# Patient Record
Sex: Male | Born: 1960 | Race: Black or African American | Hispanic: No | Marital: Married | Smoking: Former smoker
Health system: Southern US, Community
[De-identification: ages and names within clinical notes are randomized; demographics above are authoritative.]

## PROBLEM LIST (undated history)

## (undated) DIAGNOSIS — I4719 Other supraventricular tachycardia: Secondary | ICD-10-CM

## (undated) DIAGNOSIS — I471 Supraventricular tachycardia: Secondary | ICD-10-CM

## (undated) DIAGNOSIS — F1410 Cocaine abuse, uncomplicated: Secondary | ICD-10-CM

## (undated) DIAGNOSIS — M1990 Unspecified osteoarthritis, unspecified site: Secondary | ICD-10-CM

## (undated) DIAGNOSIS — I509 Heart failure, unspecified: Secondary | ICD-10-CM

## (undated) DIAGNOSIS — D649 Anemia, unspecified: Secondary | ICD-10-CM

## (undated) DIAGNOSIS — R519 Headache, unspecified: Secondary | ICD-10-CM

## (undated) DIAGNOSIS — F329 Major depressive disorder, single episode, unspecified: Secondary | ICD-10-CM

## (undated) DIAGNOSIS — R1031 Right lower quadrant pain: Secondary | ICD-10-CM

## (undated) DIAGNOSIS — K219 Gastro-esophageal reflux disease without esophagitis: Secondary | ICD-10-CM

## (undated) DIAGNOSIS — Z915 Personal history of self-harm: Secondary | ICD-10-CM

## (undated) DIAGNOSIS — R0602 Shortness of breath: Secondary | ICD-10-CM

## (undated) DIAGNOSIS — F209 Schizophrenia, unspecified: Secondary | ICD-10-CM

## (undated) DIAGNOSIS — F419 Anxiety disorder, unspecified: Secondary | ICD-10-CM

## (undated) DIAGNOSIS — R1032 Left lower quadrant pain: Secondary | ICD-10-CM

## (undated) DIAGNOSIS — F319 Bipolar disorder, unspecified: Secondary | ICD-10-CM

## (undated) DIAGNOSIS — I10 Essential (primary) hypertension: Secondary | ICD-10-CM

## (undated) DIAGNOSIS — I38 Endocarditis, valve unspecified: Secondary | ICD-10-CM

## (undated) DIAGNOSIS — N189 Chronic kidney disease, unspecified: Secondary | ICD-10-CM

## (undated) DIAGNOSIS — R51 Headache: Secondary | ICD-10-CM

## (undated) DIAGNOSIS — K3184 Gastroparesis: Secondary | ICD-10-CM

## (undated) DIAGNOSIS — L299 Pruritus, unspecified: Secondary | ICD-10-CM

## (undated) DIAGNOSIS — R42 Dizziness and giddiness: Secondary | ICD-10-CM

## (undated) DIAGNOSIS — G629 Polyneuropathy, unspecified: Secondary | ICD-10-CM

## (undated) DIAGNOSIS — F32A Depression, unspecified: Secondary | ICD-10-CM

## (undated) DIAGNOSIS — E785 Hyperlipidemia, unspecified: Secondary | ICD-10-CM

## (undated) HISTORY — DX: Headache, unspecified: R51.9

## (undated) HISTORY — DX: Schizophrenia, unspecified: F20.9

## (undated) HISTORY — DX: Bipolar disorder, unspecified: F31.9

## (undated) HISTORY — DX: Headache: R51

## (undated) HISTORY — DX: Cocaine abuse, uncomplicated: F14.10

## (undated) HISTORY — DX: Hyperlipidemia, unspecified: E78.5

## (undated) HISTORY — PX: CARDIAC VALVE REPLACEMENT: SHX585

## (undated) HISTORY — PX: LACERATION REPAIR: SHX5168

## (undated) HISTORY — DX: Essential (primary) hypertension: I10

---

## 1996-11-19 DIAGNOSIS — Z9151 Personal history of suicidal behavior: Secondary | ICD-10-CM

## 1996-11-19 HISTORY — DX: Personal history of suicidal behavior: Z91.51

## 1998-03-31 ENCOUNTER — Emergency Department (HOSPITAL_COMMUNITY): Admission: EM | Admit: 1998-03-31 | Discharge: 1998-03-31 | Payer: Self-pay | Admitting: Emergency Medicine

## 1999-01-24 ENCOUNTER — Emergency Department (HOSPITAL_COMMUNITY): Admission: EM | Admit: 1999-01-24 | Discharge: 1999-01-24 | Payer: Self-pay | Admitting: Emergency Medicine

## 1999-01-24 ENCOUNTER — Encounter: Payer: Self-pay | Admitting: Emergency Medicine

## 1999-02-16 ENCOUNTER — Emergency Department (HOSPITAL_COMMUNITY): Admission: EM | Admit: 1999-02-16 | Discharge: 1999-02-16 | Payer: Self-pay | Admitting: Internal Medicine

## 1999-04-03 ENCOUNTER — Emergency Department (HOSPITAL_COMMUNITY): Admission: EM | Admit: 1999-04-03 | Discharge: 1999-04-04 | Payer: Self-pay | Admitting: Emergency Medicine

## 2002-04-02 ENCOUNTER — Emergency Department (HOSPITAL_COMMUNITY): Admission: EM | Admit: 2002-04-02 | Discharge: 2002-04-02 | Payer: Self-pay | Admitting: Emergency Medicine

## 2002-04-02 ENCOUNTER — Encounter: Payer: Self-pay | Admitting: Emergency Medicine

## 2002-04-08 ENCOUNTER — Encounter: Payer: Self-pay | Admitting: Emergency Medicine

## 2002-04-08 ENCOUNTER — Inpatient Hospital Stay (HOSPITAL_COMMUNITY): Admission: EM | Admit: 2002-04-08 | Discharge: 2002-04-15 | Payer: Self-pay | Admitting: Psychiatry

## 2002-04-28 ENCOUNTER — Inpatient Hospital Stay (HOSPITAL_COMMUNITY): Admission: EM | Admit: 2002-04-28 | Discharge: 2002-04-29 | Payer: Self-pay | Admitting: Emergency Medicine

## 2002-08-15 ENCOUNTER — Emergency Department (HOSPITAL_COMMUNITY): Admission: EM | Admit: 2002-08-15 | Discharge: 2002-08-15 | Payer: Self-pay | Admitting: Emergency Medicine

## 2002-08-20 ENCOUNTER — Encounter: Admission: RE | Admit: 2002-08-20 | Discharge: 2002-08-20 | Payer: Self-pay | Admitting: Internal Medicine

## 2002-08-21 ENCOUNTER — Encounter: Admission: RE | Admit: 2002-08-21 | Discharge: 2002-08-21 | Payer: Self-pay | Admitting: Internal Medicine

## 2002-08-21 ENCOUNTER — Ambulatory Visit (HOSPITAL_COMMUNITY): Admission: RE | Admit: 2002-08-21 | Discharge: 2002-08-21 | Payer: Self-pay | Admitting: Internal Medicine

## 2002-08-24 ENCOUNTER — Encounter: Admission: RE | Admit: 2002-08-24 | Discharge: 2002-08-24 | Payer: Self-pay | Admitting: Internal Medicine

## 2004-01-10 ENCOUNTER — Emergency Department (HOSPITAL_COMMUNITY): Admission: EM | Admit: 2004-01-10 | Discharge: 2004-01-11 | Payer: Self-pay | Admitting: Emergency Medicine

## 2004-04-20 ENCOUNTER — Encounter: Admission: RE | Admit: 2004-04-20 | Discharge: 2004-04-20 | Payer: Self-pay | Admitting: Internal Medicine

## 2005-10-26 ENCOUNTER — Emergency Department (HOSPITAL_COMMUNITY): Admission: EM | Admit: 2005-10-26 | Discharge: 2005-10-26 | Payer: Self-pay | Admitting: Emergency Medicine

## 2005-11-05 ENCOUNTER — Emergency Department (HOSPITAL_COMMUNITY): Admission: EM | Admit: 2005-11-05 | Discharge: 2005-11-05 | Payer: Self-pay | Admitting: Emergency Medicine

## 2007-10-22 ENCOUNTER — Emergency Department (HOSPITAL_COMMUNITY): Admission: EM | Admit: 2007-10-22 | Discharge: 2007-10-22 | Payer: Self-pay | Admitting: Emergency Medicine

## 2007-12-01 ENCOUNTER — Emergency Department (HOSPITAL_COMMUNITY): Admission: EM | Admit: 2007-12-01 | Discharge: 2007-12-01 | Payer: Self-pay | Admitting: Emergency Medicine

## 2009-09-30 ENCOUNTER — Emergency Department (HOSPITAL_COMMUNITY): Admission: EM | Admit: 2009-09-30 | Discharge: 2009-09-30 | Payer: Self-pay | Admitting: Emergency Medicine

## 2010-01-30 ENCOUNTER — Emergency Department (HOSPITAL_COMMUNITY): Admission: EM | Admit: 2010-01-30 | Discharge: 2010-01-30 | Payer: Self-pay | Admitting: Emergency Medicine

## 2010-05-19 HISTORY — PX: MITRAL VALVE REPAIR: SHX2039

## 2010-05-24 ENCOUNTER — Encounter (INDEPENDENT_AMBULATORY_CARE_PROVIDER_SITE_OTHER): Payer: Self-pay | Admitting: Internal Medicine

## 2010-05-24 ENCOUNTER — Ambulatory Visit: Payer: Self-pay | Admitting: Cardiology

## 2010-05-24 ENCOUNTER — Inpatient Hospital Stay (HOSPITAL_COMMUNITY): Admission: EM | Admit: 2010-05-24 | Discharge: 2010-06-03 | Payer: Self-pay | Admitting: Emergency Medicine

## 2010-05-26 ENCOUNTER — Ambulatory Visit: Payer: Self-pay | Admitting: Cardiothoracic Surgery

## 2010-05-26 ENCOUNTER — Encounter: Payer: Self-pay | Admitting: Cardiovascular Disease

## 2010-05-26 ENCOUNTER — Ambulatory Visit: Payer: Self-pay | Admitting: Cardiovascular Disease

## 2010-05-26 HISTORY — PX: CARDIAC CATHETERIZATION: SHX172

## 2010-05-27 ENCOUNTER — Encounter: Payer: Self-pay | Admitting: Cardiothoracic Surgery

## 2010-05-30 ENCOUNTER — Ambulatory Visit: Payer: Self-pay | Admitting: Vascular Surgery

## 2010-05-30 ENCOUNTER — Encounter: Payer: Self-pay | Admitting: Cardiothoracic Surgery

## 2010-05-30 ENCOUNTER — Encounter: Payer: Self-pay | Admitting: Cardiovascular Disease

## 2010-06-03 ENCOUNTER — Encounter: Payer: Self-pay | Admitting: Cardiology

## 2010-06-06 ENCOUNTER — Emergency Department (HOSPITAL_COMMUNITY): Admission: EM | Admit: 2010-06-06 | Discharge: 2010-06-06 | Payer: Self-pay | Admitting: Emergency Medicine

## 2010-06-12 ENCOUNTER — Telehealth: Payer: Self-pay | Admitting: Cardiovascular Disease

## 2010-06-14 DIAGNOSIS — I10 Essential (primary) hypertension: Secondary | ICD-10-CM

## 2010-06-14 DIAGNOSIS — F209 Schizophrenia, unspecified: Secondary | ICD-10-CM | POA: Insufficient documentation

## 2010-06-14 DIAGNOSIS — R011 Cardiac murmur, unspecified: Secondary | ICD-10-CM | POA: Insufficient documentation

## 2010-06-14 DIAGNOSIS — I059 Rheumatic mitral valve disease, unspecified: Secondary | ICD-10-CM | POA: Insufficient documentation

## 2010-06-14 DIAGNOSIS — F319 Bipolar disorder, unspecified: Secondary | ICD-10-CM | POA: Insufficient documentation

## 2010-06-15 ENCOUNTER — Encounter: Payer: Self-pay | Admitting: Cardiovascular Disease

## 2010-06-21 ENCOUNTER — Ambulatory Visit: Payer: Self-pay | Admitting: Cardiothoracic Surgery

## 2010-06-21 ENCOUNTER — Encounter: Admission: RE | Admit: 2010-06-21 | Discharge: 2010-06-21 | Payer: Self-pay | Admitting: Cardiothoracic Surgery

## 2010-06-21 ENCOUNTER — Encounter: Payer: Self-pay | Admitting: Cardiovascular Disease

## 2010-06-22 ENCOUNTER — Encounter: Payer: Self-pay | Admitting: Cardiovascular Disease

## 2010-06-28 ENCOUNTER — Ambulatory Visit: Payer: Self-pay | Admitting: Cardiology

## 2010-06-28 ENCOUNTER — Encounter: Payer: Self-pay | Admitting: Physician Assistant

## 2010-06-28 LAB — CONVERTED CEMR LAB
BUN: 31 mg/dL — ABNORMAL HIGH (ref 6–23)
CO2: 26 meq/L (ref 19–32)
Calcium: 9.1 mg/dL (ref 8.4–10.5)
Chloride: 106 meq/L (ref 96–112)
Creatinine, Ser: 2.5 mg/dL — ABNORMAL HIGH (ref 0.4–1.5)
GFR calc non Af Amer: 35.31 mL/min (ref >60)
Glucose, Bld: 99 mg/dL (ref 70–99)
Potassium: 5.5 meq/L — ABNORMAL HIGH (ref 3.5–5.1)
Sodium: 139 meq/L (ref 135–145)

## 2010-06-29 ENCOUNTER — Ambulatory Visit: Payer: Self-pay | Admitting: Cardiovascular Disease

## 2010-07-07 LAB — CONVERTED CEMR LAB
BUN: 30 mg/dL — ABNORMAL HIGH (ref 6–23)
CO2: 25 meq/L (ref 19–32)
Calcium: 8.6 mg/dL (ref 8.4–10.5)
Chloride: 107 meq/L (ref 96–112)
Creatinine, Ser: 2.2 mg/dL — ABNORMAL HIGH (ref 0.4–1.5)
GFR calc non Af Amer: 41.77 mL/min (ref >60)
Glucose, Bld: 99 mg/dL (ref 70–99)
Potassium: 4.1 meq/L (ref 3.5–5.1)
Sodium: 141 meq/L (ref 135–145)

## 2010-07-19 ENCOUNTER — Ambulatory Visit: Payer: Self-pay | Admitting: Cardiothoracic Surgery

## 2010-07-27 ENCOUNTER — Ambulatory Visit (HOSPITAL_COMMUNITY): Admission: RE | Admit: 2010-07-27 | Discharge: 2010-07-27 | Payer: Self-pay | Admitting: Cardiothoracic Surgery

## 2010-07-27 ENCOUNTER — Ambulatory Visit: Payer: Self-pay | Admitting: Cardiology

## 2010-07-27 ENCOUNTER — Encounter: Payer: Self-pay | Admitting: Cardiothoracic Surgery

## 2010-08-09 ENCOUNTER — Encounter: Payer: Self-pay | Admitting: Cardiovascular Disease

## 2010-08-09 ENCOUNTER — Telehealth (INDEPENDENT_AMBULATORY_CARE_PROVIDER_SITE_OTHER): Payer: Self-pay | Admitting: *Deleted

## 2010-08-09 ENCOUNTER — Ambulatory Visit: Payer: Self-pay | Admitting: Cardiothoracic Surgery

## 2010-08-24 ENCOUNTER — Ambulatory Visit: Payer: Self-pay | Admitting: Cardiovascular Disease

## 2010-09-04 ENCOUNTER — Encounter: Payer: Self-pay | Admitting: Cardiovascular Disease

## 2010-09-11 ENCOUNTER — Encounter: Payer: Self-pay | Admitting: Cardiovascular Disease

## 2010-09-21 ENCOUNTER — Ambulatory Visit: Payer: Self-pay | Admitting: Cardiology

## 2010-09-21 ENCOUNTER — Encounter: Payer: Self-pay | Admitting: Cardiology

## 2010-09-21 ENCOUNTER — Inpatient Hospital Stay (HOSPITAL_COMMUNITY): Admission: EM | Admit: 2010-09-21 | Discharge: 2010-09-23 | Payer: Self-pay | Admitting: Emergency Medicine

## 2010-09-22 ENCOUNTER — Encounter: Payer: Self-pay | Admitting: Cardiovascular Disease

## 2010-09-22 ENCOUNTER — Encounter: Payer: Self-pay | Admitting: Cardiology

## 2010-09-27 ENCOUNTER — Encounter: Payer: Self-pay | Admitting: Cardiovascular Disease

## 2010-10-15 ENCOUNTER — Emergency Department (HOSPITAL_COMMUNITY)
Admission: EM | Admit: 2010-10-15 | Discharge: 2010-10-15 | Payer: Self-pay | Source: Home / Self Care | Admitting: Emergency Medicine

## 2010-10-16 ENCOUNTER — Encounter: Payer: Self-pay | Admitting: Physician Assistant

## 2010-10-16 ENCOUNTER — Ambulatory Visit: Payer: Self-pay | Admitting: Cardiology

## 2010-10-16 DIAGNOSIS — Z9189 Other specified personal risk factors, not elsewhere classified: Secondary | ICD-10-CM | POA: Insufficient documentation

## 2010-10-16 DIAGNOSIS — N039 Chronic nephritic syndrome with unspecified morphologic changes: Secondary | ICD-10-CM

## 2010-10-16 DIAGNOSIS — D631 Anemia in chronic kidney disease: Secondary | ICD-10-CM | POA: Insufficient documentation

## 2010-10-16 DIAGNOSIS — I5033 Acute on chronic diastolic (congestive) heart failure: Secondary | ICD-10-CM

## 2010-10-16 DIAGNOSIS — R319 Hematuria, unspecified: Secondary | ICD-10-CM

## 2010-10-25 ENCOUNTER — Ambulatory Visit: Payer: Self-pay | Admitting: Cardiothoracic Surgery

## 2010-10-26 ENCOUNTER — Emergency Department (HOSPITAL_COMMUNITY): Admission: EM | Admit: 2010-10-26 | Discharge: 2010-03-06 | Payer: Self-pay | Admitting: Emergency Medicine

## 2010-11-08 ENCOUNTER — Ambulatory Visit: Payer: Self-pay | Admitting: Cardiovascular Disease

## 2010-11-15 ENCOUNTER — Encounter: Payer: Self-pay | Admitting: Cardiovascular Disease

## 2010-11-15 ENCOUNTER — Ambulatory Visit: Payer: Self-pay | Admitting: Cardiothoracic Surgery

## 2010-11-16 ENCOUNTER — Ambulatory Visit: Payer: Self-pay | Admitting: Cardiovascular Disease

## 2010-11-22 ENCOUNTER — Encounter (HOSPITAL_COMMUNITY)
Admission: RE | Admit: 2010-11-22 | Discharge: 2010-12-19 | Payer: Self-pay | Source: Home / Self Care | Attending: Nephrology | Admitting: Nephrology

## 2010-11-22 LAB — POCT HEMOGLOBIN-HEMACUE: Hemoglobin: 8.2 g/dL — ABNORMAL LOW (ref 13.0–17.0)

## 2010-12-05 ENCOUNTER — Encounter: Payer: Self-pay | Admitting: Cardiovascular Disease

## 2010-12-06 LAB — POCT HEMOGLOBIN-HEMACUE: Hemoglobin: 8.7 g/dL — ABNORMAL LOW (ref 13.0–17.0)

## 2010-12-18 ENCOUNTER — Encounter: Payer: Self-pay | Admitting: Cardiology

## 2010-12-21 NOTE — Progress Notes (Signed)
Summary: Questions about pain medications  Phone Note Call from Patient Call back at [phone]   Caller: Spouse/Karen Summary of Call: Pt calling to speak with someone about pain medication Initial call taken by: Judie Grieve,  June 12, 2010 8:53 AM  Follow-up for Phone Call        PT'S WIFE INSTRUCTED TO CALL SURGEON'S OFF FOR PAIN MED REFILL. Follow-up by: Scherrie Bateman, LPN,  June 12, 2010 9:22 AM

## 2010-12-21 NOTE — Letter (Signed)
Summary: [facility] Visit   [facility] Visit   Imported By: Roderic Ovens 09/05/2010 11:24:29  _____________________________________________________________________  External Attachment:    Type:   Image     Comment:   External Document

## 2010-12-21 NOTE — Assessment & Plan Note (Signed)
Summary: eph per night message/dx:chf/mvr/lg   Visit Type:  Follow-up Primary Rhiley Tarver:  None  CC:  shortness of breath.  History of Present Illness: This is a 50 year old African American male patient who has history of chest pain in the setting of cocaine abuse. He also had an admission November 3 through the fifth 2001 with acute on chronic diastolic heart failure and acute on chronic renal failure for which renal saw him. He was transfused one unit of blood for chronic anemia and was sent home for outpatient renal followup. He has not been seen yet by them.  The patient went to [facility] emergency room yesterday with cough, congestion and blood in his urine. They wanted to admit him for transfusion and renal evaluation but he signed out AMA.    The patient is quite weak he gets short of breath if he goes up a flight of stairs and has to stop before he continues on. He denies orthopnea paroxysmal nocturnal dyspnea, dizziness or presyncope. His hemoglobin in the emergency room was 6.8 hematocrit 19, BUN 45, creatinine 4.01 which was improved from 4.9. His BNP was elevated at 1250. They started him on tetracycline. He says his cough and hematuria has improved.    Current Medications (verified): 1)  Aspirin 81 Mg Tbec (Aspirin) .... Take One Tablet By Mouth Daily 2)  Metoprolol Tartrate 50 Mg Tabs (Metoprolol Tartrate) .... Take One Tablet By Mouth Twice A Day 3)  Multivitamins  Tabs (Multiple Vitamin) .... Take 1 Tablet By Mouth Once A Day 4)  Amlodipine Besylate 5 Mg Tabs (Amlodipine Besylate) .... Take One Tablet By Mouth Daily 5)  Hydralazine Hcl 10 Mg Tabs (Hydralazine Hcl) .... Take One Tablet By Mouth Three Times A Day 6)  Furosemide 40 Mg Tabs (Furosemide) .... Take One Tablet By Mouth Daily. 7)  Abilify 5 Mg Tabs (Aripiprazole) .... Take 1 Tablet By Mouth Once A Day 8)  Fish Oil   Oil (Fish Oil) .... Two Times A Day 9)  Vitamin C 500 Mg  Tabs (Ascorbic Acid) .... Take 1 Tablet  By Mouth Once A Day 10)  Doxycycline Hyclate 100 Mg Caps (Doxycycline Hyclate) .... Take 1 Tablet By Mouth Two Times A Day  Allergies (verified): 1)  ! * Strawberries  Past History:  Past Medical History: Last updated: 08/24/2010 1. Hypertension.   2. Bipolar.   3. Schizophrenia, with history of hospitalized inpatient therapy at       the [facility].   4. History of mitral valve prolapse with severe MR    Family History: Last updated: 06/14/2010 Is negative for valvular heart disease or coronary   artery disease.   Review of Systems       see history of present illness.denies any recurrent cocaine or drug abuse  Vital Signs:  Patient profile:   50 year old male Height:      66 inches Weight:      147 pounds BMI:     23.81 Pulse rate:   71 / minute BP sitting:   130 / 86  (left arm) Cuff size:   regular  Vitals Entered By: Hardin Negus, RMA (October 16, 2010 10:22 AM)  Physical Exam  General:  Thin, in no acute distress. Neck: Increased JVD, no HJR, Bruit, or thyroid enlargement Lungs: Decreased breaths sounds throughout,No tachypnea, clear without wheezing, rales, or rhonchi Cardiovascular: RRR, PMI not displaced, 2-3/6 systolic murmur at the left sternal border and apex, no bruit, thrill, or heave.  Abdomen: BS normal. Soft without organomegaly, masses, lesions or tenderness. Extremities: left groin without hematoma hemorrhage, without cyanosis, clubbing or edema. Good distal pulses bilateral SKin: Warm, no lesions or rashes  Musculoskeletal: No deformities Neuro: no focal signs    Impression & Recommendations:  Problem # 1:  ACUTE ON CHRONIC DIASTOLIC HEART FAILURE (ICD-428.33) Pt does have some fluid build up today his BNP was 1250 yesterday at [facility]. His lungs are clear but his neck veins are elevated. I asked him to take an extra Lasix today. The following medications were removed from the medication list:    Lisinopril 20 Mg Tabs  (Lisinopril) .Marland Kitchen... Take one tablet by mouth daily    Hydrochlorothiazide 25 Mg Tabs (Hydrochlorothiazide) .Marland Kitchen... Take one tablet by mouth daily. His updated medication list for this problem includes:    Aspirin 81 Mg Tbec (Aspirin) .Marland Kitchen... Take one tablet by mouth daily    Metoprolol Tartrate 50 Mg Tabs (Metoprolol tartrate) .Marland Kitchen... Take one tablet by mouth twice a day    Amlodipine Besylate 5 Mg Tabs (Amlodipine besylate) .Marland Kitchen... Take one tablet by mouth daily    Furosemide 40 Mg Tabs (Furosemide) .Marland Kitchen... Take one tablet by mouth daily.  Problem # 2:  ANEMIA IN CHRONIC KIDNEY DISEASE (ICD-285.21) Patient's hemoglobin was 6.8 yesterday. It was 7.0 when he left the hospital after one unit of blood transfusion. He is quite weak but denies any dizziness or orthostatic symptoms. I offered him hospitalization for transfusion and treatment of his heart failure and kidney disease. He would like to see Dr. Arrie Aran first.I spoke with his office and he was beat with him this afternoon to give him an appointment.  Problem # 3:  HEMATURIA UNSPECIFIED (ICD-599.70) Patient had hematuria on urinalysis yesterday as well as mucus in his urine. He was started on tetracycline  Problem # 4:  COCAINE ABUSE, HX OF (ICD-V15.9)  Patient had chest pain in setting of recent cocaine abuse September 29, 2010. He denies any further cocaine use.  Problem # 5:  MITRAL VALVE DISORDERS (ICD-424.0) Patient had valvular heart disease status post mitral valve ring in July 2001 with persistent moderate mitral regurgitation by TEE September 23, 2010 The following medications were removed from the medication list:    Lisinopril 20 Mg Tabs (Lisinopril) .Marland Kitchen... Take one tablet by mouth daily    Hydrochlorothiazide 25 Mg Tabs (Hydrochlorothiazide) .Marland Kitchen... Take one tablet by mouth daily. His updated medication list for this problem includes:    Metoprolol Tartrate 50 Mg Tabs (Metoprolol tartrate) .Marland Kitchen... Take one tablet by mouth twice a day     Furosemide 40 Mg Tabs (Furosemide) .Marland Kitchen... Take one tablet by mouth daily.  Other Orders: EKG w/ Interpretation (93000)  Patient Instructions: 1)  Please take an extra Lasix 40mg  today 2)  Your physician recommends that you schedule a follow-up appointment in: 1 month with Dr.Cooper 3)  Dr. Hadley Pen office will call you today with an appointment  4)  [phone]

## 2010-12-21 NOTE — Miscellaneous (Signed)
Summary: [facility]   [facility]   Imported By: Roderic Ovens 11/07/2010 12:00:38  _____________________________________________________________________  External Attachment:    Type:   Image     Comment:   External Document

## 2010-12-21 NOTE — Letter (Signed)
Summary: [facility] Visit   [facility] Visit   Imported By: Roderic Ovens 08/04/2010 16:13:53  _____________________________________________________________________  External Attachment:    Type:   Image     Comment:   External Document

## 2010-12-21 NOTE — Miscellaneous (Signed)
Summary: [facility] Order[facility] Plan   [facility] Order[facility] Plan   Imported By: Roderic Ovens 06/16/2010 11:11:14  _____________________________________________________________________  External Attachment:    Type:   Image     Comment:   External Document

## 2010-12-21 NOTE — Assessment & Plan Note (Signed)
Summary: 2 month rov   Visit Type:  2 months follow up Primary Provider:  None  CC:  Some Sob.  History of Present Illness: This is a 50 year old African American male patient who underwent mitral valve repair with a 26 mm Edwards annuloplasty ring and release of posterior leaflet secondary to cords and closure of the cleft in the posterior leaflet May 30, 2010. He presented with congestive heart failure. Since surgery, his symptoms are much improved. He complains of exertional dyspnea with climbing stairs, but no symptoms with walking on level ground. He denies orthopnea or PND. No edema or other complaints at present.  He has been noted to have a residual MR murmur following surgery and post-op echo done September 8th showed at least moderate MR with an LVEF of 55%.  Current Medications (verified): 1)  Aspirin 81 Mg Tbec (Aspirin) .... Take One Tablet By Mouth Daily 2)  Guaifenesin 100 Mg/25ml Syrp (Guaifenesin) .... As Needed 3)  Metoprolol Tartrate 50 Mg Tabs (Metoprolol Tartrate) .... Take One Tablet By Mouth Twice A Day 4)  Multivitamins  Tabs (Multiple Vitamin) .... Take 1 Tablet By Mouth Once A Day 5)  Oxycodone Hcl 5 Mg Tabs (Oxycodone Hcl) .... As Needed 6)  Risperdal 3 Mg Tabs (Risperidone) .... Take 1 Tab By Mouth At Bedtime 7)  Trazamine 50 Mg Misc (Trazodone & Diet Manage Prod) .... As Needed 8)  Lisinopril 20 Mg Tabs (Lisinopril) .... Take One Tablet By Mouth Daily  Allergies: 1)  ! * Strawberries  Past History:  Past medical history reviewed for relevance to current acute and chronic problems.  Past Medical History: 1. Hypertension.   2. Bipolar.   3. Schizophrenia, with history of hospitalized inpatient therapy at       the [facility].   4. History of mitral valve prolapse with severe MR    Past Surgical History: Mitral Valve repair 2011     Review of Systems       Negative except as per HPI   Vital Signs:  Patient profile:   50 year old  male Height:      66 inches Weight:      146.75 pounds BMI:     23.77 Pulse rate:   76 / minute Pulse rhythm:   regular Resp:     18 per minute BP sitting:   150 / 100  (left arm) Cuff size:   large  Vitals Entered By: Vikki Ports (August 24, 2010 10:37 AM)  Physical Exam  General:  Pt is alert and oriented, in no acute distress. HEENT: normal Neck: normal carotid upstrokes without bruits, JVP normal Lungs: CTA CV: RRR with 2/6 holosystolic murmur at the LV apex Abd: soft, NT, positive BS, no bruit, no organomegaly Ext: no clubbing, cyanosis, or edema. peripheral pulses 2+ and equal Skin: warm and dry without rash    EKG  Procedure date:  07/27/2010  Findings:      Study Conclusions            - Left ventricle: The cavity size was normal. There was mild       concentric hypertrophy. Systolic function was normal. The       estimated ejection fraction was in the range of 50% to 55%. Wall       motion was normal; there were no regional wall motion       abnormalities. Doppler parameters are consistent with high       ventricular filling  pressure.     - Mitral valve: Prior procedures included surgical repair. Moderate       to severe regurgitation.     - Left atrium: The atrium was moderately dilated.     - Pericardium, extracardiac: A trivial pericardial effusion was       identified.     Impressions:            - At least moderate MR (difficult to quantitate); no significant MS       by pressure half time but elevated mean gradient of 13 mmHg);       suggest TEE to better assess.  Impression & Recommendations:  Problem # 1:  MITRAL VALVE DISORDERS (ICD-424.0) The pt is s/p MV repair with apparent significant residual MR (at least moderate). His physical exam is supportive of this as well. Will add HCTZ to improve BP control. He is not taking metoprolol as directed and he was instructed to take this twice daily instead of once daily. Hopefully we will achieve  better BP control with these changes. He now has NYHA Class 2 symptoms and clearly is better than he was before surgery. Will consider a TEE if his symptoms progress or if his physical exam changes.   The following medications were removed from the medication list:    Furosemide 40 Mg Tabs (Furosemide) .Marland Kitchen... Take one tablet by mouth daily. His updated medication list for this problem includes:    Metoprolol Tartrate 50 Mg Tabs (Metoprolol tartrate) .Marland Kitchen... Take one tablet by mouth twice a day    Lisinopril 20 Mg Tabs (Lisinopril) .Marland Kitchen... Take one tablet by mouth daily    Hydrochlorothiazide 25 Mg Tabs (Hydrochlorothiazide) .Marland Kitchen... Take one tablet by mouth daily.  Problem # 2:  HYPERTENSION (ICD-401.9) Poor control. Changes as above. Will f/u with a metabolic panel when he follows up in one month.  The following medications were removed from the medication list:    Furosemide 40 Mg Tabs (Furosemide) .Marland Kitchen... Take one tablet by mouth daily. His updated medication list for this problem includes:    Aspirin 81 Mg Tbec (Aspirin) .Marland Kitchen... Take one tablet by mouth daily    Metoprolol Tartrate 50 Mg Tabs (Metoprolol tartrate) .Marland Kitchen... Take one tablet by mouth twice a day    Lisinopril 20 Mg Tabs (Lisinopril) .Marland Kitchen... Take one tablet by mouth daily    Hydrochlorothiazide 25 Mg Tabs (Hydrochlorothiazide) .Marland Kitchen... Take one tablet by mouth daily.  Patient Instructions: 1)  Your physician recommends that you schedule a follow-up appointment in: 1 MONTH 2)  Your physician recommends that you return for lab work in: 1 MONTH (BMP 401.9, 424.0) 3)  Your physician has recommended you make the following change in your medication: START HCTZ 25mg  one tablet daily Prescriptions: HYDROCHLOROTHIAZIDE 25 MG TABS (HYDROCHLOROTHIAZIDE) Take one tablet by mouth daily.  #30 x 6   Entered by:   Julieta Gutting, RN, BSN   Authorized by:   Norva Karvonen, MD   Signed by:   Julieta Gutting, RN, BSN on 08/24/2010   Method used:    Electronically to        [id number]* (retail)       [address] [place], [place]  [address]       Ph: [phone]       Fax: [phone]   RxID:   [id number]

## 2010-12-21 NOTE — Letter (Signed)
[facility]   Imported By: Marylou Mccoy 10/10/2010 14:50:51  _____________________________________________________________________  External Attachment:    Type:   Image     Comment:   External Document

## 2010-12-21 NOTE — Progress Notes (Signed)
Summary: refill request  Phone Note Refill Request Message from:  Patient on August 09, 2010 11:44 AM  Refills Requested: Medication #1:  FOLIC ACID 1 MG TABS Take 1 tablet by mouth once a day [facility] [address]   Method Requested: Telephone to Pharmacy Initial call taken by: Glynda Jaeger,  August 09, 2010 11:45 AM  Follow-up for Phone Call        Rx faxed to pharmacy.Vikki Ports  August 10, 2010 2:46 PM      Prescriptions: FOLIC ACID 1 MG TABS (FOLIC ACID) Take 1 tablet by mouth once a day  #30 x 1   Entered by:   Vikki Ports   Authorized by:   Norva Karvonen, MD   Signed by:   Vikki Ports on 08/10/2010   Method used:   Faxed to ...       [facility] [address] #[id number]* (retail)       [address] [place], [place]  [address]       Ph: [phone]       Fax: [phone]   RxID:   [id number]

## 2010-12-21 NOTE — Assessment & Plan Note (Signed)
Summary: eph/ gd   Visit Type:  Post-hospital  CC:  no complains.  History of Present Illness: This is a 50 year old African American male patient who underwent mitral valve repair with a 26 mm Edwards annuloplasty ring and release of posterior leaflet secondary to cords and closure of the cleft in the posterior leaflet May 30, 2010. The patient had been having symptoms of congestive heart failure prior to this. Intraoperative transesophageal echo revealed slightly depressed function of the septum which improved after discontinuation of bypass. No segmental defects were noted. There was a trace to 1+ regurgitation flow and a central pattern.  The patient is doing quite well post surgery. He says his breathing is back to normal. He denies dyspnea dyspnea on exertion orthopnea paroxysmal nocturnal dyspnea. He is having mild discomfort in his chest from the surgery. He saw Dr. Morton Peters last week and was started on lisinopril for elevated blood pressure.  Current Medications (verified): 1)  Aspirin 81 Mg Tbec (Aspirin) .... Take One Tablet By Mouth Daily 2)  Folic Acid 1 Mg Tabs (Folic Acid) .... Take 1 Tablet By Mouth Once A Day 3)  Furosemide 40 Mg Tabs (Furosemide) .... Take One Tablet By Mouth Daily. 4)  Guaifenesin 100 Mg/36ml Syrp (Guaifenesin) .... As Needed 5)  Nu-Iron 150 Mg Caps (Polysaccharide Iron Complex) .... Take 1 Capsule By Mouth Once A Day 6)  Metoprolol Tartrate 50 Mg Tabs (Metoprolol Tartrate) .... Take One Tablet By Mouth Twice A Day 7)  Multivitamins  Tabs (Multiple Vitamin) .... Take 1 Tablet By Mouth Once A Day 8)  Oxycodone Hcl 5 Mg Tabs (Oxycodone Hcl) .... As Needed 9)  Potassium Chloride Crys Cr 20 Meq Cr-Tabs (Potassium Chloride Crys Cr) .... Take One Tablet By Mouth Daily 10)  Risperdal 3 Mg Tabs (Risperidone) .... Take 1 Tab By Mouth At Bedtime 11)  Trazamine 50 Mg Misc (Trazodone & Diet Manage Prod) .... As Needed 12)  Lisinopril 10 Mg Tabs (Lisinopril) .... Take  One Tablet By Mouth Daily  Allergies (verified): 1)  ! * Strawberries  Past History:  Past Medical History: Last updated: 06/14/2010 1. Hypertension.   2. Bipolar.   3. Schizophrenia, with history of hospitalized inpatient therapy at       the [facility].   4. History of mitral valve prolapse.   5. He had a cardiac murmur.   Review of Systems       see history of present illness. Patient is walking with a cane secondary to his chest pain  Vital Signs:  Patient profile:   50 year old male Height:      66 inches Weight:      145.75 pounds BMI:     23.61 Pulse rate:   77 / minute Pulse rhythm:   regular Resp:     18 per minute BP sitting:   160 / 90  (left arm) Cuff size:   large  Vitals Entered By: Vikki Ports (June 28, 2010 9:24 AM)  Physical Exam  General:   Well-nournished, in no acute distress. Neck: No JVD, HJR, Bruit, or thyroid enlargement Lungs: No tachypnea, clear without wheezing, rales, or rhonchi Cardiovascular:Incisions healing well RRR, PMI not displaced, 2/6 systolic murmur at the left sternal border and apex, no gallops, bruit, thrill, or heave. Abdomen: BS normal. Soft without organomegaly, masses, lesions or tenderness. Extremities: without cyanosis, clubbing or edema. Good distal pulses bilateral SKin: Warm, no lesions or rashes  Musculoskeletal: No deformities Neuro: no focal  signs    EKG  Procedure date:  06/28/2010  Findings:      normal sinus rhythm with LVH  Impression & Recommendations:  Problem # 1:  MITRAL VALVE PROLAPSE (ICD-424.0) Patient underwent mitral valve repair May 30, 2010 for severe mitral regurgitation class IV congestive heart failure. Patient is feeling 100% better. He has no symptoms of heart failure. He does have a systolic murmur. We may want to do a repeat echo when he sees Dr. Excell Seltzer back. I will hold off for now because he is asymptomatic.Patient would like to be considered for cardiac  rehabilitation but he has no insurance. I have made the referral. His updated medication list for this problem includes:    Furosemide 40 Mg Tabs (Furosemide) .Marland Kitchen... Take one tablet by mouth daily.    Metoprolol Tartrate 50 Mg Tabs (Metoprolol tartrate) .Marland Kitchen... Take one tablet by mouth twice a day    Lisinopril 20 Mg Tabs (Lisinopril) .Marland Kitchen... Take one tablet by mouth daily  Orders: EKG w/ Interpretation (93000) TLB-BMP (Basic Metabolic Panel-BMET) (80048-METABOL) Cardiac Rehabilitation (Cardiac Rehab)  Problem # 2:  HYPERTENSION (ICD-401.9) Blood pressure is still elevated today. I will increase his lisinopril to 20 mg daily. I considered stopping his Lasix, but will continue this for better blood pressure control. We will check a BMET today. His updated medication list for this problem includes:    Aspirin 81 Mg Tbec (Aspirin) .Marland Kitchen... Take one tablet by mouth daily    Furosemide 40 Mg Tabs (Furosemide) .Marland Kitchen... Take one tablet by mouth daily.    Metoprolol Tartrate 50 Mg Tabs (Metoprolol tartrate) .Marland Kitchen... Take one tablet by mouth twice a day    Lisinopril 20 Mg Tabs (Lisinopril) .Marland Kitchen... Take one tablet by mouth daily  Patient Instructions: 1)  Your physician recommends that you schedule a follow-up appointment in: 2 months with Dr. Excell Seltzer 2)  Your physician recommends referral and attendance at a Cardiac Rehab Program. Mitral valve repair. 3)  Your physician has recommended you make the following change in your medication: Lisinopril 20 mg take one tablet daily. 4)  Your physician recommends that you return for lab work [id number] today. Prescriptions: LISINOPRIL 20 MG TABS (LISINOPRIL) take one tablet by mouth daily  #30 x 6   Entered by:   Ollen Gross, RN, BSN   Authorized by:   Marletta Lor, PA-C   Signed by:   Ollen Gross, RN, BSN on 06/28/2010   Method used:   Electronically to        [id number]* (retail)       [address] [place], [place]  [address]        Ph: [phone]       Fax: [phone]   RxID:   [id number]

## 2010-12-21 NOTE — Letter (Signed)
Summary: [facility]   [facility]   Imported By: Roderic Ovens 08/04/2010 16:12:26  _____________________________________________________________________  External Attachment:    Type:   Image     Comment:   External Document

## 2010-12-21 NOTE — Letter (Signed)
Summary: [facility]  [facility]   Imported By: Marylou Mccoy 11/09/2010 11:53:07  _____________________________________________________________________  External Attachment:    Type:   Image     Comment:   External Document

## 2010-12-21 NOTE — Consult Note (Signed)
Summary: [facility] Referral Form   [facility] Referral Form   Imported By: Roderic Ovens 10/09/2010 15:05:26  _____________________________________________________________________  External Attachment:    Type:   Image     Comment:   External Document

## 2010-12-26 ENCOUNTER — Emergency Department (HOSPITAL_COMMUNITY)
Admission: EM | Admit: 2010-12-26 | Discharge: 2010-12-26 | Disposition: A | Discharge status: Home / Self Care | Payer: No Typology Code available for payment source | Attending: Emergency Medicine | Admitting: Emergency Medicine

## 2010-12-26 ENCOUNTER — Emergency Department (HOSPITAL_COMMUNITY): Payer: Self-pay

## 2010-12-26 DIAGNOSIS — M545 Low back pain, unspecified: Secondary | ICD-10-CM | POA: Insufficient documentation

## 2010-12-26 DIAGNOSIS — I10 Essential (primary) hypertension: Secondary | ICD-10-CM | POA: Insufficient documentation

## 2010-12-26 DIAGNOSIS — F319 Bipolar disorder, unspecified: Secondary | ICD-10-CM | POA: Insufficient documentation

## 2010-12-26 DIAGNOSIS — F17200 Nicotine dependence, unspecified, uncomplicated: Secondary | ICD-10-CM | POA: Insufficient documentation

## 2010-12-26 DIAGNOSIS — M542 Cervicalgia: Secondary | ICD-10-CM | POA: Insufficient documentation

## 2010-12-26 DIAGNOSIS — Y92410 Unspecified street and highway as the place of occurrence of the external cause: Secondary | ICD-10-CM | POA: Insufficient documentation

## 2011-01-04 ENCOUNTER — Emergency Department (HOSPITAL_COMMUNITY): Payer: Self-pay

## 2011-01-04 ENCOUNTER — Inpatient Hospital Stay (HOSPITAL_COMMUNITY)
Admission: EM | Admit: 2011-01-04 | Discharge: 2011-01-06 | DRG: 189 | Disposition: A | Discharge status: Home / Self Care | Payer: Self-pay | Attending: Internal Medicine | Admitting: Internal Medicine

## 2011-01-04 DIAGNOSIS — F17200 Nicotine dependence, unspecified, uncomplicated: Secondary | ICD-10-CM | POA: Diagnosis present

## 2011-01-04 DIAGNOSIS — J9819 Other pulmonary collapse: Secondary | ICD-10-CM | POA: Diagnosis present

## 2011-01-04 DIAGNOSIS — Z7982 Long term (current) use of aspirin: Secondary | ICD-10-CM

## 2011-01-04 DIAGNOSIS — F209 Schizophrenia, unspecified: Secondary | ICD-10-CM | POA: Diagnosis present

## 2011-01-04 DIAGNOSIS — Z9119 Patient's noncompliance with other medical treatment and regimen: Secondary | ICD-10-CM

## 2011-01-04 DIAGNOSIS — Z91199 Patient's noncompliance with other medical treatment and regimen due to unspecified reason: Secondary | ICD-10-CM

## 2011-01-04 DIAGNOSIS — F1410 Cocaine abuse, uncomplicated: Secondary | ICD-10-CM | POA: Diagnosis present

## 2011-01-04 DIAGNOSIS — J810 Acute pulmonary edema: Principal | ICD-10-CM | POA: Diagnosis present

## 2011-01-04 DIAGNOSIS — F1910 Other psychoactive substance abuse, uncomplicated: Secondary | ICD-10-CM | POA: Diagnosis present

## 2011-01-04 DIAGNOSIS — I517 Cardiomegaly: Secondary | ICD-10-CM | POA: Diagnosis present

## 2011-01-04 DIAGNOSIS — I129 Hypertensive chronic kidney disease with stage 1 through stage 4 chronic kidney disease, or unspecified chronic kidney disease: Secondary | ICD-10-CM | POA: Diagnosis present

## 2011-01-04 DIAGNOSIS — F319 Bipolar disorder, unspecified: Secondary | ICD-10-CM | POA: Diagnosis present

## 2011-01-04 DIAGNOSIS — N184 Chronic kidney disease, stage 4 (severe): Secondary | ICD-10-CM | POA: Diagnosis present

## 2011-01-04 LAB — POCT CARDIAC MARKERS
CKMB, poc: <1 ng/mL — ABNORMAL LOW (ref 1.0–8.0)
Myoglobin, poc: 174 ng/mL (ref 12–200)

## 2011-01-04 LAB — CBC
MCH: 29.5 pg (ref 26.0–34.0)
MCV: 90.5 fL (ref 78.0–100.0)
Platelets: 180 10*3/uL (ref 150–400)
RDW: 20.2 % — ABNORMAL HIGH (ref 11.5–15.5)

## 2011-01-04 LAB — CK TOTAL AND CKMB (NOT AT ARMC)
CK, MB: 2.5 ng/mL (ref 0.3–4.0)
Total CK: 339 U/L — ABNORMAL HIGH (ref 7–232)

## 2011-01-04 LAB — BRAIN NATRIURETIC PEPTIDE: Pro B Natriuretic peptide (BNP): 1840 pg/mL — ABNORMAL HIGH (ref 0.0–100.0)

## 2011-01-04 LAB — TROPONIN I: Troponin I: 0.04 ng/mL (ref 0.00–0.06)

## 2011-01-04 LAB — RAPID URINE DRUG SCREEN, HOSP PERFORMED: Benzodiazepines: NOT DETECTED

## 2011-01-04 LAB — MRSA PCR SCREENING: MRSA by PCR: NEGATIVE

## 2011-01-04 LAB — BASIC METABOLIC PANEL
BUN: 44 mg/dL — ABNORMAL HIGH (ref 6–23)
Creatinine, Ser: 3.9 mg/dL — ABNORMAL HIGH (ref 0.4–1.5)
GFR calc non Af Amer: 17 mL/min — ABNORMAL LOW (ref >60)

## 2011-01-04 LAB — CARDIAC PANEL(CRET KIN+CKTOT+MB+TROPI): Relative Index: 0.9 (ref 0.0–2.5)

## 2011-01-04 LAB — DIFFERENTIAL
Basophils Absolute: 0.1 10*3/uL (ref 0.0–0.1)
Basophils Relative: 1 % (ref 0–1)
Lymphocytes Relative: 9 % — ABNORMAL LOW (ref 12–46)
Monocytes Absolute: 0.8 10*3/uL (ref 0.1–1.0)
Neutro Abs: 6.4 10*3/uL (ref 1.7–7.7)
Neutrophils Relative %: 74 % (ref 43–77)

## 2011-01-04 NOTE — H&P (Signed)
Ryan Winters, Ryan Winters             ACCOUNT NO.:  [id number]  MEDICAL RECORD NO.:  [id number]           [place] TYPE:  E  LOCATION:  [facility]                         FACILITY:  [facility]  PHYSICIAN:  Charlestine Massed, MDDATE OF BIRTH:  11-03-1961  DATE OF ADMISSION:  01/04/2011 DATE OF DISCHARGE:                             HISTORY & PHYSICAL   PRIMARY CARE PHYSICIAN:  Mina Marble, MD  CARDIOLOGIST:  Noralyn Pick. Eden Emms, MD, [facility].  CARDIOTHORACIC SURGERY:  [name], MD  CHIEF COMPLAINT:  Shortness of breath worsening over Ryan past few days.  HISTORY OF PRESENT ILLNESS:  Ryan Winters is a 50 year old gentleman who had mitral valve annuloplasty on May 31, 2010 - mitral valve repair by Dr. Maudie Flakes of [facility] for significant mitral regurgitation. Ryan Winters is well known to have history of cocaine abuse and in Ryan last 2 admissions, his urine test has proved positive to be cocaine, but Ryan Winters falsifies his statements about his cocaine use and he says he has not used it in a long time.  He has known history of schizophrenia and bipolar disorder.  Ryan Winters states over Ryan last 5 days ago, he started having some congestion, cough with yellow sputum at which time he stopped taking his hypertension drugs and then he has been getting worsening shortness of breath.  When I asked him about, did he use cocaine, he did not answer anything.  It was very suspicious that Ryan Winters may be using cocaine which possibly could have triggered Ryan hypertensive crisis which he came in.  When he came, Ryan blood pressure was pretty high.  When he came, his blood pressure was 180/130 in Ryan emergency room with a heart rate of 102 also.  His heart rate was also slightly high.  Following that he was started on medications.  He was given labetalol push after Ryan blood pressure came down.  I requested Ryan ED physician to start nitroglycerin drip, but before that Ryan blood pressure came  down considerably.  Currently his blood pressure is running in 141/99 with a heart rate of 78.  He is currently very stable. His shortness of breath has improved as per him.  Even though Ryan Winters initially refused to stay, he later on considered and said that he will stay in Ryan hospital.  [facility] consulted to admit for hypertensive emergency with shortness of breath possibly due to pulmonary edema.  PAST HISTORY: 1. Mitral valve annuloplasty/repair in July 2011 for severe MR -- no     need for anticoagulation. 2. History of schizophrenia/bipolar disorder. 3. Polysubstance abuse and recurrent repeated cocaine abuser. 4. Hypertension. 5. Chronic kidney disease, stage IV.  ADMISSIONS:  Cardiac cath in July 2011 showing clear cors with no significant disease anywhere.  CURRENT MEDICATIONS:  Ryan Winters says he does not remember any of his medications but as per Ryan discharge summary in December 2011 by Dr. Maudie Flakes, he was on metoprolol, Risperdal, trazodone, folic acid, aspirin, Niferex, guaifenesin.  SOCIAL HISTORY:  Lives with his wife.  He uses cocaine repeatedly, smokes cigarettes continuously even now with 1-2 a day as per him  now. He lives with his wife.  He falsifies statements about his cocaine use, but has been tested positive for cocaine in Ryan last 2 visits.  FAMILY HISTORY:  No significant history of coronary artery disease, valvular heart disease, or diabetes.  ALLERGIES:  STRAWBERRIES, CASHEWS.  No drug allergies.  REVIEW OF SYSTEMS:  A 12 point system review unremarkable.  Positive pertinent features in history of present illness, negative otherwise.  PHYSICAL EXAMINATION:  VITAL SIGNS: Currently 141/99, heart rate 78, respirations range from 20-22, blood pressure is 141/98, temperature is 97.3, O2 sats are 99% on 2 liters oxygen. GENERAL:  Ryan Winters is alert, awake, answered questions well, not in any obvious distress.  Not significantly  tachypneic. HEAD AND NECK:  There is no JVD seen.  No bruits heard in Ryan neck.  No nodes.  But there is increased swelling in Ryan neck veins with hepatic pressure.  No bleeding in oral or nasal mucosa. CHEST:  Bilateral air entry is good.  Harsh breath sounds posteriorly heard in both sides.  Minimal crackles heard posteriorly on both sides, more on Ryan right. CARDIAC:  S1, S2 heard.  There is a holosystolic murmur in Ryan mitral area radiating to Ryan axilla, grade 3/6, no thrills felt. ABDOMEN:  Soft, nontender.  No organomegaly. EXTREMITIES:  No pedal edema. CNS:  No significant motor or sensory deficits.  Speech and cognition are intact. PSYCH:  No acute hallucinations or delusions, not agitated.  RADIOLOGIC DATA:  Chest x-ray shows no acute cardiopulmonary disease. Some chronic bronchitis issues are noted.  No significant fluid in Ryan chest seen.  EKG when compared to Ryan EKG done in November, no new changes seen, normal axis, borderline voltage criteria for left ventricular hypertrophy with some borderline LV strain, pattern is also seen.  No ST-segment or T-wave changes of acute ischemia seen.  LABORATORY DATA:  CBC:  WBC 8.3, hemoglobin 8.7, hematocrit 26.3, and platelets are 180,000, neutrophils 74%.  BMP:  Sodium 140, potassium 4.0, chloride 111, bicarb 22, glucose 92, BUN 44, creatinine 3.9, calcium 8.6, BNP 1840.  Urine drug screen positive for cocaine and opiates.  To note that Ryan urine drug screen on November 27 was positive for cocaine and Ryan urine drug screen on November 3 was also positive for cocaine.  ASSESSMENT AND PLAN: 1. Hypertensive emergency. 2. Acute pulmonary edema secondary to hypertensive emergency. 3. Cocaine abuse possibly leading to Ryan hypertensive emergency plus     noncompliance with medications. 4. Chronic anemia possibly secondary to renal disease. 5. Chronic kidney disease, stage IV. 6. Bipolar disorder/schizophrenia. 7. Ongoing tobacco and  cocaine abuse. 8. Prior mitral valve repair with annuloplasty -- currently     significant murmur for mitral regurgitation -- with TE on September 22, 2010 showing 2 perivalvular leaks present and significant     central jet of mitral regurgitation present on TE -- reported as     moderate mitral regurgitation.  PLAN: 1. Cardiac.  He had a cardiac cath in September which shows negative     for any coronary artery disease, so I do not think there is any     issue related to acute coronary syndrome at this time.  There is no     need to check for that also, however, in Ryan ED they ordered for     cardiac troponin's which will be done. 2. For Ryan hypertensive emergency, I would start him on nitroglycerin     drip to  keep Ryan preload down which will also control his blood     pressure.  He will restart his medications.  I would not start him     back on metoprolol, but instead I will put him on Coreg which would     be better in Ryan case because it also has alpha blocking effect in     case if he uses cocaine.  We will restart him on enalapril instead     of lisinopril, which is a better antihypertensive medication.  Both     Coreg and lisinopril are in Ryan 4 dollar list at [place] and can     be easily purchased.  We will give Lasix 40 mg IV daily at this     time but attempt for Ryan nitroglycerin drip slowly off if his blood     pressure is stable.  For that currently, he will be in Ryan step-     down unit. 3. Renal.  We will start him on Aranesp 50 mcg subcutaneously 2     weekly, first dose now.  We will continue his iron tablets as     before but I suppose there is really an anemia of renal disease at     this time.  He has secondary hyperparathyroidism which was tested     in Ryan past and we will keep a close eye on his phosphor levels     also.  He can follow up with Ryan nephrologist outpatient. 4. Diet.  We will stick to 2 g sodium heart healthy diet at this time. 5. We will  restart his home medications at this time.  He does not     remember his medications but as per Ryan previous admission list, we     are sure that he is on trazodone and Risperdal which will be     continued at this time. 6. DVT prophylaxis with heparin.  DISPOSITION:  Admit to step-down for hypertensive emergency once Ryan blood pressure is more stable over next 12-hour period, we can shift him to Ryan tele floor.  A total of 60 minutes spent on Ryan admission.     Charlestine Massed, MD     UT/MEDQ  D:  01/04/2011  T:  01/04/2011  Job:  [id number]  cc:   Mina Marble, M.D. Noralyn Pick. Eden Emms, MD, [facility] [name], M.D.  Electronically Signed by Charlestine Massed MD on 01/04/2011 07:41:26 PM

## 2011-01-05 LAB — RENAL FUNCTION PANEL
Albumin: 2.4 g/dL — ABNORMAL LOW (ref 3.5–5.2)
GFR calc Af Amer: 21 mL/min — ABNORMAL LOW (ref >60)
GFR calc non Af Amer: 17 mL/min — ABNORMAL LOW (ref >60)
Glucose, Bld: 121 mg/dL — ABNORMAL HIGH (ref 70–99)
Phosphorus: 3.9 mg/dL (ref 2.3–4.6)
Potassium: 3.2 mEq/L — ABNORMAL LOW (ref 3.5–5.1)
Sodium: 137 mEq/L (ref 135–145)

## 2011-01-05 LAB — CARDIAC PANEL(CRET KIN+CKTOT+MB+TROPI)
Total CK: 185 U/L (ref 7–232)
Troponin I: 0.04 ng/mL (ref 0.00–0.06)
Troponin I: 0.02 ng/mL (ref 0.00–0.06)

## 2011-01-05 LAB — MAGNESIUM: Magnesium: 1.8 mg/dL (ref 1.5–2.5)

## 2011-01-06 ENCOUNTER — Inpatient Hospital Stay (HOSPITAL_COMMUNITY): Payer: Self-pay

## 2011-01-06 LAB — BASIC METABOLIC PANEL
Calcium: 8 mg/dL — ABNORMAL LOW (ref 8.4–10.5)
GFR calc Af Amer: 24 mL/min — ABNORMAL LOW (ref >60)
GFR calc non Af Amer: 20 mL/min — ABNORMAL LOW (ref >60)
Potassium: 3.9 mEq/L (ref 3.5–5.1)
Sodium: 136 mEq/L (ref 135–145)

## 2011-01-06 LAB — EXPECTORATED SPUTUM ASSESSMENT W GRAM STAIN, RFLX TO RESP C

## 2011-01-06 LAB — MAGNESIUM: Magnesium: 2.2 mg/dL (ref 1.5–2.5)

## 2011-01-08 LAB — CULTURE, RESPIRATORY W GRAM STAIN

## 2011-01-08 NOTE — Discharge Summary (Signed)
NAMESASUKE, YAFFE             ACCOUNT NO.:  [id number]  MEDICAL RECORD NO.:  [id number]           PATIENT TYPE:  I  LOCATION:  3707                         FACILITY:  [facility]  PHYSICIAN:  Peggye Pitt, M.D. DATE OF BIRTH:  04/07/1961  DATE OF ADMISSION:  01/04/2011 DATE OF DISCHARGE:  01/06/2011                              DISCHARGE SUMMARY   PRIMARY CARE PHYSICIAN:  Mina Marble, MD  CARDIOTHORACIC SURGEON:  [name], MD  THE PATIENT'S KIDNEY DOCTOR:  Terrial Rhodes, MD  DISCHARGE DIAGNOSES: 1. Flash pulmonary edema and hypertensive emergency secondary to     cocaine abuse. 2. Cocaine abuse. 3. Next stage IV chronic kidney disease. 4. History of severe mitral regurgitation status post mitral valve     annuloplasty/repair in July 2011. 5. History of schizophrenia. 6. Bipolar disorder. 7. Hypertension.  DISCHARGE MEDICATIONS: 1. Coreg 6.25 mg twice daily. 2. Enalapril 10 mg twice daily. 3. Mucinex 600 mg twice daily as needed for congestion. 4. Abilify 5 mg daily. 5. Aspirin 81 mg daily. 6. Fish oil 1200 mg twice daily. 7. Hydralazine 25 mg half a tablet three times a day. 8. Lasix 40 mg daily. 9. Multivitamin 1 tablet daily. 10.Norvasc 5 mg daily. 11.Vitamin C 1000 mg daily. He has been instructed to discontinue use of his metoprolol.  DISPOSITION AND FOLLOWUP:  Mr. Giraldo will be discharged home today in stable and improved condition.  He is to follow up with his PCP, Dr. Petra Kuba, in 2-3 weeks.  CONSULTATION THIS HOSPITALIZATION:  None.  IMAGES AND PROCEDURES:  Chest x-ray on January 04, 2011, that showed cardiomegaly and bibasilar atelectasis with mild bronchitic changes.  HISTORY AND PHYSICAL:  For details, see dictation by Dr. Berkley Harvey on January 04, 2011, but in brief, Mr. Melander is a 50 year old African American gentleman with a long history of cocaine abuse who presents to the hospital with shortness of breath worsening  over the past few days. He also has history of mitral valve annuloplasty in July 2011.  He was found to have very elevated blood pressure to 180/130 in the emergency department.  Because of this, we were asked to admit him for further evaluation.  HOSPITAL COURSE BY PROBLEM: 1. Hypertensive emergency with flash pulmonary edema.  This is     believed to be secondary to his cocaine abuse.  For the past year,     on all of his admissions, his tox screens have come back positive     for cocaine.  He has been adequately diuresed, is no longer     requiring oxygen, is no longer short of breath.  We think he is     stable for discharge home today. 2. Cocaine abuse.  He is interested at this time in cessation     counseling.  I have contacted our social worker who will provide     him with [facility]. 3. Hypertension with hypertensive emergency this admission.  He was     initially placed on a labetalol drip.  This has now been titrated     off for the past 48 hours.  He has  been on p.o. medications with     systolic blood pressures in the 130s to 140s.  We have made some     changes to his blood pressure regimen, including discontinuing his     metoprolol and exchanging it for carvedilol.  I do not believe that     a pure beta-blocker is safe in Mr. Axelson given his recurrent     history of cocaine abuse.  I believe that a beta-blocker that has     some alpha blockade properties as well is probably more indicated     at this time. 4. Stage IV chronic kidney disease.  His creatinine has been stable to     improved, it is 3.30 at the time of discharge.  VITAL SIGNS ON DAY OF DISCHARGE:  Blood pressure 145/91, heart rate 94, respirations 20, sats of 100% on room air, and temperature of 98.4.     Peggye Pitt, M.D.     EH/MEDQ  D:  01/06/2011  T:  01/07/2011  Job:  [id number]  cc:   Mina Marble, M.D. [name], M.D. Terrial Rhodes,  M.D.  Electronically Signed by Peggye Pitt M.D. on 01/08/2011 30:16:01 AM

## 2011-01-10 NOTE — Progress Notes (Signed)
Summary: [facility]: Office Visit  [facility]: Office Visit   Imported By: Earl Many 01/01/2011 09:28:23  _____________________________________________________________________  External Attachment:    Type:   Image     Comment:   External Document

## 2011-01-27 ENCOUNTER — Encounter: Payer: Self-pay | Admitting: Cardiovascular Disease

## 2011-01-30 LAB — URINE MICROSCOPIC-ADD ON

## 2011-01-30 LAB — BASIC METABOLIC PANEL
BUN: 45 mg/dL — ABNORMAL HIGH (ref 6–23)
BUN: 51 mg/dL — ABNORMAL HIGH (ref 6–23)
BUN: 55 mg/dL — ABNORMAL HIGH (ref 6–23)
CO2: 19 mEq/L (ref 19–32)
CO2: 22 mEq/L (ref 19–32)
Calcium: 8.8 mg/dL (ref 8.4–10.5)
Calcium: 8.8 mg/dL (ref 8.4–10.5)
Chloride: 103 mEq/L (ref 96–112)
Chloride: 109 mEq/L (ref 96–112)
Creatinine, Ser: 4.01 mg/dL — ABNORMAL HIGH (ref 0.4–1.5)
Creatinine, Ser: 4.9 mg/dL — ABNORMAL HIGH (ref 0.4–1.5)
GFR calc Af Amer: 19 mL/min — ABNORMAL LOW (ref >60)
GFR calc non Af Amer: 16 mL/min — ABNORMAL LOW (ref >60)
GFR calc non Af Amer: 16 mL/min — ABNORMAL LOW (ref >60)
Glucose, Bld: 95 mg/dL (ref 70–99)
Glucose, Bld: 97 mg/dL (ref 70–99)
Potassium: 3.8 mEq/L (ref 3.5–5.1)
Potassium: 4.3 mEq/L (ref 3.5–5.1)
Sodium: 136 mEq/L (ref 135–145)
Sodium: 139 mEq/L (ref 135–145)

## 2011-01-30 LAB — DIFFERENTIAL
Basophils Absolute: 0.1 10*3/uL (ref 0.0–0.1)
Basophils Relative: 0 % (ref 0–1)
Eosinophils Absolute: 0.1 10*3/uL (ref 0.0–0.7)
Eosinophils Relative: 0 % (ref 0–5)
Lymphocytes Relative: 15 % (ref 12–46)
Lymphs Abs: 1.1 10*3/uL (ref 0.7–4.0)
Monocytes Absolute: 0.6 10*3/uL (ref 0.1–1.0)
Monocytes Relative: 6 % (ref 3–12)
Neutro Abs: 5.2 10*3/uL (ref 1.7–7.7)
Neutro Abs: 8.5 10*3/uL — ABNORMAL HIGH (ref 1.7–7.7)

## 2011-01-30 LAB — CBC
HCT: 19.6 % — ABNORMAL LOW (ref 39.0–52.0)
HCT: 27.4 % — ABNORMAL LOW (ref 39.0–52.0)
Hemoglobin: 7 g/dL — ABNORMAL LOW (ref 13.0–17.0)
Hemoglobin: 9.3 g/dL — ABNORMAL LOW (ref 13.0–17.0)
MCH: 30.7 pg (ref 26.0–34.0)
MCH: 31 pg (ref 26.0–34.0)
MCHC: 34 g/dL (ref 30.0–36.0)
MCHC: 34.1 g/dL (ref 30.0–36.0)
MCV: 89.8 fL (ref 78.0–100.0)
MCV: 91 fL (ref 78.0–100.0)
MCV: 95.7 fL (ref 78.0–100.0)
Platelets: 235 10*3/uL (ref 150–400)
Platelets: 237 10*3/uL (ref 150–400)
Platelets: 263 10*3/uL (ref 150–400)
Platelets: 304 10*3/uL (ref 150–400)
RBC: 2.05 MIL/uL — ABNORMAL LOW (ref 4.22–5.81)
RDW: 16.2 % — ABNORMAL HIGH (ref 11.5–15.5)
RDW: 16.4 % — ABNORMAL HIGH (ref 11.5–15.5)
RDW: 16.9 % — ABNORMAL HIGH (ref 11.5–15.5)
RDW: 19.2 % — ABNORMAL HIGH (ref 11.5–15.5)
WBC: 10.8 10*3/uL — ABNORMAL HIGH (ref 4.0–10.5)
WBC: 7.1 10*3/uL (ref 4.0–10.5)
WBC: 7.9 10*3/uL (ref 4.0–10.5)

## 2011-01-30 LAB — RAPID URINE DRUG SCREEN, HOSP PERFORMED
Amphetamines: NOT DETECTED
Amphetamines: NOT DETECTED
Barbiturates: NOT DETECTED
Benzodiazepines: NOT DETECTED
Cocaine: POSITIVE — AB
Opiates: NOT DETECTED
Opiates: NOT DETECTED
Tetrahydrocannabinol: NOT DETECTED
Tetrahydrocannabinol: NOT DETECTED

## 2011-01-30 LAB — COMPREHENSIVE METABOLIC PANEL
ALT: 39 U/L (ref 0–53)
AST: 62 U/L — ABNORMAL HIGH (ref 0–37)
AST: 80 U/L — ABNORMAL HIGH (ref 0–37)
Albumin: 2.9 g/dL — ABNORMAL LOW (ref 3.5–5.2)
Albumin: 3.1 g/dL — ABNORMAL LOW (ref 3.5–5.2)
Alkaline Phosphatase: 84 U/L (ref 39–117)
BUN: 57 mg/dL — ABNORMAL HIGH (ref 6–23)
CO2: 22 mEq/L (ref 19–32)
Calcium: 8.5 mg/dL (ref 8.4–10.5)
Creatinine, Ser: 4.66 mg/dL — ABNORMAL HIGH (ref 0.4–1.5)
GFR calc Af Amer: 15 mL/min — ABNORMAL LOW (ref >60)
GFR calc Af Amer: 16 mL/min — ABNORMAL LOW (ref >60)
GFR calc non Af Amer: 13 mL/min — ABNORMAL LOW (ref >60)
Glucose, Bld: 85 mg/dL (ref 70–99)
Potassium: 3.9 mEq/L (ref 3.5–5.1)
Sodium: 133 mEq/L — ABNORMAL LOW (ref 135–145)
Total Bilirubin: 0.8 mg/dL (ref 0.3–1.2)
Total Protein: 6.1 g/dL (ref 6.0–8.3)

## 2011-01-30 LAB — HAPTOGLOBIN: Haptoglobin: <6 mg/dL — ABNORMAL LOW (ref 16–200)

## 2011-01-30 LAB — HIV ANTIBODY (ROUTINE TESTING W REFLEX): HIV: NONREACTIVE

## 2011-01-30 LAB — URINALYSIS, ROUTINE W REFLEX MICROSCOPIC
Bilirubin Urine: NEGATIVE
Bilirubin Urine: NEGATIVE
Bilirubin Urine: NEGATIVE
Glucose, UA: NEGATIVE mg/dL
Glucose, UA: NEGATIVE mg/dL
Ketones, ur: NEGATIVE mg/dL
Ketones, ur: NEGATIVE mg/dL
Ketones, ur: NEGATIVE mg/dL
Leukocytes, UA: NEGATIVE
Leukocytes, UA: NEGATIVE
Nitrite: NEGATIVE
Nitrite: NEGATIVE
Protein, ur: NEGATIVE mg/dL
Specific Gravity, Urine: 1.006 (ref 1.005–1.030)
Specific Gravity, Urine: 1.014 (ref 1.005–1.030)
Urobilinogen, UA: 0.2 mg/dL (ref 0.0–1.0)
Urobilinogen, UA: 0.2 mg/dL (ref 0.0–1.0)
pH: 5.5 (ref 5.0–8.0)
pH: 6 (ref 5.0–8.0)

## 2011-01-30 LAB — CULTURE, BLOOD (ROUTINE X 2)
Culture  Setup Time: 201111040149
Culture: NO GROWTH

## 2011-01-30 LAB — PTH, INTACT AND CALCIUM
Calcium, Total (PTH): 8.7 mg/dL (ref 8.4–10.5)
PTH: 149 pg/mL — ABNORMAL HIGH (ref 14.0–72.0)

## 2011-01-30 LAB — CARDIAC PANEL(CRET KIN+CKTOT+MB+TROPI)
CK, MB: 2.1 ng/mL (ref 0.3–4.0)
Total CK: 396 U/L — ABNORMAL HIGH (ref 7–232)

## 2011-01-30 LAB — CREATININE, URINE, RANDOM: Creatinine, Urine: 36.4 mg/dL

## 2011-01-30 LAB — CK TOTAL AND CKMB (NOT AT ARMC)
CK, MB: 2.7 ng/mL (ref 0.3–4.0)
CK, MB: 2.8 ng/mL (ref 0.3–4.0)
Relative Index: 0.6 (ref 0.0–2.5)
Total CK: 457 U/L — ABNORMAL HIGH (ref 7–232)
Total CK: 488 U/L — ABNORMAL HIGH (ref 7–232)

## 2011-01-30 LAB — CROSSMATCH
ABO/RH(D): A POS
Antibody Screen: NEGATIVE
Unit division: 0

## 2011-01-30 LAB — BRAIN NATRIURETIC PEPTIDE
Pro B Natriuretic peptide (BNP): 1250 pg/mL — ABNORMAL HIGH (ref 0.0–100.0)
Pro B Natriuretic peptide (BNP): 628 pg/mL — ABNORMAL HIGH (ref 0.0–100.0)

## 2011-01-30 LAB — ETHANOL: Alcohol, Ethyl (B): <5 mg/dL (ref 0–10)

## 2011-01-30 LAB — TROPONIN I: Troponin I: 0.06 ng/mL (ref 0.00–0.06)

## 2011-01-30 LAB — PHOSPHORUS: Phosphorus: 4.3 mg/dL (ref 2.3–4.6)

## 2011-01-30 LAB — LACTATE DEHYDROGENASE: LDH: 1581 U/L — ABNORMAL HIGH (ref 94–250)

## 2011-01-30 LAB — C4 COMPLEMENT: Complement C4, Body Fluid: 26 mg/dL (ref 16–47)

## 2011-01-30 LAB — HEPATITIS PANEL, ACUTE: Hep A IgM: NEGATIVE

## 2011-01-30 NOTE — Letter (Signed)
Summary: [facility]   [facility]   Imported By: Marylou Mccoy 01/22/2011 15:54:19  _____________________________________________________________________  External Attachment:    Type:   Image     Comment:   External Document

## 2011-02-03 LAB — DIFFERENTIAL
Eosinophils Absolute: 0.1 10*3/uL (ref 0.0–0.7)
Eosinophils Relative: 1 % (ref 0–5)
Lymphocytes Relative: 12 % (ref 12–46)
Lymphs Abs: 0.8 10*3/uL (ref 0.7–4.0)
Monocytes Absolute: 0.8 10*3/uL (ref 0.1–1.0)

## 2011-02-03 LAB — BASIC METABOLIC PANEL
BUN: 24 mg/dL — ABNORMAL HIGH (ref 6–23)
BUN: 27 mg/dL — ABNORMAL HIGH (ref 6–23)
CO2: 28 mEq/L (ref 19–32)
CO2: 29 mEq/L (ref 19–32)
Calcium: 8.1 mg/dL — ABNORMAL LOW (ref 8.4–10.5)
Calcium: 8.3 mg/dL — ABNORMAL LOW (ref 8.4–10.5)
Chloride: 94 mEq/L — ABNORMAL LOW (ref 96–112)
Chloride: 98 mEq/L (ref 96–112)
Creatinine, Ser: 1.37 mg/dL (ref 0.4–1.5)
Creatinine, Ser: 1.53 mg/dL — ABNORMAL HIGH (ref 0.4–1.5)
GFR calc Af Amer: 59 mL/min — ABNORMAL LOW (ref >60)
GFR calc Af Amer: >60 mL/min (ref >60)
GFR calc non Af Amer: 49 mL/min — ABNORMAL LOW (ref >60)
GFR calc non Af Amer: 55 mL/min — ABNORMAL LOW (ref >60)
Glucose, Bld: 100 mg/dL — ABNORMAL HIGH (ref 70–99)
Glucose, Bld: 88 mg/dL (ref 70–99)
Potassium: 3.3 mEq/L — ABNORMAL LOW (ref 3.5–5.1)
Potassium: 4 mEq/L (ref 3.5–5.1)
Sodium: 131 mEq/L — ABNORMAL LOW (ref 135–145)
Sodium: 132 mEq/L — ABNORMAL LOW (ref 135–145)

## 2011-02-03 LAB — CBC
HCT: 25.6 % — ABNORMAL LOW (ref 39.0–52.0)
Hemoglobin: 8.6 g/dL — ABNORMAL LOW (ref 13.0–17.0)
Hemoglobin: 8.6 g/dL — ABNORMAL LOW (ref 13.0–17.0)
MCH: 29.6 pg (ref 26.0–34.0)
MCHC: 33.6 g/dL (ref 30.0–36.0)
MCHC: 34.3 g/dL (ref 30.0–36.0)
MCV: 88.1 fL (ref 78.0–100.0)
Platelets: 107 10*3/uL — ABNORMAL LOW (ref 150–400)
Platelets: 269 10*3/uL (ref 150–400)
RBC: 2.9 MIL/uL — ABNORMAL LOW (ref 4.22–5.81)
RBC: 2.9 MIL/uL — ABNORMAL LOW (ref 4.22–5.81)
RDW: 13.9 % (ref 11.5–15.5)
WBC: 10.2 10*3/uL (ref 4.0–10.5)

## 2011-02-03 LAB — GLUCOSE, CAPILLARY
Glucose-Capillary: 60 mg/dL — ABNORMAL LOW (ref 70–99)
Glucose-Capillary: 98 mg/dL (ref 70–99)

## 2011-02-03 LAB — COMPREHENSIVE METABOLIC PANEL
ALT: 94 U/L — ABNORMAL HIGH (ref 0–53)
AST: 46 U/L — ABNORMAL HIGH (ref 0–37)
Albumin: 2.5 g/dL — ABNORMAL LOW (ref 3.5–5.2)
CO2: 26 mEq/L (ref 19–32)
Calcium: 8.2 mg/dL — ABNORMAL LOW (ref 8.4–10.5)
Chloride: 100 mEq/L (ref 96–112)
Creatinine, Ser: 1.65 mg/dL — ABNORMAL HIGH (ref 0.4–1.5)
GFR calc Af Amer: 54 mL/min — ABNORMAL LOW (ref >60)
GFR calc non Af Amer: 45 mL/min — ABNORMAL LOW (ref >60)
Sodium: 133 mEq/L — ABNORMAL LOW (ref 135–145)

## 2011-02-04 LAB — BASIC METABOLIC PANEL
BUN: 21 mg/dL (ref 6–23)
BUN: 22 mg/dL (ref 6–23)
BUN: 22 mg/dL (ref 6–23)
BUN: 31 mg/dL — ABNORMAL HIGH (ref 6–23)
CO2: 23 mEq/L (ref 19–32)
CO2: 24 mEq/L (ref 19–32)
CO2: 26 mEq/L (ref 19–32)
CO2: 26 mEq/L (ref 19–32)
CO2: 29 mEq/L (ref 19–32)
Calcium: 8.1 mg/dL — ABNORMAL LOW (ref 8.4–10.5)
Calcium: 8.2 mg/dL — ABNORMAL LOW (ref 8.4–10.5)
Calcium: 8.5 mg/dL (ref 8.4–10.5)
Calcium: 8.6 mg/dL (ref 8.4–10.5)
Chloride: 101 mEq/L (ref 96–112)
Chloride: 105 mEq/L (ref 96–112)
Chloride: 105 mEq/L (ref 96–112)
Creatinine, Ser: 1.32 mg/dL (ref 0.4–1.5)
Creatinine, Ser: 1.54 mg/dL — ABNORMAL HIGH (ref 0.4–1.5)
Creatinine, Ser: 1.73 mg/dL — ABNORMAL HIGH (ref 0.4–1.5)
Creatinine, Ser: 1.8 mg/dL — ABNORMAL HIGH (ref 0.4–1.5)
GFR calc Af Amer: 49 mL/min — ABNORMAL LOW (ref >60)
GFR calc Af Amer: 51 mL/min — ABNORMAL LOW (ref >60)
GFR calc Af Amer: 59 mL/min — ABNORMAL LOW (ref >60)
GFR calc Af Amer: >60 mL/min (ref >60)
GFR calc non Af Amer: 40 mL/min — ABNORMAL LOW (ref >60)
GFR calc non Af Amer: 41 mL/min — ABNORMAL LOW (ref >60)
GFR calc non Af Amer: 43 mL/min — ABNORMAL LOW (ref >60)
GFR calc non Af Amer: 48 mL/min — ABNORMAL LOW (ref >60)
GFR calc non Af Amer: 58 mL/min — ABNORMAL LOW (ref >60)
Glucose, Bld: 109 mg/dL — ABNORMAL HIGH (ref 70–99)
Glucose, Bld: 111 mg/dL — ABNORMAL HIGH (ref 70–99)
Glucose, Bld: 118 mg/dL — ABNORMAL HIGH (ref 70–99)
Glucose, Bld: 140 mg/dL — ABNORMAL HIGH (ref 70–99)
Glucose, Bld: 149 mg/dL — ABNORMAL HIGH (ref 70–99)
Potassium: 3.9 mEq/L (ref 3.5–5.1)
Potassium: 4 mEq/L (ref 3.5–5.1)
Potassium: 4.3 mEq/L (ref 3.5–5.1)
Potassium: 4.4 mEq/L (ref 3.5–5.1)
Potassium: 4.6 mEq/L (ref 3.5–5.1)
Sodium: 133 mEq/L — ABNORMAL LOW (ref 135–145)
Sodium: 133 mEq/L — ABNORMAL LOW (ref 135–145)
Sodium: 134 mEq/L — ABNORMAL LOW (ref 135–145)
Sodium: 139 mEq/L (ref 135–145)

## 2011-02-04 LAB — URINE CULTURE
Colony Count: NO GROWTH
Culture: NO GROWTH

## 2011-02-04 LAB — URINALYSIS, ROUTINE W REFLEX MICROSCOPIC
Bilirubin Urine: NEGATIVE
Glucose, UA: NEGATIVE mg/dL
Hgb urine dipstick: NEGATIVE
Ketones, ur: NEGATIVE mg/dL
Nitrite: NEGATIVE
Protein, ur: NEGATIVE mg/dL
Specific Gravity, Urine: 1.014 (ref 1.005–1.030)
Urobilinogen, UA: 0.2 mg/dL (ref 0.0–1.0)
pH: 5 (ref 5.0–8.0)

## 2011-02-04 LAB — DIFFERENTIAL
Basophils Absolute: 0 10*3/uL (ref 0.0–0.1)
Basophils Relative: 1 % (ref 0–1)
Eosinophils Absolute: 0.1 10*3/uL (ref 0.0–0.7)
Lymphocytes Relative: 25 % (ref 12–46)
Lymphs Abs: 1.6 10*3/uL (ref 0.7–4.0)
Neutro Abs: 4.2 10*3/uL (ref 1.7–7.7)
Neutrophils Relative %: 62 % (ref 43–77)
Neutrophils Relative %: 66 % (ref 43–77)

## 2011-02-04 LAB — COMPREHENSIVE METABOLIC PANEL
ALT: 30 U/L (ref 0–53)
ALT: 35 U/L (ref 0–53)
AST: 21 U/L (ref 0–37)
AST: 31 U/L (ref 0–37)
Albumin: 3 g/dL — ABNORMAL LOW (ref 3.5–5.2)
Alkaline Phosphatase: 66 U/L (ref 39–117)
Alkaline Phosphatase: 80 U/L (ref 39–117)
BUN: 26 mg/dL — ABNORMAL HIGH (ref 6–23)
BUN: 30 mg/dL — ABNORMAL HIGH (ref 6–23)
CO2: 24 mEq/L (ref 19–32)
CO2: 24 mEq/L (ref 19–32)
CO2: 26 mEq/L (ref 19–32)
Calcium: 8.3 mg/dL — ABNORMAL LOW (ref 8.4–10.5)
Calcium: 8.6 mg/dL (ref 8.4–10.5)
Chloride: 100 mEq/L (ref 96–112)
Chloride: 109 mEq/L (ref 96–112)
Creatinine, Ser: 1.76 mg/dL — ABNORMAL HIGH (ref 0.4–1.5)
Creatinine, Ser: 1.85 mg/dL — ABNORMAL HIGH (ref 0.4–1.5)
GFR calc Af Amer: 50 mL/min — ABNORMAL LOW (ref >60)
GFR calc non Af Amer: 39 mL/min — ABNORMAL LOW (ref >60)
GFR calc non Af Amer: 42 mL/min — ABNORMAL LOW (ref >60)
Glucose, Bld: 95 mg/dL (ref 70–99)
Glucose, Bld: 98 mg/dL (ref 70–99)
Potassium: 3.7 mEq/L (ref 3.5–5.1)
Potassium: 4.3 mEq/L (ref 3.5–5.1)
Sodium: 135 mEq/L (ref 135–145)
Sodium: 140 mEq/L (ref 135–145)
Total Bilirubin: 0.4 mg/dL (ref 0.3–1.2)
Total Bilirubin: 1.1 mg/dL (ref 0.3–1.2)
Total Protein: 5.1 g/dL — ABNORMAL LOW (ref 6.0–8.3)
Total Protein: 5.8 g/dL — ABNORMAL LOW (ref 6.0–8.3)

## 2011-02-04 LAB — POCT I-STAT 4, (NA,K, GLUC, HGB,HCT)
Glucose, Bld: 116 mg/dL — ABNORMAL HIGH (ref 70–99)
Glucose, Bld: 158 mg/dL — ABNORMAL HIGH (ref 70–99)
Glucose, Bld: 98 mg/dL (ref 70–99)
HCT: 21 % — ABNORMAL LOW (ref 39.0–52.0)
HCT: 24 % — ABNORMAL LOW (ref 39.0–52.0)
HCT: 24 % — ABNORMAL LOW (ref 39.0–52.0)
HCT: 29 % — ABNORMAL LOW (ref 39.0–52.0)
HCT: 33 % — ABNORMAL LOW (ref 39.0–52.0)
Hemoglobin: 11.2 g/dL — ABNORMAL LOW (ref 13.0–17.0)
Hemoglobin: 7.1 g/dL — ABNORMAL LOW (ref 13.0–17.0)
Hemoglobin: 9.9 g/dL — ABNORMAL LOW (ref 13.0–17.0)
Potassium: 3.8 mEq/L (ref 3.5–5.1)
Potassium: 3.9 mEq/L (ref 3.5–5.1)
Sodium: 133 mEq/L — ABNORMAL LOW (ref 135–145)
Sodium: 133 mEq/L — ABNORMAL LOW (ref 135–145)
Sodium: 136 mEq/L (ref 135–145)
Sodium: 136 mEq/L (ref 135–145)
Sodium: 137 mEq/L (ref 135–145)

## 2011-02-04 LAB — ANA: Anti Nuclear Antibody(ANA): NEGATIVE

## 2011-02-04 LAB — PROTIME-INR
INR: 1.5 — ABNORMAL HIGH (ref 0.00–1.49)
Prothrombin Time: 18 seconds — ABNORMAL HIGH (ref 11.6–15.2)

## 2011-02-04 LAB — POCT I-STAT, CHEM 8
BUN: 24 mg/dL — ABNORMAL HIGH (ref 6–23)
Calcium, Ion: 1.28 mmol/L (ref 1.12–1.32)
Chloride: 101 mEq/L (ref 96–112)
Creatinine, Ser: 1.5 mg/dL (ref 0.4–1.5)
Glucose, Bld: 133 mg/dL — ABNORMAL HIGH (ref 70–99)
HCT: 34 % — ABNORMAL LOW (ref 39.0–52.0)
Hemoglobin: 10.2 g/dL — ABNORMAL LOW (ref 13.0–17.0)
Potassium: 4.5 mEq/L (ref 3.5–5.1)
Sodium: 135 mEq/L (ref 135–145)
TCO2: 22 mmol/L (ref 0–100)
TCO2: 24 mmol/L (ref 0–100)

## 2011-02-04 LAB — CULTURE, BLOOD (ROUTINE X 2): Culture: NO GROWTH

## 2011-02-04 LAB — CBC
HCT: 28.8 % — ABNORMAL LOW (ref 39.0–52.0)
HCT: 30 % — ABNORMAL LOW (ref 39.0–52.0)
HCT: 31.9 % — ABNORMAL LOW (ref 39.0–52.0)
HCT: 32.4 % — ABNORMAL LOW (ref 39.0–52.0)
HCT: 32.5 % — ABNORMAL LOW (ref 39.0–52.0)
HCT: 33.8 % — ABNORMAL LOW (ref 39.0–52.0)
HCT: 35.3 % — ABNORMAL LOW (ref 39.0–52.0)
HCT: 36.1 % — ABNORMAL LOW (ref 39.0–52.0)
Hemoglobin: 10.1 g/dL — ABNORMAL LOW (ref 13.0–17.0)
Hemoglobin: 10.8 g/dL — ABNORMAL LOW (ref 13.0–17.0)
Hemoglobin: 10.8 g/dL — ABNORMAL LOW (ref 13.0–17.0)
Hemoglobin: 11 g/dL — ABNORMAL LOW (ref 13.0–17.0)
Hemoglobin: 11.4 g/dL — ABNORMAL LOW (ref 13.0–17.0)
Hemoglobin: 11.8 g/dL — ABNORMAL LOW (ref 13.0–17.0)
Hemoglobin: 12.1 g/dL — ABNORMAL LOW (ref 13.0–17.0)
Hemoglobin: 12.1 g/dL — ABNORMAL LOW (ref 13.0–17.0)
Hemoglobin: 9.8 g/dL — ABNORMAL LOW (ref 13.0–17.0)
MCH: 29.6 pg (ref 26.0–34.0)
MCH: 29.7 pg (ref 26.0–34.0)
MCH: 29.8 pg (ref 26.0–34.0)
MCH: 29.8 pg (ref 26.0–34.0)
MCH: 29.8 pg (ref 26.0–34.0)
MCH: 29.9 pg (ref 26.0–34.0)
MCH: 30 pg (ref 26.0–34.0)
MCH: 30.4 pg (ref 26.0–34.0)
MCHC: 33.3 g/dL (ref 30.0–36.0)
MCHC: 33.6 g/dL (ref 30.0–36.0)
MCHC: 33.6 g/dL (ref 30.0–36.0)
MCHC: 33.8 g/dL (ref 30.0–36.0)
MCHC: 33.8 g/dL (ref 30.0–36.0)
MCHC: 33.9 g/dL (ref 30.0–36.0)
MCHC: 34 g/dL (ref 30.0–36.0)
MCHC: 34.4 g/dL (ref 30.0–36.0)
MCV: 88.2 fL (ref 78.0–100.0)
MCV: 88.2 fL (ref 78.0–100.0)
MCV: 88.3 fL (ref 78.0–100.0)
MCV: 88.6 fL (ref 78.0–100.0)
MCV: 88.6 fL (ref 78.0–100.0)
MCV: 88.6 fL (ref 78.0–100.0)
MCV: 88.9 fL (ref 78.0–100.0)
Platelets: 105 10*3/uL — ABNORMAL LOW (ref 150–400)
Platelets: 118 10*3/uL — ABNORMAL LOW (ref 150–400)
Platelets: 126 10*3/uL — ABNORMAL LOW (ref 150–400)
Platelets: 128 10*3/uL — ABNORMAL LOW (ref 150–400)
Platelets: 129 10*3/uL — ABNORMAL LOW (ref 150–400)
Platelets: 197 10*3/uL (ref 150–400)
Platelets: 208 10*3/uL (ref 150–400)
RBC: 3.26 MIL/uL — ABNORMAL LOW (ref 4.22–5.81)
RBC: 3.38 MIL/uL — ABNORMAL LOW (ref 4.22–5.81)
RBC: 3.64 MIL/uL — ABNORMAL LOW (ref 4.22–5.81)
RBC: 3.67 MIL/uL — ABNORMAL LOW (ref 4.22–5.81)
RBC: 3.83 MIL/uL — ABNORMAL LOW (ref 4.22–5.81)
RBC: 3.87 MIL/uL — ABNORMAL LOW (ref 4.22–5.81)
RBC: 3.98 MIL/uL — ABNORMAL LOW (ref 4.22–5.81)
RBC: 4 MIL/uL — ABNORMAL LOW (ref 4.22–5.81)
RBC: 4.08 MIL/uL — ABNORMAL LOW (ref 4.22–5.81)
RDW: 14.1 % (ref 11.5–15.5)
RDW: 14.2 % (ref 11.5–15.5)
RDW: 14.2 % (ref 11.5–15.5)
RDW: 14.3 % (ref 11.5–15.5)
RDW: 14.4 % (ref 11.5–15.5)
RDW: 14.4 % (ref 11.5–15.5)
RDW: 14.8 % (ref 11.5–15.5)
WBC: 13.8 10*3/uL — ABNORMAL HIGH (ref 4.0–10.5)
WBC: 15.3 10*3/uL — ABNORMAL HIGH (ref 4.0–10.5)
WBC: 15.3 10*3/uL — ABNORMAL HIGH (ref 4.0–10.5)
WBC: 15.4 10*3/uL — ABNORMAL HIGH (ref 4.0–10.5)
WBC: 18.7 10*3/uL — ABNORMAL HIGH (ref 4.0–10.5)
WBC: 6.9 10*3/uL (ref 4.0–10.5)
WBC: 7 10*3/uL (ref 4.0–10.5)
WBC: 7.3 10*3/uL (ref 4.0–10.5)

## 2011-02-04 LAB — HEPATIC FUNCTION PANEL
Albumin: 2.7 g/dL — ABNORMAL LOW (ref 3.5–5.2)
Total Bilirubin: 0.5 mg/dL (ref 0.3–1.2)
Total Protein: 5.3 g/dL — ABNORMAL LOW (ref 6.0–8.3)

## 2011-02-04 LAB — EXPECTORATED SPUTUM ASSESSMENT W GRAM STAIN, RFLX TO RESP C

## 2011-02-04 LAB — LIPID PANEL
Total CHOL/HDL Ratio: 2.1 RATIO
VLDL: 8 mg/dL (ref 0–40)

## 2011-02-04 LAB — POCT I-STAT 3, VENOUS BLOOD GAS (G3P V)
Acid-base deficit: 1 mmol/L (ref 0.0–2.0)
Bicarbonate: 23.5 mEq/L (ref 20.0–24.0)
O2 Saturation: 72 %
TCO2: 25 mmol/L (ref 0–100)
pH, Ven: 7.396 — ABNORMAL HIGH (ref 7.250–7.300)

## 2011-02-04 LAB — CROSSMATCH
ABO/RH(D): A POS
ABO/RH(D): A POS
Antibody Screen: NEGATIVE

## 2011-02-04 LAB — GLUCOSE, CAPILLARY
Glucose-Capillary: 104 mg/dL — ABNORMAL HIGH (ref 70–99)
Glucose-Capillary: 116 mg/dL — ABNORMAL HIGH (ref 70–99)
Glucose-Capillary: 117 mg/dL — ABNORMAL HIGH (ref 70–99)
Glucose-Capillary: 121 mg/dL — ABNORMAL HIGH (ref 70–99)
Glucose-Capillary: 132 mg/dL — ABNORMAL HIGH (ref 70–99)
Glucose-Capillary: 136 mg/dL — ABNORMAL HIGH (ref 70–99)
Glucose-Capillary: 140 mg/dL — ABNORMAL HIGH (ref 70–99)
Glucose-Capillary: 141 mg/dL — ABNORMAL HIGH (ref 70–99)
Glucose-Capillary: 66 mg/dL — ABNORMAL LOW (ref 70–99)
Glucose-Capillary: 77 mg/dL (ref 70–99)

## 2011-02-04 LAB — BLOOD GAS, ARTERIAL
Acid-Base Excess: 1.3 mmol/L (ref 0.0–2.0)
Bicarbonate: 25.3 mEq/L — ABNORMAL HIGH (ref 20.0–24.0)
Drawn by: 23604
FIO2: 0.21 %
O2 Saturation: 98.4 %
Patient temperature: 98.6
TCO2: 26.5 mmol/L (ref 0–100)
pCO2 arterial: 39.7 mmHg (ref 35.0–45.0)
pH, Arterial: 7.421 (ref 7.350–7.450)
pO2, Arterial: 97.2 mmHg (ref 80.0–100.0)

## 2011-02-04 LAB — MRSA PCR SCREENING: MRSA by PCR: NEGATIVE

## 2011-02-04 LAB — CARDIAC PANEL(CRET KIN+CKTOT+MB+TROPI)
CK, MB: 4 ng/mL (ref 0.3–4.0)
Total CK: 168 U/L (ref 7–232)

## 2011-02-04 LAB — POCT I-STAT 3, ART BLOOD GAS (G3+)
Acid-base deficit: 2 mmol/L (ref 0.0–2.0)
Acid-base deficit: 3 mmol/L — ABNORMAL HIGH (ref 0.0–2.0)
Acid-base deficit: 4 mmol/L — ABNORMAL HIGH (ref 0.0–2.0)
Bicarbonate: 22.6 mEq/L (ref 20.0–24.0)
Bicarbonate: 23 mEq/L (ref 20.0–24.0)
O2 Saturation: 100 %
O2 Saturation: 100 %
O2 Saturation: 97 %
O2 Saturation: 98 %
Patient temperature: 99
TCO2: 24 mmol/L (ref 0–100)
TCO2: 24 mmol/L (ref 0–100)
TCO2: 26 mmol/L (ref 0–100)
pCO2 arterial: 39.7 mmHg (ref 35.0–45.0)
pCO2 arterial: 42 mmHg (ref 35.0–45.0)
pCO2 arterial: 49.5 mmHg — ABNORMAL HIGH (ref 35.0–45.0)
pH, Arterial: 7.354 (ref 7.350–7.450)
pO2, Arterial: 296 mmHg — ABNORMAL HIGH (ref 80.0–100.0)
pO2, Arterial: 80 mmHg (ref 80.0–100.0)
pO2, Arterial: 84 mmHg (ref 80.0–100.0)

## 2011-02-04 LAB — POCT CARDIAC MARKERS
CKMB, poc: 1.3 ng/mL (ref 1.0–8.0)
Myoglobin, poc: 104 ng/mL (ref 12–200)

## 2011-02-04 LAB — HEPATITIS PANEL, ACUTE
HCV Ab: NEGATIVE
Hepatitis B Surface Ag: NEGATIVE

## 2011-02-04 LAB — CREATININE, SERUM
Creatinine, Ser: 1.36 mg/dL (ref 0.4–1.5)
Creatinine, Ser: 1.65 mg/dL — ABNORMAL HIGH (ref 0.4–1.5)
GFR calc Af Amer: 54 mL/min — ABNORMAL LOW (ref >60)
GFR calc Af Amer: >60 mL/min (ref >60)
GFR calc non Af Amer: 45 mL/min — ABNORMAL LOW (ref >60)
GFR calc non Af Amer: 56 mL/min — ABNORMAL LOW (ref >60)

## 2011-02-04 LAB — RAPID URINE DRUG SCREEN, HOSP PERFORMED
Amphetamines: NOT DETECTED
Benzodiazepines: NOT DETECTED
Cocaine: POSITIVE — AB
Opiates: POSITIVE — AB
Tetrahydrocannabinol: POSITIVE — AB

## 2011-02-04 LAB — HIV ANTIBODY (ROUTINE TESTING W REFLEX): HIV: NONREACTIVE

## 2011-02-04 LAB — BRAIN NATRIURETIC PEPTIDE
Pro B Natriuretic peptide (BNP): 1030 pg/mL — ABNORMAL HIGH (ref 0.0–100.0)
Pro B Natriuretic peptide (BNP): 392 pg/mL — ABNORMAL HIGH (ref 0.0–100.0)

## 2011-02-04 LAB — MAGNESIUM
Magnesium: 1.9 mg/dL (ref 1.5–2.5)
Magnesium: 2.2 mg/dL (ref 1.5–2.5)
Magnesium: 2.4 mg/dL (ref 1.5–2.5)
Magnesium: 2.9 mg/dL — ABNORMAL HIGH (ref 1.5–2.5)

## 2011-02-04 LAB — SODIUM, URINE, RANDOM: Sodium, Ur: 110 mEq/L

## 2011-02-04 LAB — PROTEIN, URINE, RANDOM: Total Protein, Urine: 6 mg/dL

## 2011-02-04 LAB — HEMOGLOBIN AND HEMATOCRIT, BLOOD: Hemoglobin: 7.2 g/dL — ABNORMAL LOW (ref 13.0–17.0)

## 2011-02-04 LAB — PROCALCITONIN: Procalcitonin: <0.5 ng/mL

## 2011-02-04 LAB — HEMOGLOBIN A1C
Hgb A1c MFr Bld: 5.4 % (ref <5.7)
Mean Plasma Glucose: 108 mg/dL (ref <117)

## 2011-02-04 LAB — POCT I-STAT GLUCOSE: Glucose, Bld: 142 mg/dL — ABNORMAL HIGH (ref 70–99)

## 2011-02-04 LAB — APTT
aPTT: 28 seconds (ref 24–37)
aPTT: 33 seconds (ref 24–37)

## 2011-02-06 LAB — BASIC METABOLIC PANEL
CO2: 23 mEq/L (ref 19–32)
Chloride: 109 mEq/L (ref 96–112)
Creatinine, Ser: 1.7 mg/dL — ABNORMAL HIGH (ref 0.4–1.5)
GFR calc Af Amer: 52 mL/min — ABNORMAL LOW (ref >60)
Potassium: 3.9 mEq/L (ref 3.5–5.1)
Sodium: 138 mEq/L (ref 135–145)

## 2011-02-06 LAB — DIFFERENTIAL
Basophils Relative: 0 % (ref 0–1)
Eosinophils Absolute: 0.1 10*3/uL (ref 0.0–0.7)
Eosinophils Relative: 2 % (ref 0–5)
Lymphs Abs: 2 10*3/uL (ref 0.7–4.0)
Monocytes Absolute: 0.5 10*3/uL (ref 0.1–1.0)
Monocytes Relative: 7 % (ref 3–12)

## 2011-02-06 LAB — CBC
HCT: 35.9 % — ABNORMAL LOW (ref 39.0–52.0)
Hemoglobin: 11.8 g/dL — ABNORMAL LOW (ref 13.0–17.0)
MCHC: 32.8 g/dL (ref 30.0–36.0)
MCV: 89.1 fL (ref 78.0–100.0)
RBC: 4.03 MIL/uL — ABNORMAL LOW (ref 4.22–5.81)

## 2011-02-08 ENCOUNTER — Ambulatory Visit: Payer: Self-pay | Admitting: Cardiovascular Disease

## 2011-03-07 ENCOUNTER — Encounter: Payer: Self-pay | Admitting: Cardiovascular Disease

## 2011-03-07 ENCOUNTER — Ambulatory Visit (INDEPENDENT_AMBULATORY_CARE_PROVIDER_SITE_OTHER): Payer: Self-pay | Admitting: Cardiovascular Disease

## 2011-03-07 DIAGNOSIS — I340 Nonrheumatic mitral (valve) insufficiency: Secondary | ICD-10-CM

## 2011-03-07 DIAGNOSIS — I5033 Acute on chronic diastolic (congestive) heart failure: Secondary | ICD-10-CM

## 2011-03-07 DIAGNOSIS — I059 Rheumatic mitral valve disease, unspecified: Secondary | ICD-10-CM

## 2011-03-07 NOTE — Assessment & Plan Note (Signed)
The patient mitral regurgitation murmur is unchanged. We'll continue to treat with strict blood pressure control and diuresis.

## 2011-03-07 NOTE — Patient Instructions (Signed)
Your physician recommends that you have lab work today: BMP, BNP and CBC  Your physician recommends that you continue on your current medications as directed. Please refer to the Current Medication list given to you today.  Your physician wants you to follow-up in: 3 MONTHS.  You will receive a reminder letter in the mail two months in advance. If you don't receive a letter, please call our office to schedule the follow-up appointment.

## 2011-03-07 NOTE — Progress Notes (Signed)
HPI:  This is a 50 year old gentleman presenting for followup evaluation. The patient initially presented in 2011 with congestive heart failure. He was diagnosed with severe mitral regurgitation felt to be secondary to a combination of annular dilatation and posterior leaflet restriction.  His clinical course has been complicated by recurrent congestive heart failure, polysubstance abuse, and chronic kidney disease.  The patient was hospitalized in November 2011 with recurrent heart failure and he had a cardiac murmur consistent with recurrent mitral regurgitation. He underwent transesophageal echocardiogram demonstrating 2 anterior perivalvular leaks and a small posterior perivalvular leak there was also a central jet of mitral regurgitation visualized. The patient's LV systolic function has been normal postoperatively.  He complains of continued exertional dyspnea. He has orthopnea but only on an intermittent basis. He denies leg edema, chest pain, lightheadedness, palpitations, or syncope. He describes dyspnea with one flight of stairs or with walking up a hill. He has been unable to work because of exertional fatigue and dyspnea. The patient has previously worked as a [employer] administrator Prescriptions as of 03/07/2011  Medication Sig Dispense Refill  . amLODipine (NORVASC) 5 MG tablet Take 5 mg by mouth daily.        . ARIPiprazole (ABILIFY) 5 MG tablet Take 5 mg by mouth daily.        . Ascorbic Acid (VITAMIN C) 500 MG tablet Take 500 mg by mouth daily.        Marland Kitchen aspirin 81 MG EC tablet Take 81 mg by mouth daily.        . fish oil-omega-3 fatty acids 1000 MG capsule Take 2 g by mouth daily.        . furosemide (LASIX) 40 MG tablet Take 40 mg by mouth daily.        . hydrALAZINE (APRESOLINE) 10 MG tablet Take 10 mg by mouth 3 (three) times daily.        . metoprolol (LOPRESSOR) 50 MG tablet Take 50 mg by mouth 2 (two) times daily.        . Multiple Vitamin (MULTIVITAMIN) tablet Take 1  tablet by mouth daily.          No Known Allergies  Past Medical History  Diagnosis Date  . Hypertension   . Bipolar disorder   . Schizophrenia     h/o hospitalized inpt therapy @ the [facility]  . MVP (mitral valve prolapse)     with severe MR    ROS: Negative except as per HPI  BP 134/80  Pulse 89  Resp 18  Ht 5\' 5"  (1.651 m)  Wt 143 lb 6.4 oz (65.046 kg)  BMI 23.86 kg/m2  PHYSICAL EXAM: Pt is alert and oriented, NAD HEENT: normal Neck: JVP - elevated at 8-10 cm, carotids 2+= without bruits Lungs: CTA bilaterally CV: RRR with a grade 3/6 holosystolic murmur at the apex Abd: soft, NT, Positive BS, no hepatomegaly Ext: no C/C/E, distal pulses intact and equal Skin: warm/dry no rash  EKG: normal sinus rhythm 89 beats per minute, left ventricular hypertrophy, nonspecific T-wave abnormality.  ASSESSMENT AND PLAN:

## 2011-03-07 NOTE — Assessment & Plan Note (Signed)
The patient has clinical evidence of heart failure with elevated neck veins and continued exertional dyspnea. Treatment options are difficult as he is developed worsening renal function. I reviewed his lab work in the last assessment of his creatinine was one he was in the hospital in February. His creatinine at that time ranged from 3.5-4 mg per deciliter. Before increasing his furosemide, and going to check his renal function. If he remains in that range, I will likely increase his furosemide to 80 mg twice daily. Will otherwise continue afterload reduction with hydralazine and consider increasing the dose to 25 mg 3 times daily. He is not a candidate for an ACE inhibitor or ARB because of advanced chronic kidney disease.

## 2011-03-08 ENCOUNTER — Telehealth: Payer: Self-pay | Admitting: *Deleted

## 2011-03-08 DIAGNOSIS — I5033 Acute on chronic diastolic (congestive) heart failure: Secondary | ICD-10-CM

## 2011-03-08 LAB — CBC WITH DIFFERENTIAL/PLATELET
Basophils Absolute: 0 10*3/uL (ref 0.0–0.1)
Basophils Relative: 0.8 % (ref 0.0–3.0)
Eosinophils Absolute: 0.1 10*3/uL (ref 0.0–0.7)
HCT: 25.1 % — ABNORMAL LOW (ref 39.0–52.0)
Hemoglobin: 8.6 g/dL — ABNORMAL LOW (ref 13.0–17.0)
Lymphocytes Relative: 17.8 % (ref 12.0–46.0)
Lymphs Abs: 1 10*3/uL (ref 0.7–4.0)
MCHC: 34.1 g/dL (ref 30.0–36.0)
Monocytes Relative: 8.6 % (ref 3.0–12.0)
Neutro Abs: 4.1 10*3/uL (ref 1.4–7.7)
RBC: 2.71 Mil/uL — ABNORMAL LOW (ref 4.22–5.81)
RDW: 17.4 % — ABNORMAL HIGH (ref 11.5–14.6)

## 2011-03-08 LAB — BASIC METABOLIC PANEL
BUN: 57 mg/dL — ABNORMAL HIGH (ref 6–23)
Chloride: 108 mEq/L (ref 96–112)
Glucose, Bld: 69 mg/dL — ABNORMAL LOW (ref 70–99)
Potassium: 4.5 mEq/L (ref 3.5–5.1)
Sodium: 139 mEq/L (ref 135–145)

## 2011-03-08 MED ORDER — FUROSEMIDE 80 MG PO TABS: 80.0000 mg | ORAL_TABLET | Freq: Two times a day (BID) | ORAL | Status: DC

## 2011-03-08 NOTE — Telephone Encounter (Signed)
Received call from Tonya at [facility] lab reporting creatinine of 4.5. Lab results not in computer yet. Last documented creatinine 3.3

## 2011-03-08 NOTE — Telephone Encounter (Signed)
Reviewed with Dr. Excell Seltzer. Per Dr.Cooper increase lasix to 80 mg twice daily and have pt see Dr. Arrie Aran in about one week but no later than 2 weeks.  I called pt and gave him this information and told him I would call Dr. Hadley Pen office to arrange appt.  I called Dr. Hadley Pen office and left message on scheduler's voice mail with this information and left message to call us back.  Will send prescription to [facility] at [facility] and [address]  Per pt request.

## 2011-03-08 NOTE — Telephone Encounter (Signed)
Last documented creatinine 3.3 on 01/06/11.  Office note from visit with Dr. Excell Seltzer on 03/07/11 reviewed.  Will review with Dr. Johney Frame (office DOD).

## 2011-03-22 ENCOUNTER — Encounter (HOSPITAL_COMMUNITY): Payer: Self-pay | Attending: Nephrology

## 2011-03-22 ENCOUNTER — Other Ambulatory Visit: Payer: Self-pay | Admitting: Nephrology

## 2011-03-22 DIAGNOSIS — D638 Anemia in other chronic diseases classified elsewhere: Secondary | ICD-10-CM | POA: Insufficient documentation

## 2011-03-22 DIAGNOSIS — N183 Chronic kidney disease, stage 3 unspecified: Secondary | ICD-10-CM | POA: Insufficient documentation

## 2011-03-22 LAB — RENAL FUNCTION PANEL
Albumin: 3.4 g/dL — ABNORMAL LOW (ref 3.5–5.2)
BUN: 52 mg/dL — ABNORMAL HIGH (ref 6–23)
Chloride: 101 mEq/L (ref 96–112)
Creatinine, Ser: 4.65 mg/dL — ABNORMAL HIGH (ref 0.4–1.5)
Glucose, Bld: 87 mg/dL (ref 70–99)
Potassium: 4.2 mEq/L (ref 3.5–5.1)

## 2011-03-22 LAB — IRON AND TIBC
Iron: 48 ug/dL (ref 42–135)
TIBC: 290 ug/dL (ref 215–435)
UIBC: 242 ug/dL

## 2011-03-22 LAB — FERRITIN: Ferritin: 66 ng/mL (ref 22–322)

## 2011-03-23 LAB — PTH, INTACT AND CALCIUM: PTH: 117 pg/mL — ABNORMAL HIGH (ref 14.0–72.0)

## 2011-03-27 ENCOUNTER — Ambulatory Visit (INDEPENDENT_AMBULATORY_CARE_PROVIDER_SITE_OTHER): Payer: Self-pay | Admitting: Vascular Surgery

## 2011-03-27 ENCOUNTER — Encounter (INDEPENDENT_AMBULATORY_CARE_PROVIDER_SITE_OTHER): Payer: Self-pay

## 2011-03-27 DIAGNOSIS — N184 Chronic kidney disease, stage 4 (severe): Secondary | ICD-10-CM

## 2011-03-27 DIAGNOSIS — N186 End stage renal disease: Secondary | ICD-10-CM

## 2011-03-27 DIAGNOSIS — Z48812 Encounter for surgical aftercare following surgery on the circulatory system: Secondary | ICD-10-CM

## 2011-03-27 DIAGNOSIS — Z01810 Encounter for preprocedural cardiovascular examination: Secondary | ICD-10-CM

## 2011-03-28 NOTE — Consult Note (Signed)
NEW PATIENT CONSULTATION  Ryan Winters, Ryan Winters DOB:  07/14/1961                                       03/27/2011 ZOXWR#:[id number]  Patient presents today for discussion regarding AV access.  He is referred by Dr. Lottie Rater with progressive renal insufficiency. His creatinine is 4.5.  It is felt to be related to continued cocaine abuse.  He has had anemia and noncompliance regarding erythropoietin replacement.  His history is otherwise negative for mitral valve prolapse surgery for this.  A history of bipolar disorder, elevated cholesterol, and elevated blood pressure.  SOCIAL HISTORY:  He is married with 2 children.  He does smoke.  Does not drink alcohol.  REVIEW OF SYSTEMS:  No weight loss or gain.  He weighs 144 pounds.  He is 5 feet 6 inches tall.  He does have shortness of breath when lying flat, renal insufficiency, history of dizziness and blackouts and bipolar disorder.  Review of systems is otherwise negative.  PHYSICAL EXAMINATION:  A well-developed, thin black male in no acute distress.  Blood pressure is 126/77, pulse 89, respirations 20.  HEENT: Normal.  He has 2+ radial pulses bilaterally.  He does have very superficial surface veins in his arms bilaterally.  His cephalic vein is too small to visualize, and his right forearm does have a moderate-sized cephalic vein in the forearm on the left extending up into the cephalic with a diameter in the 2-3 mm range.  This does appear to be somewhat in spasm due to the thickening of the wall on imaging, this myself with [employer].  I had a long discussion with the patient and his wife present.  I explained the option of AV fistula creation versus AV graft placement. I certainly would not recommend graft placement at this time, although its size is marginal and is quite superficial and does appear to be the quality that will frequently dilate nicely with pressurization.  I have recommended exploration  of his cephalic vein on the left at the wrist and radiocephalic fistula if the vein is adequate.  If not, would recommend upper arm fistula creation in the same setting.  He does not wish to schedule this at this time and will notify our office when he wishes to proceed with outpatient procedure.    Larina Earthly, M.D. Electronically Signed  TFE/MEDQ  D:  03/27/2011  T:  03/28/2011  Job:  [id number]  cc:   Terrial Rhodes, M.D.

## 2011-03-29 NOTE — Procedures (Unsigned)
CEPHALIC VEIN MAPPING  INDICATION:  Preoperative vein mapping for arteriovenous fistula placement.  HISTORY: Chronic kidney disease stage 4.  EXAM: The right cephalic vein is compressible with diameter measurements ranging from 0.18 to 0.27 cm.  The right basilic vein is compressible with diameter measurements ranging from 0.29 to 0.30 cm.  The right basilic vein subdivides into multiple branches in the forearm.  The left cephalic vein is compressible with diameter measurements ranging from 0.19 to 0.33 cm.  The left basilic vein is compressible with diameter measurements ranging from 0.16 to 0.47 cm.  See attached worksheet for all measurements.  IMPRESSION:  Patent right and left cephalic and basilic veins with diameter measurements as described above.  ___________________________________________ Larina Earthly, M.D.  EM/MEDQ  D:  03/27/2011  T:  03/27/2011  Job:  [id number]

## 2011-04-03 NOTE — Assessment & Plan Note (Signed)
OFFICE VISIT   Ryan Winters, Ryan Winters  DOB:  08/21/1961                                        August 09, 2010  CHART #:  [id number]   CURRENT PROBLEMS:  1. Status post mitral valve repair for severe mitral regurgitation and      a reduced left ventricular function, May 30, 2010.  2. History of cocaine abuse, smoking, and hypertension.   PRESENT ILLNESS:  The patient returns for further followup after his  mitral valve repair earlier this summer, to discuss his activity level  and return to work status.  He has noticed that his blood pressure is  increasing especially after eating salty chips in the 160-170 range.  He  has had no symptoms of CHF, but he does have some dyspnea when he climbs  stairs.  Since his last visit, he underwent a transthoracic echo at the  hospital, which shows some recurrent mitral regurgitation in the 2+  range.  The etiology of his mitral regurgitation was unclear at the time  of his presentation, but was probably due to some restriction of the  posterior leaflet and this was treated with release of the secondary  chords to the leaflet and an annuloplasty ring.  The transesophageal  echo intraoperatively at the end of the repair was trivial to mild  residual MR.  It appears that his higher blood pressure may be resulting  in increased mitral regurgitation and he does have a 2/6 murmur  radiating some to the axilla.  He has maintained sinus rhythm and has no  edema.   CURRENT MEDICATIONS:  1. Tramadol.  2. Metoprolol 50 b.i.d.  3. Aspirin 81 mg.  4. Trazodone and Risperdal for his anxiety disorder and      bipolar/schizophrenia.  5. He is on lisinopril 20 mg daily.   PHYSICAL EXAMINATION:  Vital Signs:  Blood pressure 145/85, pulse 84 and  regular, respirations 18, and saturation 99%.  Lungs:  Breath sounds are  clear and equal.  The sternum is well healed.  Cardiac:  Rhythm is  regular.  He has a grade 2/6 holosystolic  murmur.  Extremities:  There  is no peripheral edema.   IMPRESSION AND PLAN:  The patient had severe mitral regurgitation, now  has probably moderate mitral regurgitation with recurrent rising blood  pressure.  I have told him to increase his lisinopril to 20 mg b.i.d.  and to avoid salty foods.  I told him he could return to work in his  [employer], starting August 19, 2010.  I will plan on seeing him back for  a followup in approximately 8 weeks and he will be seeing Dr. Excell Seltzer in  the short term as well.   Ryan Winters, M.D.  Electronically Signed   PV/MEDQ  D:  08/09/2010  T:  08/10/2010  Job:  [id number]   cc:   Veverly Fells. Excell Seltzer, MD

## 2011-04-03 NOTE — Assessment & Plan Note (Signed)
OFFICE VISIT   Ryan Winters, Ryan Winters  DOB:  1960/12/09                                        July 19, 2010  CHART #:  [id number]   CURRENT PROBLEMS:  1. Status post mitral valve repair for severe mitral regurgitation      with reduced left ventricular ejection fraction on May 30, 2010.  2. History of cocaine abuse, smoking, and hypertension.   PRESENT ILLNESS:  The patient returns for further evaluation after  undergoing mitral valve repair almost 2 months ago.  He is continuing to  do well without recurrent symptoms of CHF.  He is maintaining the sinus  rhythm.  He has smoked some tobacco but no other drugs.  He is walking  20 minutes a day with his wife and is following a heart-healthy diet.  He recently was evaluated at the [facility] Cardiology office and had the  lisinopril dose increased from 10-20 mg a day due to increasing systolic  blood pressure.  He is also stopped on his Lasix and potassium after a  BMP was checked, presumably for elevated BUN.  He denies dyspnea on  exertion or orthopnea.  He is still having some sternal incisional  discomfort and takes Vicodin occasionally.  We will try to convert this  to Ultram 50 mg twice a day as needed.  He is also taking his iron (Nu-  Iron) and this will be discontinued.  He requests a return to work form  which would be 3 months after surgery on August 30, 2010, which I have  provided.   CURRENT MEDICATIONS:  1. Lisinopril 20 mg a day.  2. Metoprolol tartrate 50 mg p.o. b.i.d.  3. Multivitamins.  4. Risperdal.  5. Trazodone.  6. Aspirin 81 mg.  7. Pain medication.   PHYSICAL EXAMINATION:  Blood pressure 150/100, pulse 100, respirations  16, saturation 99%.  He is alert, pleasant, and in no distress.  Breath  sounds are clear and equal.  The sternal incision is well healed.  Cardiac rhythm is regular without gallop.  There is a soft 1-2/6  systolic murmur at the left sternal border with some  radiation to the  left axilla.  There is no peripheral edema.  Neurologic exam is intact.   IMPRESSION AND PLAN:  The patient is now 2 months after surgery and  continues to progress.  Fortunately, he has not resumed taking drugs but  he is smoking minimally.  His blood pressure is starting to increase and  his antihypertensive medication has been adjusted accordingly.  He has a  soft murmur.  The postop TEE at the hospital showed residual trivial to  mild mitral regurgitation.  We will follow up with a 2-D echo at the  [facility] and I will see him back in 3 weeks to review  his clinical status.   [name], M.D.  Electronically Signed   PV/MEDQ  D:  07/19/2010  T:  07/20/2010  Job:  [id number]   cc:   Veverly Fells. Excell Seltzer, MD

## 2011-04-03 NOTE — Assessment & Plan Note (Signed)
OFFICE VISIT   Ryan Winters, Ryan Winters  DOB:  [birth year]                                        November 15, 2010  CHART #:  [id number]   CURRENT PROBLEMS:  1. Status post mitral valve repair for severe mitral regurgitation,      May 30, 2010.  2. Hypertension.  3. Bipolar disorder - schizophrenia.  4. History of substance abuse.   PRESENT ILLNESS:  The patient is a 50 year old male who returns for a  final surgical followup after undergoing mitral valve repair 5 months  previously.  At that time, he had severe central mitral regurgitation  repaired with an annuloplasty ring and release of retracted posterior  leaflet cords.  His EF was 50%.  Following surgery, he has done well.  He has no more symptoms of CHF.  The surgical incision is healing and he  is maintaining sinus rhythm.  He is not a Coumadin candidate and he is  taking 81 mg of aspirin a day.  His blood pressure is well controlled on  metoprolol 25 b.i.d. and lisinopril 20 mg daily.  He smokes cigarettes,  but no other drugs at this time.  He is taking Abilify 5 mg daily for  his psychiatric problems.   He has been followed in the office and a recurrent 2/6 murmur was noted.  A followup echo shows him to have moderate recurrent mitral  regurgitation approximately 2+/4.  He also states he is anemic and is in  the process of receiving IV iron injections.  He states his stool has  been checked for occult blood and has been negative.  He denies  orthopnea, PND, or edema.   PHYSICAL EXAMINATION:  Vital Signs:  His blood pressure is 150/90, pulse  88 and regular, respirations 16, and saturation 99%.  Lungs:  Breath  sounds are clear.  The sternum is stable and well healed.  Cardiac:  Rhythm is regular.  He has no gallop.  He has a 2/6 systolic murmur at  the left lower sternal border which did not radiate well to the left  axilla.  Extremities:  There is no peripheral edema.  Neurologic:   Nonfocal.   IMPRESSION AND PLAN:  The patient is doing well after mitral valve  repair for severe mitral regurgitation.  He has 2+ moderate mitral  regurgitation residual.  Hopefully with healthy habits and compliance  with medications, this will not progress to a significant problem.  He  will return to the care of Dr. Excell Seltzer and I would be happy to see him  back for any further surgical issues.   [name], M.D.  Electronically Signed   PV/MEDQ  D:  11/15/2010  T:  11/15/2010  Job:  [id number]   cc:   Veverly Fells. Excell Seltzer, MD

## 2011-04-03 NOTE — Assessment & Plan Note (Signed)
OFFICE VISIT   Ryan Winters, Ryan Winters  DOB:  [birth year]                                        June 22, 2010  CHART #:  [id number]   CURRENT PROBLEMS:  1. Status post mitral valve repair for severe mitral regurgitation      with reduced left ventricular function on May 30, 2010.  2. History of cocaine abuse and hypertension.   PRESENT ILLNESS:  The patient is a 50 year old African American male who  returns for his first postop office visit after undergoing mitral valve  repair.  He presented with sudden orthopnea and PND and presented to the  emergency department where his BNP was elevated as well as a positive  drug screen on his urine.  Chest x-ray showed CHF and he had frothy  sputum.  He has treated with diuresis and underwent cath, which  demonstrated normal coronaries with PA pressures were elevated and  reduced LVEF.  He had severe mitral regurgitation and was referred for  repair.  He has a history of schizophrenia and bipolar disorder.   He underwent repair of the regurgitation, which appeared to be due to  some restriction of the posterior leaflet, although there was no  thickening or evidence of inflammation with fairly delicate cords and a  pliable leaflet.  There was a cleft in the posterior leaflet, which was  closed as well as release of the posterior leaflet by removing some of  the secondary chordae tendineae.  There is also an annuloplasty ring  placed.  The operative result in the OR left successful with trace  residual MR.  He was discharged home in 5 days and maintained sinus  rhythm.  He is having some musculoskeletal incisional pain, but no  symptoms of CHF and the incision is healed well.   PHYSICAL EXAMINATION:  Vital Signs:  His blood pressure is 140/85, pulse  is 60, respirations 18, and saturation 99%.  General:  He is alert and  pleasant, and appears to be doing well.  Lungs:  Breath sounds are  clear.  The sternum is stable and  well healed.  Cardiac:  Rhythm is  regular.  He has a soft left lower sternal border murmur.  There is  minimal radiation to the left axilla.  Extremities:  He has no  peripheral edema.  Neurologic:  Intact.   DIAGNOSTIC TESTS:  His PA and lateral chest x-ray is clear and the  sternal wires are intact.   IMPRESSION:  The patient clinically is doing well, but he has a  recurrent soft murmur.  This could represent systolic anterior motion of  the anterior leaflet with outflow tract turbulence.  His blood pressure  is also elevated.  I did not think he could tolerate more beta-blockade  with a heart rate of 50-60, so I have started him on lisinopril.  He  will be checking with his cardiologist, Dr. Eden Emms, who will follow up  with a 2-D echo.   I told the patient he could resume driving and light activity at this  time, but not to lift more than 20 pounds and I will see him back in  later August before he plans on returning back to work at an [employer] business, which he is involved with.   [name], M.D.  Electronically Signed  PV/MEDQ  D:  06/22/2010  T:  06/22/2010  Job:  [id number]   cc:   Noralyn Pick. Eden Emms, MD, [facility]

## 2011-04-05 ENCOUNTER — Encounter (HOSPITAL_COMMUNITY): Payer: Self-pay
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
## 2011-04-17 ENCOUNTER — Encounter (HOSPITAL_COMMUNITY): Payer: Self-pay

## 2011-04-17 ENCOUNTER — Other Ambulatory Visit: Payer: Self-pay | Admitting: Nephrology

## 2011-05-01 ENCOUNTER — Encounter (HOSPITAL_COMMUNITY): Payer: Self-pay

## 2011-05-01 ENCOUNTER — Encounter: Payer: Self-pay | Admitting: Cardiovascular Disease

## 2011-05-08 ENCOUNTER — Encounter (HOSPITAL_COMMUNITY): Payer: Self-pay | Attending: Nephrology

## 2011-05-08 ENCOUNTER — Other Ambulatory Visit: Payer: Self-pay | Admitting: Nephrology

## 2011-05-08 DIAGNOSIS — D638 Anemia in other chronic diseases classified elsewhere: Secondary | ICD-10-CM | POA: Insufficient documentation

## 2011-05-08 DIAGNOSIS — N183 Chronic kidney disease, stage 3 unspecified: Secondary | ICD-10-CM | POA: Insufficient documentation

## 2011-05-08 LAB — RENAL FUNCTION PANEL
BUN: 53 mg/dL — ABNORMAL HIGH (ref 6–23)
CO2: 25 mEq/L (ref 19–32)
Calcium: 9.5 mg/dL (ref 8.4–10.5)
Creatinine, Ser: 3.55 mg/dL — ABNORMAL HIGH (ref 0.50–1.35)
GFR calc Af Amer: 22 mL/min — ABNORMAL LOW (ref >60)
Glucose, Bld: 97 mg/dL (ref 70–99)
Phosphorus: 3.8 mg/dL (ref 2.3–4.6)
Sodium: 137 mEq/L (ref 135–145)

## 2011-05-14 ENCOUNTER — Encounter (HOSPITAL_COMMUNITY): Payer: Self-pay

## 2011-05-17 ENCOUNTER — Encounter (HOSPITAL_COMMUNITY)
Admission: RE | Admit: 2011-05-17 | Discharge: 2011-05-17 | Disposition: A | Discharge status: Home / Self Care | Payer: Self-pay | Source: Ambulatory Visit | Attending: Vascular Surgery | Admitting: Vascular Surgery

## 2011-05-17 DIAGNOSIS — Z01818 Encounter for other preprocedural examination: Secondary | ICD-10-CM | POA: Insufficient documentation

## 2011-05-17 DIAGNOSIS — Z01812 Encounter for preprocedural laboratory examination: Secondary | ICD-10-CM | POA: Insufficient documentation

## 2011-05-17 LAB — CBC
HCT: 27.6 % — ABNORMAL LOW (ref 39.0–52.0)
Hemoglobin: 9.4 g/dL — ABNORMAL LOW (ref 13.0–17.0)
MCH: 30.3 pg (ref 26.0–34.0)
MCHC: 34.1 g/dL (ref 30.0–36.0)
MCV: 89 fL (ref 78.0–100.0)
RBC: 3.1 MIL/uL — ABNORMAL LOW (ref 4.22–5.81)

## 2011-05-17 LAB — COMPREHENSIVE METABOLIC PANEL
ALT: 27 U/L (ref 0–53)
BUN: 54 mg/dL — ABNORMAL HIGH (ref 6–23)
CO2: 23 mEq/L (ref 19–32)
Calcium: 8.2 mg/dL — ABNORMAL LOW (ref 8.4–10.5)
GFR calc Af Amer: 17 mL/min — ABNORMAL LOW (ref >60)
GFR calc non Af Amer: 14 mL/min — ABNORMAL LOW (ref >60)
Glucose, Bld: 93 mg/dL (ref 70–99)
Sodium: 138 mEq/L (ref 135–145)
Total Protein: 6 g/dL (ref 6.0–8.3)

## 2011-05-17 LAB — SURGICAL PCR SCREEN: MRSA, PCR: NEGATIVE

## 2011-05-24 ENCOUNTER — Ambulatory Visit (HOSPITAL_COMMUNITY): Payer: Self-pay

## 2011-05-24 ENCOUNTER — Other Ambulatory Visit: Payer: Self-pay | Admitting: Nephrology

## 2011-05-24 ENCOUNTER — Ambulatory Visit (HOSPITAL_COMMUNITY)
Admission: RE | Admit: 2011-05-24 | Discharge: 2011-05-24 | Disposition: A | Discharge status: Home / Self Care | Payer: Self-pay | Source: Ambulatory Visit | Attending: Vascular Surgery | Admitting: Vascular Surgery

## 2011-05-24 DIAGNOSIS — I129 Hypertensive chronic kidney disease with stage 1 through stage 4 chronic kidney disease, or unspecified chronic kidney disease: Secondary | ICD-10-CM | POA: Insufficient documentation

## 2011-05-24 DIAGNOSIS — N186 End stage renal disease: Secondary | ICD-10-CM

## 2011-05-24 DIAGNOSIS — I120 Hypertensive chronic kidney disease with stage 5 chronic kidney disease or end stage renal disease: Secondary | ICD-10-CM

## 2011-05-24 DIAGNOSIS — J4489 Other specified chronic obstructive pulmonary disease: Secondary | ICD-10-CM | POA: Insufficient documentation

## 2011-05-24 DIAGNOSIS — Z01812 Encounter for preprocedural laboratory examination: Secondary | ICD-10-CM | POA: Insufficient documentation

## 2011-05-24 DIAGNOSIS — N189 Chronic kidney disease, unspecified: Secondary | ICD-10-CM | POA: Insufficient documentation

## 2011-05-24 DIAGNOSIS — I2510 Atherosclerotic heart disease of native coronary artery without angina pectoris: Secondary | ICD-10-CM | POA: Insufficient documentation

## 2011-05-24 DIAGNOSIS — F319 Bipolar disorder, unspecified: Secondary | ICD-10-CM | POA: Insufficient documentation

## 2011-05-24 DIAGNOSIS — J449 Chronic obstructive pulmonary disease, unspecified: Secondary | ICD-10-CM | POA: Insufficient documentation

## 2011-05-24 DIAGNOSIS — F209 Schizophrenia, unspecified: Secondary | ICD-10-CM | POA: Insufficient documentation

## 2011-05-24 HISTORY — PX: AV FISTULA PLACEMENT, RADIOCEPHALIC: SHX1208

## 2011-05-24 LAB — POCT I-STAT 4, (NA,K, GLUC, HGB,HCT)
Hemoglobin: 9.5 g/dL — ABNORMAL LOW (ref 13.0–17.0)
Sodium: 136 mEq/L (ref 135–145)

## 2011-05-25 NOTE — Op Note (Signed)
  NAMEGRAYTON, Ryan Winters             ACCOUNT NO.:  [id number]  MEDICAL RECORD NO.:  [id number]  LOCATION:  SDSC                         FACILITY:  [facility]  PHYSICIAN:  Di Kindle. Edilia Bo, M.D.DATE OF BIRTH:  07-21-1961  DATE OF PROCEDURE:  05/24/2011 DATE OF DISCHARGE:  05/24/2011                              OPERATIVE REPORT   PREOPERATIVE DIAGNOSIS:  Chronic kidney disease.  POSTOPERATIVE DIAGNOSIS:  Chronic kidney disease.  PROCEDURE:  Placement of a left radiocephalic arteriovenous fistula.  SURGEON:  Di Kindle. Edilia Bo, MD  ASSISTANT:  Nurse.  ANESTHESIA:  Local with sedation.  TECHNIQUE:  The patient was taken to the operating room and sedated by Anesthesia.  The left upper extremity was prepped and draped in the usual sterile fashion.  After the skin was infiltrated with 1% lidocaine, a longitudinal incision was made over the cephalic vein.  The vein was fairly small, but I dissected it out, ligated distally, and then irrigated it up with heparinized saline.  It was about a 3.5-mm vein.  I elected to attempt a forearm AV fistula.  It was lateral, so I made a separate incision over the radial artery after the skin was anesthetized.  Here, the radial artery was dissected free.  Again, it too was somewhat small, but it was dissected free proximally and distally and the patient was then heparinized.  The vein was tunneled over for anastomosis to the radial artery.  After the heparin had circulated, the radial artery was clamped proximally and distally and a longitudinal arteriotomy was made.  The vein was then spatulated and sewn end to side to the artery using continuous 6-0 Prolene suture.  I passed a 2.5-mm dilator in both directions through the anastomosis. Anastomosis was completed and there was a palpable thrill in the fistula.  Heparin was partially reversed with protamine.  The wounds were then closed with deep layer of 3-0 Vicryl and the skin was  closed with 4-0 Vicryl.  Sterile dressing was applied.  The patient tolerated the procedure well and was transferred to the recovery room in stable condition.  All needle and sponge counts were correct.     Di Kindle. Edilia Bo, M.D.     CSD/MEDQ  D:  05/24/2011  T:  05/25/2011  Job:  [id number]  Electronically Signed by Waverly Ferrari M.D. on 05/25/2011 05:23:29 PM

## 2011-05-28 LAB — IRON AND TIBC: Saturation Ratios: 27 % (ref 20–55)

## 2011-06-07 ENCOUNTER — Encounter (HOSPITAL_COMMUNITY): Payer: Self-pay

## 2011-06-14 ENCOUNTER — Other Ambulatory Visit: Payer: Self-pay | Admitting: Nephrology

## 2011-06-14 ENCOUNTER — Encounter (HOSPITAL_COMMUNITY): Payer: Medicaid Other | Attending: Nephrology

## 2011-06-14 DIAGNOSIS — N183 Chronic kidney disease, stage 3 unspecified: Secondary | ICD-10-CM | POA: Insufficient documentation

## 2011-06-14 DIAGNOSIS — D638 Anemia in other chronic diseases classified elsewhere: Secondary | ICD-10-CM | POA: Insufficient documentation

## 2011-06-14 LAB — RENAL FUNCTION PANEL
CO2: 22 mEq/L (ref 19–32)
Calcium: 9.3 mg/dL (ref 8.4–10.5)
Chloride: 105 mEq/L (ref 96–112)
GFR calc Af Amer: 15 mL/min — ABNORMAL LOW (ref >60)
GFR calc non Af Amer: 12 mL/min — ABNORMAL LOW (ref >60)
Glucose, Bld: 100 mg/dL — ABNORMAL HIGH (ref 70–99)
Potassium: 4.2 mEq/L (ref 3.5–5.1)
Sodium: 139 mEq/L (ref 135–145)

## 2011-06-15 NOTE — Progress Notes (Signed)
Subjective:     Patient ID: Ryan Winters, male   DOB: 03/17/1961, 50 y.o.   MRN: [id number]  HPI  Patient had a radiocephalic fistula on 05/24/2011. He comes in for a routine followup visit. He has had some paresthesias along the thumb which are gradually improving. He's had no significant pain in his hand. He is not yet on dialysis.  Past Medical History  Diagnosis Date  . Hypertension   . Bipolar disorder   . Schizophrenia     h/o hospitalized inpt therapy @ the [facility]  . MVP (mitral valve prolapse)     with severe MR    No family history on file.  History  Substance Use Topics  . Smoking status: Current Everyday Smoker  . Smokeless tobacco: Not on file  . Alcohol Use: Yes    No Known Allergies  Current Outpatient Prescriptions  Medication Sig Dispense Refill  . amLODipine (NORVASC) 5 MG tablet Take 5 mg by mouth daily.        . ARIPiprazole (ABILIFY) 5 MG tablet Take 5 mg by mouth daily.        . Ascorbic Acid (VITAMIN C) 500 MG tablet Take 500 mg by mouth daily.        Marland Kitchen aspirin 81 MG EC tablet Take 81 mg by mouth daily.        . fish oil-omega-3 fatty acids 1000 MG capsule Take 2 g by mouth daily.        . furosemide (LASIX) 80 MG tablet Take 1 tablet (80 mg total) by mouth 2 (two) times daily.  60 tablet  6  . hydrALAZINE (APRESOLINE) 10 MG tablet Take 10 mg by mouth 3 (three) times daily.        . metoprolol (LOPRESSOR) 50 MG tablet Take 50 mg by mouth 2 (two) times daily.        . Multiple Vitamin (MULTIVITAMIN) tablet Take 1 tablet by mouth daily.          Review of Systems     Objective:   Physical Exam The patient appears their stated age.   There were no vitals filed for this visit.  There is no height or weight on file to calculate BMI.  His incisions in the left wrist had healed nicely. He has an excellent thrill in his left AV fistula. The vein is maturing nicely.      Assessment:     Overall I am pleased with his progress  and his fistula appears to be maturing adequately. I think this will provide adequate access if and when he needs dialysis.    Plan:     We will see him back prn.

## 2011-06-20 ENCOUNTER — Encounter: Payer: Self-pay | Admitting: Vascular Surgery

## 2011-06-20 ENCOUNTER — Ambulatory Visit (INDEPENDENT_AMBULATORY_CARE_PROVIDER_SITE_OTHER): Payer: Self-pay | Admitting: Vascular Surgery

## 2011-06-20 VITALS — BP 147/89 | HR 87 | Temp 98.3°F | Ht 66.0 in | Wt 147.0 lb

## 2011-06-20 DIAGNOSIS — N186 End stage renal disease: Secondary | ICD-10-CM

## 2011-06-28 ENCOUNTER — Other Ambulatory Visit: Payer: Self-pay | Admitting: Nephrology

## 2011-06-28 ENCOUNTER — Encounter (HOSPITAL_COMMUNITY): Payer: Medicaid Other | Attending: Nephrology

## 2011-06-28 DIAGNOSIS — D638 Anemia in other chronic diseases classified elsewhere: Secondary | ICD-10-CM | POA: Insufficient documentation

## 2011-06-28 DIAGNOSIS — N183 Chronic kidney disease, stage 3 unspecified: Secondary | ICD-10-CM | POA: Insufficient documentation

## 2011-06-28 LAB — POCT HEMOGLOBIN-HEMACUE: Hemoglobin: 8.6 g/dL — ABNORMAL LOW (ref 13.0–17.0)

## 2011-06-29 LAB — PTH, INTACT AND CALCIUM: Calcium, Total (PTH): 9.3 mg/dL (ref 8.4–10.5)

## 2011-07-12 ENCOUNTER — Encounter (HOSPITAL_COMMUNITY): Payer: Self-pay

## 2011-07-16 ENCOUNTER — Encounter (HOSPITAL_COMMUNITY): Payer: Self-pay

## 2011-07-24 ENCOUNTER — Other Ambulatory Visit: Payer: Self-pay | Admitting: Nephrology

## 2011-07-24 ENCOUNTER — Encounter (HOSPITAL_COMMUNITY): Payer: Medicaid Other | Attending: Nephrology

## 2011-07-24 DIAGNOSIS — N183 Chronic kidney disease, stage 3 unspecified: Secondary | ICD-10-CM | POA: Insufficient documentation

## 2011-07-24 DIAGNOSIS — D638 Anemia in other chronic diseases classified elsewhere: Secondary | ICD-10-CM | POA: Insufficient documentation

## 2011-07-24 LAB — RENAL FUNCTION PANEL
Albumin: 3.2 g/dL — ABNORMAL LOW (ref 3.5–5.2)
CO2: 22 mEq/L (ref 19–32)
Chloride: 108 mEq/L (ref 96–112)
Creatinine, Ser: 3.77 mg/dL — ABNORMAL HIGH (ref 0.50–1.35)
GFR calc Af Amer: 21 mL/min — ABNORMAL LOW (ref >60)
GFR calc non Af Amer: 17 mL/min — ABNORMAL LOW (ref >60)
Potassium: 4.2 mEq/L (ref 3.5–5.1)
Sodium: 139 mEq/L (ref 135–145)

## 2011-07-24 LAB — POCT HEMOGLOBIN-HEMACUE: Hemoglobin: 8.4 g/dL — ABNORMAL LOW (ref 13.0–17.0)

## 2011-07-24 LAB — IRON AND TIBC
Iron: 56 ug/dL (ref 42–135)
Saturation Ratios: 22 % (ref 20–55)
TIBC: 249 ug/dL (ref 215–435)

## 2011-08-07 ENCOUNTER — Encounter (HOSPITAL_COMMUNITY): Payer: Self-pay

## 2011-08-08 LAB — DIFFERENTIAL
Basophils Absolute: 0
Eosinophils Relative: 2
Lymphocytes Relative: 35
Lymphs Abs: 2
Neutro Abs: 3.2
Neutrophils Relative %: 56

## 2011-08-08 LAB — BASIC METABOLIC PANEL
BUN: 21
CO2: 28
Calcium: 9.2
Chloride: 105
Creatinine, Ser: 1.7 — ABNORMAL HIGH
Glucose, Bld: 102 — ABNORMAL HIGH

## 2011-08-08 LAB — CBC
MCHC: 34.7
MCV: 85.6
RBC: 4.84
RDW: 13.9

## 2011-08-12 ENCOUNTER — Emergency Department (HOSPITAL_COMMUNITY)
Admission: EM | Admit: 2011-08-12 | Discharge: 2011-08-12 | Disposition: A | Discharge status: Home / Self Care | Payer: Medicaid Other | Attending: Emergency Medicine | Admitting: Emergency Medicine

## 2011-08-12 ENCOUNTER — Emergency Department (HOSPITAL_COMMUNITY): Payer: Medicaid Other

## 2011-08-12 DIAGNOSIS — R000 Tachycardia, unspecified: Secondary | ICD-10-CM | POA: Insufficient documentation

## 2011-08-12 DIAGNOSIS — R42 Dizziness and giddiness: Secondary | ICD-10-CM | POA: Insufficient documentation

## 2011-08-12 DIAGNOSIS — F17200 Nicotine dependence, unspecified, uncomplicated: Secondary | ICD-10-CM | POA: Insufficient documentation

## 2011-08-12 DIAGNOSIS — J40 Bronchitis, not specified as acute or chronic: Secondary | ICD-10-CM | POA: Insufficient documentation

## 2011-08-12 DIAGNOSIS — I10 Essential (primary) hypertension: Secondary | ICD-10-CM | POA: Insufficient documentation

## 2011-08-12 DIAGNOSIS — R112 Nausea with vomiting, unspecified: Secondary | ICD-10-CM | POA: Insufficient documentation

## 2011-08-12 DIAGNOSIS — R0602 Shortness of breath: Secondary | ICD-10-CM | POA: Insufficient documentation

## 2011-08-12 DIAGNOSIS — Z954 Presence of other heart-valve replacement: Secondary | ICD-10-CM | POA: Insufficient documentation

## 2011-08-12 DIAGNOSIS — F319 Bipolar disorder, unspecified: Secondary | ICD-10-CM | POA: Insufficient documentation

## 2011-08-12 LAB — DIFFERENTIAL
Basophils Absolute: 0 10*3/uL (ref 0.0–0.1)
Basophils Relative: 0 % (ref 0–1)
Eosinophils Absolute: 0.8 10*3/uL — ABNORMAL HIGH (ref 0.0–0.7)
Lymphocytes Relative: 13 % (ref 12–46)
Monocytes Relative: 11 % (ref 3–12)
Neutrophils Relative %: 68 % (ref 43–77)

## 2011-08-12 LAB — CBC
Hemoglobin: 10.3 g/dL — ABNORMAL LOW (ref 13.0–17.0)
MCV: 90.8 fL (ref 78.0–100.0)
Platelets: 178 10*3/uL (ref 150–400)
RBC: 3.38 MIL/uL — ABNORMAL LOW (ref 4.22–5.81)
WBC: 9.5 10*3/uL (ref 4.0–10.5)

## 2011-08-12 LAB — BASIC METABOLIC PANEL
CO2: 23 mEq/L (ref 19–32)
Calcium: 8.8 mg/dL (ref 8.4–10.5)
Chloride: 106 mEq/L (ref 96–112)
Potassium: 4.3 mEq/L (ref 3.5–5.1)
Sodium: 138 mEq/L (ref 135–145)

## 2011-08-12 LAB — PRO B NATRIURETIC PEPTIDE: Pro B Natriuretic peptide (BNP): 5739 pg/mL — ABNORMAL HIGH (ref 0–125)

## 2011-08-14 DIAGNOSIS — Z0271 Encounter for disability determination: Secondary | ICD-10-CM

## 2011-08-15 ENCOUNTER — Other Ambulatory Visit: Payer: Self-pay | Admitting: Nephrology

## 2011-08-15 ENCOUNTER — Encounter (HOSPITAL_COMMUNITY): Payer: Medicaid Other

## 2011-08-27 LAB — CBC
HCT: 41.4
Hemoglobin: 14.2
MCHC: 34.3
MCV: 86.8
Platelets: 192
RBC: 4.78
RDW: 14.1
WBC: 5.5

## 2011-08-27 LAB — COMPREHENSIVE METABOLIC PANEL
ALT: 18
AST: 20
Alkaline Phosphatase: 75
CO2: 29
Chloride: 106
GFR calc Af Amer: >60
GFR calc non Af Amer: 55 — ABNORMAL LOW
Sodium: 139
Total Bilirubin: 0.6

## 2011-08-27 LAB — POCT CARDIAC MARKERS
CKMB, poc: <1 — ABNORMAL LOW
Myoglobin, poc: 81.2
Operator id: 4708
Troponin i, poc: <0.05

## 2011-08-27 LAB — DIFFERENTIAL
Basophils Absolute: 0
Basophils Relative: 1
Eosinophils Absolute: 0.2
Eosinophils Relative: 3
Lymphocytes Relative: 17
Lymphs Abs: 0.9
Monocytes Absolute: 0.7
Monocytes Relative: 13 — ABNORMAL HIGH
Neutro Abs: 3.7
Neutrophils Relative %: 66

## 2011-08-27 LAB — COMPREHENSIVE METABOLIC PANEL WITH GFR
Albumin: 3.2 — ABNORMAL LOW
BUN: 12
Calcium: 8.9
Creatinine, Ser: 1.4
Glucose, Bld: 102 — ABNORMAL HIGH
Potassium: 4.1
Total Protein: 6.4

## 2011-08-29 ENCOUNTER — Encounter (HOSPITAL_COMMUNITY): Payer: Self-pay

## 2011-09-05 ENCOUNTER — Other Ambulatory Visit: Payer: Self-pay | Admitting: Cardiovascular Disease

## 2011-09-05 ENCOUNTER — Encounter (HOSPITAL_COMMUNITY): Payer: Self-pay

## 2011-09-05 ENCOUNTER — Other Ambulatory Visit: Payer: Self-pay | Admitting: Physician Assistant

## 2011-09-06 NOTE — Telephone Encounter (Signed)
Med is not listed. Message left to patient to call me back. Vikki Ports

## 2011-09-13 ENCOUNTER — Encounter (HOSPITAL_COMMUNITY): Payer: Self-pay

## 2011-09-14 NOTE — Telephone Encounter (Signed)
Pt returning call to [facility]. Please call back.

## 2011-10-01 ENCOUNTER — Encounter (HOSPITAL_COMMUNITY): Payer: Self-pay | Admitting: Emergency Medicine

## 2011-10-01 ENCOUNTER — Other Ambulatory Visit: Payer: Self-pay

## 2011-10-01 ENCOUNTER — Inpatient Hospital Stay (HOSPITAL_COMMUNITY)
Admission: EM | Admit: 2011-10-01 | Discharge: 2011-10-03 | DRG: 392 | Disposition: A | Discharge status: Home / Self Care | Payer: Medicaid Other | Attending: Internal Medicine | Admitting: Internal Medicine

## 2011-10-01 DIAGNOSIS — D631 Anemia in chronic kidney disease: Secondary | ICD-10-CM | POA: Diagnosis present

## 2011-10-01 DIAGNOSIS — K922 Gastrointestinal hemorrhage, unspecified: Secondary | ICD-10-CM

## 2011-10-01 DIAGNOSIS — E785 Hyperlipidemia, unspecified: Secondary | ICD-10-CM | POA: Diagnosis present

## 2011-10-01 DIAGNOSIS — I059 Rheumatic mitral valve disease, unspecified: Secondary | ICD-10-CM

## 2011-10-01 DIAGNOSIS — I5033 Acute on chronic diastolic (congestive) heart failure: Secondary | ICD-10-CM

## 2011-10-01 DIAGNOSIS — K5900 Constipation, unspecified: Principal | ICD-10-CM | POA: Diagnosis present

## 2011-10-01 DIAGNOSIS — I340 Nonrheumatic mitral (valve) insufficiency: Secondary | ICD-10-CM | POA: Diagnosis present

## 2011-10-01 DIAGNOSIS — I129 Hypertensive chronic kidney disease with stage 1 through stage 4 chronic kidney disease, or unspecified chronic kidney disease: Secondary | ICD-10-CM | POA: Diagnosis present

## 2011-10-01 DIAGNOSIS — Z79899 Other long term (current) drug therapy: Secondary | ICD-10-CM

## 2011-10-01 DIAGNOSIS — F209 Schizophrenia, unspecified: Secondary | ICD-10-CM | POA: Diagnosis present

## 2011-10-01 DIAGNOSIS — Z7982 Long term (current) use of aspirin: Secondary | ICD-10-CM

## 2011-10-01 DIAGNOSIS — R109 Unspecified abdominal pain: Secondary | ICD-10-CM | POA: Diagnosis present

## 2011-10-01 DIAGNOSIS — I10 Essential (primary) hypertension: Secondary | ICD-10-CM | POA: Diagnosis present

## 2011-10-01 DIAGNOSIS — F17200 Nicotine dependence, unspecified, uncomplicated: Secondary | ICD-10-CM | POA: Diagnosis present

## 2011-10-01 DIAGNOSIS — F1410 Cocaine abuse, uncomplicated: Secondary | ICD-10-CM | POA: Diagnosis present

## 2011-10-01 DIAGNOSIS — R188 Other ascites: Secondary | ICD-10-CM | POA: Diagnosis present

## 2011-10-01 DIAGNOSIS — N039 Chronic nephritic syndrome with unspecified morphologic changes: Secondary | ICD-10-CM | POA: Diagnosis present

## 2011-10-01 DIAGNOSIS — F319 Bipolar disorder, unspecified: Secondary | ICD-10-CM | POA: Diagnosis present

## 2011-10-01 DIAGNOSIS — N184 Chronic kidney disease, stage 4 (severe): Secondary | ICD-10-CM | POA: Diagnosis present

## 2011-10-01 DIAGNOSIS — K625 Hemorrhage of anus and rectum: Secondary | ICD-10-CM

## 2011-10-01 HISTORY — DX: Chronic kidney disease, unspecified: N18.9

## 2011-10-01 LAB — COMPREHENSIVE METABOLIC PANEL
Albumin: 2.8 g/dL — ABNORMAL LOW (ref 3.5–5.2)
BUN: 57 mg/dL — ABNORMAL HIGH (ref 6–23)
Chloride: 106 mEq/L (ref 96–112)
Creatinine, Ser: 3.5 mg/dL — ABNORMAL HIGH (ref 0.50–1.35)
GFR calc non Af Amer: 19 mL/min — ABNORMAL LOW (ref >90)
Total Bilirubin: 1.4 mg/dL — ABNORMAL HIGH (ref 0.3–1.2)

## 2011-10-01 LAB — CBC
HCT: 27.7 % — ABNORMAL LOW (ref 39.0–52.0)
MCHC: 33.9 g/dL (ref 30.0–36.0)
MCV: 89.6 fL (ref 78.0–100.0)
RDW: 21.7 % — ABNORMAL HIGH (ref 11.5–15.5)
WBC: 10.9 10*3/uL — ABNORMAL HIGH (ref 4.0–10.5)

## 2011-10-01 LAB — TYPE AND SCREEN
ABO/RH(D): A POS
Antibody Screen: NEGATIVE

## 2011-10-01 MED ORDER — SODIUM CHLORIDE 0.9 % IV SOLN
INTRAVENOUS | Status: DC
  Administered 2011-10-01: 22:00:00 via INTRAVENOUS

## 2011-10-01 MED ORDER — HYDROMORPHONE HCL PF 1 MG/ML IJ SOLN
1.0000 mg | Freq: Once | INTRAMUSCULAR | Status: AC
  Administered 2011-10-01: 1 mg via INTRAVENOUS
  Filled 2011-10-01: qty 1

## 2011-10-01 NOTE — ED Notes (Signed)
Patient with rectal bleeding for last three days on and off, with abdominal pain that started today.  Patient had frank blood with last bowel movement.

## 2011-10-01 NOTE — ED Provider Notes (Signed)
History     CSN: [id number] Arrival date & time: 10/01/2011  9:21 PM   First MD Initiated Contact with Patient 10/01/11 2158      Chief Complaint  Patient presents with  . Rectal Bleeding  . Abdominal Pain    (Consider location/radiation/quality/duration/timing/severity/associated sxs/prior treatment) HPI Complains of epigastric pain moderate to severe gradual in onset yesterday today noted red blood when having a bowel movent. Denies nausea or vomiting or fever. No treatment prior to coming here nothing makes symptoms better or worse pain is constant however has improved somewhat since in the emergency department without treatment Past Medical History  Diagnosis Date  . Hypertension   . Bipolar disorder   . Schizophrenia     h/o hospitalized inpt therapy @ the [facility]  . MVP (mitral valve prolapse)     with severe MR  . Hyperlipidemia   . Bipolar disorder     Attention deficit disorder   renal failure  Past Surgical History  Procedure Date  . Mitral valve repair 2011  . Av fistula placement, radiocephalic 05/24/2011    Left arm    History reviewed. No pertinent family history.  History  Substance Use Topics  . Smoking status: Current Everyday Smoker -- 1.0 packs/day    Types: Cigarettes  . Smokeless tobacco: Not on file  . Alcohol Use: Yes      Review of Systems  Constitutional: Negative.   HENT: Negative.   Respiratory: Negative.   Cardiovascular: Negative.   Gastrointestinal: Positive for abdominal pain and blood in stool.  Musculoskeletal: Negative.   Skin: Negative.   Neurological: Negative.   Hematological: Negative.   Psychiatric/Behavioral: Negative.     Allergies  Food  Home Medications   Current Outpatient Rx  Name Route Sig Dispense Refill  . ARIPIPRAZOLE 5 MG PO TABS Oral Take 5 mg by mouth daily.      . ASPIRIN 81 MG PO TBEC Oral Take 81 mg by mouth daily.      . FUROSEMIDE 80 MG PO TABS Oral Take 1 tablet (80 mg  total) by mouth 2 (two) times daily. 60 tablet 6  . HYDRALAZINE HCL 25 MG PO TABS Oral Take 12.5 mg by mouth 3 (three) times daily. Half tablet three times daily     . LABETALOL HCL 200 MG PO TABS Oral Take 200 mg by mouth daily.      Marland Kitchen RISPERIDONE 3 MG PO TABS Oral Take 3 mg by mouth at bedtime.      . TRAZODONE HCL 50 MG PO TABS Oral Take 50 mg by mouth at bedtime. For sleep       BP 161/112  Pulse 102  Temp(Src) 97.9 F (36.6 C) (Oral)  Resp 20  SpO2 99%  Physical Exam  Nursing note and vitals reviewed. Constitutional: He appears well-developed and well-nourished.  HENT:  Head: Normocephalic and atraumatic.  Eyes: Conjunctivae are normal. Pupils are equal, round, and reactive to light.  Neck: Neck supple. No tracheal deviation present. No thyromegaly present.  Cardiovascular: Normal rate and regular rhythm.   No murmur heard. Pulmonary/Chest: Effort normal and breath sounds normal.  Abdominal: Soft. Bowel sounds are normal. He exhibits no distension. There is tenderness.       Nondistended epigastric tenderness tenderness  is moderate  Genitourinary: Penis normal. Guaiac positive stool.       GrossRed blood per rectum exam, nontender  Musculoskeletal: Normal range of motion. He exhibits no edema and no tenderness.  Neurological:  He is alert. Coordination normal.  Skin: Skin is warm and dry. No rash noted.  Psychiatric: He has a normal mood and affect.    ED Course  Procedures (including critical care time)   Labs Reviewed  CBC  COMPREHENSIVE METABOLIC PANEL  TYPE AND SCREEN   No results found.   No diagnosis found.   Date: 10/01/2011  Rate: 99 Rhythm: normal sinus rhythm  QRS Axis: normal  Intervals: normal  ST/T Wave abnormalities: nonspecific ST/T changes  Conduction Disutrbances:none  Narrative Interpretation:   Old EKG Reviewed: unchanged Unchanged from 01/04/2011  12:10 AM pain is improved after treatment with IV dilaudid he remains are appropriate  Glasgow Coma Score 15 MDM  Case discussed with Dr.Kakrakandy Plan admit telemetry Diagnosis #1 epigastric pain Differential diagnosis includes gastritis versus peptic ulcer disease versus nonspecific Diagnosis #2 GI bleed #3 chronic renal insufficiency #4 anemia    CRITICAL CARE Performed by: Doug Sou   Total critical care time: 30 minutes  Critical care time was exclusive of separately billable procedures and treating other patients.  Critical care was necessary to treat or prevent imminent or life-threatening deterioration.  Critical care was time spent personally by me on the following activities: development of treatment plan with patient and/or surrogate as well as nursing, discussions with consultants, evaluation of patient's response to treatment, examination of patient, obtaining history from patient or surrogate, ordering and performing treatments and interventions, ordering and review of laboratory studies, ordering and review of radiographic studies, pulse oximetry and re-evaluation of patient's condition.  Doug Sou, MD 10/02/11 [phone]

## 2011-10-02 ENCOUNTER — Encounter (HOSPITAL_COMMUNITY): Payer: Self-pay | Admitting: Internal Medicine

## 2011-10-02 ENCOUNTER — Inpatient Hospital Stay (HOSPITAL_COMMUNITY): Payer: Medicaid Other

## 2011-10-02 DIAGNOSIS — N184 Chronic kidney disease, stage 4 (severe): Secondary | ICD-10-CM | POA: Diagnosis present

## 2011-10-02 DIAGNOSIS — K625 Hemorrhage of anus and rectum: Secondary | ICD-10-CM | POA: Diagnosis present

## 2011-10-02 DIAGNOSIS — D631 Anemia in chronic kidney disease: Secondary | ICD-10-CM

## 2011-10-02 DIAGNOSIS — K5900 Constipation, unspecified: Secondary | ICD-10-CM | POA: Diagnosis present

## 2011-10-02 DIAGNOSIS — N039 Chronic nephritic syndrome with unspecified morphologic changes: Secondary | ICD-10-CM

## 2011-10-02 DIAGNOSIS — R188 Other ascites: Secondary | ICD-10-CM | POA: Diagnosis present

## 2011-10-02 DIAGNOSIS — I340 Nonrheumatic mitral (valve) insufficiency: Secondary | ICD-10-CM | POA: Diagnosis present

## 2011-10-02 DIAGNOSIS — R109 Unspecified abdominal pain: Secondary | ICD-10-CM | POA: Diagnosis present

## 2011-10-02 LAB — RAPID URINE DRUG SCREEN, HOSP PERFORMED
Amphetamines: NOT DETECTED
Benzodiazepines: NOT DETECTED
Opiates: NOT DETECTED
Tetrahydrocannabinol: NOT DETECTED

## 2011-10-02 LAB — COMPREHENSIVE METABOLIC PANEL
ALT: 34 U/L (ref 0–53)
Albumin: 2.6 g/dL — ABNORMAL LOW (ref 3.5–5.2)
Alkaline Phosphatase: 76 U/L (ref 39–117)
Calcium: 8.3 mg/dL — ABNORMAL LOW (ref 8.4–10.5)
GFR calc Af Amer: 24 mL/min — ABNORMAL LOW (ref >90)
Potassium: 4.8 mEq/L (ref 3.5–5.1)
Sodium: 136 mEq/L (ref 135–145)
Total Protein: 5.4 g/dL — ABNORMAL LOW (ref 6.0–8.3)

## 2011-10-02 LAB — CBC
HCT: 26.9 % — ABNORMAL LOW (ref 39.0–52.0)
MCH: 30.1 pg (ref 26.0–34.0)
MCHC: 32.9 g/dL (ref 30.0–36.0)
MCHC: 33.5 g/dL (ref 30.0–36.0)
MCV: 92.1 fL (ref 78.0–100.0)
Platelets: 181 10*3/uL (ref 150–400)
RDW: 21.7 % — ABNORMAL HIGH (ref 11.5–15.5)
RDW: 21.3 % — ABNORMAL HIGH (ref 11.5–15.5)
WBC: 7.4 10*3/uL (ref 4.0–10.5)

## 2011-10-02 LAB — LIPASE, BLOOD: Lipase: 33 U/L (ref 11–59)

## 2011-10-02 MED ORDER — HYDRALAZINE HCL 20 MG/ML IJ SOLN
10.0000 mg | INTRAMUSCULAR | Status: DC | PRN
  Filled 2011-10-02: qty 0.5

## 2011-10-02 MED ORDER — AMLODIPINE BESYLATE 5 MG PO TABS
5.0000 mg | ORAL_TABLET | Freq: Every day | ORAL | Status: DC
  Administered 2011-10-02 – 2011-10-03 (×2): 5 mg via ORAL
  Filled 2011-10-02 (×2): qty 1

## 2011-10-02 MED ORDER — LABETALOL HCL 200 MG PO TABS
400.0000 mg | ORAL_TABLET | Freq: Two times a day (BID) | ORAL | Status: DC
  Administered 2011-10-02: 200 mg via ORAL
  Administered 2011-10-02 – 2011-10-03 (×3): 400 mg via ORAL
  Filled 2011-10-02 (×5): qty 2

## 2011-10-02 MED ORDER — PANTOPRAZOLE SODIUM 40 MG IV SOLR
40.0000 mg | Freq: Two times a day (BID) | INTRAVENOUS | Status: DC
  Administered 2011-10-02 – 2011-10-03 (×4): 40 mg via INTRAVENOUS
  Filled 2011-10-02 (×5): qty 40

## 2011-10-02 MED ORDER — HYDROMORPHONE HCL PF 1 MG/ML IJ SOLN
1.0000 mg | INTRAMUSCULAR | Status: DC | PRN
  Administered 2011-10-02: 1 mg via INTRAVENOUS
  Filled 2011-10-02: qty 1

## 2011-10-02 MED ORDER — TRAZODONE HCL 50 MG PO TABS
50.0000 mg | ORAL_TABLET | Freq: Every day | ORAL | Status: DC
  Administered 2011-10-02 (×2): 50 mg via ORAL
  Filled 2011-10-02 (×3): qty 1

## 2011-10-02 MED ORDER — ACETAMINOPHEN 650 MG RE SUPP: 650.0000 mg | Freq: Four times a day (QID) | RECTAL | Status: DC | PRN

## 2011-10-02 MED ORDER — LABETALOL HCL 200 MG PO TABS: 200.0000 mg | ORAL_TABLET | Freq: Every day | ORAL | Status: DC

## 2011-10-02 MED ORDER — ONDANSETRON HCL 4 MG PO TABS: 4.0000 mg | ORAL_TABLET | Freq: Four times a day (QID) | ORAL | Status: DC | PRN

## 2011-10-02 MED ORDER — VITAMIN C 500 MG PO TABS
500.0000 mg | ORAL_TABLET | Freq: Every day | ORAL | Status: DC
  Administered 2011-10-02 – 2011-10-03 (×2): 500 mg via ORAL
  Filled 2011-10-02 (×2): qty 1

## 2011-10-02 MED ORDER — ACETAMINOPHEN 325 MG PO TABS: 650.0000 mg | ORAL_TABLET | Freq: Four times a day (QID) | ORAL | Status: DC | PRN

## 2011-10-02 MED ORDER — ARIPIPRAZOLE 5 MG PO TABS
5.0000 mg | ORAL_TABLET | Freq: Every day | ORAL | Status: DC
  Administered 2011-10-02 – 2011-10-03 (×2): 5 mg via ORAL
  Filled 2011-10-02 (×2): qty 1

## 2011-10-02 MED ORDER — HYDRALAZINE HCL 25 MG PO TABS
12.5000 mg | ORAL_TABLET | Freq: Three times a day (TID) | ORAL | Status: DC
  Administered 2011-10-02 – 2011-10-03 (×4): 12.5 mg via ORAL
  Filled 2011-10-02 (×6): qty 0.5

## 2011-10-02 MED ORDER — HYDROCHLOROTHIAZIDE 25 MG PO TABS
25.0000 mg | ORAL_TABLET | Freq: Every day | ORAL | Status: DC
  Administered 2011-10-02 – 2011-10-03 (×2): 25 mg via ORAL
  Filled 2011-10-02 (×2): qty 1

## 2011-10-02 MED ORDER — FUROSEMIDE 80 MG PO TABS
80.0000 mg | ORAL_TABLET | Freq: Two times a day (BID) | ORAL | Status: DC
  Administered 2011-10-02 – 2011-10-03 (×2): 80 mg via ORAL
  Filled 2011-10-02 (×5): qty 1

## 2011-10-02 MED ORDER — SODIUM CHLORIDE 0.9 % IJ SOLN
3.0000 mL | Freq: Two times a day (BID) | INTRAMUSCULAR | Status: DC
  Administered 2011-10-02 – 2011-10-03 (×4): 3 mL via INTRAVENOUS

## 2011-10-02 MED ORDER — ONDANSETRON HCL 4 MG/2ML IJ SOLN: 4.0000 mg | Freq: Four times a day (QID) | INTRAMUSCULAR | Status: DC | PRN

## 2011-10-02 MED ORDER — METOPROLOL TARTRATE 50 MG PO TABS
50.0000 mg | ORAL_TABLET | Freq: Two times a day (BID) | ORAL | Status: DC
  Administered 2011-10-02 – 2011-10-03 (×4): 50 mg via ORAL
  Filled 2011-10-02 (×5): qty 1

## 2011-10-02 MED ORDER — RISPERIDONE 3 MG PO TABS
3.0000 mg | ORAL_TABLET | Freq: Every day | ORAL | Status: DC
  Administered 2011-10-02 (×2): 3 mg via ORAL
  Filled 2011-10-02 (×3): qty 1

## 2011-10-02 MED ORDER — THERA M PLUS PO TABS
1.0000 | ORAL_TABLET | Freq: Every day | ORAL | Status: DC
  Administered 2011-10-02 – 2011-10-03 (×2): 1 via ORAL
  Filled 2011-10-02 (×2): qty 1

## 2011-10-02 NOTE — H&P (Signed)
Ryan Winters is an 50 y.o. male.   Chief Complaint: Abdominal pain and rectal bleed HPI: 50 year old male with H/O CKD, mitral annuloplasty, chronic anemia, hypertension, polysubstance abuse including cocaine present with abdominal pain and eventually had rectal bleed. Patient states that the abdominal pain was epigastric in origin then became diffuse. It lasted till patient reached the ER and got better after he got a dose of dilaudid. Denies any associated nausea or vomiting or diarrhea. He had one episode of rectal bleed later in the evening. He states it was bloody and not had before. Denies any weight loss, fever or chills. ER physician Dr Ethelda Chick who did a ectal noticed frank blood patient will be admitted for further work up.Denies any chest pain or shortness of breath or headache. In addition his blood pressure is noted to be elevated.  Past Medical History  Diagnosis Date  . Hypertension   . Bipolar disorder   . Schizophrenia     h/o hospitalized inpt therapy @ the [facility]  . MVP (mitral valve prolapse)     with severe MR  . Hyperlipidemia   . Bipolar disorder     Attention deficit disorder  . Chronic kidney disease     Past Surgical History  Procedure Date  . Mitral valve repair 2011  . Av fistula placement, radiocephalic 05/24/2011    Left arm    Family History  Problem Relation Age of Onset  . Hypertension Mother    Social History:  reports that he has been smoking Cigarettes.  He has been smoking about 1 pack per day. He does not have any smokeless tobacco history on file. He reports that he drinks alcohol. He reports that he uses illicit drugs (Cocaine and Marijuana).  Allergies:  Allergies  Allergen Reactions  . Food Swelling    Strawberries, Cashews    Medications Prior to Admission  Medication Dose Route Frequency Provider Last Rate Last Dose  . 0.9 %  sodium chloride infusion   Intravenous Continuous Doug Sou, MD 125 mL/hr at  10/01/11 2215    . HYDROmorphone (DILAUDID) injection 1 mg  1 mg Intravenous Once Doug Sou, MD   1 mg at 10/01/11 2215   Medications Prior to Admission  Medication Sig Dispense Refill  . ARIPiprazole (ABILIFY) 5 MG tablet Take 5 mg by mouth daily.        Marland Kitchen aspirin 81 MG EC tablet Take 81 mg by mouth daily.        . furosemide (LASIX) 80 MG tablet Take 1 tablet (80 mg total) by mouth 2 (two) times daily.  60 tablet  6    Results for orders placed during the hospital encounter of 10/01/11 (from the past 48 hour(s))  CBC     Status: Abnormal   Collection Time   10/01/11 10:04 PM      Component Value Range Comment   WBC 10.9 (*) 4.0 - 10.5 (K/uL)    RBC 3.09 (*) 4.22 - 5.81 (MIL/uL)    Hemoglobin 9.4 (*) 13.0 - 17.0 (g/dL)    HCT 16.1 (*) 09.6 - 52.0 (%)    MCV 89.6  78.0 - 100.0 (fL)    MCH 30.4  26.0 - 34.0 (pg)    MCHC 33.9  30.0 - 36.0 (g/dL)    RDW 04.5 (*) 40.9 - 15.5 (%)    Platelets 171  150 - 400 (K/uL)   COMPREHENSIVE METABOLIC PANEL     Status: Abnormal   Collection  Time   10/01/11 10:04 PM      Component Value Range Comment   Sodium 137  135 - 145 (mEq/L)    Potassium 4.3  3.5 - 5.1 (mEq/L)    Chloride 106  96 - 112 (mEq/L)    CO2 22  19 - 32 (mEq/L)    Glucose, Bld 100 (*) 70 - 99 (mg/dL)    BUN 57 (*) 6 - 23 (mg/dL)    Creatinine, Ser 7.82 (*) 0.50 - 1.35 (mg/dL)    Calcium 8.8  8.4 - 10.5 (mg/dL)    Total Protein 5.8 (*) 6.0 - 8.3 (g/dL)    Albumin 2.8 (*) 3.5 - 5.2 (g/dL)    AST 69 (*) 0 - 37 (U/L)    ALT 38  0 - 53 (U/L)    Alkaline Phosphatase 89  39 - 117 (U/L)    Total Bilirubin 1.4 (*) 0.3 - 1.2 (mg/dL)    GFR calc non Af Amer 19 (*) >90 (mL/min)    GFR calc Af Amer 22 (*) >90 (mL/min)   TYPE AND SCREEN     Status: Normal   Collection Time   10/01/11 10:06 PM      Component Value Range Comment   ABO/RH(D) A POS      Antibody Screen NEG      Sample Expiration 10/04/2011      No results found.  Review of Systems  Constitutional: Negative.     HENT: Negative.   Eyes: Negative.   Respiratory: Negative.   Cardiovascular: Negative.   Gastrointestinal: Positive for abdominal pain and blood in stool.  Genitourinary: Negative.   Musculoskeletal: Negative.   Skin: Negative.   Neurological: Negative.   Endo/Heme/Allergies: Negative.   Psychiatric/Behavioral: Negative.     Blood pressure 154/112, pulse 96, temperature 97.9 F (36.6 C), temperature source Oral, resp. rate 23, SpO2 98.00%. Physical Exam  Constitutional: He is oriented to person, place, and time. He appears well-developed and well-nourished.  HENT:  Head: Normocephalic and atraumatic.  Eyes: Conjunctivae and EOM are normal. Pupils are equal, round, and reactive to light.  Neck: Normal range of motion. Neck supple.  Cardiovascular: Normal rate and regular rhythm.   Respiratory: Effort normal and breath sounds normal.  GI: Soft. Bowel sounds are normal.  Musculoskeletal: Normal range of motion.  Neurological: He is alert and oriented to person, place, and time. He has normal reflexes.  Skin: Skin is warm and dry.  Psychiatric: His behavior is normal.     Assessment/Plan 1)Rectal bleeding 2)Abdominal pain 3)Anemia, Chronic 4)Hypertension uncontrolled 5)CKD 6)H/O Polysubstance abuse 7)H/O Mitral valve repair  Plan: Will admit to telemetry Will recheck CBC in few hours and transfuse if there is significant drop in hemoglobin. Will keep patient on clear liquid diet in anticipation of possible GI procedure. Will consult GI in AM. pLACE PATIENT ON PROTONIX. For abdominal pain, will check lipase get CT abdomen and pelvis with PO contrast only. Continue his antihypertensive medications and place on PRN iv Hydralazine. Check urine drug screen.   [name]. 10/02/2011, 1:04 AM

## 2011-10-02 NOTE — Consult Note (Signed)
[facility] Consultation  Referring Provider: [facility] Primary Care Physician:  No primary provider on file. Primary Gastroenterologist:  None  Reason for Consultation:  Gastrointestinal bleed  HPI: Ryan Winters is a 50 y.o. male with multiple medical problems including, but not limited to:  CKD, HTN, mitral valve annuloplasty, Schizophrenia, and polysubstance abuse. Patient developed admitted with abdominal pain and rectal bleeding. Patient is very drowzy, received Dilaudid early this am and so he doesn't provide a good history. Apparently patient had a bowel movement with contained blood. He subsequently developed diffuse lower abdominal pain. Patient admits to being constipated prior to passage of blood. He hasn't had any diarrhea. Other than constipation he denies any chronic GI problems.   Past Medical History  Diagnosis Date  . Hypertension   . Bipolar disorder   . Schizophrenia     h/o hospitalized inpt therapy @ the [facility]  . MVP (mitral valve prolapse)     with severe MR  . Hyperlipidemia   . Bipolar disorder     Attention deficit disorder  . Chronic kidney disease     Past Surgical History  Procedure Date  . Mitral valve repair 2011  . Av fistula placement, radiocephalic 05/24/2011    Left arm    Prior to Admission medications   Medication Sig Start Date End Date Taking? Authorizing Provider  ARIPiprazole (ABILIFY) 5 MG tablet Take 5 mg by mouth daily.     Yes Historical Provider, MD  aspirin 81 MG EC tablet Take 81 mg by mouth daily.     Yes Historical Provider, MD  furosemide (LASIX) 80 MG tablet Take 1 tablet (80 mg total) by mouth 2 (two) times daily. 03/08/11  Yes Micheline Stirling, MD  hydrALAZINE (APRESOLINE) 25 MG tablet Take 12.5 mg by mouth 3 (three) times daily. Half tablet three times daily    Yes Historical Provider, MD  labetalol (NORMODYNE) 200 MG tablet Take 200 mg by mouth daily.     Yes Historical Provider, MD    risperiDONE (RISPERDAL) 3 MG tablet Take 3 mg by mouth at bedtime.     Yes Historical Provider, MD  traZODone (DESYREL) 50 MG tablet Take 50 mg by mouth at bedtime. For sleep    Yes Historical Provider, MD    Current Facility-Administered Medications  Medication Dose Route Frequency Provider Last Rate Last Dose  . acetaminophen (TYLENOL) tablet 650 mg  650 mg Oral Q6H PRN Eduard Clos       Or  . acetaminophen (TYLENOL) suppository 650 mg  650 mg Rectal Q6H PRN Eduard Clos      . amLODipine (NORVASC) tablet 5 mg  5 mg Oral Daily Arshad N. Kakrakandy   5 mg at 10/02/11 1007  . ARIPiprazole (ABILIFY) tablet 5 mg  5 mg Oral Daily Arshad N. Kakrakandy   5 mg at 10/02/11 1008  . furosemide (LASIX) tablet 80 mg  80 mg Oral BID Eduard Clos      . hydrALAZINE (APRESOLINE) injection 10 mg  10 mg Intravenous Q4H PRN Eduard Clos      . hydrALAZINE (APRESOLINE) tablet 12.5 mg  12.5 mg Oral TID Meryle Ready. Kakrakandy   12.5 mg at 10/02/11 1008  . hydrochlorothiazide (HYDRODIURIL) tablet 25 mg  25 mg Oral Daily Arshad N. Kakrakandy   25 mg at 10/02/11 1008  . HYDROmorphone (DILAUDID) injection 1 mg  1 mg Intravenous Once Doug Sou, MD   1 mg at 10/01/11 2215  .  HYDROmorphone (DILAUDID) injection 1 mg  1 mg Intravenous Q4H PRN Eduard Clos   1 mg at 10/02/11 0305  . labetalol (NORMODYNE) tablet 400 mg  400 mg Oral BID Arshad N. Kakrakandy   400 mg at 10/02/11 1007  . metoprolol (LOPRESSOR) tablet 50 mg  50 mg Oral BID Arshad N. Kakrakandy   50 mg at 10/02/11 1007  . multivitamins ther. w/minerals tablet 1 tablet  1 tablet Oral Daily Eduard Clos   1 tablet at 10/02/11 1000  . ondansetron (ZOFRAN) tablet 4 mg  4 mg Oral Q6H PRN Eduard Clos       Or  . ondansetron (ZOFRAN) injection 4 mg  4 mg Intravenous Q6H PRN Eduard Clos      . pantoprazole (PROTONIX) injection 40 mg  40 mg Intravenous Q12H Arshad N. Kakrakandy   40 mg at 10/02/11  1009  . risperiDONE (RISPERDAL) tablet 3 mg  3 mg Oral QHS Arshad N. Kakrakandy   3 mg at 10/02/11 0306  . sodium chloride 0.9 % injection 3 mL  3 mL Intravenous Q12H Arshad N. Kakrakandy   3 mL at 10/02/11 0145  . traZODone (DESYREL) tablet 50 mg  50 mg Oral QHS Arshad N. Kakrakandy   50 mg at 10/02/11 0307  . vitamin C (ASCORBIC ACID) tablet 500 mg  500 mg Oral Daily Arshad N. Kakrakandy   500 mg at 10/02/11 1008  . DISCONTD: 0.9 %  sodium chloride infusion   Intravenous Continuous Doug Sou, MD 125 mL/hr at 10/01/11 2215    . DISCONTD: labetalol (NORMODYNE) tablet 200 mg  200 mg Oral Daily Arshad N. Kakrakandy        Allergies as of 10/01/2011 - Review Complete 10/01/2011  Allergen Reaction Noted  . Food Swelling 06/20/2011    Family History  Problem Relation Age of Onset  . Hypertension Mother         No [facility] of colon cancer.  History   Social History  . Marital Status: Married    Spouse Name: N/A    Number of Children: N/A  . Years of Education: N/A   Occupational History  . disabled    Social History Main Topics  . Smoking status: Current Everyday Smoker -- 1.0 packs/day    Types: Cigarettes  . Smokeless tobacco: Not on file  . Alcohol Use: Yes  . Drug Use: Yes    Special: Cocaine, Marijuana  . Sexually Active:    Other Topics Concern  . Not on file   Social History Narrative  . No narrative on file    Review of Systems: Reviewed the H&P ROS and negative except for abdominal pain and blood in stool. Repeat ROS not done, patient keeps falling asleep during consultation.  Physical Exam: Vital signs in last 24 hours: Temp:  [97.7 F (36.5 C)-98.2 F (36.8 C)] 97.8 F (36.6 C) (11/13 1005) Pulse Rate:  [63-102] 69  (11/13 1005) Resp:  [15-23] 20  (11/13 1005) BP: (99-165)/(58-119) 104/66 mmHg (11/13 1005) SpO2:  [93 %-100 %] 93 % (11/13 0716) Weight:  [66.9 kg (147 lb 7.8 oz)] 147 lb 7.8 oz (66.9 kg) (11/13 0135) Last BM Date: 10/02/11 General:    Alert,  well-developed, black male NAD Head:  Normocephalic and atraumatic. Eyes:   no icterus.   Conjunctiva pink. Ears:  Normal auditory acuity. Neck:  Supple; no masses felt Lungs:  Respirations even and unlabored. Lungs clear to auscultation bilaterlly.   No wheezes, crackles,  or rhonchi.  Heart:  Regular rate and rhythm Abdomen:  Soft, nondistended, nontender. No masses, hepatomegaly or obvious hernias noted. Normal bowel sounds.No abdominal guarding. Rectal:  Very scant amount of thin light brown secretions. No external hemorrhoids or fissures seen.  Msk:  Symmetrical without gross deformities.  Extremities:  Without edema. Neurologic:  Groggy Skin:  Intact without significant lesions or rashes. Cervical Nodes:  No significant cervical adenopathy. Psych:  Pleasant and cooperative in groggy state.  Intake/Output from previous day: 11/12 0701 - 11/13 0700 In: 602.1 [I.V.:602.1] Out: 251 [Urine:250; Stool:1] Intake/Output this shift:    Lab Results:  [facility] 10/02/11 0640 10/01/11 2204  WBC 8.1 10.9*  HGB 9.4* 9.4*  HCT 28.6* 27.7*  PLT 204 171   BMET  Basename 10/02/11 0640 10/01/11 2204  NA 136 137  K 4.8 4.3  CL 108 106  CO2 20 22  GLUCOSE 110* 100*  BUN 51* 57*  CREATININE 3.26* 3.50*  CALCIUM 8.3* 8.8   LFT  Basename 10/02/11 0640  PROT 5.4*  ALBUMIN 2.6*  AST 57*  ALT 34  ALKPHOS 76  BILITOT 1.0  BILIDIR --  IBILI --    Previous Endoscopies: none  Impression / Plan: 1.  Abdominal pain /self limited rectal bleeding in what sounds like setting of constipation. Suspect perianal etiology (?internal hemorrhoids). Patient has chronic anemia, hemoglobin is stable. Perhaps his abdominal pain is from constipation though he reports a fairly normal BM today. Patient is positive for cocaine so ischemic colitis is always consideration though amount of bleeding doesn't seem to be compatible with this. His abdominal exam is benign, WBC is normal and patient  afebrile. Will need bowel regimen to prevent constipation, trial of steroid suppositories in case this is internal hemorrhoidal bleeding. Will need colonoscopy at some point (age 52), timing per Dr. Leone Payor.  2. CKD, s/p graft placement for dialysis. 3. Refer to [facility].  Thanks   LOS: 1 day   Willette Cluster  10/02/2011, 10:45 AM

## 2011-10-02 NOTE — Consult Note (Signed)
Rectal bleeding associated with constipation. Both have been transient and resolved. He has anemia of chronic kidney disease.  Plan: Outpatient colonoscopy with propofol sedation Home tomorrow unless something changes My extender will arrange for direct colonoscopy with propofol through [facility]

## 2011-10-02 NOTE — Progress Notes (Signed)
UTILIZATION REVIEW COMPLETE Ryan Winters 10/02/2011 [phone] OR [phone]  

## 2011-10-02 NOTE — Progress Notes (Signed)
Subjective: Patient states that he states that his precipitating complaint is abdominal pain. He does admit to seeing bright red blood with stool however he states that that was a very small amount. Patient states that the pain resolved however he did have some onset of pain again he had some broth which quickly resolved itself. The patient is really unable to quantify it qualifies his pain with any degree of accuracy. He states that he had a recent bowel movement without any blood in the stool. The patient also relates that he has significant PND which causes him to sleep in an upright position. He states that there's been no significant change in the height in which he has to sleep since he had his mitral valve repair several years ago. He states that he has followup with cardiothoracic surgery since his mitral valve repair. Objective: Filed Vitals:   10/02/11 0325 10/02/11 0716 10/02/11 0849 10/02/11 1005  BP: 143/99 99/65 99/58  104/66  Pulse: 92 63  69  Temp:  97.7 F (36.5 C)  97.8 F (36.6 C)  TempSrc:  Oral  Oral  Resp:  20  20  Height:      Weight:      SpO2:  93%     Weight change:   Intake/Output Summary (Last 24 hours) at 10/02/11 1604 Last data filed at 10/02/11 1312  Gross per 24 hour  Intake 602.08 ml  Output    551 ml  Net  51.08 ml    General: Alert, awake, oriented x3, in no acute distress.  HEENT: [place]/AT PEERL, EOMI Neck: Trachea midline,  no masses, no thyromegal,y no JVD, no carotid bruit OROPHARYNX:  Moist, No exudate/ erythema/lesions.  Heart: Regular rate and rhythm, without murmurs, rubs, gallops, PMI non-displaced, no heaves or thrills on palpation.  Lungs: Clear to auscultation, no wheezing or rhonchi noted. No increased vocal fremitus resonant to percussion  Abdomen: Soft, nontender, nondistended, positive bowel sounds, no masses no hepatosplenomegaly noted..  Neuro: No focal neurological deficits noted cranial nerves II through XII grossly intact. DTRs 2+  bilaterally upper and lower extremities. Strength 5 out of 5 in bilateral upper and lower extremities. Musculoskeletal: No warm swelling or erythema around joints, no spinal tenderness noted. Psychiatric: Patient alert and oriented x3, good insight and cognition, good recent to remote recall. Lymph node survey: No cervical axillary or inguinal lymphadenopathy noted.     Lab Results:  [facility] 10/02/11 0640 10/01/11 2204  NA 136 137  K 4.8 4.3  CL 108 106  CO2 20 22  GLUCOSE 110* 100*  BUN 51* 57*  CREATININE 3.26* 3.50*  CALCIUM 8.3* 8.8  MG -- --  PHOS -- --    Basename 10/02/11 0640 10/01/11 2204  AST 57* 69*  ALT 34 38  ALKPHOS 76 89  BILITOT 1.0 1.4*  PROT 5.4* 5.8*  ALBUMIN 2.6* 2.8*    Basename 10/02/11 0640  LIPASE 33  AMYLASE --    Basename 10/02/11 1408 10/02/11 0640  WBC 7.4 8.1  NEUTROABS -- --  HGB 9.0* 9.4*  HCT 26.9* 28.6*  MCV 92.1 91.7  PLT 181 204   No results found for this basename: CKTOTAL:3,CKMB:3,CKMBINDEX:3,TROPONINI:3 in the last 72 hours No results found for this basename: POCBNP:3 in the last 72 hours No results found for this basename: DDIMER:2 in the last 72 hours No results found for this basename: HGBA1C:2 in the last 72 hours No results found for this basename: CHOL:2,HDL:2,LDLCALC:2,TRIG:2,CHOLHDL:2,LDLDIRECT:2 in the last 72 hours No results  found for this basename: TSH,T4TOTAL,FREET3,T3FREE,THYROIDAB in the last 72 hours No results found for this basename: VITAMINB12:2,FOLATE:2,FERRITIN:2,TIBC:2,IRON:2,RETICCTPCT:2 in the last 72 hours  Micro Results: No results found for this or any previous visit (from the past 240 hour(s)).  Studies/Results: Ct Abdomen Pelvis Wo Contrast  10/02/2011  *RADIOLOGY REPORT*  Clinical Data: Diffuse abdominal pain.  Rectal bleeding. Polysubstance abuse.  Renal failure.  CT ABDOMEN AND PELVIS WITHOUT CONTRAST  Technique:  Multidetector CT imaging of the abdomen and pelvis was performed following  the standard protocol without intravenous contrast.  Comparison: None  Findings: Mild ascites is seen as well as diffuse mesenteric body wall edema.  High attenuation material is noted within the gallbladder and diffuse gallbladder wall thickening is seen which is nonspecific.  While this may be related to the body wall mesenteric edema, cholecystitis cannot definitely be excluded. Periportal edema is seen within the liver, however the liver is otherwise unremarkable appearance on this noncontrast study.  The other abdominal parenchymal organs are unremarkable on this noncontrast exam.  No evidence of splenomegaly.  No soft tissue masses are identified.  No evidence of dilated bowel loops.  Lung windows show airspace disease in the right lower lobe, suspicious for pneumonia.  There are no recent chest radiographs available for comparison.  IMPRESSION:  1.  Mild ascites and diffuse body wall and mesenteric edema, which is nonspecific. 2.  Nonspecific diffuse gallbladder wall thickening, which may be related to the diffuse mesenteric body wall edema, with cholecystitis considered less likely.  Recommend correlation with physical exam findings; nuclear medicine hepatobiliary scan could performed for further evaluation if clinically warranted. 3.  Right lower lobe airspace disease, suspicious for pneumonia. Correlation with chest radiograph is recommended.  Original Report Authenticated By: Danae Orleans, M.D.    Medications: I have reviewed the patient's current medications. Scheduled Meds:   . amLODipine  5 mg Oral Daily  . ARIPiprazole  5 mg Oral Daily  . furosemide  80 mg Oral BID  . hydrALAZINE  12.5 mg Oral TID  . hydrochlorothiazide  25 mg Oral Daily  .  HYDROmorphone (DILAUDID) injection  1 mg Intravenous Once  . labetalol  400 mg Oral BID  . metoprolol  50 mg Oral BID  . multivitamins ther. w/minerals  1 tablet Oral Daily  . pantoprazole (PROTONIX) IV  40 mg Intravenous Q12H  . risperiDONE  3 mg  Oral QHS  . sodium chloride  3 mL Intravenous Q12H  . traZODone  50 mg Oral QHS  . vitamin C  500 mg Oral Daily  . DISCONTD: labetalol  200 mg Oral Daily   Continuous Infusions:   . DISCONTD: sodium chloride 125 mL/hr at 10/01/11 2215   PRN Meds:.acetaminophen, acetaminophen, hydrALAZINE, HYDROmorphone, ondansetron (ZOFRAN) IV, ondansetron Assessment/Plan: Patient Active Hospital Problem List: Rectal bleeding (10/02/2011)   Assessment: Rectal bleeding appears to be self-limited. His hemoglobin has remained stable. I am presently awaiting final recommendations from gastroenterology as to whether or not they feel that he needs to have any testing done in the hospital.   Abdominal pain (10/02/2011)   Assessment: Abdominal pain has been resolved.     Anemia in chronic kidney disease (10/16/2010)   Assessment: The patient has anemia and chronic kidney disease his and hemoglobin appears to be stable.    CKD (chronic kidney disease), stage IV (10/02/2011)   Assessment: Creatinine is stable based on previous creatinines.   Mitral valvular regurgitation (10/02/2011)   Assessment: The patient appears to be clinically compensated  and unchanged from his normal level of function.    Ascites (10/02/2011)   Assessment: Ascites has been noted on the CT scan. The patient has not noticed any change in his birth or his weight. I suspect that the patient has chronic ascites associated with his mitral valve regurgitation.    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    HYPERTENSION (06/14/2010)  the patient has a history of hypertension although blood pressures are low. The patient does not appear to have any symptoms as a result of his low blood pressure.   DVT prophylaxis: SCDs      LOS: 1 day

## 2011-10-03 MED ORDER — METOPROLOL TARTRATE 50 MG PO TABS: 50.0000 mg | ORAL_TABLET | Freq: Two times a day (BID) | ORAL | Status: DC

## 2011-10-03 MED ORDER — OMEGA-3 FATTY ACIDS 1000 MG PO CAPS: 2.0000 g | ORAL_CAPSULE | Freq: Every day | ORAL | Status: DC

## 2011-10-03 MED ORDER — VITAMIN C 500 MG PO TABS: 500.0000 mg | ORAL_TABLET | Freq: Every day | ORAL | Status: DC

## 2011-10-03 MED ORDER — ONE-DAILY MULTI VITAMINS PO TABS: 1.0000 | ORAL_TABLET | Freq: Every day | ORAL | Status: DC

## 2011-10-03 MED ORDER — AMLODIPINE BESYLATE 5 MG PO TABS: 5.0000 mg | ORAL_TABLET | Freq: Every day | ORAL | Status: DC

## 2011-10-03 MED ORDER — HYDROCHLOROTHIAZIDE 25 MG PO TABS: 25.0000 mg | ORAL_TABLET | Freq: Every day | ORAL | Status: DC

## 2011-10-03 NOTE — Progress Notes (Signed)
Pt's tele d/c, pt's IV d/c. Discharge instructions reviewed with patient and patient verbalizes understanding of discharge instructions and medications.  Follow up visits discussed with patient.____________________________________________________________________D. Manson Passey RN

## 2011-10-03 NOTE — Progress Notes (Signed)
Agree with plan as outlined.

## 2011-10-03 NOTE — Discharge Summary (Signed)
Ryan Winters MRN: [id number] DOB/AGE: 1961-08-04 50 y.o.  Admit date: 10/01/2011 Discharge date: 10/03/2011  Primary Care Physician:  Corine Shelter. MD.   Discharge Diagnoses:   Patient Active Problem List  Diagnoses  . Anemia in chronic kidney disease  . SCHIZOPHRENIA  . BIPOLAR AFFECTIVE DISORDER  . HYPERTENSION  . Mitral valve disorders  . ACUTE ON CHRONIC DIASTOLIC HEART FAILURE  . HEMATURIA UNSPECIFIED  . CARDIAC MURMUR  . COCAINE ABUSE, HX OF  . Rectal bleeding  . Abdominal pain  . CKD (chronic kidney disease), stage IV  . Mitral valvular regurgitation  . Ascites  . Constipation    DISCHARGE MEDICATION: Current Discharge Medication List    CONTINUE these medications which have NOT CHANGED   Details  ARIPiprazole (ABILIFY) 5 MG tablet Take 5 mg by mouth daily.      aspirin 81 MG EC tablet Take 81 mg by mouth daily.      furosemide (LASIX) 80 MG tablet Take 1 tablet (80 mg total) by mouth 2 (two) times daily. Qty: 60 tablet, Refills: 6   Associated Diagnoses: Acute on chronic diastolic heart failure    hydrALAZINE (APRESOLINE) 25 MG tablet Take 12.5 mg by mouth 3 (three) times daily. Half tablet three times daily     labetalol (NORMODYNE) 200 MG tablet Take 200 mg by mouth daily.      risperiDONE (RISPERDAL) 3 MG tablet Take 3 mg by mouth at bedtime.      traZODone (DESYREL) 50 MG tablet Take 50 mg by mouth at bedtime. For sleep       STOP taking these medications     amLODipine (NORVASC) 5 MG tablet      Ascorbic Acid (VITAMIN C) 500 MG tablet      fish oil-omega-3 fatty acids 1000 MG capsule      hydrochlorothiazide (HYDRODIURIL) 25 MG tablet      metoprolol (LOPRESSOR) 50 MG tablet      Multiple Vitamin (MULTIVITAMIN) tablet            Consults:   Dr. Leone Payor M.D.-gastroenterology   SIGNIFICANT DIAGNOSTIC STUDIES:  Ct Abdomen Pelvis Wo Contrast  10/02/2011  *RADIOLOGY REPORT*  Clinical Data: Diffuse abdominal pain.  Rectal  bleeding. Polysubstance abuse.  Renal failure.  CT ABDOMEN AND PELVIS WITHOUT CONTRAST  Technique:  Multidetector CT imaging of the abdomen and pelvis was performed following the standard protocol without intravenous contrast.  Comparison: None  Findings: Mild ascites is seen as well as diffuse mesenteric body wall edema.  High attenuation material is noted within the gallbladder and diffuse gallbladder wall thickening is seen which is nonspecific.  While this may be related to the body wall mesenteric edema, cholecystitis cannot definitely be excluded. Periportal edema is seen within the liver, however the liver is otherwise unremarkable appearance on this noncontrast study.  The other abdominal parenchymal organs are unremarkable on this noncontrast exam.  No evidence of splenomegaly.  No soft tissue masses are identified.  No evidence of dilated bowel loops.  Lung windows show airspace disease in the right lower lobe, suspicious for pneumonia.  There are no recent chest radiographs available for comparison.  IMPRESSION:  1.  Mild ascites and diffuse body wall and mesenteric edema, which is nonspecific. 2.  Nonspecific diffuse gallbladder wall thickening, which may be related to the diffuse mesenteric body wall edema, with cholecystitis considered less likely.  Recommend correlation with physical exam findings; nuclear medicine hepatobiliary scan could performed for further evaluation if clinically warranted. 3.  Right lower lobe airspace disease, suspicious for pneumonia. Correlation with chest radiograph is recommended.  Original Report Authenticated By: Danae Orleans, M.D.          BRIEF ADMITTING H & P: HPI: 50 year old male with H/O CKD, mitral annuloplasty, chronic anemia, hypertension, polysubstance abuse including cocaine present with abdominal pain and eventually had rectal bleed. Patient states that the abdominal pain was epigastric in origin then became diffuse. It lasted till patient reached  the ER and got better after he got a dose of dilaudid. Denies any associated nausea or vomiting or diarrhea. He had one episode of rectal bleed later in the evening. He states it was bloody and not had before. Denies any weight loss, fever or chills. ER physician Dr Ethelda Chick who did a ectal noticed frank blood patient will be admitted for further work up.Denies any chest pain or shortness of breath or headache.  In addition his blood pressure is noted to be elevated.    Hospital Course:  Present on Admission:  .Rectal bleeding: The patient had self-limited rectal bleeding. His hemoglobin was stable. He was seen by gastroenterology about the chest are recommended that the patient had his diet advanced and she tolerated without any difficulty. He also recommends the patient followed in the outpatient setting for a screening and diagnostic colonoscopy.  .Abdominal pain: Patient's abdominal pain is self-limited presently the patient has no pain.  .Anemia in chronic kidney disease: The patient's hemoglobin is stable at about 9 which is his baseline as reported by nephrology.  Marland KitchenSCHIZOPHRENIA: Patient had a stable after during this hospitalization. No evidence of psychoses  .HYPERTENSION: Blood pressures were well-controlled during his hospitalization.  .CKD (chronic kidney disease), stage IV: Stable.  .Mitral valvular regurgitation: The patient has orthopnea PND from his mitral valvular regurgitation. This however has been unchanged since the surgery. The patient should have followup with cardiothoracic surgery as an outpatient for surveillance evaluation of left ventricular architecture particularly in light of the fact that he states that he still has some leakage from the valve. .Ascites: This is noted on the CT scan. The patient appears well compensated I suspect that this is chronic going to his mitral valve regurgitation.  .Constipation: The patient was given a bowel regimen and has had no  difficulty with evacuation of stool since then.  Disposition and Follow-up: The patient is being discharged home. He's to followup with his primary care physician Dr. Luisa Hart within 3-5 days. The patient is also followup with gastroenterology for the screening and diagnostic colonoscopy. Discharge Orders    Future Appointments: Provider: Department: Dept Phone: Center:   10/19/2011 1:30 PM [facility]m [facility] [phone] [facility]   10/26/2011 2:30 PM Iva Boop, MD [facility] [phone] [facility]      DISCHARGE EXAM: Blood pressure 109/64 pulse 75 temperature 97.4 F (36.3 C),  Oral resp. rate 18 height 5\' 3"  (1.6 m) weight 67.631 kg (149 lb 1.6 oz) SpO2 100.00%.  General: Alert, awake, oriented x3, in no acute distress.  HEENT: [place]/AT PEERL, EOMI  Neck: Trachea midline, no masses, no thyromegal,y no JVD, no carotid bruit  OROPHARYNX: Moist, No exudate/ erythema/lesions.  Heart: Regular rate and rhythm, without murmurs, rubs, gallops, PMI non-displaced, no heaves or thrills on palpation.  Lungs: Clear to auscultation, no wheezing or rhonchi noted. No increased vocal fremitus resonant to percussion  Abdomen: Soft, nontender, nondistended, positive bowel sounds, no masses no hepatosplenomegaly noted..  Neuro: No focal neurological deficits noted cranial  nerves II through XII grossly intact. DTRs 2+ bilaterally upper and lower extremities. Strength 5 out of 5 in bilateral upper and lower extremities.  Musculoskeletal: No warm swelling or erythema around joints, no spinal tenderness noted.  Psychiatric: Patient alert and oriented x3, good insight and cognition, good recent to remote recall.  Lymph node survey: No cervical axillary or inguinal lymphadenopathy noted.       Basename 10/02/11 0640 10/01/11 2204  NA 136 137  K 4.8 4.3  CL 108 106  CO2 20 22  GLUCOSE 110* 100*  BUN 51* 57*  CREATININE 3.26* 3.50*  CALCIUM 8.3* 8.8  MG -- --  PHOS  -- --    Basename 10/02/11 0640 10/01/11 2204  AST 57* 69*  ALT 34 38  ALKPHOS 76 89  BILITOT 1.0 1.4*  PROT 5.4* 5.8*  ALBUMIN 2.6* 2.8*    Basename 10/02/11 0640  LIPASE 33  AMYLASE --    Basename 10/02/11 1408 10/02/11 0640  WBC 7.4 8.1  NEUTROABS -- --  HGB 9.0* 9.4*  HCT 26.9* 28.6*  MCV 92.1 91.7  PLT 181 204    Signed: MATTHEWS,MICHELLE A. 10/03/2011, 12:45 PM

## 2011-10-03 NOTE — Progress Notes (Signed)
   [facility] Progress Note  Subjective:  No further rectal bleeding, no abdominal pain  Objective: Vital signs in last 24 hours: Temp:  [97.7 F (36.5 C)-97.8 F (36.6 C)] 97.8 F (36.6 C) (11/14 0547) Pulse Rate:  [69-73] 71  (11/14 0547) Resp:  [18-20] 18  (11/14 0547) BP: (100-126)/(60-76) 126/76 mmHg (11/14 0547) SpO2:  [98 %-100 %] 100 % (11/14 0547) Weight:  [67.631 kg (149 lb 1.6 oz)] 149 lb 1.6 oz (67.631 kg) (11/14 0547) Last BM Date: 10/02/11  Not examined.   Studies/Results: Ct Abdomen Pelvis Wo Contrast  10/02/2011  *RADIOLOGY REPORT*  Clinical Data: Diffuse abdominal pain.  Rectal bleeding. Polysubstance abuse.  Renal failure.  CT ABDOMEN AND PELVIS WITHOUT CONTRAST  Technique:  Multidetector CT imaging of the abdomen and pelvis was performed following the standard protocol without intravenous contrast.  Comparison: None  Findings: Mild ascites is seen as well as diffuse mesenteric body wall edema.  High attenuation material is noted within the gallbladder and diffuse gallbladder wall thickening is seen which is nonspecific.  While this may be related to the body wall mesenteric edema, cholecystitis cannot definitely be excluded. Periportal edema is seen within the liver, however the liver is otherwise unremarkable appearance on this noncontrast study.  The other abdominal parenchymal organs are unremarkable on this noncontrast exam.  No evidence of splenomegaly.  No soft tissue masses are identified.  No evidence of dilated bowel loops.  Lung windows show airspace disease in the right lower lobe, suspicious for pneumonia.  There are no recent chest radiographs available for comparison.  IMPRESSION:  1.  Mild ascites and diffuse body wall and mesenteric edema, which is nonspecific. 2.  Nonspecific diffuse gallbladder wall thickening, which may be related to the diffuse mesenteric body wall edema, with cholecystitis considered less likely.  Recommend correlation with  physical exam findings; nuclear medicine hepatobiliary scan could performed for further evaluation if clinically warranted. 3.  Right lower lobe airspace disease, suspicious for pneumonia. Correlation with chest radiograph is recommended.  Original Report Authenticated By: Danae Orleans, M.D.    Assessment / Plan:  Self-limited rectal bleeding, lower abdominal pain. Bleeding and pain have resolved. Suspect bleeding perianal in nature (?hemorrhoidal) but patient needs outpatient screening colonoscopy which I have arranged through out office. He has a procedure previsit on Nov. 30th at 1:30pm and colonoscopy will be done on Dec. 7th.     LOS: 2 days   Willette Cluster  10/03/2011, 9:47 AM

## 2011-10-03 NOTE — Progress Notes (Signed)
Clinical social work completed patient psychosocial assessment, please see shadow chart. Pt denied current use of cocaine, and states he must have tested positive due to walking into a smoke house. Pt stated that he no longer uses cocaine as of two years ago, when patient had heart surgery. Patient states he actively goes to Narcotics Anonymous. Pt denies any recreational drug use, except cigarettes. Patient states he has no desire for smoking cessation, and does not have a substance abuse issue at this time. Pt thanked CSW for concern and support, but does not have any CSW needs. Signing off.   Catha Gosselin, LCSWA  .10/03/2011 12:11pm

## 2011-10-04 ENCOUNTER — Encounter (HOSPITAL_COMMUNITY): Payer: Self-pay | Admitting: Emergency Medicine

## 2011-10-21 ENCOUNTER — Emergency Department (HOSPITAL_COMMUNITY): Payer: Medicaid Other

## 2011-10-21 ENCOUNTER — Emergency Department (HOSPITAL_COMMUNITY)
Admission: EM | Admit: 2011-10-21 | Discharge: 2011-10-21 | Disposition: A | Discharge status: Home / Self Care | Payer: Medicaid Other | Attending: Emergency Medicine | Admitting: Emergency Medicine

## 2011-10-21 ENCOUNTER — Encounter (HOSPITAL_COMMUNITY): Payer: Self-pay

## 2011-10-21 DIAGNOSIS — Z954 Presence of other heart-valve replacement: Secondary | ICD-10-CM | POA: Insufficient documentation

## 2011-10-21 DIAGNOSIS — R079 Chest pain, unspecified: Secondary | ICD-10-CM | POA: Insufficient documentation

## 2011-10-21 DIAGNOSIS — E785 Hyperlipidemia, unspecified: Secondary | ICD-10-CM | POA: Insufficient documentation

## 2011-10-21 DIAGNOSIS — F17200 Nicotine dependence, unspecified, uncomplicated: Secondary | ICD-10-CM | POA: Insufficient documentation

## 2011-10-21 DIAGNOSIS — R112 Nausea with vomiting, unspecified: Secondary | ICD-10-CM | POA: Insufficient documentation

## 2011-10-21 DIAGNOSIS — R109 Unspecified abdominal pain: Secondary | ICD-10-CM | POA: Insufficient documentation

## 2011-10-21 DIAGNOSIS — R10816 Epigastric abdominal tenderness: Secondary | ICD-10-CM | POA: Insufficient documentation

## 2011-10-21 DIAGNOSIS — I10 Essential (primary) hypertension: Secondary | ICD-10-CM | POA: Insufficient documentation

## 2011-10-21 LAB — CBC
HCT: 32.9 % — ABNORMAL LOW (ref 39.0–52.0)
Hemoglobin: 10.8 g/dL — ABNORMAL LOW (ref 13.0–17.0)
MCH: 30.5 pg (ref 26.0–34.0)
MCHC: 32.8 g/dL (ref 30.0–36.0)
MCV: 92.9 fL (ref 78.0–100.0)
Platelets: 155 10*3/uL (ref 150–400)
RBC: 3.54 MIL/uL — ABNORMAL LOW (ref 4.22–5.81)
RDW: 20.1 % — ABNORMAL HIGH (ref 11.5–15.5)
WBC: 9.6 10*3/uL (ref 4.0–10.5)

## 2011-10-21 LAB — BASIC METABOLIC PANEL
BUN: 36 mg/dL — ABNORMAL HIGH (ref 6–23)
CO2: 26 mEq/L (ref 19–32)
Calcium: 9.1 mg/dL (ref 8.4–10.5)
Chloride: 109 mEq/L (ref 96–112)
Creatinine, Ser: 2.93 mg/dL — ABNORMAL HIGH (ref 0.50–1.35)
GFR calc Af Amer: 27 mL/min — ABNORMAL LOW (ref >90)
GFR calc non Af Amer: 23 mL/min — ABNORMAL LOW (ref >90)
Glucose, Bld: 106 mg/dL — ABNORMAL HIGH (ref 70–99)
Potassium: 4.5 mEq/L (ref 3.5–5.1)
Sodium: 141 mEq/L (ref 135–145)

## 2011-10-21 LAB — POCT I-STAT TROPONIN I: Troponin i, poc: 0.02 ng/mL (ref 0.00–0.08)

## 2011-10-21 MED ORDER — ONDANSETRON HCL 4 MG PO TABS: 4.0000 mg | ORAL_TABLET | Freq: Four times a day (QID) | ORAL | Status: AC

## 2011-10-21 MED ORDER — MORPHINE SULFATE 2 MG/ML IJ SOLN
INTRAMUSCULAR | Status: AC
  Administered 2011-10-21: 6 mg via INTRAVENOUS
  Filled 2011-10-21: qty 3

## 2011-10-21 MED ORDER — ONDANSETRON 8 MG/NS 50 ML IVPB
8.0000 mg | Freq: Once | INTRAVENOUS | Status: AC
  Administered 2011-10-21: 8 mg via INTRAVENOUS
  Filled 2011-10-21: qty 8

## 2011-10-21 MED ORDER — TRAMADOL HCL 50 MG PO TABS: 50.0000 mg | ORAL_TABLET | Freq: Four times a day (QID) | ORAL | Status: AC | PRN

## 2011-10-21 MED ORDER — MORPHINE SULFATE 4 MG/ML IJ SOLN: 6.0000 mg | Freq: Once | INTRAMUSCULAR | Status: DC

## 2011-10-21 MED ORDER — SODIUM CHLORIDE 0.9 % IV BOLUS (SEPSIS)
1000.0000 mL | Freq: Once | INTRAVENOUS | Status: AC
  Administered 2011-10-21: 1000 mL via INTRAVENOUS

## 2011-10-21 NOTE — ED Notes (Signed)
Pt in with nausea vomiting for 3 days and acute onset of chest pain today states pain radiates down left arm with sob on exertion

## 2011-10-21 NOTE — ED Provider Notes (Signed)
History    50yM with chest pain. Has been having n/v for past 3 days. In past day has been having pain in epigastric and lower substernal region. Burns and "feels like there is something in there." No radiation. Do not agree with triage note. Has been feeling like gets tired easier past couple days and mild dyspnea. Denies hx of blood clot. No unusual leg pain or swelling. No fever or chills. No diarrhea. No sick contacts.  CSN: [id number] Arrival date & time: 10/21/2011 10:23 AM   First MD Initiated Contact with Patient 10/21/11 1109      Chief Complaint  Patient presents with  . Chest Pain    (Consider location/radiation/quality/duration/timing/severity/associated sxs/prior treatment) HPI  Past Medical History  Diagnosis Date  . Hypertension   . Bipolar disorder   . Schizophrenia     h/o hospitalized inpt therapy @ the [facility]  . MVP (mitral valve prolapse)     with severe MR  . Hyperlipidemia   . Bipolar disorder     Attention deficit disorder  . Chronic kidney disease     Past Surgical History  Procedure Date  . Mitral valve repair 2011  . Av fistula placement, radiocephalic 05/24/2011    Left arm    Family History  Problem Relation Age of Onset  . Hypertension Mother     History  Substance Use Topics  . Smoking status: Current Everyday Smoker -- 1.0 packs/day    Types: Cigarettes  . Smokeless tobacco: Not on file  . Alcohol Use: Yes      Review of Systems   Review of symptoms negative unless otherwise noted in HPI.   Allergies  Food  Home Medications   Current Outpatient Rx  Name Route Sig Dispense Refill  . AMLODIPINE BESYLATE 5 MG PO TABS Oral Take 1 tablet (5 mg total) by mouth daily. 30 tablet 0  . ARIPIPRAZOLE 5 MG PO TABS Oral Take 5 mg by mouth daily.      . ASPIRIN 81 MG PO TBEC Oral Take 81 mg by mouth daily.      . OMEGA-3 FATTY ACIDS 1000 MG PO CAPS Oral Take 2 capsules (2 g total) by mouth daily. 30 capsule 0  .  FUROSEMIDE 80 MG PO TABS Oral Take 1 tablet (80 mg total) by mouth 2 (two) times daily. 60 tablet 6  . HYDRALAZINE HCL 25 MG PO TABS Oral Take 12.5 mg by mouth 3 (three) times daily. Half tablet three times daily     . HYDROCHLOROTHIAZIDE 25 MG PO TABS Oral Take 1 tablet (25 mg total) by mouth daily. 30 tablet 0  . LABETALOL HCL 200 MG PO TABS Oral Take 200 mg by mouth daily.      Marland Kitchen METOPROLOL TARTRATE 50 MG PO TABS Oral Take 1 tablet (50 mg total) by mouth 2 (two) times daily. 60 tablet 0  . ONE-DAILY MULTI VITAMINS PO TABS Oral Take 1 tablet by mouth daily. 30 tablet 0  . RISPERIDONE 3 MG PO TABS Oral Take 3 mg by mouth at bedtime.      . TRAZODONE HCL 50 MG PO TABS Oral Take 50 mg by mouth at bedtime. For sleep     . VITAMIN C 500 MG PO TABS Oral Take 1 tablet (500 mg total) by mouth daily. 30 tablet 0    BP 142/91  Pulse 76  Temp 98.7 F (37.1 C)  Resp 18  SpO2 99%  Physical Exam  Nursing note  and vitals reviewed. Constitutional: He appears well-developed and well-nourished. No distress.  HENT:  Head: Normocephalic and atraumatic.  Eyes: Conjunctivae are normal. Right eye exhibits no discharge. Left eye exhibits no discharge.  Neck: Neck supple.  Cardiovascular: Normal rate, regular rhythm and normal heart sounds.  Exam reveals no gallop and no friction rub.   No murmur heard. Pulmonary/Chest: Effort normal and breath sounds normal. No respiratory distress.  Abdominal: Soft. He exhibits no distension. There is tenderness. There is no rebound and no guarding.       Very mild epigastric tenderness without guarding/rebound  Musculoskeletal: He exhibits no edema and no tenderness.  Neurological: He is alert.  Skin: Skin is warm and dry.  Psychiatric: He has a normal mood and affect. His behavior is normal. Thought content normal.    ED Course  Procedures (including critical care time)  Labs Reviewed  CBC - Abnormal; Notable for the following:    RBC 3.54 (*)    Hemoglobin  10.8 (*)    HCT 32.9 (*)    RDW 20.1 (*)    All other components within normal limits  BASIC METABOLIC PANEL - Abnormal; Notable for the following:    Glucose, Bld 106 (*)    BUN 36 (*)    Creatinine, Ser 2.93 (*)    GFR calc non Af Amer 23 (*)    GFR calc Af Amer 27 (*)    All other components within normal limits  POCT I-STAT TROPONIN I  POCT CARDIAC MARKERS   No results found.  EKG:  Rhythm: normal sinus Rate: 75 Axis: normal Intervals: normal ST segments: NS ST changes   1. Nausea and vomiting   2. Abdominal pain   3. Chest pain       MDM  50yM with n/v and abdominal/CP. Suspect related to multiple episodes of vomiting and muscle strain. EKG nondiagnostic. Trop wnl. Doubt esophageal rupture with neg CXR and how well pt looks. Consider ACS buy atypical. Clinically doubt PE. Possibly viral gastritis. Consider pancreatitis but exam with only mild tenderness and tolerated eating apple in ED. Cr near baseline. Plan symptomatic tx and outp fu.        Raeford Razor, MD 10/24/11 [phone]

## 2011-10-26 ENCOUNTER — Other Ambulatory Visit: Payer: Self-pay | Admitting: Internal Medicine

## 2011-10-30 ENCOUNTER — Encounter (HOSPITAL_COMMUNITY): Payer: Self-pay | Admitting: *Deleted

## 2011-10-30 ENCOUNTER — Other Ambulatory Visit: Payer: Self-pay

## 2011-10-30 ENCOUNTER — Inpatient Hospital Stay (HOSPITAL_COMMUNITY)
Admission: EM | Admit: 2011-10-30 | Discharge: 2011-11-01 | DRG: 392 | Disposition: A | Discharge status: Home / Self Care | Payer: Medicaid Other | Attending: Internal Medicine | Admitting: Internal Medicine

## 2011-10-30 ENCOUNTER — Emergency Department (HOSPITAL_COMMUNITY): Payer: Medicaid Other

## 2011-10-30 DIAGNOSIS — I10 Essential (primary) hypertension: Secondary | ICD-10-CM | POA: Diagnosis present

## 2011-10-30 DIAGNOSIS — R1013 Epigastric pain: Secondary | ICD-10-CM | POA: Diagnosis present

## 2011-10-30 DIAGNOSIS — N289 Disorder of kidney and ureter, unspecified: Secondary | ICD-10-CM | POA: Diagnosis present

## 2011-10-30 DIAGNOSIS — K219 Gastro-esophageal reflux disease without esophagitis: Secondary | ICD-10-CM | POA: Diagnosis present

## 2011-10-30 DIAGNOSIS — R079 Chest pain, unspecified: Secondary | ICD-10-CM | POA: Diagnosis present

## 2011-10-30 DIAGNOSIS — R0789 Other chest pain: Secondary | ICD-10-CM | POA: Diagnosis present

## 2011-10-30 DIAGNOSIS — R109 Unspecified abdominal pain: Secondary | ICD-10-CM | POA: Diagnosis present

## 2011-10-30 DIAGNOSIS — F319 Bipolar disorder, unspecified: Secondary | ICD-10-CM | POA: Diagnosis present

## 2011-10-30 DIAGNOSIS — F1910 Other psychoactive substance abuse, uncomplicated: Secondary | ICD-10-CM | POA: Diagnosis present

## 2011-10-30 DIAGNOSIS — R9431 Abnormal electrocardiogram [ECG] [EKG]: Secondary | ICD-10-CM

## 2011-10-30 DIAGNOSIS — R197 Diarrhea, unspecified: Secondary | ICD-10-CM | POA: Diagnosis present

## 2011-10-30 DIAGNOSIS — R112 Nausea with vomiting, unspecified: Secondary | ICD-10-CM | POA: Diagnosis present

## 2011-10-30 DIAGNOSIS — I340 Nonrheumatic mitral (valve) insufficiency: Secondary | ICD-10-CM | POA: Diagnosis present

## 2011-10-30 DIAGNOSIS — R011 Cardiac murmur, unspecified: Secondary | ICD-10-CM | POA: Insufficient documentation

## 2011-10-30 DIAGNOSIS — I5033 Acute on chronic diastolic (congestive) heart failure: Secondary | ICD-10-CM

## 2011-10-30 DIAGNOSIS — I059 Rheumatic mitral valve disease, unspecified: Secondary | ICD-10-CM | POA: Diagnosis present

## 2011-10-30 HISTORY — DX: Heart failure, unspecified: I50.9

## 2011-10-30 HISTORY — DX: Anemia, unspecified: D64.9

## 2011-10-30 HISTORY — DX: Personal history of self-harm: Z91.5

## 2011-10-30 LAB — COMPREHENSIVE METABOLIC PANEL
Albumin: 3 g/dL — ABNORMAL LOW (ref 3.5–5.2)
BUN: 34 mg/dL — ABNORMAL HIGH (ref 6–23)
CO2: 26 mEq/L (ref 19–32)
Calcium: 8.9 mg/dL (ref 8.4–10.5)
Chloride: 104 mEq/L (ref 96–112)
Creatinine, Ser: 3.03 mg/dL — ABNORMAL HIGH (ref 0.50–1.35)
GFR calc non Af Amer: 22 mL/min — ABNORMAL LOW (ref >90)
Total Bilirubin: 1.6 mg/dL — ABNORMAL HIGH (ref 0.3–1.2)

## 2011-10-30 LAB — CARDIAC PANEL(CRET KIN+CKTOT+MB+TROPI)
Relative Index: 1.4 (ref 0.0–2.5)
Total CK: 224 U/L (ref 7–232)
Troponin I: <0.3 ng/mL (ref <0.30)

## 2011-10-30 LAB — PROTIME-INR
INR: 1.14 (ref 0.00–1.49)
Prothrombin Time: 14.8 seconds (ref 11.6–15.2)

## 2011-10-30 LAB — CBC
MCV: 90.4 fL (ref 78.0–100.0)
Platelets: 137 10*3/uL — ABNORMAL LOW (ref 150–400)
RBC: 3.56 MIL/uL — ABNORMAL LOW (ref 4.22–5.81)
RDW: 18.4 % — ABNORMAL HIGH (ref 11.5–15.5)
WBC: 6.5 10*3/uL (ref 4.0–10.5)

## 2011-10-30 LAB — APTT: aPTT: 29 seconds (ref 24–37)

## 2011-10-30 MED ORDER — SODIUM CHLORIDE 0.9 % IJ SOLN
3.0000 mL | Freq: Two times a day (BID) | INTRAMUSCULAR | Status: DC
  Administered 2011-10-31 – 2011-11-01 (×3): 3 mL via INTRAVENOUS

## 2011-10-30 MED ORDER — SODIUM CHLORIDE 0.9 % IJ SOLN: 3.0000 mL | INTRAMUSCULAR | Status: DC | PRN

## 2011-10-30 MED ORDER — ASPIRIN 81 MG PO CHEW
324.0000 mg | CHEWABLE_TABLET | Freq: Once | ORAL | Status: AC
  Administered 2011-10-30: 324 mg via ORAL
  Filled 2011-10-30: qty 4

## 2011-10-30 MED ORDER — FAMOTIDINE IN NACL 20-0.9 MG/50ML-% IV SOLN
20.0000 mg | Freq: Once | INTRAVENOUS | Status: AC
  Administered 2011-10-30: 20 mg via INTRAVENOUS
  Filled 2011-10-30 (×2): qty 50

## 2011-10-30 MED ORDER — NITROGLYCERIN 0.4 MG SL SUBL
0.4000 mg | SUBLINGUAL_TABLET | SUBLINGUAL | Status: DC | PRN
  Administered 2011-10-30: 0.4 mg via SUBLINGUAL
  Filled 2011-10-30: qty 75

## 2011-10-30 MED ORDER — ONDANSETRON HCL 4 MG/2ML IJ SOLN
4.0000 mg | Freq: Once | INTRAMUSCULAR | Status: AC
  Administered 2011-10-30: 4 mg via INTRAVENOUS
  Filled 2011-10-30: qty 2

## 2011-10-30 MED ORDER — HYDROMORPHONE HCL PF 1 MG/ML IJ SOLN
1.0000 mg | Freq: Once | INTRAMUSCULAR | Status: AC
  Administered 2011-10-30: 1 mg via INTRAVENOUS
  Filled 2011-10-30: qty 1

## 2011-10-30 MED ORDER — SODIUM CHLORIDE 0.9 % IV SOLN
250.0000 mL | INTRAVENOUS | Status: DC | PRN
  Administered 2011-10-30: 250 mL via INTRAVENOUS

## 2011-10-30 NOTE — ED Notes (Signed)
Pt. Attempted to provide urine sample, but was unsuccessful.  Will continue to monitor.

## 2011-10-30 NOTE — ED Provider Notes (Addendum)
History     CSN: [id number] Arrival date & time: 10/30/2011  9:01 PM   First MD Initiated Contact with Patient 10/30/11 2154      Chief Complaint  Patient presents with  . Chest Pain  . Nausea  . Emesis    (Consider location/radiation/quality/duration/timing/severity/associated sxs/prior treatment) HPI Comments: The patient is a 50 year old male who presents for evaluation of chest pain with nausea and vomiting that he says has been recurrent since his visit to the emergency department on December 2, approximately 9 days ago. He describes the chest discomfort as "tightness" and rates it at a 9/10 in intensity at its worst, currently at a 0. He reports that the pain radiates to his left upper extremity which she says has numbness and tingling in it. He reports that the chest pain typically will call Monday with an episode of nausea and vomiting. He reports that since being seen on the second these episodes have been increasingly frequent with multiple episodes today lasting up to 30 minutes. He has a medical history inclusive of hyperlipidemia and hypertension as well as smoking. On evaluation of his EKG, new T wave inversions and ST depression are seen in the anterolateral leads concerning for myocardial ischemia.  Patient is a 50 y.o. male presenting with chest pain and vomiting. The history is provided by the patient, medical records and a relative.  Chest Pain The chest pain began yesterday. Duration of episode(s) is 30 minutes. Chest pain occurs intermittently. The chest pain is improving. Associated with: Nausea and vomiting. At its most intense, the pain is at 9/10. The pain is currently at 0/10. The quality of the pain is described as aching and tightness. The pain radiates to the left arm. Chest pain is worsened by exertion (Vomiting). Primary symptoms include fatigue, nausea and vomiting. Pertinent negatives for primary symptoms include no fever, no syncope, no shortness of breath, no  cough, no wheezing, no palpitations, no abdominal pain, no dizziness and no altered mental status.  Onset: The patient was seen just over a week ago in the emergency department for similar symptoms of nausea and vomiting that have been recurrent since his prior visit. Vomiting occurs 2 to 5 times per day. The emesis contains stomach contents.  Pertinent negatives for associated symptoms include no claudication, no diaphoresis, no lower extremity edema, no near-syncope, no numbness, no orthopnea and no paroxysmal nocturnal dyspnea. He tried nothing for the symptoms. Risk factors include male gender and smoking/tobacco exposure.  His past medical history is significant for hyperlipidemia and hypertension.    Emesis  Associated symptoms include diarrhea. Pertinent negatives include no abdominal pain, no chills, no cough, no fever and no headaches.    Past Medical History  Diagnosis Date  . Hypertension   . Bipolar disorder   . Schizophrenia     h/o hospitalized inpt therapy @ the [facility]  . MVP (mitral valve prolapse)     with severe MR  . Hyperlipidemia   . Bipolar disorder     Attention deficit disorder  . Chronic kidney disease     Past Surgical History  Procedure Date  . Mitral valve repair 2011  . Av fistula placement, radiocephalic 05/24/2011    Left arm    Family History  Problem Relation Age of Onset  . Hypertension Mother     History  Substance Use Topics  . Smoking status: Current Everyday Smoker -- 1.0 packs/day    Types: Cigarettes  . Smokeless tobacco: Not on  file  . Alcohol Use: No      Review of Systems  Constitutional: Positive for activity change and fatigue. Negative for fever, chills, diaphoresis, appetite change and unexpected weight change.  HENT: Negative for ear pain, congestion, sore throat, rhinorrhea, neck pain, neck stiffness and postnasal drip.   Eyes: Negative.   Respiratory: Positive for chest tightness. Negative for cough,  shortness of breath and wheezing.   Cardiovascular: Positive for chest pain. Negative for palpitations, orthopnea, claudication, leg swelling, syncope and near-syncope.  Gastrointestinal: Positive for nausea, vomiting and diarrhea. Negative for abdominal pain and blood in stool.  Genitourinary: Negative for dysuria.  Musculoskeletal: Negative for back pain.  Skin: Negative for color change, pallor, rash and wound.  Neurological: Negative for dizziness, syncope, light-headedness, numbness and headaches.  Hematological: Does not bruise/bleed easily.  Psychiatric/Behavioral: Negative for hallucinations, behavioral problems, confusion, agitation and altered mental status.    Allergies  Food  Home Medications   Current Outpatient Rx  Name Route Sig Dispense Refill  . AMLODIPINE BESYLATE 5 MG PO TABS Oral Take 1 tablet (5 mg total) by mouth daily. 30 tablet 0  . ARIPIPRAZOLE 5 MG PO TABS Oral Take 5 mg by mouth daily.      . ASPIRIN 81 MG PO TBEC Oral Take 81 mg by mouth daily.      . OMEGA-3 FATTY ACIDS 1000 MG PO CAPS Oral Take 2 capsules (2 g total) by mouth daily. 30 capsule 0  . FUROSEMIDE 80 MG PO TABS Oral Take 1 tablet (80 mg total) by mouth 2 (two) times daily. 60 tablet 6  . HYDRALAZINE HCL 25 MG PO TABS Oral Take 12.5 mg by mouth 3 (three) times daily. Half tablet three times daily     . HYDROCHLOROTHIAZIDE 25 MG PO TABS Oral Take 1 tablet (25 mg total) by mouth daily. 30 tablet 0  . LABETALOL HCL 200 MG PO TABS Oral Take 200 mg by mouth daily.      Marland Kitchen METOPROLOL TARTRATE 50 MG PO TABS Oral Take 1 tablet (50 mg total) by mouth 2 (two) times daily. 60 tablet 0  . ONE-DAILY MULTI VITAMINS PO TABS Oral Take 1 tablet by mouth daily. 30 tablet 0  . RISPERIDONE 3 MG PO TABS Oral Take 3 mg by mouth at bedtime.      . TRAMADOL HCL 50 MG PO TABS Oral Take 1 tablet (50 mg total) by mouth every 6 (six) hours as needed for pain. Maximum dose= 8 tablets per day 8 tablet 0  . TRAZODONE HCL 50 MG  PO TABS Oral Take 50 mg by mouth at bedtime. For sleep     . VITAMIN C 500 MG PO TABS Oral Take 1 tablet (500 mg total) by mouth daily. 30 tablet 0    BP 171/110  Pulse 91  Temp(Src) 98.4 F (36.9 C) (Oral)  Resp 20  Ht 5\' 6"  (1.676 m)  Wt 147 lb (66.679 kg)  BMI 23.73 kg/m2  SpO2 100%  Physical Exam  Nursing note and vitals reviewed. Constitutional: He is oriented to person, place, and time. He appears well-developed and well-nourished. He appears distressed.  HENT:  Head: Normocephalic and atraumatic.  Mouth/Throat: Oropharynx is clear and moist.  Eyes: EOM are normal. Pupils are equal, round, and reactive to light.  Neck: Normal range of motion. Neck supple. No JVD present. No tracheal deviation present.  Cardiovascular: Normal rate, regular rhythm, S1 normal, S2 normal, normal heart sounds and intact distal pulses.  No extrasystoles are present. PMI is not displaced.  Exam reveals no gallop and no friction rub.   No murmur heard. Pulmonary/Chest: Effort normal and breath sounds normal. No accessory muscle usage or stridor. Not tachypneic. No respiratory distress. He has no decreased breath sounds. He has no wheezes. He has no rhonchi. He has no rales. He exhibits no tenderness, no bony tenderness, no crepitus and no retraction.  Abdominal: Soft. Bowel sounds are normal. He exhibits no distension and no mass. There is no tenderness. There is no rebound and no guarding.  Musculoskeletal: Normal range of motion. He exhibits no edema and no tenderness.  Neurological: He is alert and oriented to person, place, and time. No cranial nerve deficit. He exhibits normal muscle tone.  Skin: Skin is warm and dry. No rash noted. He is not diaphoretic. No erythema. No pallor.  Psychiatric: He has a normal mood and affect. His behavior is normal.    ED Course  Procedures (including critical care time)   Date: 10/30/2011  Rate: 94  Rhythm: normal sinus rhythm  QRS Axis: normal  Intervals:  normal  ST/T Wave abnormalities: ST depressions anteriorly and T wave inversions anterolaterally that are new since previous tracing  Conduction Disutrbances:none  Narrative Interpretation: EKG with subtle changes concerning for possible myocardial ischemia  Old EKG Reviewed: changes noted    Labs Reviewed  CBC - Abnormal; Notable for the following:    RBC 3.56 (*)    Hemoglobin 10.7 (*)    HCT 32.2 (*)    RDW 18.4 (*)    Platelets 137 (*)    All other components within normal limits  COMPREHENSIVE METABOLIC PANEL - Abnormal; Notable for the following:    Glucose, Bld 103 (*)    BUN 34 (*)    Creatinine, Ser 3.03 (*)    Albumin 3.0 (*)    AST 57 (*)    Total Bilirubin 1.6 (*)    GFR calc non Af Amer 22 (*)    GFR calc Af Amer 26 (*)    All other components within normal limits  CARDIAC PANEL(CRET KIN+CKTOT+MB+TROPI)  PROTIME-INR  APTT  LIPASE, BLOOD  URINE RAPID DRUG SCREEN (HOSP PERFORMED)   Dg Chest 2 View  10/30/2011  *RADIOLOGY REPORT*  Clinical Data: Pain with some shortness of breath.  CHEST - 2 VIEW  Comparison: Of 02/12  Findings: The cardiopericardial silhouette is enlarged.  The patient is status post mitral valve replacement.  No edema or focal airspace consolidation. Imaged bony structures of the thorax are intact. Telemetry leads overlie the chest.  IMPRESSION: Stable.  Cardiomegaly without acute findings.  Original Report Authenticated By: ERIC A. MANSELL, M.D.     No diagnosis found.  12:19 AM I've discussed the case with Dr. Houston Siren of the hospitalist service and with Dr. Mayford Knife of the cardiology service, and the hospitalist service will be admitting the patient with consultation as needed from cardiology.  MDM  Given the complaint of chest pain radiating to the left upper extremity with left upper extremity numbness and nausea vomiting in the face of the EKG changes, I have to strongly consider acute coronary syndrome or myocardial ischemia or infarction  in the differential diagnosis. Other possibilities would include pancreatitis, gastritis, gastroenteritis, viral gastroenteritis. Additionally with his history of cocaine abuse, cocaine induced chest pain is also a consideration. This is an incomplete list of differential diagnoses for the patient and is not exhaustive. Given my concern and risk factors for cardiac chest  pain, and given his previous evaluation in the ED and treatment for gastro-and her source of discomfort without relief, I feel that I need to seek admission for the patient for further evaluation of his symptoms.         Felisa Bonier, MD 10/31/11 [phone]

## 2011-10-30 NOTE — ED Notes (Signed)
Pt states he started to have chest pain and n/v last week. Pt with the [facility] and they stated it was viral. Pt states pain and nausea has gotten worst

## 2011-10-31 ENCOUNTER — Emergency Department (HOSPITAL_COMMUNITY): Payer: Medicaid Other

## 2011-10-31 ENCOUNTER — Encounter (HOSPITAL_COMMUNITY): Payer: Self-pay | Admitting: Internal Medicine

## 2011-10-31 DIAGNOSIS — K219 Gastro-esophageal reflux disease without esophagitis: Secondary | ICD-10-CM | POA: Diagnosis present

## 2011-10-31 DIAGNOSIS — R079 Chest pain, unspecified: Secondary | ICD-10-CM

## 2011-10-31 LAB — RAPID URINE DRUG SCREEN, HOSP PERFORMED
Barbiturates: POSITIVE — AB
Tetrahydrocannabinol: POSITIVE — AB

## 2011-10-31 LAB — CARDIAC PANEL(CRET KIN+CKTOT+MB+TROPI)
CK, MB: 3.5 ng/mL (ref 0.3–4.0)
Relative Index: 1.8 (ref 0.0–2.5)
Total CK: 191 U/L (ref 7–232)

## 2011-10-31 MED ORDER — TRAZODONE HCL 50 MG PO TABS
50.0000 mg | ORAL_TABLET | Freq: Every day | ORAL | Status: DC
  Administered 2011-10-31: 50 mg via ORAL
  Filled 2011-10-31 (×3): qty 1

## 2011-10-31 MED ORDER — AMLODIPINE BESYLATE 5 MG PO TABS
5.0000 mg | ORAL_TABLET | Freq: Every day | ORAL | Status: DC
Start: 2011-10-31 — End: 2011-10-31
  Administered 2011-10-31: 5 mg via ORAL
  Filled 2011-10-31: qty 1

## 2011-10-31 MED ORDER — HYDROMORPHONE HCL PF 1 MG/ML IJ SOLN
1.0000 mg | INTRAMUSCULAR | Status: DC | PRN
  Administered 2011-10-31 – 2011-11-01 (×4): 1 mg via INTRAVENOUS
  Filled 2011-10-31 (×4): qty 1

## 2011-10-31 MED ORDER — FUROSEMIDE 80 MG PO TABS
80.0000 mg | ORAL_TABLET | Freq: Two times a day (BID) | ORAL | Status: DC
  Administered 2011-10-31 – 2011-11-01 (×4): 80 mg via ORAL
  Filled 2011-10-31 (×7): qty 1

## 2011-10-31 MED ORDER — LABETALOL HCL 200 MG PO TABS
200.0000 mg | ORAL_TABLET | Freq: Every day | ORAL | Status: DC
  Administered 2011-10-31 – 2011-11-01 (×2): 200 mg via ORAL
  Filled 2011-10-31 (×3): qty 1

## 2011-10-31 MED ORDER — VITAMIN C 500 MG PO TABS
500.0000 mg | ORAL_TABLET | Freq: Every day | ORAL | Status: DC
  Administered 2011-10-31 – 2011-11-01 (×2): 500 mg via ORAL
  Filled 2011-10-31 (×3): qty 1

## 2011-10-31 MED ORDER — LABETALOL HCL 200 MG PO TABS
200.0000 mg | ORAL_TABLET | Freq: Every day | ORAL | Status: DC
  Filled 2011-10-31: qty 1

## 2011-10-31 MED ORDER — RISPERIDONE 3 MG PO TABS
3.0000 mg | ORAL_TABLET | Freq: Every day | ORAL | Status: DC
  Administered 2011-10-31: 3 mg via ORAL
  Filled 2011-10-31 (×3): qty 1

## 2011-10-31 MED ORDER — ASPIRIN EC 81 MG PO TBEC
81.0000 mg | DELAYED_RELEASE_TABLET | Freq: Every day | ORAL | Status: DC
  Administered 2011-10-31 – 2011-11-01 (×2): 81 mg via ORAL
  Filled 2011-10-31 (×3): qty 1

## 2011-10-31 MED ORDER — HYDROCHLOROTHIAZIDE 25 MG PO TABS
25.0000 mg | ORAL_TABLET | Freq: Every day | ORAL | Status: DC
  Administered 2011-10-31 – 2011-11-01 (×2): 25 mg via ORAL
  Filled 2011-10-31 (×3): qty 1

## 2011-10-31 MED ORDER — METOPROLOL TARTRATE 25 MG PO TABS
50.0000 mg | ORAL_TABLET | Freq: Two times a day (BID) | ORAL | Status: DC
  Administered 2011-10-31: 50 mg via ORAL
  Filled 2011-10-31: qty 2

## 2011-10-31 MED ORDER — ONE-DAILY MULTI VITAMINS PO TABS: 1.0000 | ORAL_TABLET | Freq: Every day | ORAL | Status: DC

## 2011-10-31 MED ORDER — ARIPIPRAZOLE 5 MG PO TABS
5.0000 mg | ORAL_TABLET | Freq: Every day | ORAL | Status: DC
  Administered 2011-10-31: 5 mg via ORAL
  Filled 2011-10-31 (×3): qty 1

## 2011-10-31 MED ORDER — ASPIRIN EC 81 MG PO TBEC
81.0000 mg | DELAYED_RELEASE_TABLET | Freq: Every day | ORAL | Status: DC
  Filled 2011-10-31: qty 1

## 2011-10-31 MED ORDER — HYDRALAZINE HCL 25 MG PO TABS
12.5000 mg | ORAL_TABLET | Freq: Three times a day (TID) | ORAL | Status: DC
  Administered 2011-10-31 – 2011-11-01 (×4): 12.5 mg via ORAL
  Filled 2011-10-31 (×9): qty 0.5

## 2011-10-31 MED ORDER — OMEGA-3 FATTY ACIDS 1000 MG PO CAPS
2.0000 g | ORAL_CAPSULE | Freq: Every day | ORAL | Status: DC
  Administered 2011-10-31 – 2011-11-01 (×2): 2 g via ORAL
  Filled 2011-10-31 (×3): qty 2

## 2011-10-31 MED ORDER — AMLODIPINE BESYLATE 10 MG PO TABS
10.0000 mg | ORAL_TABLET | Freq: Every day | ORAL | Status: DC
  Administered 2011-11-01: 10 mg via ORAL
  Filled 2011-10-31 (×2): qty 1

## 2011-10-31 MED ORDER — ONDANSETRON HCL 4 MG/2ML IJ SOLN
4.0000 mg | Freq: Four times a day (QID) | INTRAMUSCULAR | Status: DC | PRN
  Administered 2011-10-31 – 2011-11-01 (×4): 4 mg via INTRAVENOUS
  Filled 2011-10-31 (×4): qty 2

## 2011-10-31 MED ORDER — THERA M PLUS PO TABS
1.0000 | ORAL_TABLET | Freq: Every day | ORAL | Status: DC
  Administered 2011-10-31 – 2011-11-01 (×2): 1 via ORAL
  Filled 2011-10-31 (×5): qty 1

## 2011-10-31 MED ORDER — ARIPIPRAZOLE 5 MG PO TABS
5.0000 mg | ORAL_TABLET | Freq: Every day | ORAL | Status: DC
Start: 2011-10-31 — End: 2011-10-31
  Filled 2011-10-31: qty 1

## 2011-10-31 NOTE — Plan of Care (Signed)
Problem: Consults Goal: General Medical Patient Education See Patient Education Module for specific education. Outcome: Completed/Met Date Met:  10/31/11 PT. HAD SOME ABD. PAIN THAT CAUSED SOME CHEST PAIN.

## 2011-10-31 NOTE — ED Notes (Signed)
Report given to Kim, RN.

## 2011-10-31 NOTE — ED Notes (Signed)
Patient requests to have his medicine times changed due to making him sick, pharmacy notified

## 2011-10-31 NOTE — Consult Note (Addendum)
HPI: 50 year old male with past medical history of mitral valve repair for mitral valve prolapse, end-stage renal disease for evaluation of chest pain. Patient had a cardiac catheterization prior to his mitral valve prolapse in July of 2011. This showed normal coronary arteries. He had mitral valve repair at that time. A transesophageal echocardiogram in November of 2011 showed residual moderate to severe mitral regurgitation. He has been managed medically. He does have dyspnea on exertion no orthopnea, PND, pedal edema, exertional chest pain or syncope. Over the past one week he has had problems with chills, nausea and vomiting particularly after eating, and diarrhea. He denies fevers. He also has epigastric pain that increases with vomiting. He also had pin like chest pain that occurred with vomiting. The pain did not radiate. Not pleuritic or exertional. Patient presented to the emergency room for vomiting. We were asked to evaluate for complaints of chest pain.  Medications Prior to Admission  Medication Dose Route Frequency Provider Last Rate Last Dose  . sodium chloride 0.9 % injection 3 mL  3 mL Intravenous Q12H Felisa Bonier, MD   3 mL at 10/31/11 1152   And  . sodium chloride 0.9 % injection 3 mL  3 mL Intravenous PRN Felisa Bonier, MD       And  . 0.9 %  sodium chloride infusion  250 mL Intravenous PRN Felisa Bonier, MD 100 mL/hr at 10/30/11 2210 250 mL at 10/30/11 2210  . amLODipine (NORVASC) tablet 5 mg  5 mg Oral Daily Peter Le   5 mg at 10/31/11 1047  . ARIPiprazole (ABILIFY) tablet 5 mg  5 mg Oral QHS Vijaya Akula      . aspirin chewable tablet 324 mg  324 mg Oral Once Felisa Bonier, MD   324 mg at 10/30/11 2236  . aspirin EC tablet 81 mg  81 mg Oral Q1200 Vijaya Akula   81 mg at 10/31/11 1318  . famotidine (PEPCID) IVPB 20 mg  20 mg Intravenous Once Felisa Bonier, MD   20 mg at 10/30/11 2315  . fish oil-omega-3 fatty acids capsule 2 g  2 g Oral Daily Peter Le   2 g at  10/31/11 1050  . furosemide (LASIX) tablet 80 mg  80 mg Oral BID Peter Le   80 mg at 10/31/11 0905  . hydrALAZINE (APRESOLINE) tablet 12.5 mg  12.5 mg Oral TID Peter Le   12.5 mg at 10/31/11 1318  . hydrochlorothiazide (HYDRODIURIL) tablet 25 mg  25 mg Oral Daily Peter Le   25 mg at 10/31/11 1047  . HYDROmorphone (DILAUDID) injection 1 mg  1 mg Intravenous Once Felisa Bonier, MD   1 mg at 10/30/11 2317  . HYDROmorphone (DILAUDID) injection 1 mg  1 mg Intravenous Q2H PRN Peter Le   1 mg at 10/31/11 1500  . labetalol (NORMODYNE) tablet 200 mg  200 mg Oral Q1200 Vijaya Akula   200 mg at 10/31/11 1315  . metoprolol tartrate (LOPRESSOR) tablet 50 mg  50 mg Oral BID Peter Le   50 mg at 10/31/11 1317  . multivitamins ther. w/minerals tablet 1 tablet  1 tablet Oral Daily Vijaya Akula      . nitroGLYCERIN (NITROSTAT) SL tablet 0.4 mg  0.4 mg Sublingual Q5 Min x 3 PRN Felisa Bonier, MD   0.4 mg at 10/30/11 2315  . ondansetron (ZOFRAN) injection 4 mg  4 mg Intravenous Once Felisa Bonier, MD   4 mg at 10/30/11  2315  . ondansetron (ZOFRAN) injection 4 mg  4 mg Intravenous Q6H PRN Peter Le   4 mg at 10/31/11 1459  . vitamin C (ASCORBIC ACID) tablet 500 mg  500 mg Oral Daily Peter Le   500 mg at 10/31/11 1047  . DISCONTD: ARIPiprazole (ABILIFY) tablet 5 mg  5 mg Oral Daily Houston Siren      . DISCONTD: aspirin EC tablet 81 mg  81 mg Oral Daily Houston Siren      . DISCONTD: labetalol (NORMODYNE) tablet 200 mg  200 mg Oral Daily Houston Siren      . DISCONTD: multivitamin tablet 1 tablet  1 tablet Oral Daily Houston Siren       Medications Prior to Admission  Medication Sig Dispense Refill  . amLODipine (NORVASC) 5 MG tablet Take 1 tablet (5 mg total) by mouth daily.  30 tablet  0  . ARIPiprazole (ABILIFY) 5 MG tablet Take 5 mg by mouth daily.        Marland Kitchen aspirin 81 MG EC tablet Take 81 mg by mouth daily.        . fish oil-omega-3 fatty acids 1000 MG capsule Take 2 capsules (2 g total) by mouth daily.  30 capsule  0  .  furosemide (LASIX) 80 MG tablet Take 1 tablet (80 mg total) by mouth 2 (two) times daily.  60 tablet  6  . hydrALAZINE (APRESOLINE) 25 MG tablet Take 12.5 mg by mouth 3 (three) times daily. Half tablet three times daily       . hydrochlorothiazide (HYDRODIURIL) 25 MG tablet Take 1 tablet (25 mg total) by mouth daily.  30 tablet  0  . labetalol (NORMODYNE) 200 MG tablet Take 200 mg by mouth daily.        . metoprolol (LOPRESSOR) 50 MG tablet Take 1 tablet (50 mg total) by mouth 2 (two) times daily.  60 tablet  0  . Multiple Vitamin (MULTIVITAMIN) tablet Take 1 tablet by mouth daily.  30 tablet  0  . risperiDONE (RISPERDAL) 3 MG tablet Take 3 mg by mouth at bedtime.        . traMADol (ULTRAM) 50 MG tablet Take 1 tablet (50 mg total) by mouth every 6 (six) hours as needed for pain. Maximum dose= 8 tablets per day  8 tablet  0  . traZODone (DESYREL) 50 MG tablet Take 50 mg by mouth at bedtime. For sleep       . vitamin C (ASCORBIC ACID) 500 MG tablet Take 1 tablet (500 mg total) by mouth daily.  30 tablet  0    Allergies  Allergen Reactions  . Food Swelling    Strawberries, Cashews    Past Medical History  Diagnosis Date  . Hypertension   . Bipolar disorder   . Schizophrenia     h/o hospitalized inpt therapy @ the [facility]  . MVP (mitral valve prolapse)     with severe MR  . Hyperlipidemia   . Chronic kidney disease   . H/O: suicide attempt 1998    was hospitalized at [facility]  . Anemia     Hx of iron infusions  . CHF (congestive heart failure)     Hx of Class IV CHF    Past Surgical History  Procedure Date  . Mitral valve repair 2011  . Av fistula placement, radiocephalic 05/24/2011    Left arm  . Leg surgery     History   Social History  . Marital  Status: Married    Spouse Name: N/A    Number of Children: N/A  . Years of Education: N/A   Occupational History  . disabled    Social History Main Topics  . Smoking status: Current Everyday Smoker  -- 1.0 packs/day for 30 years    Types: Cigarettes  . Smokeless tobacco: Never Used  . Alcohol Use: No     Quit alcohol in July of 2011  . Drug Use: Yes    Special: Cocaine, Marijuana     Used cocaine in last week  . Sexually Active: No   Other Topics Concern  . Not on file   Social History Narrative  . No narrative on file    Family History  Problem Relation Age of Onset  . Hypertension Mother     ROS:  Nausea and vomiting, hematocheezia and diarrhea and chills but no productive cough, hemoptysis, dysphasia, odynophagia, melena, dysuria, hematuria, rash, seizure activity, orthopnea, PND, pedal edema, claudication. Remaining systems are negative.  Physical Exam:   Blood pressure 129/98, pulse 86, temperature 97.8 F (36.6 C), temperature source Oral, resp. rate 21, height 5\' 6"  (1.676 m), weight 147 lb (66.679 kg), SpO2 100.00%.  General:  Well developed/well nourished in NAD Skin warm/dry Patient not depressed No peripheral clubbing Back-normal HEENT-normal/normal eyelids Neck supple/normal carotid upstroke bilaterally; no bruits; no JVD; no thyromegaly chest - CTA/ normal expansion; status post sternotomy CV - RRR/normal S1 and S2; no rubs or gallops;  PMI nondisplaced; 3/6 systolic murmur apex Abdomen -tender to palpation in the epigastric area but no rebound,  no HSM, no mass, + bowel sounds, no bruit 2+ femoral pulses, no bruits Ext-no edema, chords, 2+ DP Neuro-grossly nonfocal  ECG sinus rhythm, left ventricular hypertrophy, lateral T-wave inversion.  Results for orders placed during the hospital encounter of 10/30/11 (from the past 48 hour(s))  CBC     Status: Abnormal   Collection Time   10/30/11 10:00 PM      Component Value Range Comment   WBC 6.5  4.0 - 10.5 (K/uL)    RBC 3.56 (*) 4.22 - 5.81 (MIL/uL)    Hemoglobin 10.7 (*) 13.0 - 17.0 (g/dL)    HCT 16.1 (*) 09.6 - 52.0 (%)    MCV 90.4  78.0 - 100.0 (fL)    MCH 30.1  26.0 - 34.0 (pg)    MCHC 33.2   30.0 - 36.0 (g/dL)    RDW 04.5 (*) 40.9 - 15.5 (%)    Platelets 137 (*) 150 - 400 (K/uL)   CARDIAC PANEL(CRET KIN+CKTOT+MB+TROPI)     Status: Normal   Collection Time   10/30/11 10:00 PM      Component Value Range Comment   Total CK 224  7 - 232 (U/L)    CK, MB 3.1  0.3 - 4.0 (ng/mL)    Troponin I <0.30  <0.30 (ng/mL)    Relative Index 1.4  0.0 - 2.5    COMPREHENSIVE METABOLIC PANEL     Status: Abnormal   Collection Time   10/30/11 10:00 PM      Component Value Range Comment   Sodium 138  135 - 145 (mEq/L)    Potassium 3.8  3.5 - 5.1 (mEq/L)    Chloride 104  96 - 112 (mEq/L)    CO2 26  19 - 32 (mEq/L)    Glucose, Bld 103 (*) 70 - 99 (mg/dL)    BUN 34 (*) 6 - 23 (mg/dL)    Creatinine, Ser 8.11 (*)  0.50 - 1.35 (mg/dL)    Calcium 8.9  8.4 - 10.5 (mg/dL)    Total Protein 6.2  6.0 - 8.3 (g/dL)    Albumin 3.0 (*) 3.5 - 5.2 (g/dL)    AST 57 (*) 0 - 37 (U/L)    ALT 30  0 - 53 (U/L)    Alkaline Phosphatase 101  39 - 117 (U/L)    Total Bilirubin 1.6 (*) 0.3 - 1.2 (mg/dL)    GFR calc non Af Amer 22 (*) >90 (mL/min)    GFR calc Af Amer 26 (*) >90 (mL/min)   PROTIME-INR     Status: Normal   Collection Time   10/30/11 10:00 PM      Component Value Range Comment   Prothrombin Time 14.8  11.6 - 15.2 (seconds)    INR 1.14  0.00 - 1.49    APTT     Status: Normal   Collection Time   10/30/11 10:00 PM      Component Value Range Comment   aPTT 29  24 - 37 (seconds)   LIPASE, BLOOD     Status: Normal   Collection Time   10/30/11 10:00 PM      Component Value Range Comment   Lipase 34  11 - 59 (U/L)   URINE RAPID DRUG SCREEN (HOSP PERFORMED)     Status: Abnormal   Collection Time   10/30/11 11:30 PM      Component Value Range Comment   Opiates NONE DETECTED  NONE DETECTED     Cocaine POSITIVE (*) NONE DETECTED     Benzodiazepines POSITIVE (*) NONE DETECTED     Amphetamines NONE DETECTED  NONE DETECTED     Tetrahydrocannabinol POSITIVE (*) NONE DETECTED     Barbiturates POSITIVE (*)  NONE DETECTED      Dg Chest 2 View  10/30/2011  *RADIOLOGY REPORT*  Clinical Data: Pain with some shortness of breath.  CHEST - 2 VIEW  Comparison: Of 02/12  Findings: The cardiopericardial silhouette is enlarged.  The patient is status post mitral valve replacement.  No edema or focal airspace consolidation. Imaged bony structures of the thorax are intact. Telemetry leads overlie the chest.  IMPRESSION: Stable.  Cardiomegaly without acute findings.  Original Report Authenticated By: ERIC A. MANSELL, M.D.   US Abdomen Complete  10/31/2011  *RADIOLOGY REPORT*  Clinical Data:  Abdominal pain with nausea.  High blood pressure. Kidney disease.  COMPLETE ABDOMINAL ULTRASOUND  Comparison:  11 08/02/2011 CT.  09/21/2010 ultrasound.  Findings:  Gallbladder:  No gallstones.  Diffuse wall edema measuring up to 9.1 mm.  Etiology indeterminate.  The patient was not tender over this region during scanning.  It is possible this is not related to cholecystitis and may be related to increased right heart pressures, metabolic abnormality or liver disease.  Clinical correlation recommended.  Common bile duct:  2.9 mm  Liver:  No focal lesion identified.  Within normal limits in parenchymal echogenicity.  IVC:  Appears normal.  Pancreas:  No focal abnormality seen.  Spleen:  8.2 cm. Accessory splenic tissue.  Right Kidney:  11.4 cm.  Tiny 5 mm cyst.  Mild increase echogenicity may be normal versus result of medical renal disease. No hydronephrosis.  Left Kidney:  9.8 cm. No hydronephrosis or renal mass.  Abdominal aorta:  No aneurysm identified.  IMPRESSION: No gallstones.  Diffuse gallbladder wall thickening as discussed above.  Original Report Authenticated By: Fuller Canada, M.D.    Assessment/Plan Patient Active [facility]  Problem List: 1)Chest pain (10/31/2011)  patient's chest pain is not consistent with cardiac etiology. It occurs only with vomiting. Await followup enzymes. If negative no further ischemia  evaluation warranted. Catheterization in July of 2011 prior to mitral valve repair showed normal coronary arteries.  2)HYPERTENSION (06/14/2010)  continue preadmission blood pressure medications. 3)Mitral valvular regurgitation (10/02/2011)  patient with known residual mitral regurgitation following mitral valve repair. He should followup with Dr. Excell Seltzer as scheduled after discharge.   4)abdominal pain/nausea and vomiting-etiology unclear. Question viral syndrome. Question related to gallbladder as gallbladder wall noted to be thickened on ultrasound. Some tenderness to palpation. May need GI evaluation. I will leave this to the primary service. 5) renal insufficiency-followed by nephrology 6) substance abuse-patients drug screen noted to have cocaine. Patient counseled on discontinuing.  Olga Millers MD 10/31/2011, 3:31 PM  Given cocaine abuse, would dc lopressor (increased risk of vasospasm) and increase norvasc. Olga Millers

## 2011-10-31 NOTE — Progress Notes (Signed)
Noted no pcp, self pay [employer] resident. Spoke with pt who states [facility]" States he is not sure if he is still listed as an active member.  Pt had agreed to a referral at [facility] if possible. Referral to [facility] rep completed. CM discussed and provided written resources for [employer] self pay pcps and other [place] social resources. Pt states he gets medications without issues at [facility].  CM spoke with Kyla at [facility] to confirm pt has never been seen at [facility] Pt has [facility] seen at [facility].  CM spoke with Toni Amend of [facility] pharmacy to confirm if needed pt is eligible for [facility] indigent medication assistance upon discharge.

## 2011-10-31 NOTE — ED Notes (Signed)
Patient back from ultrasound, sleeping, no complaints of pain or nausea at this time, family at bedside

## 2011-10-31 NOTE — H&P (Signed)
PCP: None   Chief Complaint:  Chest pain, nausea, diarrhea.   HPI: Ryan Winters is an 50 y.o. male with history of hypertension, bipolar disorder, severe mitral regurgitation status post valvuloplasty, hypertension, schizophrenia, hyperlipidemia, presents to [facility] because of persistent epigastric and substernal chest discomfort along with profuse watery diarrhea for 3-4 days.  He was seen in the emergency room about a week ago with similar complaints, and was thought to have had a viral infection. Subsequently he developed diarrhea along with increased heartburn and presents back to the emergency room. Evaluation in emergency room included an EKG which showed new T wave inversions  compared to the previous EKG. Given his EKG changes and increased cardiac risk factors, cardiologist Dr. Mayford Knife was consulted by the emergency room physician and recommended hospitalists to admit the patient and to call for consult as needed in the morning.  Rewiew of Systems:  The patient denies anorexia, fever, weight loss,, vision loss, decreased hearing, hoarseness, syncope, dyspnea on exertion, peripheral edema, balance deficits, hemoptysis, abdominal pain, melena, hematochezia, hematuria, incontinence, genital sores, muscle weakness, suspicious skin lesions, transient blindness, difficulty walking, depression, unusual weight change, abnormal bleeding, enlarged lymph nodes, angioedema, and breast masses.   Past Medical History  Diagnosis Date  . Hypertension   . Bipolar disorder   . Schizophrenia     h/o hospitalized inpt therapy @ the [facility]  . MVP (mitral valve prolapse)     with severe MR  . Hyperlipidemia   . Bipolar disorder     Attention deficit disorder  . Chronic kidney disease     Past Surgical History  Procedure Date  . Mitral valve repair 2011  . Av fistula placement, radiocephalic 05/24/2011    Left arm    Medications:  HOME MEDS: Prior to Admission  medications   Medication Sig Start Date End Date Taking? Authorizing Provider  amLODipine (NORVASC) 5 MG tablet Take 1 tablet (5 mg total) by mouth daily. 10/03/11  Yes Michelle A. Matthews  ARIPiprazole (ABILIFY) 5 MG tablet Take 5 mg by mouth daily.     Yes Historical Provider, MD  aspirin 81 MG EC tablet Take 81 mg by mouth daily.     Yes Historical Provider, MD  fish oil-omega-3 fatty acids 1000 MG capsule Take 2 capsules (2 g total) by mouth daily. 10/03/11  Yes Michelle A. Matthews  furosemide (LASIX) 80 MG tablet Take 1 tablet (80 mg total) by mouth 2 (two) times daily. 03/08/11  Yes Micheline Figeroa, MD  hydrALAZINE (APRESOLINE) 25 MG tablet Take 12.5 mg by mouth 3 (three) times daily. Half tablet three times daily    Yes Historical Provider, MD  hydrochlorothiazide (HYDRODIURIL) 25 MG tablet Take 1 tablet (25 mg total) by mouth daily. 10/03/11 10/02/12 Yes Michelle A. Matthews  labetalol (NORMODYNE) 200 MG tablet Take 200 mg by mouth daily.     Yes Historical Provider, MD  metoprolol (LOPRESSOR) 50 MG tablet Take 1 tablet (50 mg total) by mouth 2 (two) times daily. 10/03/11  Yes Michelle A. Matthews  Multiple Vitamin (MULTIVITAMIN) tablet Take 1 tablet by mouth daily. 10/03/11  Yes Michelle A. Matthews  risperiDONE (RISPERDAL) 3 MG tablet Take 3 mg by mouth at bedtime.     Yes Historical Provider, MD  traMADol (ULTRAM) 50 MG tablet Take 1 tablet (50 mg total) by mouth every 6 (six) hours as needed for pain. Maximum dose= 8 tablets per day 10/21/11 10/31/11 Yes Raeford Razor, MD  traZODone (DESYREL) 50  MG tablet Take 50 mg by mouth at bedtime. For sleep    Yes Historical Provider, MD  vitamin C (ASCORBIC ACID) 500 MG tablet Take 1 tablet (500 mg total) by mouth daily. 10/03/11  Yes Michelle A. Ashley Royalty     Allergies:  Allergies  Allergen Reactions  . Food Swelling    Strawberries, Cashews    Social History:   reports that he has been smoking Cigarettes.  He has been smoking about 1  pack per day. He does not have any smokeless tobacco history on file. He reports that he uses illicit drugs (Cocaine and Marijuana). He reports that he does not drink alcohol. He is married.  Family History: Family History  Problem Relation Age of Onset  . Hypertension Mother      Physical Exam: Filed Vitals:   10/30/11 2118 10/30/11 2137 10/31/11 0035  BP: 171/110  149/95  Pulse: 91  98  Temp: 98.4 F (36.9 C)  98.7 F (37.1 C)  TempSrc: Oral  Oral  Resp: 20  18  Height:  5\' 6"  (1.676 m)   Weight:  66.679 kg (147 lb)   SpO2: 100%  99%   Blood pressure 149/95, pulse 98, temperature 98.7 F (37.1 C), temperature source Oral, resp. rate 18, height 5\' 6"  (1.676 m), weight 66.679 kg (147 lb), SpO2 99.00%.  GEN:  Pleasant person lying in the stretcher in no acute distress; cooperative with exam PSYCH:  alert and oriented x4; does not appear anxious does not appear depressed; affect is normal HEENT: Mucous membranes pink and anicteric; PERRLA; EOM intact; no cervical lymphadenopathy nor thyromegaly or carotid bruit; no JVD; Breasts:: Not examined CHEST WALL: No tenderness CHEST: Normal respiration, clear to auscultation bilaterally HEART: Regular rate and rhythm; no murmurs rubs or gallops BACK: No kyphosis or scoliosis; no CVA tenderness ABDOMEN: Obese, soft with slight epigastric and right upper quadrant tenderness, no masses, no organomegaly, normal abdominal bowel sounds; no pannus; no intertriginous candida. Rectal Exam: Not done EXTREMITIES: No bone or joint deformity; age-appropriate arthropathy of the hands and knees; no edema; no ulcerations. Genitalia: not examined PULSES: 2+ and symmetric SKIN: Normal hydration no rash or ulceration CNS: Cranial nerves 2-12 grossly intact no focal neurologic deficit   Labs & Imaging Results for orders placed during the hospital encounter of 10/30/11 (from the past 48 hour(s))  CBC     Status: Abnormal   Collection Time   10/30/11  10:00 PM      Component Value Range Comment   WBC 6.5  4.0 - 10.5 (K/uL)    RBC 3.56 (*) 4.22 - 5.81 (MIL/uL)    Hemoglobin 10.7 (*) 13.0 - 17.0 (g/dL)    HCT 14.7 (*) 82.9 - 52.0 (%)    MCV 90.4  78.0 - 100.0 (fL)    MCH 30.1  26.0 - 34.0 (pg)    MCHC 33.2  30.0 - 36.0 (g/dL)    RDW 56.2 (*) 13.0 - 15.5 (%)    Platelets 137 (*) 150 - 400 (K/uL)   CARDIAC PANEL(CRET KIN+CKTOT+MB+TROPI)     Status: Normal   Collection Time   10/30/11 10:00 PM      Component Value Range Comment   Total CK 224  7 - 232 (U/L)    CK, MB 3.1  0.3 - 4.0 (ng/mL)    Troponin I <0.30  <0.30 (ng/mL)    Relative Index 1.4  0.0 - 2.5    COMPREHENSIVE METABOLIC PANEL     Status: Abnormal  Collection Time   10/30/11 10:00 PM      Component Value Range Comment   Sodium 138  135 - 145 (mEq/L)    Potassium 3.8  3.5 - 5.1 (mEq/L)    Chloride 104  96 - 112 (mEq/L)    CO2 26  19 - 32 (mEq/L)    Glucose, Bld 103 (*) 70 - 99 (mg/dL)    BUN 34 (*) 6 - 23 (mg/dL)    Creatinine, Ser 1.61 (*) 0.50 - 1.35 (mg/dL)    Calcium 8.9  8.4 - 10.5 (mg/dL)    Total Protein 6.2  6.0 - 8.3 (g/dL)    Albumin 3.0 (*) 3.5 - 5.2 (g/dL)    AST 57 (*) 0 - 37 (U/L)    ALT 30  0 - 53 (U/L)    Alkaline Phosphatase 101  39 - 117 (U/L)    Total Bilirubin 1.6 (*) 0.3 - 1.2 (mg/dL)    GFR calc non Af Amer 22 (*) >90 (mL/min)    GFR calc Af Amer 26 (*) >90 (mL/min)   PROTIME-INR     Status: Normal   Collection Time   10/30/11 10:00 PM      Component Value Range Comment   Prothrombin Time 14.8  11.6 - 15.2 (seconds)    INR 1.14  0.00 - 1.49    APTT     Status: Normal   Collection Time   10/30/11 10:00 PM      Component Value Range Comment   aPTT 29  24 - 37 (seconds)   LIPASE, BLOOD     Status: Normal   Collection Time   10/30/11 10:00 PM      Component Value Range Comment   Lipase 34  11 - 59 (U/L)   URINE RAPID DRUG SCREEN (HOSP PERFORMED)     Status: Abnormal   Collection Time   10/30/11 11:30 PM      Component Value Range  Comment   Opiates NONE DETECTED  NONE DETECTED     Cocaine POSITIVE (*) NONE DETECTED     Benzodiazepines POSITIVE (*) NONE DETECTED     Amphetamines NONE DETECTED  NONE DETECTED     Tetrahydrocannabinol POSITIVE (*) NONE DETECTED     Barbiturates POSITIVE (*) NONE DETECTED     Dg Chest 2 View  10/30/2011  *RADIOLOGY REPORT*  Clinical Data: Pain with some shortness of breath.  CHEST - 2 VIEW  Comparison: Of 02/12  Findings: The cardiopericardial silhouette is enlarged.  The patient is status post mitral valve replacement.  No edema or focal airspace consolidation. Imaged bony structures of the thorax are intact. Telemetry leads overlie the chest.  IMPRESSION: Stable.  Cardiomegaly without acute findings.  Original Report Authenticated By: ERIC A. MANSELL, M.D.      Assessment Present on Admission:  .Chest pain .GERD (gastroesophageal reflux disease) .Diarrhea .HYPERTENSION .Mitral valvular regurgitation   PLAN: Although I suspect that his chest discomfort is GI in etiology, he has enough risk factors and with EKG changes, it is imperative that he is to be admitted for rule out.  Notice that his drug screen was also positive for cocaine, and he can have cocaine induced coronary spasm causing his chest discomfort.   The constellation of symptoms including heartburn, nausea, diarrhea is suggestive of a viral gastroenteritis however. He also has mild epigastric and right upper quadrant pain, and I will get an abdominal ultrasound to rule out cholelithiasis as well.  He is otherwise stable and will  be admitted to [facility] hospitalist service.   Other plans as per orders.    Manual Navarra 10/31/2011, 1:53 AM

## 2011-10-31 NOTE — ED Notes (Signed)
Pt to US via stretcher

## 2011-11-01 DIAGNOSIS — F1910 Other psychoactive substance abuse, uncomplicated: Secondary | ICD-10-CM | POA: Diagnosis present

## 2011-11-01 DIAGNOSIS — R0789 Other chest pain: Secondary | ICD-10-CM | POA: Diagnosis present

## 2011-11-01 LAB — CARDIAC PANEL(CRET KIN+CKTOT+MB+TROPI): Total CK: 166 U/L (ref 7–232)

## 2011-11-01 MED ORDER — OXYCODONE HCL 5 MG PO TABS: 5.0000 mg | ORAL_TABLET | ORAL | Status: AC | PRN

## 2011-11-01 MED ORDER — ONDANSETRON 4 MG PO TBDP: 4.0000 mg | ORAL_TABLET | Freq: Three times a day (TID) | ORAL | Status: AC | PRN

## 2011-11-01 NOTE — Progress Notes (Signed)
Discharge patient to home, family at bedside. D/c after eating dinner per MD, no complaints of nausea and vomiting no complaints of abdominal pain. PIV removed no swelling noted no s/s of infiltration.D/c  instructions  And follow up appts given to patient verbalized understanding.

## 2011-11-01 NOTE — Progress Notes (Signed)
Spoke with patient and wife at bedside. Per wife patient has pending Medicaid but needs PCP until approved. Agreed to appt at [facility], f/u appt scheduled for 01-02-11 at 1430 with Dr. Andrey Campanile, eligibility appt scheduled for 12-05-10 at 1030. Provided patient with written information on eligibility requirements. Patient did not speak much, wife was responding to questions when asked, patient did not acknowledge me when I asked to verify his name, finally spoke up to acknowledge that he was Ryan Winters, did not make eye contact. Patient agreed to above appt.

## 2011-11-01 NOTE — Discharge Summary (Signed)
DISCHARGE SUMMARY  Ryan Winters  MR#: [id number]  DOB:[birth year]  Date of Admission: 10/30/2011 Date of Discharge: 11/01/2011  Attending Physician:Tamala Manzer  Patient's PCP:No primary provider on file.  Consults:Treatment Team:  Lewayne Bunting, MD  Discharge Diagnoses: Present on Admission:  .Atypical chest pain. Marland KitchenGERD (gastroesophageal reflux disease) .Diarrhea resolved.  Marland KitchenHYPERTENSION .Mitral valvular regurgitation .Substance abuse Bipolar disorder.      Current Discharge Medication List    CONTINUE these medications which have NOT CHANGED   Details  amLODipine (NORVASC) 5 MG tablet Take 1 tablet (5 mg total) by mouth daily. Qty: 30 tablet, Refills: 0    ARIPiprazole (ABILIFY) 5 MG tablet Take 5 mg by mouth daily.      aspirin 81 MG EC tablet Take 81 mg by mouth daily.      fish oil-omega-3 fatty acids 1000 MG capsule Take 2 capsules (2 g total) by mouth daily. Qty: 30 capsule, Refills: 0    furosemide (LASIX) 80 MG tablet Take 1 tablet (80 mg total) by mouth 2 (two) times daily. Qty: 60 tablet, Refills: 6   Associated Diagnoses: Acute on chronic diastolic heart failure    hydrALAZINE (APRESOLINE) 25 MG tablet Take 12.5 mg by mouth 3 (three) times daily. Half tablet three times daily     hydrochlorothiazide (HYDRODIURIL) 25 MG tablet Take 1 tablet (25 mg total) by mouth daily. Qty: 30 tablet, Refills: 0    labetalol (NORMODYNE) 200 MG tablet Take 200 mg by mouth daily.      Multiple Vitamin (MULTIVITAMIN) tablet Take 1 tablet by mouth daily. Qty: 30 tablet, Refills: 0    risperiDONE (RISPERDAL) 3 MG tablet Take 3 mg by mouth at bedtime.      traZODone (DESYREL) 50 MG tablet Take 50 mg by mouth at bedtime. For sleep     vitamin C (ASCORBIC ACID) 500 MG tablet Take 1 tablet (500 mg total) by mouth daily. Qty: 30 tablet, Refills: 0      STOP taking these medications     traMADol (ULTRAM) 50 MG tablet      metoprolol (LOPRESSOR) 50 MG tablet          Brief history and physical:  Ryan Winters is an 50 y.o. male with history of hypertension, bipolar disorder, severe mitral regurgitation status post valvuloplasty, hypertension, schizophrenia, hyperlipidemia, presents to [facility] because of persistent epigastric and substernal chest discomfort along with profuse watery diarrhea for 3-4 days,  along with increased heartburn .Marland Kitchen Evaluation in emergency room included an EKG which showed new T wave inversions compared to the previous EKG. He was admitted to hospitalist service for evaluation of diarrhea and atypical chest pain.    Hospital Course: Present on Admission:   1)Chest pain (10/31/2011): Atypical, consistent with GERD. Cardiac enzymes are negative. Catheterization in July of 2011 prior to mitral valve repair showed normal coronary arteries. Follow up with cardiology as needed outpatient.  2)HYPERTENSION (06/14/2010) Controlled, b blockers held secondary to h/o cocaine abuse.  3)Mitral valvular regurgitation (10/02/2011) patient with known residual mitral regurgitation following mitral valve repair. He should followup with Dr. Excell Seltzer as scheduled after discharge.  4)abdominal pain/nausea and vomiting/ -etiology unclear. Probably viral gastroenteritis. Was able to tolerated liquid diet today, also told me he ate (hot dogs), for lunch, tolerated well without any symptoms.  gallbladder wall noted to be thickened on ultrasound, but no tenderness to palpation.  5) renal insufficiency-followed by nephrology  6) substance abuse-patients drug screen noted to have cocaine. Patient counseled on discontinuing.  Day of Discharge BP 109/66  Pulse 74  Temp(Src) 98.4 F (36.9 C) (Oral)  Resp 18  Ht 5\' 7"  (1.702 m)  Wt 69.673 kg (153 lb 9.6 oz)  BMI 24.06 kg/m2  SpO2 99%  Physical Exam: Pt is alert afebrile and comfortable.  chest - CTAB CV - RRR/normal S1 and S2; 3/6 systolic murmur  Abdomen non tender. Soft  Bowel sounds  heard. Ext-no edema, 2+ DP  Neuro-grossly nonfocal   Results for orders placed during the hospital encounter of 10/30/11 (from the past 24 hour(s))  CARDIAC PANEL(CRET KIN+CKTOT+MB+TROPI)     Status: Normal   Collection Time   10/31/11  4:00 PM      Component Value Range   Total CK 191  7 - 232 (U/L)   CK, MB 3.5  0.3 - 4.0 (ng/mL)   Troponin I <0.30  <0.30 (ng/mL)   Relative Index 1.8  0.0 - 2.5   CARDIAC PANEL(CRET KIN+CKTOT+MB+TROPI)     Status: Normal   Collection Time   11/01/11 12:03 AM      Component Value Range   Total CK 166  7 - 232 (U/L)   CK, MB 3.9  0.3 - 4.0 (ng/mL)   Troponin I <0.30  <0.30 (ng/mL)   Relative Index 2.3  0.0 - 2.5     Disposition: discharge home   Follow-up Appts: Discharge Orders    Future Appointments: Provider: Department: Dept Phone: Center:   11/16/2011 1:30 PM [facility]m [facility] [phone] [facility]   11/26/2011 11:00 AM Iva Boop, MD [facility] [phone] [facility]     Future Orders Please Complete By Expires   Diet - low sodium heart healthy      Increase activity slowly      Discharge instructions      Comments:   Follow up with cardiologist as needed.      Follow-up with PCP AS NEEDED.    SignedKathlen Mody 11/01/2011, 3:57 PM

## 2011-11-01 NOTE — Progress Notes (Signed)
Patient ID: Ryan Winters, male   DOB: Apr 24, 1961, 50 y.o.   MRN: [id number] @ Subjective:  Denies SSCP, palpitations or Dyspnea  Objective:  Filed Vitals:   10/31/11 1435 10/31/11 1649 10/31/11 2238 11/01/11 0705  BP: 129/98 112/77 110/74 124/76  Pulse: 86 69 73 80  Temp: 97.8 F (36.6 C) 97.7 F (36.5 C) 97.7 F (36.5 C) 98.3 F (36.8 C)  TempSrc: Oral Oral Oral Oral  Resp: 21 22 20 20   Height:  5\' 7"  (1.702 m)    Weight:  153 lb 9.6 oz (69.673 kg)    SpO2: 100% 98% 99% 99%    Intake/Output from previous day:  Intake/Output Summary (Last 24 hours) at 11/01/11 0726 Last data filed at 10/31/11 2241  Gross per 24 hour  Intake    240 ml  Output      0 ml  Net    240 ml    Physical Exam: General appearance: alert and no distress Lungs: clear to auscultation bilaterally Heart: Regular rate and rhythm loud MR murmur through repair Abdomen: soft, non-tender; bowel sounds normal; no masses,  no organomegaly Extremities: extremities normal, atraumatic, no cyanosis or edema Pulses: 2+ and symmetric Neurologic: Grossly normal  Lab Results: Basic Metabolic Panel:  Basename 10/30/11 2200  NA 138  K 3.8  CL 104  CO2 26  GLUCOSE 103*  BUN 34*  CREATININE 3.03*  CALCIUM 8.9  MG --  PHOS --   Liver Function Tests:  Basename 10/30/11 2200  AST 57*  ALT 30  ALKPHOS 101  BILITOT 1.6*  PROT 6.2  ALBUMIN 3.0*    Basename 10/30/11 2200  LIPASE 34  AMYLASE --   CBC:  Basename 10/30/11 2200  WBC 6.5  NEUTROABS --  HGB 10.7*  HCT 32.2*  MCV 90.4  PLT 137*   Cardiac Enzymes:  Basename 11/01/11 0003 10/31/11 1600 10/30/11 2200  CKTOTAL 166 191 224  CKMB 3.9 3.5 3.1  CKMBINDEX -- -- --  TROPONINI <0.30 <0.30 <0.30    Imaging: Dg Chest 2 View  10/30/2011  *RADIOLOGY REPORT*  Clinical Data: Pain with some shortness of breath.  CHEST - 2 VIEW  Comparison: Of 02/12  Findings: The cardiopericardial silhouette is enlarged.  The patient is status post mitral  valve replacement.  No edema or focal airspace consolidation. Imaged bony structures of the thorax are intact. Telemetry leads overlie the chest.  IMPRESSION: Stable.  Cardiomegaly without acute findings.  Original Report Authenticated By: ERIC A. MANSELL, M.D.   US Abdomen Complete  10/31/2011  *RADIOLOGY REPORT*  Clinical Data:  Abdominal pain with nausea.  High blood pressure. Kidney disease.  COMPLETE ABDOMINAL ULTRASOUND  Comparison:  11 08/02/2011 CT.  09/21/2010 ultrasound.  Findings:  Gallbladder:  No gallstones.  Diffuse wall edema measuring up to 9.1 mm.  Etiology indeterminate.  The patient was not tender over this region during scanning.  It is possible this is not related to cholecystitis and may be related to increased right heart pressures, metabolic abnormality or liver disease.  Clinical correlation recommended.  Common bile duct:  2.9 mm  Liver:  No focal lesion identified.  Within normal limits in parenchymal echogenicity.  IVC:  Appears normal.  Pancreas:  No focal abnormality seen.  Spleen:  8.2 cm. Accessory splenic tissue.  Right Kidney:  11.4 cm.  Tiny 5 mm cyst.  Mild increase echogenicity may be normal versus result of medical renal disease. No hydronephrosis.  Left Kidney:  9.8 cm. No hydronephrosis or  renal mass.  Abdominal aorta:  No aneurysm identified.  IMPRESSION: No gallstones.  Diffuse gallbladder wall thickening as discussed above.  Original Report Authenticated By: Fuller Canada, M.D.    Cardiac Studies:   Telemetry:  NSR no arrythmia   Medications:     . amLODipine  10 mg Oral Daily  . ARIPiprazole  5 mg Oral QHS  . aspirin EC  81 mg Oral Q1200  . fish oil-omega-3 fatty acids  2 g Oral Daily  . furosemide  80 mg Oral BID  . hydrALAZINE  12.5 mg Oral TID  . hydrochlorothiazide  25 mg Oral Daily  . labetalol  200 mg Oral Q1200  . multivitamins ther. w/minerals  1 tablet Oral Daily  . risperiDONE  3 mg Oral QHS  . sodium chloride  3 mL Intravenous Q12H  .  traZODone  50 mg Oral QHS  . vitamin C  500 mg Oral Daily  . DISCONTD: amLODipine  5 mg Oral Daily  . DISCONTD: ARIPiprazole  5 mg Oral Daily  . DISCONTD: aspirin EC  81 mg Oral Daily  . DISCONTD: labetalol  200 mg Oral Daily  . DISCONTD: metoprolol  50 mg Oral BID  . DISCONTD: multivitamin  1 tablet Oral Daily       Assessment/Plan:  Chest Pain:  In setting of cocaine  R/O no further w/u see Dr Ludwig Clarks note MR:  S/P repair with residual leak.  NO CHF  SBE prhphylaxis   HTN:  Continue current meds  Ok to D/C from cards perspective no further w/u planned  [name] 11/01/2011, 7:26 AM

## 2011-11-16 ENCOUNTER — Ambulatory Visit (AMBULATORY_SURGERY_CENTER): Payer: Self-pay | Admitting: *Deleted

## 2011-11-16 VITALS — Ht 66.0 in | Wt 154.0 lb

## 2011-11-16 DIAGNOSIS — Z1211 Encounter for screening for malignant neoplasm of colon: Secondary | ICD-10-CM

## 2011-11-16 MED ORDER — PEG-KCL-NACL-NASULF-NA ASC-C 100 G PO SOLR: ORAL | Status: DC

## 2011-11-21 ENCOUNTER — Encounter: Payer: Self-pay | Admitting: Internal Medicine

## 2011-11-26 ENCOUNTER — Other Ambulatory Visit: Payer: Self-pay | Admitting: Internal Medicine

## 2011-12-29 ENCOUNTER — Inpatient Hospital Stay (HOSPITAL_COMMUNITY)
Admission: EM | Admit: 2011-12-29 | Discharge: 2011-12-31 | DRG: 292 | Disposition: A | Discharge status: Home / Self Care | Payer: Medicaid Other | Attending: Internal Medicine | Admitting: Internal Medicine

## 2011-12-29 ENCOUNTER — Encounter (HOSPITAL_COMMUNITY): Payer: Self-pay | Admitting: Emergency Medicine

## 2011-12-29 ENCOUNTER — Emergency Department (HOSPITAL_COMMUNITY): Payer: Medicaid Other

## 2011-12-29 ENCOUNTER — Other Ambulatory Visit: Payer: Self-pay

## 2011-12-29 DIAGNOSIS — I509 Heart failure, unspecified: Secondary | ICD-10-CM | POA: Diagnosis present

## 2011-12-29 DIAGNOSIS — K5900 Constipation, unspecified: Secondary | ICD-10-CM | POA: Diagnosis present

## 2011-12-29 DIAGNOSIS — I340 Nonrheumatic mitral (valve) insufficiency: Secondary | ICD-10-CM | POA: Diagnosis present

## 2011-12-29 DIAGNOSIS — D631 Anemia in chronic kidney disease: Secondary | ICD-10-CM | POA: Diagnosis present

## 2011-12-29 DIAGNOSIS — I059 Rheumatic mitral valve disease, unspecified: Secondary | ICD-10-CM | POA: Diagnosis present

## 2011-12-29 DIAGNOSIS — N184 Chronic kidney disease, stage 4 (severe): Secondary | ICD-10-CM | POA: Diagnosis present

## 2011-12-29 DIAGNOSIS — F319 Bipolar disorder, unspecified: Secondary | ICD-10-CM | POA: Diagnosis present

## 2011-12-29 DIAGNOSIS — R079 Chest pain, unspecified: Secondary | ICD-10-CM | POA: Diagnosis present

## 2011-12-29 DIAGNOSIS — R110 Nausea: Secondary | ICD-10-CM | POA: Diagnosis present

## 2011-12-29 DIAGNOSIS — F1410 Cocaine abuse, uncomplicated: Secondary | ICD-10-CM

## 2011-12-29 DIAGNOSIS — F209 Schizophrenia, unspecified: Secondary | ICD-10-CM | POA: Diagnosis present

## 2011-12-29 DIAGNOSIS — I129 Hypertensive chronic kidney disease with stage 1 through stage 4 chronic kidney disease, or unspecified chronic kidney disease: Secondary | ICD-10-CM | POA: Diagnosis present

## 2011-12-29 DIAGNOSIS — F17200 Nicotine dependence, unspecified, uncomplicated: Secondary | ICD-10-CM | POA: Diagnosis present

## 2011-12-29 DIAGNOSIS — N289 Disorder of kidney and ureter, unspecified: Secondary | ICD-10-CM

## 2011-12-29 DIAGNOSIS — I5033 Acute on chronic diastolic (congestive) heart failure: Principal | ICD-10-CM | POA: Diagnosis present

## 2011-12-29 DIAGNOSIS — I10 Essential (primary) hypertension: Secondary | ICD-10-CM

## 2011-12-29 DIAGNOSIS — Z9189 Other specified personal risk factors, not elsewhere classified: Secondary | ICD-10-CM

## 2011-12-29 LAB — COMPREHENSIVE METABOLIC PANEL
ALT: 26 U/L (ref 0–53)
AST: 59 U/L — ABNORMAL HIGH (ref 0–37)
CO2: 23 mEq/L (ref 19–32)
Chloride: 109 mEq/L (ref 96–112)
GFR calc non Af Amer: 20 mL/min — ABNORMAL LOW (ref >90)
Sodium: 140 mEq/L (ref 135–145)
Total Bilirubin: 1.9 mg/dL — ABNORMAL HIGH (ref 0.3–1.2)

## 2011-12-29 LAB — CBC
HCT: 30.1 % — ABNORMAL LOW (ref 39.0–52.0)
Platelets: 120 10*3/uL — ABNORMAL LOW (ref 150–400)
RDW: 20.7 % — ABNORMAL HIGH (ref 11.5–15.5)
WBC: 7.3 10*3/uL (ref 4.0–10.5)

## 2011-12-29 LAB — DIFFERENTIAL
Basophils Absolute: 0 10*3/uL (ref 0.0–0.1)
Lymphocytes Relative: 19 % (ref 12–46)
Monocytes Relative: 10 % (ref 3–12)
Neutro Abs: 4.8 10*3/uL (ref 1.7–7.7)

## 2011-12-29 LAB — PRO B NATRIURETIC PEPTIDE: Pro B Natriuretic peptide (BNP): 3901 pg/mL — ABNORMAL HIGH (ref 0–125)

## 2011-12-29 LAB — POCT I-STAT TROPONIN I: Troponin i, poc: 0.03 ng/mL (ref 0.00–0.08)

## 2011-12-29 LAB — URINALYSIS, ROUTINE W REFLEX MICROSCOPIC
Glucose, UA: NEGATIVE mg/dL
Ketones, ur: NEGATIVE mg/dL
Protein, ur: 100 mg/dL — AB

## 2011-12-29 MED ORDER — ONDANSETRON HCL 4 MG/2ML IJ SOLN
INTRAMUSCULAR | Status: AC
  Filled 2011-12-29: qty 2

## 2011-12-29 MED ORDER — FUROSEMIDE 10 MG/ML IJ SOLN
40.0000 mg | Freq: Once | INTRAMUSCULAR | Status: AC
  Administered 2011-12-30: 40 mg via INTRAVENOUS
  Filled 2011-12-29: qty 4

## 2011-12-29 MED ORDER — ONDANSETRON HCL 4 MG/2ML IJ SOLN: 4.0000 mg | INTRAMUSCULAR | Status: DC | PRN

## 2011-12-29 MED ORDER — LORAZEPAM 2 MG/ML IJ SOLN
1.0000 mg | Freq: Once | INTRAMUSCULAR | Status: AC
  Administered 2011-12-29: 1 mg via INTRAVENOUS
  Filled 2011-12-29: qty 1

## 2011-12-29 MED ORDER — MORPHINE SULFATE 4 MG/ML IJ SOLN
4.0000 mg | INTRAMUSCULAR | Status: DC | PRN
  Administered 2011-12-29: 4 mg via INTRAVENOUS
  Filled 2011-12-29: qty 1

## 2011-12-29 MED ORDER — SODIUM CHLORIDE 0.9 % IV SOLN
INTRAVENOUS | Status: DC
  Administered 2011-12-29: 22:00:00 via INTRAVENOUS

## 2011-12-29 MED ORDER — ASPIRIN 81 MG PO CHEW
324.0000 mg | CHEWABLE_TABLET | Freq: Once | ORAL | Status: AC
  Administered 2011-12-29: 324 mg via ORAL
  Filled 2011-12-29: qty 4

## 2011-12-29 NOTE — ED Notes (Signed)
Pt presents to the ER with c/o chest pain, states started about 3 days ago, pt also reports constant and dry cough, states that chest pain located in all areas of the chest and radiate to the Left arm, pt also states was seen by PCP and was told that he has "touch of pneumonia" pt was given "antibiotic, cough medication and breathing treatment", about 3 weeks ago. Reports chest pain as 9/10

## 2011-12-30 ENCOUNTER — Other Ambulatory Visit: Payer: Self-pay

## 2011-12-30 ENCOUNTER — Encounter (HOSPITAL_COMMUNITY): Payer: Self-pay | Admitting: Internal Medicine

## 2011-12-30 DIAGNOSIS — I369 Nonrheumatic tricuspid valve disorder, unspecified: Secondary | ICD-10-CM

## 2011-12-30 LAB — RAPID URINE DRUG SCREEN, HOSP PERFORMED
Amphetamines: NOT DETECTED
Opiates: POSITIVE — AB
Tetrahydrocannabinol: NOT DETECTED

## 2011-12-30 LAB — CBC
HCT: 28.5 % — ABNORMAL LOW (ref 39.0–52.0)
Hemoglobin: 9.5 g/dL — ABNORMAL LOW (ref 13.0–17.0)
MCV: 90.2 fL (ref 78.0–100.0)
RBC: 3.16 MIL/uL — ABNORMAL LOW (ref 4.22–5.81)
WBC: 6.9 10*3/uL (ref 4.0–10.5)

## 2011-12-30 LAB — CARDIAC PANEL(CRET KIN+CKTOT+MB+TROPI)
CK, MB: 4.1 ng/mL — ABNORMAL HIGH (ref 0.3–4.0)
Relative Index: 1.9 (ref 0.0–2.5)
Troponin I: <0.3 ng/mL (ref <0.30)

## 2011-12-30 MED ORDER — LISINOPRIL 10 MG PO TABS
10.0000 mg | ORAL_TABLET | Freq: Every day | ORAL | Status: DC
  Administered 2011-12-30 – 2011-12-31 (×2): 10 mg via ORAL
  Filled 2011-12-30 (×4): qty 1

## 2011-12-30 MED ORDER — BISACODYL 5 MG PO TBEC: 5.0000 mg | DELAYED_RELEASE_TABLET | Freq: Every day | ORAL | Status: DC | PRN

## 2011-12-30 MED ORDER — AMLODIPINE BESYLATE 5 MG PO TABS
5.0000 mg | ORAL_TABLET | Freq: Every day | ORAL | Status: DC
  Administered 2011-12-30 – 2011-12-31 (×2): 5 mg via ORAL
  Filled 2011-12-30 (×4): qty 1

## 2011-12-30 MED ORDER — ASPIRIN EC 81 MG PO TBEC
81.0000 mg | DELAYED_RELEASE_TABLET | Freq: Every day | ORAL | Status: DC
  Administered 2011-12-30 – 2011-12-31 (×2): 81 mg via ORAL
  Filled 2011-12-30 (×3): qty 1

## 2011-12-30 MED ORDER — LABETALOL HCL 200 MG PO TABS
200.0000 mg | ORAL_TABLET | Freq: Every day | ORAL | Status: DC
  Administered 2011-12-30 – 2011-12-31 (×2): 200 mg via ORAL
  Filled 2011-12-30 (×4): qty 1

## 2011-12-30 MED ORDER — SODIUM CHLORIDE 0.9 % IJ SOLN
3.0000 mL | Freq: Two times a day (BID) | INTRAMUSCULAR | Status: DC
  Administered 2011-12-30 – 2011-12-31 (×3): 3 mL via INTRAVENOUS

## 2011-12-30 MED ORDER — DOCUSATE SODIUM 100 MG PO CAPS
200.0000 mg | ORAL_CAPSULE | Freq: Two times a day (BID) | ORAL | Status: DC
  Administered 2011-12-30 – 2011-12-31 (×3): 200 mg via ORAL
  Filled 2011-12-30 (×6): qty 2

## 2011-12-30 MED ORDER — ARIPIPRAZOLE 5 MG PO TABS
5.0000 mg | ORAL_TABLET | Freq: Every day | ORAL | Status: DC
  Administered 2011-12-30 – 2011-12-31 (×2): 5 mg via ORAL
  Filled 2011-12-30 (×3): qty 1

## 2011-12-30 MED ORDER — OXYCODONE HCL 5 MG PO TABS
5.0000 mg | ORAL_TABLET | Freq: Four times a day (QID) | ORAL | Status: DC | PRN
  Administered 2011-12-30 – 2011-12-31 (×2): 5 mg via ORAL
  Filled 2011-12-30 (×2): qty 1

## 2011-12-30 MED ORDER — LORAZEPAM 0.5 MG PO TABS
0.5000 mg | ORAL_TABLET | Freq: Four times a day (QID) | ORAL | Status: DC | PRN
  Administered 2011-12-30: 0.5 mg via ORAL
  Filled 2011-12-30: qty 1

## 2011-12-30 MED ORDER — MINERAL OIL RE ENEM
1.0000 | ENEMA | RECTAL | Status: DC | PRN
  Filled 2011-12-30: qty 1

## 2011-12-30 MED ORDER — HYDRALAZINE HCL 25 MG PO TABS
12.5000 mg | ORAL_TABLET | Freq: Three times a day (TID) | ORAL | Status: DC
  Administered 2011-12-30: 08:00:00 via ORAL
  Administered 2011-12-30 – 2011-12-31 (×4): 12.5 mg via ORAL
  Filled 2011-12-30 (×10): qty 0.5

## 2011-12-30 MED ORDER — SENNA 8.6 MG PO TABS
2.0000 | ORAL_TABLET | Freq: Every day | ORAL | Status: DC
  Administered 2011-12-30 – 2011-12-31 (×2): 17.2 mg via ORAL
  Filled 2011-12-30 (×2): qty 2

## 2011-12-30 MED ORDER — SODIUM CHLORIDE 0.9 % IV SOLN: 250.0000 mL | INTRAVENOUS | Status: DC | PRN

## 2011-12-30 MED ORDER — ONDANSETRON HCL 4 MG/2ML IJ SOLN
4.0000 mg | Freq: Four times a day (QID) | INTRAMUSCULAR | Status: DC | PRN
  Administered 2011-12-31 (×2): 4 mg via INTRAVENOUS
  Filled 2011-12-30 (×2): qty 2

## 2011-12-30 MED ORDER — RISPERIDONE 3 MG PO TABS
3.0000 mg | ORAL_TABLET | Freq: Every day | ORAL | Status: DC
  Filled 2011-12-30 (×3): qty 1

## 2011-12-30 MED ORDER — ACETAMINOPHEN 325 MG PO TABS: 650.0000 mg | ORAL_TABLET | ORAL | Status: DC | PRN

## 2011-12-30 MED ORDER — DOCUSATE SODIUM 100 MG PO CAPS
100.0000 mg | ORAL_CAPSULE | Freq: Every day | ORAL | Status: DC | PRN
  Filled 2011-12-30: qty 1

## 2011-12-30 MED ORDER — FUROSEMIDE 10 MG/ML IJ SOLN
80.0000 mg | Freq: Three times a day (TID) | INTRAMUSCULAR | Status: DC
  Administered 2011-12-31: 80 mg via INTRAVENOUS
  Filled 2011-12-30 (×3): qty 8

## 2011-12-30 MED ORDER — TRAZODONE HCL 50 MG PO TABS
50.0000 mg | ORAL_TABLET | Freq: Every day | ORAL | Status: DC
  Administered 2011-12-30: 50 mg via ORAL
  Filled 2011-12-30 (×3): qty 1

## 2011-12-30 MED ORDER — SODIUM CHLORIDE 0.9 % IJ SOLN: 3.0000 mL | INTRAMUSCULAR | Status: DC | PRN

## 2011-12-30 MED ORDER — POLYETHYLENE GLYCOL 3350 17 G PO PACK
17.0000 g | PACK | Freq: Every day | ORAL | Status: DC | PRN
  Filled 2011-12-30: qty 1

## 2011-12-30 MED ORDER — TRAMADOL HCL 50 MG PO TABS
50.0000 mg | ORAL_TABLET | Freq: Once | ORAL | Status: AC
  Administered 2011-12-30: 50 mg via ORAL
  Filled 2011-12-30: qty 1

## 2011-12-30 NOTE — H&P (Signed)
PCP:   Jackie Plum, MD, MD  Confirmed with pt Has appt to establish care with a cardiologist next week  Chief Complaint:  Nausea, inability to hold down meds, SOB, chest pain  HPI: 50yoM with h/o severe MR s/p MV repair/annuloplasty with known residual valvular  leakage, HTN, schizophrenia, CKD, chronic anemia, polysubstance abuse presents  with constipation, nausea leading to inability to hold down meds, and resultant  HF symptoms.   Pt was last admitted to [facility] 10/31/2011 with epigastric and chest discomfort  and water diarrhea x3-4 days. He had been seen the week before that admission in  the ED with the same symptoms, and had been admitted in 09/2011 with abdominal   pain that was self limited. He was admitted that time for TW inversions. He was  noted to have positive UTox for cocaine. Enzymes were negative. RUQ ultrasound  showed thickening gallbladder wall, but there was no TTP.   Pt now comes back with chief complaint of SOB for past 3 weeks, states his [place]  is [place] and he's more fatigued and SOB when walking up the hill. Also notes  orthopnea and has to sleep upright, but this is possibly chronic since his MV  surgery and not worsened, and also chest pain that radiates from the left chest  to his right arm. On further questioning, states that he's been very nauseous  recently and unable to hold down any of his medications because he vomits them  back up, including his Lasix. He feels constipated and states last BM was a week  ago. He went to see his PCP and was told he had PNA and was given ABx, breathing  treatments.   In the ED vitals were stable. Labs showed renal fxn 40/3.3. LFT's with AST 59,  Tbili 1.9. BNP 3901, Trop POC negative. WBC normal, Hct 30.1 stable, plts 120  stable. UA without infection. CXR showed cardiomegaly with mild edema. Abd plain  film with moderate stool burden, no SBO or free air. Pt was given 324 ASA,  Ativan, Morphine, 40 mg IV  Lasix.   ROS as above, o/w negative.   Past Medical History  Diagnosis Date  . Hypertension   . Bipolar disorder   . Schizophrenia     h/o hospitalized inpt therapy @ the [facility]  . 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 Hyperlipidemia   . Chronic kidney disease   . H/O: suicide attempt 1998    was hospitalized at [facility]  . Anemia     Hx of iron infusions  . CHF (congestive heart failure)     Hx of Class IV CHF    Past Surgical History  Procedure Date  . Mitral valve repair 2011  . Av fistula placement, radiocephalic 05/24/2011    Left arm  . Leg surgery    Medications:  HOME MEDS: Reconciled well with the patient  Prior to Admission medications   Medication Sig Start Date End Date Taking? Authorizing Provider  amLODipine (NORVASC) 5 MG tablet Take 1 tablet (5 mg total) by mouth daily. 10/03/11  Yes Michelle A. Ashley Royalty, MD  ARIPiprazole (ABILIFY) 5 MG tablet Take 5 mg by mouth daily.     Yes Historical Provider, MD  aspirin 81 MG EC tablet Take 81 mg by mouth daily.     Yes Historical Provider, MD  fish oil-omega-3 fatty acids 1000 MG  capsule Take 2 capsules (2 g total) by mouth daily. 10/03/11  Yes Michelle A. Ashley Royalty, MD  furosemide (LASIX) 80 MG tablet Take 1 tablet (80 mg total) by mouth 2 (two) times daily. 03/08/11  Yes Micheline Dantuono, MD  hydrALAZINE (APRESOLINE) 25 MG tablet Take 12.5 mg by mouth 3 (three) times daily.   Yes Historical Provider, MD  labetalol (NORMODYNE) 200 MG tablet Take 200 mg by mouth daily.     Yes Historical Provider, MD  Multiple Vitamin (MULTIVITAMIN) tablet Take 1 tablet by mouth daily. 10/03/11  Yes Michelle A. Ashley Royalty, MD  risperiDONE (RISPERDAL) 3 MG tablet Take 3 mg by mouth at bedtime.     Yes Historical Provider, MD  traZODone (DESYREL) 50 MG tablet Take 50 mg by mouth at bedtime. For sleep    Yes Historical Provider, MD  vitamin C  (ASCORBIC ACID) 500 MG tablet Take 1 tablet (500 mg total) by mouth daily. 10/03/11  Yes Michelle A. Ashley Royalty, MD    Allergies:  Allergies  Allergen Reactions  . Food Swelling    Strawberries, Cashews    Social History:   reports that he has been smoking Cigarettes.  He has smoked for the past 30 years. He has never used smokeless tobacco. He reports that he does not drink alcohol or use illicit drugs.  He lives at home with his wife and has a sister. He denies any cigs, drugs, alcohol at present.    Family History: Family History  Problem Relation Age of Onset  . Hypertension Mother     Physical Exam: Filed Vitals:   12/29/11 2116  BP: 154/97  Pulse: 93  Temp: 98 F (36.7 C)  TempSrc: Oral  Resp: 28  SpO2: 100%   Blood pressure 154/97, pulse 93, temperature 98 F (36.7 C), temperature source Oral, resp. rate 28, SpO2 100.00%. Gen: Thin, pleasant nice gentleman in no distress, able to relate history well,  calm and joking around, no difficulty breathing, able to speak full sentences,  no distress HEENT: Pupils round, scleral muddying noted, mouth/tongue dry Neck: Thin, supple, but with promnently distended extended jugular pulsations  noted Lungs: Actually very clear without any crackles, good air movement, normal exam Heart: regular, not tachycardic with prominent holosystolic murmur heard through  precordium, no gallops heard though Abd: Scaphoid, not obese, not tender, moderately distended, but not rigid, no  facial grimacing to palpation Extrem: Thin, with prominent veins noted. Warm, perfusing well. No BLE edema  noted.  Neuro: Alert, attentive, CN 2-12 intact, conversant without slurred speech,  standing and walking around the room on arrival, moves extremities on his own.  Psych: Has occasional jerks of his whole body that he states are due to his  nerves.   Labs & Imaging Results for orders placed during the hospital encounter of 12/29/11 (from the past 48  hour(s))  PRO B NATRIURETIC PEPTIDE     Status: Abnormal   Collection Time   12/29/11  9:56 PM      Component Value Range Comment   Pro B Natriuretic peptide (BNP) 3901.0 (*) 0 - 125 (pg/mL)   CBC     Status: Abnormal   Collection Time   12/29/11 10:10 PM      Component Value Range Comment   WBC 7.3  4.0 - 10.5 (K/uL)    RBC 3.34 (*) 4.22 - 5.81 (MIL/uL)    Hemoglobin 10.0 (*) 13.0 - 17.0 (g/dL)    HCT 41.3 (*) 24.4 - 52.0 (%)  MCV 90.1  78.0 - 100.0 (fL)    MCH 29.9  26.0 - 34.0 (pg)    MCHC 33.2  30.0 - 36.0 (g/dL)    RDW 16.1 (*) 09.6 - 15.5 (%)    Platelets 120 (*) 150 - 400 (K/uL)   DIFFERENTIAL     Status: Abnormal   Collection Time   12/29/11 10:10 PM      Component Value Range Comment   Neutrophils Relative 65  43 - 77 (%)    Lymphocytes Relative 19  12 - 46 (%)    Monocytes Relative 10  3 - 12 (%)    Eosinophils Relative 6 (*) 0 - 5 (%)    Basophils Relative 0  0 - 1 (%)    Neutro Abs 4.8  1.7 - 7.7 (K/uL)    Lymphs Abs 1.4  0.7 - 4.0 (K/uL)    Monocytes Absolute 0.7  0.1 - 1.0 (K/uL)    Eosinophils Absolute 0.4  0.0 - 0.7 (K/uL)    Basophils Absolute 0.0  0.0 - 0.1 (K/uL)   COMPREHENSIVE METABOLIC PANEL     Status: Abnormal   Collection Time   12/29/11 10:10 PM      Component Value Range Comment   Sodium 140  135 - 145 (mEq/L)    Potassium 3.9  3.5 - 5.1 (mEq/L)    Chloride 109  96 - 112 (mEq/L)    CO2 23  19 - 32 (mEq/L)    Glucose, Bld 94  70 - 99 (mg/dL)    BUN 40 (*) 6 - 23 (mg/dL)    Creatinine, Ser 0.45 (*) 0.50 - 1.35 (mg/dL)    Calcium 8.8  8.4 - 10.5 (mg/dL)    Total Protein 6.3  6.0 - 8.3 (g/dL)    Albumin 3.0 (*) 3.5 - 5.2 (g/dL)    AST 59 (*) 0 - 37 (U/L)    ALT 26  0 - 53 (U/L)    Alkaline Phosphatase 106  39 - 117 (U/L)    Total Bilirubin 1.9 (*) 0.3 - 1.2 (mg/dL)    GFR calc non Af Amer 20 (*) >90 (mL/min)    GFR calc Af Amer 24 (*) >90 (mL/min)   LIPASE, BLOOD     Status: Normal   Collection Time   12/29/11 10:10 PM      Component Value Range  Comment   Lipase 53  11 - 59 (U/L)   POCT I-STAT TROPONIN I     Status: Normal   Collection Time   12/29/11 10:22 PM      Component Value Range Comment   Troponin i, poc 0.03  0.00 - 0.08 (ng/mL)    Comment 3            URINALYSIS, ROUTINE W REFLEX MICROSCOPIC     Status: Abnormal   Collection Time   12/29/11 10:41 PM      Component Value Range Comment   Color, Urine AMBER (*) YELLOW  BIOCHEMICALS MAY BE AFFECTED BY COLOR   APPearance CLEAR  CLEAR     Specific Gravity, Urine 1.024  1.005 - 1.030     pH 6.5  5.0 - 8.0     Glucose, UA NEGATIVE  NEGATIVE (mg/dL)    Hgb urine dipstick LARGE (*) NEGATIVE     Bilirubin Urine NEGATIVE  NEGATIVE     Ketones, ur NEGATIVE  NEGATIVE (mg/dL)    Protein, ur 409 (*) NEGATIVE (mg/dL)    Urobilinogen, UA 1.0  0.0 -  1.0 (mg/dL)    Nitrite NEGATIVE  NEGATIVE     Leukocytes, UA NEGATIVE  NEGATIVE    URINE MICROSCOPIC-ADD ON     Status: Abnormal   Collection Time   12/29/11 10:41 PM      Component Value Range Comment   Squamous Epithelial / LPF RARE  RARE     RBC / HPF 3-6  <3 (RBC/hpf)    Bacteria, UA FEW (*) RARE    URINE RAPID DRUG SCREEN (HOSP PERFORMED)     Status: Abnormal   Collection Time   12/30/11 12:25 AM      Component Value Range Comment   Opiates POSITIVE (*) NONE DETECTED     Cocaine POSITIVE (*) NONE DETECTED     Benzodiazepines NONE DETECTED  NONE DETECTED     Amphetamines NONE DETECTED  NONE DETECTED     Tetrahydrocannabinol NONE DETECTED  NONE DETECTED     Barbiturates NONE DETECTED  NONE DETECTED     Dg Abd Acute W/chest  12/29/2011  *RADIOLOGY REPORT*  Clinical Data: Rule out small bowel obstruction, free air. Pneumonia, vomiting for 2 weeks.  ACUTE ABDOMEN SERIES (ABDOMEN 2 VIEW & CHEST 1 VIEW)  Comparison: CT of the abdomen pelvis 11/13/2012and chest x-ray 10/21/2011  Findings: The patient has had median sternotomy and valve replacement.  Heart is mildly enlarged.  There are Kerley B lines consistent with interstitial edema.  No  focal consolidations or pleural effusions are identified.  No free intraperitoneal air beneath the diaphragm.  Supine and erect views of the abdomen show a nonobstructive bowel gas pattern.  There is moderate stool throughout the colonic loops which appear normal in caliber.  Scattered pelvic phleboliths are present. Visualized osseous structures have a normal appearance.  IMPRESSION:  1.  Cardiomegaly and mild edema. 2.  Moderate stool burden. 3.  No evidence for free air or small bowel obstruction.  Original Report Authenticated By: Patterson Hammersmith, M.D.   TEE 09/2010 with thickened MV, two perivalvular leaks anteriorly, one small  perivalvular leak posteriorly, significant central jet of MR, ? coming from  posterior leaflet. Normal LV systolic fxn.   TTE 09/2010  Study Conclusions  - Left ventricle: The cavity size was normal. Wall thickness was    increased in a pattern of mild LVH. Systolic function was normal.    The estimated ejection fraction was in the range of 60% to 65%.    Wall motion was normal; there were no regional wall motion    abnormalities.  - Mitral valve: A bioprosthesis was present. Moderate regurgitation.    Valve area by pressure half-time: 2.27cm^2.  - Left atrium: The atrium was mildly dilated.  - Right ventricle: Systolic pressure was increased.  - Right atrium: The atrium was mildly dilated.  - Tricuspid valve: Moderate regurgitation.  - Pulmonary arteries: PA peak pressure: 51mm Hg (S).  ECG: NSR 93 bpm, normal axis, frank LVH by many criteria. No ST deviations, some  lateral TW flattening. Not ischemic appearing.   Impression Present on Admission:  .Acute on chronic diastolic heart failure .Mitral valvular regurgitation .SCHIZOPHRENIA .HYPERTENSION .Constipation .Nausea .CKD (chronic kidney disease), stage IV .Anemia in chronic kidney disease  50yoM with h/o severe MR s/p MV repair/annuloplasty with known residual valvular  leakage, HTN,  schizophrenia, CKD, chronic anemia, polysubstance abuse presents  with constipation, nausea leading to inability to hold down meds, and resultant  chest pain, HF symptoms.   1. Chest pain, elevated BNP, edema on CXR: Suspect the  CP is more cardiac  congestion and not ACS -- ECG no ischemic, POC Trop negative, had clean cath  reportedly in 05/2010.   He hasn't been able to tolerate his 80 mg Lasix BID due to nausea, and now  endorses HF symptoms, so this is likely etiology. Per records, pt has persistent  MV leak since repair in 2011, so decompensated valvular leak considered, and was  recommended to f/u with CT surgery; will get echo to evaluate. Worsened  diastolic HF also considered, given frank LVH on ECG.   Finally, congestion and pain could be simply due to constipation and abdominal  distention.   - Admit under HF protocol, get echo, trend cardiac enzymes x1 more then stop,   diuresis with 80mg  IV q8 x3 doses, MD titrate therafter  - Pt has appt next week to establish with a cardiologist for the first time,  which I encouraged him to keep, so once diuresed and feels better can likely  have him f/u with outpt cards.  - If echo more worrisome, consult cards vs CT surgery  - Continue home regimen of hydralazine, amlodipine, labetalol, ASA 81 daily  2. Nausea, no BM x1 week: No obstruction on plain film. Pt states he's  constipated. Will give aggressive BM regimen from above, pt defers enemas at  present.   3. CKD: Cr 3.3 is stable within his baseline.   4. Elevated AST and Tbili: AST is within chronically elevated baseline and is in  2:1 ratio to ALT. Tbili has been slowly rising, however RUQ u/s was done back in  09/2011 which noted GB edema that was not tender, and possibly due to right  heart failure or metabolic or liver disease.   5. Anemia: Hct is stable. Likely due to chronic renal disease.   6. Psych: continue home risperidone, trazodone, abilify.  7. Holding other  non essential meds   Telemetry, [facility] team 2 Presumed full code   Other plans as per orders.  Jeneen Doutt 12/30/2011, 1:47 AM

## 2011-12-30 NOTE — Progress Notes (Signed)
Attending made aware of patients elevated blood pressure this morning. Order given to administer morning blood pressure meds  Now.

## 2011-12-30 NOTE — ED Notes (Signed)
Patient denies pain and is resting comfortably.  

## 2011-12-30 NOTE — Progress Notes (Signed)
*  PRELIMINARY RESULTS* Echocardiogram 2D Echocardiogram has been performed.  Ryan Winters 12/30/2011, 11:48 AM

## 2011-12-30 NOTE — Progress Notes (Signed)
CARE MANAGEMENT NOTE 12/30/2011  Patient:  Ryan Winters, Ryan Winters   Account Number:  [id number]  Date Initiated:  12/30/2011  Documentation initiated by:  Eldar Robitaille  Subjective/Objective Assessment:   51 yo male admitted with HF. PTA pt lived with spouse.     Action/Plan:   Home when stable.   Anticipated DC Date:  12/31/2011   Anticipated DC Plan:  HOME W HOME HEALTH SERVICES  In-house referral  NA      DC Planning Services  CM consult      [facility] Choice  HOME HEALTH   Choice offered to / List presented to:  C-1 Patient   DME arranged  NA      DME agency  NA     HH arranged  HH-10 DISEASE MANAGEMENT      Status of service:  In process, will continue to follow Medicare Important Message given?   (If response is "NO", the following Medicare IM given date fields will be blank) Date Medicare IM given:   Date Additional Medicare IM given:    Discharge Disposition:    Per UR Regulation:    Comments:  12/30/11 1525 Leonie Green [phone] CM spoke with pt concerning Md order for heart failure home screen. Pt agress with plan, per pt choice [facility] to provide [facility] serivce. [facility] rep Mardella Layman notified of pt referral. Pt states home alone while spouse is at work, may need HHA. Pt states wanting to discuss needs with spouse.

## 2011-12-30 NOTE — ED Provider Notes (Signed)
History     CSN: [id number]  Arrival date & time 12/29/11  2110   First MD Initiated Contact with Patient 12/29/11 2128      Chief Complaint  Patient presents with  . Chest Pain  . Cough  . Nausea  . Emesis    Patient is a 51 y.o. male presenting with chest pain, cough, and vomiting. The history is provided by the patient and a relative. History Limited By: poor historian.  Chest Pain    Cough  Emesis    Pt was seen at 2140.  Per pt and family, c/o gradual onset and persistence of constant chest pain that began several hours PTA.  Has been assoc with SOB and several episodes of N/V since yesterday.  Describes the pain as "all over" with radiation into his left arm.  Pt has been unable to tol his usual PO home meds for the past several days due to N/V.  Pt states approx 3 weeks ago he was eval by his PMD for cough, dx with "a little pneumonia," rx "antibiotic, cough medicine, and breathing treatments."  States those symptoms did improve.  Denies palpitations, no abd pain, no back pain, no diarrhea, no rash, no fevers.         Past Medical History  Diagnosis Date  . Hypertension   . Bipolar disorder   . Schizophrenia     h/o hospitalized inpt therapy @ the [facility]  . 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 Hyperlipidemia   . Chronic kidney disease   . H/O: suicide attempt 1998    was hospitalized at [facility]  . Anemia     Hx of iron infusions  . CHF (congestive heart failure)     Hx of Class IV CHF    Past Surgical History  Procedure Date  . Mitral valve repair 2011  . Av fistula placement, radiocephalic 05/24/2011    Left arm  . Leg surgery     Family History  Problem Relation Age of Onset  . Hypertension Mother     History  Substance Use Topics  . Smoking status: Current Some Day Smoker -- 30 years    Types: Cigarettes  . Smokeless tobacco: Never Used  . Alcohol  Use: No     Quit alcohol in July of 2011      Review of Systems  Unable to perform ROS: Other    Allergies  Food  Home Medications   Current Outpatient Rx  Name Route Sig Dispense Refill  . AMLODIPINE BESYLATE 5 MG PO TABS Oral Take 1 tablet (5 mg total) by mouth daily. 30 tablet 0  . ARIPIPRAZOLE 5 MG PO TABS Oral Take 5 mg by mouth daily.      . ASPIRIN 81 MG PO TBEC Oral Take 81 mg by mouth daily.      . OMEGA-3 FATTY ACIDS 1000 MG PO CAPS Oral Take 2 capsules (2 g total) by mouth daily. 30 capsule 0  . FUROSEMIDE 80 MG PO TABS Oral Take 1 tablet (80 mg total) by mouth 2 (two) times daily. 60 tablet 6  . HYDRALAZINE HCL 25 MG PO TABS Oral Take 12.5 mg by mouth 3 (three) times daily.    Marland Kitchen LABETALOL HCL 200 MG PO TABS Oral Take 200 mg by mouth daily.      Marland Kitchen ONE-DAILY MULTI VITAMINS PO TABS Oral Take  1 tablet by mouth daily. 30 tablet 0  . RISPERIDONE 3 MG PO TABS Oral Take 3 mg by mouth at bedtime.      . TRAZODONE HCL 50 MG PO TABS Oral Take 50 mg by mouth at bedtime. For sleep     . VITAMIN C 500 MG PO TABS Oral Take 1 tablet (500 mg total) by mouth daily. 30 tablet 0    BP 154/97  Pulse 93  Temp(Src) 98 F (36.7 C) (Oral)  Resp 28  SpO2 100%  Physical Exam 2145: Physical examination:  Nursing notes reviewed; Vital signs and O2 SAT reviewed;  Constitutional: Well developed, Well nourished, In no acute distress; Head:  Normocephalic, atraumatic; Eyes: EOMI, PERRL, No scleral icterus; ENMT: Mouth and pharynx normal, Mucous membranes dry; Neck: Supple, Full range of motion, No lymphadenopathy; Cardiovascular: Regular rate and rhythm, No murmur or gallop; Respiratory: Breath sounds coarse & equal bilaterally, No wheezes, Normal respiratory effort/excursion; Chest: Nontender, Movement normal; Abdomen: Soft, +mid-epigastric tenderness to palp, Nondistended, Normal bowel sounds; Extremities: Pulses normal, No tenderness, No edema, No calf edema or asymmetry.; Neuro: AA&Ox3, Major  CN grossly intact.  No gross focal motor or sensory deficits in extremities.; Skin: Color normal, Warm, Dry; Psych:  Affect flat, poor eye contact.   ED Course  Procedures   MDM  MDM Reviewed: nursing note, vitals and previous chart Reviewed previous: ECG Interpretation: ECG, labs and x-ray    Date: 12/30/2011  Rate: 93  Rhythm: normal sinus rhythm  QRS Axis: normal  Intervals: normal  ST/T Wave abnormalities: normal  Conduction Disutrbances:none  Narrative Interpretation:   Old EKG Reviewed: changes noted, flipped T-waves on previous EKG dated 10/30/2011 improved today.  Results for orders placed during the hospital encounter of 12/29/11  PRO B NATRIURETIC PEPTIDE      Component Value Range   Pro B Natriuretic peptide (BNP) 3901.0 (*) 0 - 125 (pg/mL)  CBC      Component Value Range   WBC 7.3  4.0 - 10.5 (K/uL)   RBC 3.34 (*) 4.22 - 5.81 (MIL/uL)   Hemoglobin 10.0 (*) 13.0 - 17.0 (g/dL)   HCT 16.1 (*) 09.6 - 52.0 (%)   MCV 90.1  78.0 - 100.0 (fL)   MCH 29.9  26.0 - 34.0 (pg)   MCHC 33.2  30.0 - 36.0 (g/dL)   RDW 04.5 (*) 40.9 - 15.5 (%)   Platelets 120 (*) 150 - 400 (K/uL)  DIFFERENTIAL      Component Value Range   Neutrophils Relative 65  43 - 77 (%)   Lymphocytes Relative 19  12 - 46 (%)   Monocytes Relative 10  3 - 12 (%)   Eosinophils Relative 6 (*) 0 - 5 (%)   Basophils Relative 0  0 - 1 (%)   Neutro Abs 4.8  1.7 - 7.7 (K/uL)   Lymphs Abs 1.4  0.7 - 4.0 (K/uL)   Monocytes Absolute 0.7  0.1 - 1.0 (K/uL)   Eosinophils Absolute 0.4  0.0 - 0.7 (K/uL)   Basophils Absolute 0.0  0.0 - 0.1 (K/uL)  COMPREHENSIVE METABOLIC PANEL      Component Value Range   Sodium 140  135 - 145 (mEq/L)   Potassium 3.9  3.5 - 5.1 (mEq/L)   Chloride 109  96 - 112 (mEq/L)   CO2 23  19 - 32 (mEq/L)   Glucose, Bld 94  70 - 99 (mg/dL)   BUN 40 (*) 6 - 23 (mg/dL)   Creatinine, Ser  3.30 (*) 0.50 - 1.35 (mg/dL)   Calcium 8.8  8.4 - 40.9 (mg/dL)   Total Protein 6.3  6.0 - 8.3 (g/dL)    Albumin 3.0 (*) 3.5 - 5.2 (g/dL)   AST 59 (*) 0 - 37 (U/L)   ALT 26  0 - 53 (U/L)   Alkaline Phosphatase 106  39 - 117 (U/L)   Total Bilirubin 1.9 (*) 0.3 - 1.2 (mg/dL)   GFR calc non Af Amer 20 (*) >90 (mL/min)   GFR calc Af Amer 24 (*) >90 (mL/min)  LIPASE, BLOOD      Component Value Range   Lipase 53  11 - 59 (U/L)  URINALYSIS, ROUTINE W REFLEX MICROSCOPIC      Component Value Range   Color, Urine AMBER (*) YELLOW    APPearance CLEAR  CLEAR    Specific Gravity, Urine 1.024  1.005 - 1.030    pH 6.5  5.0 - 8.0    Glucose, UA NEGATIVE  NEGATIVE (mg/dL)   Hgb urine dipstick LARGE (*) NEGATIVE    Bilirubin Urine NEGATIVE  NEGATIVE    Ketones, ur NEGATIVE  NEGATIVE (mg/dL)   Protein, ur 811 (*) NEGATIVE (mg/dL)   Urobilinogen, UA 1.0  0.0 - 1.0 (mg/dL)   Nitrite NEGATIVE  NEGATIVE    Leukocytes, UA NEGATIVE  NEGATIVE   POCT I-STAT TROPONIN I      Component Value Range   Troponin i, poc 0.03  0.00 - 0.08 (ng/mL)   Comment 3           URINE MICROSCOPIC-ADD ON      Component Value Range   Squamous Epithelial / LPF RARE  RARE    RBC / HPF 3-6  <3 (RBC/hpf)   Bacteria, UA FEW (*) RARE   URINE RAPID DRUG SCREEN (HOSP PERFORMED)      Component Value Range   Opiates POSITIVE (*) NONE DETECTED    Cocaine POSITIVE (*) NONE DETECTED    Benzodiazepines NONE DETECTED  NONE DETECTED    Amphetamines NONE DETECTED  NONE DETECTED    Tetrahydrocannabinol NONE DETECTED  NONE DETECTED    Barbiturates NONE DETECTED  NONE DETECTED    Results for TORRES, HARDENBROOK (MRN [id number]) as of 12/30/2011 01:31  Ref. Range 10/30/2011 22:00 12/29/2011 22:10  HGB Latest Range: 13.0-17.0 g/dL 21.3 (L) 08.6 (L)  HCT Latest Range: 39.0-52.0 % 32.2 (L) 30.1 (L)   Results for MYKA, LUKINS (MRN [id number]) as of 12/30/2011 01:31  Ref. Range 10/30/2011 22:00 12/29/2011 22:10  BUN Latest Range: 6-23 mg/dL 34 (H) 40 (H)  Creat Latest Range: 0.50-1.35 mg/dL 5.28 (H) 4.13 (H)    Dg Abd Acute W/chest 12/29/2011   *RADIOLOGY REPORT*  Clinical Data: Rule out small bowel obstruction, free air. Pneumonia, vomiting for 2 weeks.  ACUTE ABDOMEN SERIES (ABDOMEN 2 VIEW & CHEST 1 VIEW)  Comparison: CT of the abdomen pelvis 11/13/2012and chest x-ray 10/21/2011  Findings: The patient has had median sternotomy and valve replacement.  Heart is mildly enlarged.  There are Kerley B lines consistent with interstitial edema.  No focal consolidations or pleural effusions are identified.  No free intraperitoneal air beneath the diaphragm.  Supine and erect views of the abdomen show a nonobstructive bowel gas pattern.  There is moderate stool throughout the colonic loops which appear normal in caliber.  Scattered pelvic phleboliths are present. Visualized osseous structures have a normal appearance.  IMPRESSION:  1.  Cardiomegaly and mild edema. 2.  Moderate stool burden. 3.  No  evidence for free air or small bowel obstruction.  Original Report Authenticated By: Patterson Hammersmith, M.D.     [place].Curet:  Pt without N/V while in the ED.  Pt states he feels "better" after meds.  Only c/o "feeling really anxious now" and is requesting "something for my nerves."  H/H and BUN/Cr per baseline.  T/C to [facility] Dr. Kaylyn Layer, case discussed, including:  HPI, pertinent PM/SHx, VS/PE, dx testing, ED course and treatment.  Agreeable to admit.  Requests to obtain tele bed to team 2.            Laray Anger, DO 12/31/11 1515

## 2011-12-30 NOTE — Progress Notes (Signed)
Subjective:  Pain is improved. Complaining of constipation.  Objective: Weight change:   Intake/Output Summary (Last 24 hours) at 12/30/11 1206 Last data filed at 12/30/11 0900  Gross per 24 hour  Intake    120 ml  Output      0 ml  Net    120 ml    Filed Vitals:   12/30/11 0936  BP: 116/80  Pulse: 73  Temp:   Resp:   on exam  She is alert afebrile and comfortable cvs s1 s2 heard Lungs clear Abdomen soft non tender non distending. Bowel sounds heard Extremities: no pedal edema.   Lab Results: Results for orders placed during the hospital encounter of 12/29/11 (from the past 24 hour(s))  PRO B NATRIURETIC PEPTIDE     Status: Abnormal   Collection Time   12/29/11  9:56 PM      Component Value Range   Pro B Natriuretic peptide (BNP) 3901.0 (*) 0 - 125 (pg/mL)  CBC     Status: Abnormal   Collection Time   12/29/11 10:10 PM      Component Value Range   WBC 7.3  4.0 - 10.5 (K/uL)   RBC 3.34 (*) 4.22 - 5.81 (MIL/uL)   Hemoglobin 10.0 (*) 13.0 - 17.0 (g/dL)   HCT 40.9 (*) 81.1 - 52.0 (%)   MCV 90.1  78.0 - 100.0 (fL)   MCH 29.9  26.0 - 34.0 (pg)   MCHC 33.2  30.0 - 36.0 (g/dL)   RDW 91.4 (*) 78.2 - 15.5 (%)   Platelets 120 (*) 150 - 400 (K/uL)  DIFFERENTIAL     Status: Abnormal   Collection Time   12/29/11 10:10 PM      Component Value Range   Neutrophils Relative 65  43 - 77 (%)   Lymphocytes Relative 19  12 - 46 (%)   Monocytes Relative 10  3 - 12 (%)   Eosinophils Relative 6 (*) 0 - 5 (%)   Basophils Relative 0  0 - 1 (%)   Neutro Abs 4.8  1.7 - 7.7 (K/uL)   Lymphs Abs 1.4  0.7 - 4.0 (K/uL)   Monocytes Absolute 0.7  0.1 - 1.0 (K/uL)   Eosinophils Absolute 0.4  0.0 - 0.7 (K/uL)   Basophils Absolute 0.0  0.0 - 0.1 (K/uL)  COMPREHENSIVE METABOLIC PANEL     Status: Abnormal   Collection Time   12/29/11 10:10 PM      Component Value Range   Sodium 140  135 - 145 (mEq/L)   Potassium 3.9  3.5 - 5.1 (mEq/L)   Chloride 109  96 - 112 (mEq/L)   CO2 23  19 - 32 (mEq/L)   Glucose, Bld 94  70 - 99 (mg/dL)   BUN 40 (*) 6 - 23 (mg/dL)   Creatinine, Ser 9.56 (*) 0.50 - 1.35 (mg/dL)   Calcium 8.8  8.4 - 21.3 (mg/dL)   Total Protein 6.3  6.0 - 8.3 (g/dL)   Albumin 3.0 (*) 3.5 - 5.2 (g/dL)   AST 59 (*) 0 - 37 (U/L)   ALT 26  0 - 53 (U/L)   Alkaline Phosphatase 106  39 - 117 (U/L)   Total Bilirubin 1.9 (*) 0.3 - 1.2 (mg/dL)   GFR calc non Af Amer 20 (*) >90 (mL/min)   GFR calc Af Amer 24 (*) >90 (mL/min)  LIPASE, BLOOD     Status: Normal   Collection Time   12/29/11 10:10 PM  Component Value Range   Lipase 53  11 - 59 (U/L)  POCT I-STAT TROPONIN I     Status: Normal   Collection Time   12/29/11 10:22 PM      Component Value Range   Troponin i, poc 0.03  0.00 - 0.08 (ng/mL)   Comment 3           URINALYSIS, ROUTINE W REFLEX MICROSCOPIC     Status: Abnormal   Collection Time   12/29/11 10:41 PM      Component Value Range   Color, Urine AMBER (*) YELLOW    APPearance CLEAR  CLEAR    Specific Gravity, Urine 1.024  1.005 - 1.030    pH 6.5  5.0 - 8.0    Glucose, UA NEGATIVE  NEGATIVE (mg/dL)   Hgb urine dipstick LARGE (*) NEGATIVE    Bilirubin Urine NEGATIVE  NEGATIVE    Ketones, ur NEGATIVE  NEGATIVE (mg/dL)   Protein, ur 098 (*) NEGATIVE (mg/dL)   Urobilinogen, UA 1.0  0.0 - 1.0 (mg/dL)   Nitrite NEGATIVE  NEGATIVE    Leukocytes, UA NEGATIVE  NEGATIVE   URINE MICROSCOPIC-ADD ON     Status: Abnormal   Collection Time   12/29/11 10:41 PM      Component Value Range   Squamous Epithelial / LPF RARE  RARE    RBC / HPF 3-6  <3 (RBC/hpf)   Bacteria, UA FEW (*) RARE   URINE RAPID DRUG SCREEN (HOSP PERFORMED)     Status: Abnormal   Collection Time   12/30/11 12:25 AM      Component Value Range   Opiates POSITIVE (*) NONE DETECTED    Cocaine POSITIVE (*) NONE DETECTED    Benzodiazepines NONE DETECTED  NONE DETECTED    Amphetamines NONE DETECTED  NONE DETECTED    Tetrahydrocannabinol NONE DETECTED  NONE DETECTED    Barbiturates NONE DETECTED  NONE DETECTED    CBC     Status: Abnormal   Collection Time   12/30/11  5:46 AM      Component Value Range   WBC 6.9  4.0 - 10.5 (K/uL)   RBC 3.16 (*) 4.22 - 5.81 (MIL/uL)   Hemoglobin 9.5 (*) 13.0 - 17.0 (g/dL)   HCT 11.9 (*) 14.7 - 52.0 (%)   MCV 90.2  78.0 - 100.0 (fL)   MCH 30.1  26.0 - 34.0 (pg)   MCHC 33.3  30.0 - 36.0 (g/dL)   RDW 82.9 (*) 56.2 - 15.5 (%)   Platelets 103 (*) 150 - 400 (K/uL)  CARDIAC PANEL(CRET KIN+CKTOT+MB+TROPI)     Status: Abnormal   Collection Time   12/30/11  1:54 PM      Component Value Range   Total CK 220  7 - 232 (U/L)   CK, MB 4.1 (*) 0.3 - 4.0 (ng/mL)   Troponin I <0.30  <0.30 (ng/mL)   Relative Index 1.9  0.0 - 2.5      Micro Results: No results found for this or any previous visit (from the past 240 hour(s)).  Studies/Results: Dg Abd Acute W/chest  12/29/2011  *RADIOLOGY REPORT*  Clinical Data: Rule out small bowel obstruction, free air. Pneumonia, vomiting for 2 weeks.  ACUTE ABDOMEN SERIES (ABDOMEN 2 VIEW & CHEST 1 VIEW)  Comparison: CT of the abdomen pelvis 11/13/2012and chest x-ray 10/21/2011  Findings: The patient has had median sternotomy and valve replacement.  Heart is mildly enlarged.  There are Kerley B lines consistent with interstitial edema.  No  focal consolidations or pleural effusions are identified.  No free intraperitoneal air beneath the diaphragm.  Supine and erect views of the abdomen show a nonobstructive bowel gas pattern.  There is moderate stool throughout the colonic loops which appear normal in caliber.  Scattered pelvic phleboliths are present. Visualized osseous structures have a normal appearance.  IMPRESSION:  1.  Cardiomegaly and mild edema. 2.  Moderate stool burden. 3.  No evidence for free air or small bowel obstruction.  Original Report Authenticated By: Patterson Hammersmith, M.D.   Medications: Scheduled Meds:   . amLODipine  5 mg Oral Daily  . ARIPiprazole  5 mg Oral Daily  . aspirin  324 mg Oral Once  . aspirin EC  81 mg Oral  Daily  . docusate sodium  200 mg Oral BID  . furosemide  40 mg Intravenous Once  . furosemide  80 mg Intravenous Q8H  . hydrALAZINE  12.5 mg Oral TID  . labetalol  200 mg Oral Daily  . lisinopril  10 mg Oral Daily  . LORazepam  1 mg Intravenous Once  . risperiDONE  3 mg Oral QHS  . senna  2 tablet Oral Daily  . sodium chloride  3 mL Intravenous Q12H  . traMADol  50 mg Oral Once  . traZODone  50 mg Oral QHS   Continuous Infusions:   . DISCONTD: sodium chloride Stopped (12/30/11 0254)   PRN Meds:.sodium chloride, acetaminophen, bisacodyl, LORazepam, mineral oil, ondansetron (ZOFRAN) IV, polyethylene glycol, sodium chloride, DISCONTD:  morphine injection, DISCONTD: ondansetron  chest pain, HF symptoms.  1. Chest pain improved. , elevated BNP, edema on CXR: Suspect the CP is more cardiac  congestion and not ACS -- ECG no ischemic, POC Trop negative, had clean cath  reportedly in 05/2010.  He hasn't been able to tolerate his 80 mg Lasix BID due to nausea, and now  endorses HF symptoms, so this is likely etiology. Per records, pt has persistent  MV leak since repair in 2011, so decompensated valvular leak considered, and was  recommended to f/u with CT surgery; will get echo to evaluate. Worsened  diastolic HF also considered, given frank LVH on ECG.  Finally, congestion and pain could be simply due to constipation and abdominal  distention.  - Admit under HF protocol,  trend cardiac enzymes x1 more then stop,  diuresis with 80mg  IV q8 x3 doses, MD titrate therafter  - Pt has appt next week to establish with a cardiologist for the first time, which I encouraged him to keep, so once diuresed and feels better can likely have him f/u with outpt cards.  - If echo more worrisome, will consult cards vs CT surgery  - Continue home regimen of hydralazine, amlodipine, labetalol, ASA 81 daily  2. Nausea, no BM x1 week: No obstruction on plain film. Pt states he's constipated. Will give aggressive  BM regimen from above, pt defers enemas at present.  3. CKD: Cr 3.3 is stable within his baseline.  4. Elevated AST and Tbili: AST is within chronically elevated baseline and is in 2:1 ratio to ALT. Tbili has been slowly rising, however RUQ u/s was done back in 09/2011 which noted GB edema that was not tender, and possibly due to right heart failure or metabolic or liver disease.  5. Anemia: Hct is stable. Likely due to chronic renal disease.  6. Psych: continue home risperidone, trazodone, abilify.  7. Holding other non essential meds     LOS: 1 day  Tieshia Rettinger 12/30/2011, 12:06 PM

## 2011-12-31 LAB — COMPREHENSIVE METABOLIC PANEL
ALT: 21 U/L (ref 0–53)
AST: 36 U/L (ref 0–37)
Albumin: 2.6 g/dL — ABNORMAL LOW (ref 3.5–5.2)
Alkaline Phosphatase: 90 U/L (ref 39–117)
BUN: 32 mg/dL — ABNORMAL HIGH (ref 6–23)
CO2: 21 mEq/L (ref 19–32)
Calcium: 8.3 mg/dL — ABNORMAL LOW (ref 8.4–10.5)
Chloride: 105 mEq/L (ref 96–112)
Creatinine, Ser: 3.02 mg/dL — ABNORMAL HIGH (ref 0.50–1.35)
GFR calc Af Amer: 26 mL/min — ABNORMAL LOW (ref >90)
GFR calc non Af Amer: 23 mL/min — ABNORMAL LOW (ref >90)
Glucose, Bld: 85 mg/dL (ref 70–99)
Potassium: 3.8 mEq/L (ref 3.5–5.1)
Sodium: 134 mEq/L — ABNORMAL LOW (ref 135–145)
Total Bilirubin: 1 mg/dL (ref 0.3–1.2)
Total Protein: 5.3 g/dL — ABNORMAL LOW (ref 6.0–8.3)

## 2011-12-31 LAB — MAGNESIUM: Magnesium: 2.1 mg/dL (ref 1.5–2.5)

## 2011-12-31 LAB — CARDIAC PANEL(CRET KIN+CKTOT+MB+TROPI)
CK, MB: 3.5 ng/mL (ref 0.3–4.0)
CK, MB: 3.5 ng/mL (ref 0.3–4.0)
Total CK: 122 U/L (ref 7–232)
Troponin I: <0.3 ng/mL (ref <0.30)
Troponin I: <0.3 ng/mL (ref <0.30)

## 2011-12-31 LAB — CBC
HCT: 27.3 % — ABNORMAL LOW (ref 39.0–52.0)
Hemoglobin: 9.2 g/dL — ABNORMAL LOW (ref 13.0–17.0)
MCH: 30.6 pg (ref 26.0–34.0)
MCHC: 33.7 g/dL (ref 30.0–36.0)
MCV: 90.7 fL (ref 78.0–100.0)
Platelets: 110 10*3/uL — ABNORMAL LOW (ref 150–400)
RBC: 3.01 MIL/uL — ABNORMAL LOW (ref 4.22–5.81)
RDW: 20.8 % — ABNORMAL HIGH (ref 11.5–15.5)
WBC: 6.9 10*3/uL (ref 4.0–10.5)

## 2011-12-31 LAB — URINE CULTURE
Colony Count: NO GROWTH
Culture: NO GROWTH

## 2011-12-31 MED ORDER — DSS 100 MG PO CAPS: 100.0000 mg | ORAL_CAPSULE | Freq: Every day | ORAL | Status: DC | PRN

## 2011-12-31 MED ORDER — OXYCODONE HCL 5 MG PO TABS: 5.0000 mg | ORAL_TABLET | Freq: Three times a day (TID) | ORAL | Status: DC | PRN

## 2011-12-31 NOTE — Progress Notes (Signed)
   CARE MANAGEMENT NOTE 12/31/2011  Patient:  Ryan Winters, Ryan Winters   Account Number:  [id number]  Date Initiated:  12/30/2011  Documentation initiated by:  DAVIS,TYMEEKA  Subjective/Objective Assessment:   51 yo male admitted with HF. PTA pt lived with spouse.     Action/Plan:   Home when stable.   Anticipated DC Date:  12/31/2011   Anticipated DC Plan:  HOME W HOME HEALTH SERVICES  In-house referral  NA      DC Planning Services  CM consult      [facility] Choice  HOME HEALTH   Choice offered to / List presented to:  C-1 Patient   DME arranged  NA      DME agency  NA     HH arranged  HH-10 DISEASE MANAGEMENT      Status of service:  In process, will continue to follow Medicare Important Message given?   (If response is "NO", the following Medicare IM given date fields will be blank) Date Medicare IM given:   Date Additional Medicare IM given:    Discharge Disposition:  HOME/SELF CARE  Per UR Regulation:  Reviewed for med. necessity/level of care/duration of stay  Comments:  12-31-11 Ryan Winters [phone]  Spoke with patient at bedside while he was standing up in room eating fruit on lunch tray. States he has mult scheduled appts coming up. His PCP appt is on 01-09-12 at 1145, he has another appt on 01-03-12 (with cardilogist?) and also has f/u appt with nephrologist as well. Lives with wife and sister. Was already arranged with [facility] on yesterday 12-30-11 for [facility] RN services for CHF disease mgmt. Confirmed with Kristen with [facility]. No further needs assessed.    12/30/11 1525 Ryan Winters [phone] CM spoke with pt concerning Md order for heart failure home screen. Pt agress with plan, per pt choice [facility] to provide [facility] serivce. [facility] rep Mardella Layman notified of pt referral. Pt states home alone while spouse is at work, may need HHA. Pt states wanting to discuss needs with spouse.

## 2011-12-31 NOTE — Progress Notes (Signed)
UR completed 

## 2011-12-31 NOTE — Progress Notes (Signed)
Discharge instructions given to pt, verbalized understanding. Left the unit in stable condition. 

## 2011-12-31 NOTE — Discharge Summary (Addendum)
DISCHARGE SUMMARY  Ryan Winters  MR#: [id number]  DOB:1961-03-15  Date of Admission: 12/29/2011 Date of Discharge: 12/31/2011  Attending Physician:Ryan Winters  [name],[name], MD, MD  Consults: -None  Discharge Diagnoses: Present on Admission:  .Acute on chronic diastolic heart failure .Mitral valvular regurgitation .SCHIZOPHRENIA .HYPERTENSION .Constipation .Nausea .CKD (chronic kidney disease), stage IV .Anemia in chronic kidney disease Atypical Chest pain    Medication List  As of 12/31/2011  5:18 PM   TAKE these medications         ABILIFY 5 MG tablet   Generic drug: ARIPiprazole   Take 5 mg by mouth daily.      amLODipine 5 MG tablet   Commonly known as: NORVASC   Take 1 tablet (5 mg total) by mouth daily.      aspirin 81 MG EC tablet   Take 81 mg by mouth daily.      DSS 100 MG Caps   Take 100 mg by mouth daily as needed.      fish oil-omega-3 fatty acids 1000 MG capsule   Take 2 capsules (2 g total) by mouth daily.      furosemide 80 MG tablet   Commonly known as: LASIX   Take 1 tablet (80 mg total) by mouth 2 (two) times daily.      hydrALAZINE 25 MG tablet   Commonly known as: APRESOLINE   Take 12.5 mg by mouth 3 (three) times daily.      labetalol 200 MG tablet   Commonly known as: NORMODYNE   Take 200 mg by mouth daily.      multivitamin tablet   Take 1 tablet by mouth daily.      risperiDONE 3 MG tablet   Commonly known as: RISPERDAL   Take 3 mg by mouth at bedtime.      traZODone 50 MG tablet   Commonly known as: DESYREL   Take 50 mg by mouth at bedtime. For sleep      vitamin C 500 MG tablet   Commonly known as: ASCORBIC ACID   Take 1 tablet (500 mg total) by mouth daily.          Brief Admit Note:50yoM with h/o severe MR s/p MV repair/annuloplasty with known residual valvular  leakage, HTN, schizophrenia, CKD, chronic anemia, polysubstance abuse presents  with constipation, nausea leading to inability to  hold down meds, and resultant  HF symptoms.      Hospital Course: Chest pain: most likely related to cocaine use which has resolved. Cardiac enzymes have been negative so far.  Repeat 2d echo did not show any worsening of the MV leak. ECG showed frank LVH.  Acute on Chronic Diastolic Heart Failure: elevated BNP, edema on cxr, started on patient on iv lasix, diuresed him for 48hrs with iv lasix. And he is being discharged on home dose of lasix.   Constipation: relieved with stool softeners.  CKD: Cr 3.3 is stable within his baseline.   Elevated AST and Tbili: AST is within chronically elevated baseline and is in 2:1 ratio to ALT. Tbili has been slowly rising, however RUQ u/s was done back in 09/2011 which noted GB edema that was not tender, and possibly due to right heart failure or metabolic or liver disease.   Anemia: Hct is stable. Likely due to chronic renal disease.   Psych: continue home risperidone, trazodone, abilify.     Day of Discharge BP 123/84  Pulse 84  Temp(Src) 97.8 F (36.6 C) (Oral)  Resp 18  Ht 5\' 6"  (1.676 m)  Wt 68.947 kg (152 lb)  BMI 24.53 kg/m2  SpO2 100%  Physical Exam: She is alert afebrile and comfortable  cvs s1 s2 heard  Lungs clear  Abdomen soft non tender non distending.  Bowel sounds heard  Extremities: no pedal edema.    Results for orders placed during the hospital encounter of 12/29/11 (from the past 24 hour(s))  COMPREHENSIVE METABOLIC PANEL     Status: Abnormal   Collection Time   12/31/11  5:12 AM      Component Value Range   Sodium 134 (*) 135 - 145 (mEq/L)   Potassium 3.8  3.5 - 5.1 (mEq/L)   Chloride 105  96 - 112 (mEq/L)   CO2 21  19 - 32 (mEq/L)   Glucose, Bld 85  70 - 99 (mg/dL)   BUN 32 (*) 6 - 23 (mg/dL)   Creatinine, Ser 4.54 (*) 0.50 - 1.35 (mg/dL)   Calcium 8.3 (*) 8.4 - 10.5 (mg/dL)   Total Protein 5.3 (*) 6.0 - 8.3 (g/dL)   Albumin 2.6 (*) 3.5 - 5.2 (g/dL)   AST 36  0 - 37 (U/L)   ALT 21  0 - 53 (U/L)   Alkaline  Phosphatase 90  39 - 117 (U/L)   Total Bilirubin 1.0  0.3 - 1.2 (mg/dL)   GFR calc non Af Amer 23 (*) >90 (mL/min)   GFR calc Af Amer 26 (*) >90 (mL/min)  MAGNESIUM     Status: Normal   Collection Time   12/31/11  5:12 AM      Component Value Range   Magnesium 2.1  1.5 - 2.5 (mg/dL)  CBC     Status: Abnormal   Collection Time   12/31/11  5:12 AM      Component Value Range   WBC 6.9  4.0 - 10.5 (K/uL)   RBC 3.01 (*) 4.22 - 5.81 (MIL/uL)   Hemoglobin 9.2 (*) 13.0 - 17.0 (g/dL)   HCT 09.8 (*) 11.9 - 52.0 (%)   MCV 90.7  78.0 - 100.0 (fL)   MCH 30.6  26.0 - 34.0 (pg)   MCHC 33.7  30.0 - 36.0 (g/dL)   RDW 14.7 (*) 82.9 - 15.5 (%)   Platelets 110 (*) 150 - 400 (K/uL)  CARDIAC PANEL(CRET KIN+CKTOT+MB+TROPI)     Status: Abnormal   Collection Time   12/31/11  8:25 AM      Component Value Range   Total CK 137  7 - 232 (U/L)   CK, MB 3.5  0.3 - 4.0 (ng/mL)   Troponin I <0.30  <0.30 (ng/mL)   Relative Index 2.6 (*) 0.0 - 2.5   CARDIAC PANEL(CRET KIN+CKTOT+MB+TROPI)     Status: Abnormal   Collection Time   12/31/11  4:12 PM      Component Value Range   Total CK 122  7 - 232 (U/L)   CK, MB 3.5  0.3 - 4.0 (ng/mL)   Troponin I <0.30  <0.30 (ng/mL)   Relative Index 2.9 (*) 0.0 - 2.5     Disposition: Home   Follow-up Appts: Discharge Orders    Future Orders Please Complete By Expires   Diet - low sodium heart healthy      Activity as tolerated - No restrictions      Discharge instructions      Comments:   Follow up with Cardiology as scheduled.      Follow-up Information    Follow up with OSEI-BONSU,GEORGE, MD on  01/09/2012. (at 1145)    Contact information:   [address], [address] [place] [address] [phone]       Follow up with [facility]. [facility])    Contact information:   [address] [place], [place] [address] [phone]            Time spent in discharge (includes decision making & examination of pt): 45  minutes  Signed: Vincentina Winters 12/31/2011, 5:18 PM    Addendum  Discharge Medication:  Oxycodone IR 5mg  every 8 hours as needed. Prescribed for generalised body pains not releived by over the counter pain medications.

## 2012-01-02 ENCOUNTER — Other Ambulatory Visit: Payer: Self-pay

## 2012-01-02 ENCOUNTER — Emergency Department (HOSPITAL_COMMUNITY): Payer: Medicaid Other

## 2012-01-02 ENCOUNTER — Emergency Department (HOSPITAL_COMMUNITY)
Admission: EM | Admit: 2012-01-02 | Discharge: 2012-01-02 | Disposition: A | Discharge status: Home / Self Care | Payer: Medicaid Other | Source: Home / Self Care | Attending: Emergency Medicine | Admitting: Emergency Medicine

## 2012-01-02 ENCOUNTER — Encounter (HOSPITAL_COMMUNITY): Payer: Self-pay | Admitting: *Deleted

## 2012-01-02 DIAGNOSIS — N189 Chronic kidney disease, unspecified: Secondary | ICD-10-CM | POA: Insufficient documentation

## 2012-01-02 DIAGNOSIS — R10815 Periumbilic abdominal tenderness: Secondary | ICD-10-CM | POA: Insufficient documentation

## 2012-01-02 DIAGNOSIS — R1110 Vomiting, unspecified: Secondary | ICD-10-CM

## 2012-01-02 DIAGNOSIS — R079 Chest pain, unspecified: Secondary | ICD-10-CM | POA: Insufficient documentation

## 2012-01-02 DIAGNOSIS — I129 Hypertensive chronic kidney disease with stage 1 through stage 4 chronic kidney disease, or unspecified chronic kidney disease: Secondary | ICD-10-CM | POA: Insufficient documentation

## 2012-01-02 DIAGNOSIS — R0602 Shortness of breath: Secondary | ICD-10-CM | POA: Insufficient documentation

## 2012-01-02 DIAGNOSIS — R109 Unspecified abdominal pain: Secondary | ICD-10-CM | POA: Insufficient documentation

## 2012-01-02 LAB — PROTIME-INR: INR: 1.14 (ref 0.00–1.49)

## 2012-01-02 LAB — CARDIAC PANEL(CRET KIN+CKTOT+MB+TROPI)
CK, MB: 3.1 ng/mL (ref 0.3–4.0)
Relative Index: 1.9 (ref 0.0–2.5)
Total CK: 165 U/L (ref 7–232)
Troponin I: <0.3 ng/mL (ref <0.30)

## 2012-01-02 LAB — BASIC METABOLIC PANEL
CO2: 25 mEq/L (ref 19–32)
Calcium: 9 mg/dL (ref 8.4–10.5)
Chloride: 98 mEq/L (ref 96–112)
Glucose, Bld: 93 mg/dL (ref 70–99)
Sodium: 132 mEq/L — ABNORMAL LOW (ref 135–145)

## 2012-01-02 LAB — DIFFERENTIAL
Eosinophils Relative: 2 % (ref 0–5)
Lymphocytes Relative: 15 % (ref 12–46)
Lymphs Abs: 0.8 10*3/uL (ref 0.7–4.0)
Monocytes Absolute: 0.6 10*3/uL (ref 0.1–1.0)
Neutro Abs: 4 10*3/uL (ref 1.7–7.7)

## 2012-01-02 LAB — CBC
HCT: 30.1 % — ABNORMAL LOW (ref 39.0–52.0)
MCV: 88.8 fL (ref 78.0–100.0)
Platelets: 153 10*3/uL (ref 150–400)
RBC: 3.39 MIL/uL — ABNORMAL LOW (ref 4.22–5.81)
WBC: 5.5 10*3/uL (ref 4.0–10.5)

## 2012-01-02 MED ORDER — ASPIRIN 81 MG PO CHEW
324.0000 mg | CHEWABLE_TABLET | Freq: Once | ORAL | Status: AC
  Administered 2012-01-02: 324 mg via ORAL
  Filled 2012-01-02: qty 4

## 2012-01-02 MED ORDER — ONDANSETRON HCL 4 MG/2ML IJ SOLN
INTRAMUSCULAR | Status: AC
  Filled 2012-01-02: qty 2

## 2012-01-02 MED ORDER — ONDANSETRON HCL 4 MG/2ML IJ SOLN
4.0000 mg | Freq: Once | INTRAMUSCULAR | Status: AC
  Administered 2012-01-02: 4 mg via INTRAVENOUS

## 2012-01-02 MED ORDER — PROMETHAZINE HCL 25 MG/ML IJ SOLN
25.0000 mg | Freq: Once | INTRAMUSCULAR | Status: AC
  Administered 2012-01-02: 25 mg via INTRAVENOUS
  Filled 2012-01-02: qty 1

## 2012-01-02 MED ORDER — SODIUM CHLORIDE 0.9 % IV BOLUS (SEPSIS): 500.0000 mL | INTRAVENOUS | Status: DC

## 2012-01-02 MED ORDER — MORPHINE SULFATE 4 MG/ML IJ SOLN
6.0000 mg | INTRAMUSCULAR | Status: DC | PRN
  Administered 2012-01-02: 4 mg via INTRAVENOUS

## 2012-01-02 MED ORDER — MORPHINE SULFATE 4 MG/ML IJ SOLN
INTRAMUSCULAR | Status: AC
  Administered 2012-01-02: 4 mg via INTRAVENOUS
  Filled 2012-01-02: qty 1

## 2012-01-02 MED ORDER — MORPHINE SULFATE 2 MG/ML IJ SOLN
INTRAMUSCULAR | Status: AC
  Administered 2012-01-02: 2 mg via INTRAVENOUS
  Filled 2012-01-02: qty 1

## 2012-01-02 NOTE — ED Provider Notes (Signed)
History     CSN: [id number]  Arrival date & time 01/02/12  0508   First MD Initiated Contact with Patient 01/02/12 0602      Chief Complaint  Patient presents with  . Chest Pain    (Consider location/radiation/quality/duration/timing/severity/associated sxs/prior treatment) HPI Comments: Patient with history of hypertension, mitral valve prolapse, chronic kidney disease, and substance abuse of cocaine and marijuana presents emergency department with a chief complaint of abdominal pain and bilious vomiting. Pt was admitted on 2/9 for CP and emesis with dc on 2/11. Pt states he was symptom free until 16:00 last evening. Pain is located in the periumbilical region, ranked at a 10/10 in severity, is sharp in nature and does not radiate. Movement and retching worsen pain. Sitting up and still reduces pain. 1 oxycodone taken this AM that helped mildly. Pt reports bilious emesis >10 x and that he is unable to keep anything down. Pt denies recent use of illicit drugs. In addition pt states associated symptoms include chest tightness, SOB when lying down, but denies cough, hemoptysis, frothy sputum, edema, extremity weakness, hematemesis. Hematochezia, melania, fevers, night sweats, or chills. Pt states he had multiple BM while in hospital, but has not has one in 2 days.   Dr.Osei-Bonsu made apt on 2/20 for CT surgery GI apt tomorrow for colonoscopy (blood in stool 1 1/2 weeks ago)  PCP apt on 2/20   Patient is a 51 y.o. male presenting with abdominal pain. The history is provided by the patient.  Abdominal Pain The primary symptoms of the illness include abdominal pain, shortness of breath, nausea and vomiting. The primary symptoms of the illness do not include fever, fatigue, diarrhea, hematemesis, hematochezia, dysuria, vaginal discharge or vaginal bleeding. The current episode started yesterday. The onset of the illness was sudden. The problem has been gradually worsening.  The abdominal pain  began yesterday. The pain came on gradually. The abdominal pain has been gradually worsening since its onset. The abdominal pain is located in the periumbilical region. The abdominal pain does not radiate. The severity of the abdominal pain is 10/10. The abdominal pain is relieved by nothing. The abdominal pain is exacerbated by vomiting and movement.  The shortness of breath began today. The shortness of breath is mild. The patient's medical history is significant for CHF. The patient's medical history does not include COPD, asthma or chronic lung disease.  Nausea began yesterday. The nausea is exacerbated by food, motion, activity and stress.  The vomiting began yesterday. Vomiting occurs more than 10 times per day. The emesis contains bilious material.  The patient has not had a change in bowel habit. Symptoms associated with the illness do not include chills, anorexia, diaphoresis, heartburn, urgency, hematuria, frequency or back pain. Significant associated medical issues include substance abuse and cardiac disease. Significant associated medical issues do not include inflammatory bowel disease, gallstones or diverticulitis.    Past Medical History  Diagnosis Date  . Hypertension   . Bipolar disorder   . Schizophrenia     h/o hospitalized inpt therapy @ the [facility]  . 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 Hyperlipidemia   . Chronic kidney disease   . H/O: suicide attempt 1998    was hospitalized at [facility]  . Anemia     Hx of iron infusions  . CHF (congestive heart failure)     Hx of Class IV CHF  Past Surgical History  Procedure Date  . Mitral valve repair 2011  . Av fistula placement, radiocephalic 05/24/2011    Left arm  . Leg surgery     Family History  Problem Relation Age of Onset  . Hypertension Mother     History  Substance Use Topics  . Smoking status: Current Some Day  Smoker -- 30 years    Types: Cigarettes  . Smokeless tobacco: Never Used  . Alcohol Use: No     Quit alcohol in July of 2011      Review of Systems  Constitutional: Negative for fever, chills, diaphoresis and fatigue.  Respiratory: Positive for shortness of breath.   Gastrointestinal: Positive for nausea, vomiting and abdominal pain. Negative for heartburn, diarrhea, hematochezia, anorexia and hematemesis.  Genitourinary: Negative for dysuria, urgency, frequency, hematuria, vaginal bleeding and vaginal discharge.  Musculoskeletal: Negative for back pain.  All other systems reviewed and are negative.    Allergies  Food  Home Medications   Current Outpatient Rx  Name Route Sig Dispense Refill  . AMLODIPINE BESYLATE 5 MG PO TABS Oral Take 1 tablet (5 mg total) by mouth daily. 30 tablet 0  . ARIPIPRAZOLE 5 MG PO TABS Oral Take 5 mg by mouth daily.      . ASPIRIN 81 MG PO TBEC Oral Take 81 mg by mouth daily.      . DSS 100 MG CAPS Oral Take 100 mg by mouth daily as needed. 10 capsule 0  . OMEGA-3 FATTY ACIDS 1000 MG PO CAPS Oral Take 2 capsules (2 g total) by mouth daily. 30 capsule 0  . FUROSEMIDE 80 MG PO TABS Oral Take 1 tablet (80 mg total) by mouth 2 (two) times daily. 60 tablet 6  . HYDRALAZINE HCL 25 MG PO TABS Oral Take 12.5 mg by mouth 3 (three) times daily.    Marland Kitchen LABETALOL HCL 200 MG PO TABS Oral Take 200 mg by mouth daily.      Marland Kitchen ONE-DAILY MULTI VITAMINS PO TABS Oral Take 1 tablet by mouth daily. 30 tablet 0  . OXYCODONE HCL 5 MG PO TABS Oral Take 1 tablet (5 mg total) by mouth every 8 (eight) hours as needed. 15 tablet 0  . RISPERIDONE 3 MG PO TABS Oral Take 3 mg by mouth at bedtime.      . TRAZODONE HCL 50 MG PO TABS Oral Take 50 mg by mouth at bedtime. For sleep     . VITAMIN C 500 MG PO TABS Oral Take 1 tablet (500 mg total) by mouth daily. 30 tablet 0    BP 146/98  Pulse 79  Temp(Src) 98.1 F (36.7 C) (Oral)  Resp 22  SpO2 100%  Physical Exam  Nursing note  and vitals reviewed. Constitutional: He appears well-developed and well-nourished. No distress.  HENT:  Head: Normocephalic and atraumatic.  Eyes: Conjunctivae and EOM are normal. Pupils are equal, round, and reactive to light.  Neck: Normal range of motion. Neck supple. Normal carotid pulses and no JVD present. Carotid bruit is not present. No rigidity. Normal range of motion present.  Cardiovascular: Normal rate, regular rhythm, S1 normal, S2 normal, intact distal pulses and normal pulses.  Exam reveals no gallop and no friction rub.   Murmur heard.  Systolic murmur is present with a grade of 5/6   Diastolic murmur is present with a grade of 3/6       No pitting edema bilaterally, RRR, systolic ejection murmur on auscultations, distal pulses intact,  no carotid bruit or JVD.  Pulmonary/Chest: Effort normal and breath sounds normal. No accessory muscle usage or stridor. No respiratory distress. He has no wheezes. He has no rales. He exhibits no tenderness and no bony tenderness.  Abdominal: Bowel sounds are normal. There is tenderness in the periumbilical area. There is no rigidity, no rebound, no guarding, no CVA tenderness, no tenderness at McBurney's point and negative Murphy's sign.          Non pulsatile aorta or CVA tenderness  Skin: Skin is warm, dry and intact. No rash noted. He is not diaphoretic. No cyanosis. Nails show no clubbing.    ED Course  Procedures (including critical care time)  Labs Reviewed  CBC - Abnormal; Notable for the following:    RBC 3.39 (*)    Hemoglobin 10.0 (*)    HCT 30.1 (*)    RDW 20.0 (*)    All other components within normal limits  BASIC METABOLIC PANEL - Abnormal; Notable for the following:    Sodium 132 (*)    BUN 27 (*)    Creatinine, Ser 3.23 (*)    GFR calc non Af Amer 21 (*)    GFR calc Af Amer 24 (*)    All other components within normal limits  PRO B NATRIURETIC PEPTIDE - Abnormal; Notable for the following:    Pro B Natriuretic  peptide (BNP) 1948.0 (*)    All other components within normal limits  DIFFERENTIAL  PROTIME-INR  CARDIAC PANEL(CRET KIN+CKTOT+MB+TROPI)  OCCULT BLOOD, POC DEVICE  URINE RAPID DRUG SCREEN (HOSP PERFORMED)  URINALYSIS, ROUTINE W REFLEX MICROSCOPIC   Dg Chest 2 View  01/02/2012  *RADIOLOGY REPORT*  Clinical Data: Mid chest pain and pressure.  Nausea and vomiting.  CHEST - 2 VIEW  Comparison: 10/30/2011  Findings: Postoperative changes in the mediastinum with sternotomy wires and cardiac valve prosthesis.  Mild cardiac enlargement with normal pulmonary vascularity.  No focal airspace consolidation in the lungs.  No blunting of costophrenic angles.  No pneumothorax. Slight fibrosis in the right lung base.  Vague nodular opacities over the mid lungs probably represent prominent nipple shadows.  No significant change since previous study.  IMPRESSION: Mild cardiac enlargement.  No pulmonary vascular congestion or edema.  No focal consolidation.  Original Report Authenticated By: Marlon Pel, M.D.   Dg Abd 2 Views  01/02/2012  *RADIOLOGY REPORT*  Clinical Data: Upper abdominal pain and constipation with nausea and vomiting.  ABDOMEN - 2 VIEW  Comparison: 12/29/2011.  Findings: A fair amount of stool is seen in the colon.  Gas is seen in the rectosigmoid colon.  No definite small bowel dilatation.  No free air.  Visualized lung bases are clear.  IMPRESSION: Bowel gas pattern is suggestive of constipation.  Original Report Authenticated By: Reyes Ivan, M.D.    Date: 01/02/2012  Rate: 77  Rhythm: normal sinus rhythm  QRS Axis: left  Intervals: normal  ST/T Wave abnormalities: normal  Conduction Disutrbances:none  Narrative Interpretation: LVH  Old EKG Reviewed: unchanged, 12/30/2011    No diagnosis found.  Patient's pain and nausea treated one emergency department with morphine Phenergan and Zofran.  Patient is currently clinically stable and in no acute distress.  Patient was able to  tolerate by mouth challenge as well as morning meal.  MDM  Abdominal pain, N/V  Patient abdominal pain non-concerning for gallbladder, pancreas, appendix, or small bowel obstruction etiology.  Bowel gas pattern suggestive of constipation.  Patient has followup appointment with  gastroenterologist tomorrow.  Patient has been advised to keep all of his followup appointments including CT surgery for his mitral valve regurgitation and with his PCP.  Patient has argued been given Phenergan and oxycodone for pain and nausea.  Patient will be discharged home.  This patient has been seen and discussed with Dr.Yelverton who is agreeable with plan to discharge.        Jaci Carrel, [place] 01/02/12 [phone]

## 2012-01-02 NOTE — ED Notes (Signed)
Pt in c/o chest pain since last night, also dizziness and blurred vision, pt recently in hospital for same, check BP at home and noted it to be elevated, also n/v, states he has been unable to keep his BP medication down

## 2012-01-02 NOTE — ED Notes (Signed)
IV was DC'd  Without any problems.  Pt DC'd home with wife

## 2012-01-02 NOTE — Discharge Instructions (Signed)
Follow up with the Gastroenterologist or general surgeon listed above for further evaluation of your abdominal pain. Only use your pain medication for severe pain. Do not operate heavy machinery while on pain medication or muscle relaxer. Note that your pain medication contains acetaminophen (Tylenol) & its is not reccommended that you use additional acetaminophen (Tylenol) while taking this medication.   Abdominal Pain  Your exam might not show the exact reason you have abdominal pain. Since there are many different causes of abdominal pain, another checkup and more tests may be needed. It is very important to follow up for lasting (persistent) or worsening symptoms. A possible cause of abdominal pain in any person who still has his or her appendix is acute appendicitis. Appendicitis is often hard to diagnose. Normal blood tests, urine tests, ultrasound, and CT scans do not completely rule out early appendicitis or other causes of abdominal pain. Sometimes, only the changes that happen over time will allow appendicitis and other causes of abdominal pain to be determined. Other potential problems that may require surgery may also take time to become more apparent. Because of this, it is important that you follow all of the instructions below.   HOME CARE INSTRUCTIONS  Do not take laxatives unless directed by your caregiver. Rest as much as possible.  Do not eat solid food until your pain is gone: A diet of water, weak decaffeinated tea, broth or bouillon, gelatin, oral rehydration solutions (ORS), frozen ice pops, or ice chips may be helpful.  When pain is gone: Start a light diet (dry toast, crackers, applesauce, or white rice). Increase the diet slowly as long as it does not bother you. Eat no dairy products (including cheese and eggs) and no spicy, fatty, fried, or high-fiber foods.  Use no alcohol, caffeine, or cigarettes.  Take your regular medicines unless your caregiver told you not to.  Take any  prescribed medicine as directed.   SEEK IMMEDIATE MEDICAL CARE IF:  The pain does not go away.  You have a fever >101 that persists You keep throwing up (vomiting) or cannot drink liquids.  The pain becomes localized (Pain in the right side could possibly be appendicitis. In an adult, pain in the left lower portion of the abdomen could be colitis or diverticulitis). You pass bloody or black tarry stools.  You have shaking chills.  There is blood in your vomit or you see blood in your bowel movements.  Your bowel movements stop (become blocked) or you cannot pass gas.  You have bloody, frequent, or painful urination.  You have yellow discoloration in the skin or whites of the eyes.  Your stomach becomes bloated or bigger.  You have dizziness or fainting.  You have chest or back pain.

## 2012-01-02 NOTE — ED Notes (Signed)
Pt presents with c/c of abdominal pain and chest tightness.  St's he was recently in the hospital because he wasn't able to keep anything down, st's the nausea meds worked while he was in the hospital but once he went home they didn't.  St's he hasn't been able to keep anything for 1.5 days.  St's he went to his doctor and had tests run, hasn't gotten results.  St's the pain is primarily in his stomach.

## 2012-01-03 ENCOUNTER — Emergency Department (HOSPITAL_COMMUNITY)
Admission: EM | Admit: 2012-01-03 | Discharge: 2012-01-03 | Disposition: A | Discharge status: Home / Self Care | Payer: Medicaid Other | Attending: Emergency Medicine | Admitting: Emergency Medicine

## 2012-01-03 ENCOUNTER — Emergency Department (HOSPITAL_COMMUNITY): Payer: Medicaid Other

## 2012-01-03 ENCOUNTER — Encounter (HOSPITAL_COMMUNITY): Payer: Self-pay | Admitting: *Deleted

## 2012-01-03 DIAGNOSIS — I129 Hypertensive chronic kidney disease with stage 1 through stage 4 chronic kidney disease, or unspecified chronic kidney disease: Secondary | ICD-10-CM | POA: Insufficient documentation

## 2012-01-03 DIAGNOSIS — R109 Unspecified abdominal pain: Secondary | ICD-10-CM | POA: Insufficient documentation

## 2012-01-03 DIAGNOSIS — K3184 Gastroparesis: Secondary | ICD-10-CM | POA: Insufficient documentation

## 2012-01-03 DIAGNOSIS — R1110 Vomiting, unspecified: Secondary | ICD-10-CM | POA: Insufficient documentation

## 2012-01-03 DIAGNOSIS — N189 Chronic kidney disease, unspecified: Secondary | ICD-10-CM | POA: Insufficient documentation

## 2012-01-03 LAB — RAPID URINE DRUG SCREEN, HOSP PERFORMED
Amphetamines: NOT DETECTED
Cocaine: POSITIVE — AB
Opiates: POSITIVE — AB
Tetrahydrocannabinol: NOT DETECTED

## 2012-01-03 LAB — URINALYSIS, ROUTINE W REFLEX MICROSCOPIC
Bilirubin Urine: NEGATIVE
Glucose, UA: NEGATIVE mg/dL
Protein, ur: 100 mg/dL — AB
Urobilinogen, UA: 1 mg/dL (ref 0.0–1.0)

## 2012-01-03 LAB — CBC
HCT: 34.4 % — ABNORMAL LOW (ref 39.0–52.0)
MCH: 29.5 pg (ref 26.0–34.0)
MCHC: 33.1 g/dL (ref 30.0–36.0)
MCV: 88.9 fL (ref 78.0–100.0)
Platelets: 179 10*3/uL (ref 150–400)
RDW: 19.5 % — ABNORMAL HIGH (ref 11.5–15.5)
WBC: 5.3 10*3/uL (ref 4.0–10.5)

## 2012-01-03 LAB — CK: Total CK: 184 U/L (ref 7–232)

## 2012-01-03 LAB — COMPREHENSIVE METABOLIC PANEL
AST: 49 U/L — ABNORMAL HIGH (ref 0–37)
Albumin: 3.6 g/dL (ref 3.5–5.2)
BUN: 30 mg/dL — ABNORMAL HIGH (ref 6–23)
Calcium: 9.6 mg/dL (ref 8.4–10.5)
Creatinine, Ser: 3.22 mg/dL — ABNORMAL HIGH (ref 0.50–1.35)
Total Protein: 7.1 g/dL (ref 6.0–8.3)

## 2012-01-03 LAB — URINE MICROSCOPIC-ADD ON

## 2012-01-03 LAB — LIPASE, BLOOD: Lipase: 20 U/L (ref 11–59)

## 2012-01-03 MED ORDER — LORAZEPAM 2 MG/ML IJ SOLN
1.0000 mg | Freq: Once | INTRAMUSCULAR | Status: AC
  Administered 2012-01-03: 1 mg via INTRAVENOUS
  Filled 2012-01-03: qty 1

## 2012-01-03 MED ORDER — SODIUM CHLORIDE 0.9 % IV BOLUS (SEPSIS)
1000.0000 mL | Freq: Once | INTRAVENOUS | Status: AC
  Administered 2012-01-03: 1000 mL via INTRAVENOUS

## 2012-01-03 MED ORDER — METOCLOPRAMIDE HCL 10 MG PO TABS: 10.0000 mg | ORAL_TABLET | Freq: Four times a day (QID) | ORAL | Status: DC

## 2012-01-03 MED ORDER — SODIUM CHLORIDE 0.9 % IV BOLUS (SEPSIS)
500.0000 mL | Freq: Once | INTRAVENOUS | Status: AC
  Administered 2012-01-03: 500 mL via INTRAVENOUS

## 2012-01-03 MED ORDER — METOCLOPRAMIDE HCL 5 MG/ML IJ SOLN
10.0000 mg | Freq: Once | INTRAMUSCULAR | Status: AC
  Administered 2012-01-03: 10 mg via INTRAVENOUS
  Filled 2012-01-03: qty 2

## 2012-01-03 MED ORDER — ONDANSETRON HCL 4 MG/2ML IJ SOLN
4.0000 mg | Freq: Once | INTRAMUSCULAR | Status: AC
  Administered 2012-01-03: 4 mg via INTRAVENOUS
  Filled 2012-01-03: qty 2

## 2012-01-03 MED ORDER — PROMETHAZINE HCL 25 MG RE SUPP: 25.0000 mg | Freq: Four times a day (QID) | RECTAL | Status: DC | PRN

## 2012-01-03 MED ORDER — HYDROMORPHONE HCL PF 1 MG/ML IJ SOLN
1.0000 mg | Freq: Once | INTRAMUSCULAR | Status: AC
  Administered 2012-01-03: 1 mg via INTRAVENOUS
  Filled 2012-01-03: qty 1

## 2012-01-03 NOTE — ED Notes (Signed)
rn assessing pt, he reported that "he wanted more dilaudid". Rn told pt that he just received some. Pt then asked rn her name and said that he was going " to draw her a picture and that he would see her soon".

## 2012-01-03 NOTE — ED Notes (Signed)
Pt ambulatory, walking down hall.

## 2012-01-03 NOTE — Discharge Instructions (Signed)
Clear Liquid Diet The clear liquid dietconsists of foods that are liquid or will become liquid at room temperature.You should be able to see through the liquid and beverages. Examples of foods allowed on a clear liquid diet include fruit juice, broth or bouillon, gelatin, or frozen ice pops. The purpose of this diet is to provide necessary fluid, electrolytes such a sodium and potassium, and energy to keep the body functioning during times when you are not able to consume a regular diet.A clear liquid diet should not be continued for long periods of time as it is not nutritionally adequate.  REASONS FOR USING A CLEAR LIQUID DIET  In sudden onset (acute) conditions for a patient before or after surgery.   As the first step in oral feeding.   For fluid and electrolyte replacement in diarrheal diseases.   As a diet before certain medical tests are performed.  ADEQUACY The clear liquid diet is adequate only in ascorbic acid, according to the Recommended Dietary Allowances of the National Research Council. CHOOSING FOODS Breads and Starches  Allowed:  None are allowed.   Avoid: All are avoided.  Vegetables  Allowed:  Strained tomato or vegetable juice.   Avoid: Any others.  Fruit  Allowed:  Strained fruit juices and fruit drinks. Include 1 serving of citrus or vitamin C-enriched fruit juice daily.   Avoid: Any others.  Meat and Meat Substitutes  Allowed:  None are allowed.   Avoid: All are avoided.  Milk  Allowed:  None are allowed.   Avoid: All are avoided.  Soups and Combination Foods  Allowed:  Clear bouillon, broth, or strained broth-based soups.   Avoid: Any others.  Desserts and Sweets  Allowed:  Sugar, honey. High protein gelatin. Flavored gelatin, ices, or frozen ice pops that do not contain milk.   Avoid: Any others.  Fats and Oils  Allowed:  None are allowed.   Avoid: All are avoided.  Beverages  Allowed:  Carbonated beverages, cereal beverages, coffee  (regular or decaffeinated), or tea.   Avoid: Any others.  Condiments  Allowed:  Iodized salt.   Avoid: Any others, including pepper.  Supplements  Allowed:  Liquid nutrition beverages.   Avoid: Any others that contain lactose or fiber.  SAMPLE MEAL PLAN Breakfast  4 oz strained orange juice.    to 1 cup gelatin (plain or fortified).   1 cup beverage (coffee or tea).   Sugar, if desired.  Midmorning Snack   cup gelatin (plain or fortified).  Lunch  1 cup broth or consomm.   4 oz strained grapefruit juice.    cup gelatin (plain or fortified).   1 cup beverage (coffee or tea).   Sugar, if desired.  Midafternoon Snack   cup fruit ice.    cup strained fruit juice.  Dinner  1 cup broth or consomm.    cup cranberry juice.    cup flavored gelatin (plain or fortified).   1 cup beverage (coffee or tea).   Sugar, if desired.  Evening Snack  4 oz strained apple juice (vitamin C-fortified).    cup flavored gelatin (plain or fortified).  Document Released: 11/05/2005 Document Revised: 07/18/2011 Document Reviewed: 02/02/2011 [employer] Patient Information 2012 [employer]. 

## 2012-01-03 NOTE — ED Provider Notes (Signed)
Patient with ongoing abdominal pain for the last week which is not improving. Today urine with a large amount of hemoglobin but otherwise negative. CBC within normal limits. CMP with chronic renal insufficiency that is unchanged. CT of the abdomen with oral contrast only pending and CK. If testing is normal patient can be discharged home to followup with his PCP.  5:19 PM CT scan negative for acute pathology. CK within normal limits. Speaking with family patient has had these episodes intermittently for the last 3 weeks and then in the past. Sounds somewhat like gastroparesis. Will try Reglan and give the patient a prescription for Reglan and her PR Phenergan. They will follow up with her regular doctor and he has an appointment with a gastroenterologist in March.  Gwyneth Sprout, MD 01/03/12 1723

## 2012-01-03 NOTE — ED Notes (Signed)
Pt c/o multiple episodes of n/v every day since last Saturday. Seen here twice before for same, given Percocet and phenergan, these are not working.

## 2012-01-03 NOTE — ED Provider Notes (Signed)
History     CSN: [id number]  Arrival date & time 01/03/12  1246   First MD Initiated Contact with Patient 01/03/12 1430      Chief Complaint  Patient presents with  . Abdominal Pain  . Nausea  . Emesis    (Consider location/radiation/quality/duration/timing/severity/associated sxs/prior treatment) Patient is a 51 y.o. male presenting with abdominal pain and vomiting. The history is provided by the patient.  Abdominal Pain The primary symptoms of the illness include abdominal pain and vomiting. The primary symptoms of the illness do not include fever, shortness of breath, diarrhea or dysuria.  Symptoms associated with the illness do not include back pain.  Emesis  Associated symptoms include abdominal pain. Pertinent negatives include no cough, no diarrhea, no fever and no headaches.  pt w abd pain for past week, mid abd/perimbilical area. Constant. No exacerbating or alleviating factors. No radiation of pain. No back or flank pain. Unsure if has had same pain previously. N/v. States emesis clear, not bloody or bilious. Notes mild constipation w last bm 2 days ago. Denies prior abd surgery. No groin, scrotal or testicular pain. No dysyuria, hematuria or flank pain. No fever or chills. No cough or uri c/o. No chest pain. Denies fall or abd trauma. Denies wt loss.   Past Medical History  Diagnosis Date  . Hypertension   . Bipolar disorder   . Schizophrenia     h/o hospitalized inpt therapy @ the [facility]  . 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 Hyperlipidemia   . Chronic kidney disease   . H/O: suicide attempt 1998    was hospitalized at [facility]  . Anemia     Hx of iron infusions  . CHF (congestive heart failure)     Hx of Class IV CHF    Past Surgical History  Procedure Date  . Mitral valve repair 2011  . Av fistula placement, radiocephalic 05/24/2011    Left arm  . Leg surgery       Family History  Problem Relation Age of Onset  . Hypertension Mother     History  Substance Use Topics  . Smoking status: Current Some Day Smoker -- 30 years    Types: Cigarettes  . Smokeless tobacco: Never Used  . Alcohol Use: No     Quit alcohol in July of 2011      Review of Systems  Constitutional: Negative for fever.  HENT: Negative for sore throat and neck pain.   Eyes: Negative for redness.  Respiratory: Negative for cough and shortness of breath.   Cardiovascular: Negative for chest pain.  Gastrointestinal: Positive for vomiting and abdominal pain. Negative for diarrhea.  Genitourinary: Negative for dysuria and flank pain.  Musculoskeletal: Negative for back pain.  Skin: Negative for rash.  Neurological: Negative for headaches.  Hematological: Does not bruise/bleed easily.  Psychiatric/Behavioral: Negative for confusion.    Allergies  Food  Home Medications   Current Outpatient Rx  Name Route Sig Dispense Refill  . AMLODIPINE BESYLATE 5 MG PO TABS Oral Take 1 tablet (5 mg total) by mouth daily. 30 tablet 0  . ARIPIPRAZOLE 5 MG PO TABS Oral Take 5 mg by mouth daily.      . ASPIRIN 81 MG PO TBEC Oral Take 81 mg by mouth daily.      . DSS 100 MG CAPS Oral Take 100 mg by mouth daily  as needed. 10 capsule 0  . OMEGA-3 FATTY ACIDS 1000 MG PO CAPS Oral Take 2 capsules (2 g total) by mouth daily. 30 capsule 0  . FUROSEMIDE 80 MG PO TABS Oral Take 1 tablet (80 mg total) by mouth 2 (two) times daily. 60 tablet 6  . HYDRALAZINE HCL 25 MG PO TABS Oral Take 12.5 mg by mouth 3 (three) times daily.    Marland Kitchen LABETALOL HCL 200 MG PO TABS Oral Take 200 mg by mouth daily.      Marland Kitchen ONE-DAILY MULTI VITAMINS PO TABS Oral Take 1 tablet by mouth daily. 30 tablet 0  . OXYCODONE HCL 5 MG PO TABS Oral Take 1 tablet (5 mg total) by mouth every 8 (eight) hours as needed. 15 tablet 0  . RISPERIDONE 3 MG PO TABS Oral Take 3 mg by mouth at bedtime.      . TRAZODONE HCL 50 MG PO TABS Oral  Take 50 mg by mouth at bedtime. For sleep     . VITAMIN C 500 MG PO TABS Oral Take 1 tablet (500 mg total) by mouth daily. 30 tablet 0    BP 162/105  Pulse 93  Temp(Src) 98.4 F (36.9 C) (Oral)  Resp 18  SpO2 99%  Physical Exam  Nursing note and vitals reviewed. Constitutional: He is oriented to person, place, and time. He appears well-developed and well-nourished. No distress.  HENT:  Head: Atraumatic.  Eyes: Pupils are equal, round, and reactive to light.  Neck: Neck supple. No tracheal deviation present.  Cardiovascular: Normal rate, regular rhythm, normal heart sounds and intact distal pulses.   Pulmonary/Chest: Effort normal and breath sounds normal. No accessory muscle usage. No respiratory distress.  Abdominal: Soft. He exhibits no distension and no mass. There is tenderness. There is no rebound and no guarding.       Mid abd tenderness, no rebound or guarding. No incarc hernia felt.   Genitourinary:       No cva tenderness  Musculoskeletal: Normal range of motion. He exhibits no edema and no tenderness.  Neurological: He is alert and oriented to person, place, and time.  Skin: Skin is warm and dry.  Psychiatric: He has a normal mood and affect.    ED Course  Procedures (including critical care time)  Results for orders placed during the hospital encounter of 01/03/12  CBC      Component Value Range   WBC 5.3  4.0 - 10.5 (K/uL)   RBC 3.87 (*) 4.22 - 5.81 (MIL/uL)   Hemoglobin 11.4 (*) 13.0 - 17.0 (g/dL)   HCT 40.9 (*) 81.1 - 52.0 (%)   MCV 88.9  78.0 - 100.0 (fL)   MCH 29.5  26.0 - 34.0 (pg)   MCHC 33.1  30.0 - 36.0 (g/dL)   RDW 91.4 (*) 78.2 - 15.5 (%)   Platelets 179  150 - 400 (K/uL)  COMPREHENSIVE METABOLIC PANEL      Component Value Range   Sodium 135  135 - 145 (mEq/L)   Potassium 4.5  3.5 - 5.1 (mEq/L)   Chloride 98  96 - 112 (mEq/L)   CO2 26  19 - 32 (mEq/L)   Glucose, Bld 107 (*) 70 - 99 (mg/dL)   BUN 30 (*) 6 - 23 (mg/dL)   Creatinine, Ser 9.56  (*) 0.50 - 1.35 (mg/dL)   Calcium 9.6  8.4 - 21.3 (mg/dL)   Total Protein 7.1  6.0 - 8.3 (g/dL)   Albumin 3.6  3.5 - 5.2 (  g/dL)   AST 49 (*) 0 - 37 (U/L)   ALT 24  0 - 53 (U/L)   Alkaline Phosphatase 112  39 - 117 (U/L)   Total Bilirubin 2.5 (*) 0.3 - 1.2 (mg/dL)   GFR calc non Af Amer 21 (*) >90 (mL/min)   GFR calc Af Amer 24 (*) >90 (mL/min)  LIPASE, BLOOD      Component Value Range   Lipase 20  11 - 59 (U/L)  URINE RAPID DRUG SCREEN (HOSP PERFORMED)      Component Value Range   Opiates POSITIVE (*) NONE DETECTED    Cocaine POSITIVE (*) NONE DETECTED    Benzodiazepines NONE DETECTED  NONE DETECTED    Amphetamines NONE DETECTED  NONE DETECTED    Tetrahydrocannabinol NONE DETECTED  NONE DETECTED    Barbiturates NONE DETECTED  NONE DETECTED   URINALYSIS, ROUTINE W REFLEX MICROSCOPIC      Component Value Range   Color, Urine AMBER (*) YELLOW    APPearance CLEAR  CLEAR    Specific Gravity, Urine 1.025  1.005 - 1.030    pH 6.5  5.0 - 8.0    Glucose, UA NEGATIVE  NEGATIVE (mg/dL)   Hgb urine dipstick LARGE (*) NEGATIVE    Bilirubin Urine NEGATIVE  NEGATIVE    Ketones, ur TRACE (*) NEGATIVE (mg/dL)   Protein, ur 161 (*) NEGATIVE (mg/dL)   Urobilinogen, UA 1.0  0.0 - 1.0 (mg/dL)   Nitrite NEGATIVE  NEGATIVE    Leukocytes, UA NEGATIVE  NEGATIVE   URINE MICROSCOPIC-ADD ON      Component Value Range   Squamous Epithelial / LPF RARE  RARE    RBC / HPF 0-2  <3 (RBC/hpf)   Bacteria, UA FEW (*) RARE    Casts HYALINE CASTS (*) NEGATIVE    Dg Chest 2 View  01/02/2012  *RADIOLOGY REPORT*  Clinical Data: Mid chest pain and pressure.  Nausea and vomiting.  CHEST - 2 VIEW  Comparison: 10/30/2011  Findings: Postoperative changes in the mediastinum with sternotomy wires and cardiac valve prosthesis.  Mild cardiac enlargement with normal pulmonary vascularity.  No focal airspace consolidation in the lungs.  No blunting of costophrenic angles.  No pneumothorax. Slight fibrosis in the right lung  base.  Vague nodular opacities over the mid lungs probably represent prominent nipple shadows.  No significant change since previous study.  IMPRESSION: Mild cardiac enlargement.  No pulmonary vascular congestion or edema.  No focal consolidation.  Original Report Authenticated By: Marlon Pel, M.D.   Dg Abd 2 Views  01/02/2012  *RADIOLOGY REPORT*  Clinical Data: Upper abdominal pain and constipation with nausea and vomiting.  ABDOMEN - 2 VIEW  Comparison: 12/29/2011.  Findings: A fair amount of stool is seen in the colon.  Gas is seen in the rectosigmoid colon.  No definite small bowel dilatation.  No free air.  Visualized lung bases are clear.  IMPRESSION: Bowel gas pattern is suggestive of constipation.  Original Report Authenticated By: Reyes Ivan, M.D.   Dg Abd Acute W/chest  12/29/2011  *RADIOLOGY REPORT*  Clinical Data: Rule out small bowel obstruction, free air. Pneumonia, vomiting for 2 weeks.  ACUTE ABDOMEN SERIES (ABDOMEN 2 VIEW & CHEST 1 VIEW)  Comparison: CT of the abdomen pelvis 11/13/2012and chest x-ray 10/21/2011  Findings: The patient has had median sternotomy and valve replacement.  Heart is mildly enlarged.  There are Kerley B lines consistent with interstitial edema.  No focal consolidations or pleural effusions are identified.  No free intraperitoneal air beneath the diaphragm.  Supine and erect views of the abdomen show a nonobstructive bowel gas pattern.  There is moderate stool throughout the colonic loops which appear normal in caliber.  Scattered pelvic phleboliths are present. Visualized osseous structures have a normal appearance.  IMPRESSION:  1.  Cardiomegaly and mild edema. 2.  Moderate stool burden. 3.  No evidence for free air or small bowel obstruction.  Original Report Authenticated By: Patterson Hammersmith, M.D.       MDM  Iv ns. Pt requests nausea med. zofran iv. Pt anxious, hx same, ativan 1 mg iv. Labs.   Pt notes pain persists. No change to abd exam.  Dilaudid 1 mg iv. Labs reviewed. u dip pos hgb however 0-2 rbc, chronic renal insuff on labs, additional ivf, total ck added to labs. As persistent pain/tenderness, ct abd - pending (no iv contrast as cr elev).  Signed out to Dr Anitra Lauth at [phone], to check total ck when back (r/o rhabdo), also to check abd ct results, recheck pt,  and dispo appropriately.      Suzi Roots, MD 01/03/12 [phone]

## 2012-01-04 ENCOUNTER — Inpatient Hospital Stay (HOSPITAL_COMMUNITY)
Admission: AD | Admit: 2012-01-04 | Discharge: 2012-01-07 | DRG: 391 | Disposition: A | Discharge status: Home Health | Payer: Medicaid Other | Source: Ambulatory Visit | Attending: Internal Medicine | Admitting: Internal Medicine

## 2012-01-04 DIAGNOSIS — T40605A Adverse effect of unspecified narcotics, initial encounter: Secondary | ICD-10-CM | POA: Diagnosis present

## 2012-01-04 DIAGNOSIS — N184 Chronic kidney disease, stage 4 (severe): Secondary | ICD-10-CM | POA: Diagnosis present

## 2012-01-04 DIAGNOSIS — E785 Hyperlipidemia, unspecified: Secondary | ICD-10-CM | POA: Diagnosis present

## 2012-01-04 DIAGNOSIS — F319 Bipolar disorder, unspecified: Secondary | ICD-10-CM | POA: Diagnosis present

## 2012-01-04 DIAGNOSIS — K5900 Constipation, unspecified: Secondary | ICD-10-CM | POA: Diagnosis present

## 2012-01-04 DIAGNOSIS — D631 Anemia in chronic kidney disease: Secondary | ICD-10-CM | POA: Diagnosis present

## 2012-01-04 DIAGNOSIS — E860 Dehydration: Secondary | ICD-10-CM | POA: Diagnosis present

## 2012-01-04 DIAGNOSIS — Z79899 Other long term (current) drug therapy: Secondary | ICD-10-CM

## 2012-01-04 DIAGNOSIS — K3184 Gastroparesis: Principal | ICD-10-CM | POA: Diagnosis present

## 2012-01-04 DIAGNOSIS — Z7982 Long term (current) use of aspirin: Secondary | ICD-10-CM

## 2012-01-04 DIAGNOSIS — I129 Hypertensive chronic kidney disease with stage 1 through stage 4 chronic kidney disease, or unspecified chronic kidney disease: Secondary | ICD-10-CM | POA: Diagnosis present

## 2012-01-04 DIAGNOSIS — I5033 Acute on chronic diastolic (congestive) heart failure: Secondary | ICD-10-CM

## 2012-01-04 DIAGNOSIS — R109 Unspecified abdominal pain: Secondary | ICD-10-CM | POA: Diagnosis present

## 2012-01-04 DIAGNOSIS — F209 Schizophrenia, unspecified: Secondary | ICD-10-CM | POA: Diagnosis present

## 2012-01-04 DIAGNOSIS — F17200 Nicotine dependence, unspecified, uncomplicated: Secondary | ICD-10-CM | POA: Diagnosis present

## 2012-01-04 DIAGNOSIS — I509 Heart failure, unspecified: Secondary | ICD-10-CM | POA: Diagnosis present

## 2012-01-04 LAB — DIFFERENTIAL
Eosinophils Relative: 0 % (ref 0–5)
Lymphocytes Relative: 15 % (ref 12–46)
Lymphs Abs: 1.1 10*3/uL (ref 0.7–4.0)
Monocytes Relative: 11 % (ref 3–12)

## 2012-01-04 LAB — CBC
Platelets: 175 10*3/uL (ref 150–400)
RDW: 19.5 % — ABNORMAL HIGH (ref 11.5–15.5)
WBC: 7.4 10*3/uL (ref 4.0–10.5)

## 2012-01-04 LAB — COMPREHENSIVE METABOLIC PANEL
AST: 48 U/L — ABNORMAL HIGH (ref 0–37)
Albumin: 3.4 g/dL — ABNORMAL LOW (ref 3.5–5.2)
Alkaline Phosphatase: 101 U/L (ref 39–117)
Chloride: 99 mEq/L (ref 96–112)
Creatinine, Ser: 3.09 mg/dL — ABNORMAL HIGH (ref 0.50–1.35)
Potassium: 3.6 mEq/L (ref 3.5–5.1)
Total Bilirubin: 2.1 mg/dL — ABNORMAL HIGH (ref 0.3–1.2)
Total Protein: 6.3 g/dL (ref 6.0–8.3)

## 2012-01-04 LAB — PHOSPHORUS: Phosphorus: 3.5 mg/dL (ref 2.3–4.6)

## 2012-01-04 LAB — PROTIME-INR
INR: 1.16 (ref 0.00–1.49)
Prothrombin Time: 15 seconds (ref 11.6–15.2)

## 2012-01-04 LAB — PRO B NATRIURETIC PEPTIDE: Pro B Natriuretic peptide (BNP): 3432 pg/mL — ABNORMAL HIGH (ref 0–125)

## 2012-01-04 MED ORDER — ENOXAPARIN SODIUM 30 MG/0.3ML ~~LOC~~ SOLN
30.0000 mg | SUBCUTANEOUS | Status: DC
  Administered 2012-01-04 – 2012-01-06 (×3): 30 mg via SUBCUTANEOUS
  Filled 2012-01-04 (×4): qty 0.3

## 2012-01-04 MED ORDER — LABETALOL HCL 200 MG PO TABS
200.0000 mg | ORAL_TABLET | Freq: Every day | ORAL | Status: DC
  Administered 2012-01-04 – 2012-01-07 (×4): 200 mg via ORAL
  Filled 2012-01-04 (×4): qty 1

## 2012-01-04 MED ORDER — SODIUM CHLORIDE 0.9 % IV SOLN
INTRAVENOUS | Status: DC
  Administered 2012-01-05 – 2012-01-06 (×3): via INTRAVENOUS

## 2012-01-04 MED ORDER — PROMETHAZINE HCL 25 MG PO TABS: 12.5000 mg | ORAL_TABLET | Freq: Four times a day (QID) | ORAL | Status: DC | PRN

## 2012-01-04 MED ORDER — OXYCODONE HCL 5 MG PO TABS
5.0000 mg | ORAL_TABLET | Freq: Three times a day (TID) | ORAL | Status: DC | PRN
  Administered 2012-01-04 – 2012-01-05 (×3): 5 mg via ORAL
  Filled 2012-01-04 (×2): qty 1

## 2012-01-04 MED ORDER — RISPERIDONE 3 MG PO TABS
3.0000 mg | ORAL_TABLET | Freq: Every day | ORAL | Status: DC
  Administered 2012-01-05 – 2012-01-06 (×2): 3 mg via ORAL
  Filled 2012-01-04 (×4): qty 1

## 2012-01-04 MED ORDER — FUROSEMIDE 80 MG PO TABS
80.0000 mg | ORAL_TABLET | Freq: Two times a day (BID) | ORAL | Status: DC
  Filled 2012-01-04: qty 1

## 2012-01-04 MED ORDER — PROMETHAZINE HCL 25 MG/ML IJ SOLN
12.5000 mg | Freq: Four times a day (QID) | INTRAMUSCULAR | Status: DC | PRN
  Administered 2012-01-05: 12.5 mg via INTRAVENOUS
  Filled 2012-01-04: qty 1

## 2012-01-04 MED ORDER — ASPIRIN EC 81 MG PO TBEC
81.0000 mg | DELAYED_RELEASE_TABLET | Freq: Every day | ORAL | Status: DC
  Administered 2012-01-05 – 2012-01-07 (×3): 81 mg via ORAL
  Filled 2012-01-04 (×3): qty 1

## 2012-01-04 MED ORDER — AMLODIPINE BESYLATE 5 MG PO TABS
5.0000 mg | ORAL_TABLET | Freq: Every day | ORAL | Status: DC
  Administered 2012-01-04 – 2012-01-05 (×2): 5 mg via ORAL
  Filled 2012-01-04 (×2): qty 1

## 2012-01-04 MED ORDER — DOCUSATE SODIUM 100 MG PO CAPS
100.0000 mg | ORAL_CAPSULE | Freq: Every day | ORAL | Status: DC | PRN
  Filled 2012-01-04: qty 1

## 2012-01-04 MED ORDER — ADULT MULTIVITAMIN W/MINERALS CH
1.0000 | ORAL_TABLET | Freq: Every day | ORAL | Status: DC
  Administered 2012-01-05 – 2012-01-07 (×3): 1 via ORAL
  Filled 2012-01-04 (×3): qty 1

## 2012-01-04 MED ORDER — VITAMIN C 500 MG PO TABS
500.0000 mg | ORAL_TABLET | Freq: Every day | ORAL | Status: DC
  Administered 2012-01-05 – 2012-01-07 (×3): 500 mg via ORAL
  Filled 2012-01-04 (×3): qty 1

## 2012-01-04 MED ORDER — HYDRALAZINE HCL 25 MG PO TABS
12.5000 mg | ORAL_TABLET | Freq: Three times a day (TID) | ORAL | Status: DC
  Administered 2012-01-04 – 2012-01-05 (×2): 12.5 mg via ORAL
  Filled 2012-01-04 (×4): qty 0.5

## 2012-01-04 MED ORDER — TRAZODONE HCL 50 MG PO TABS
50.0000 mg | ORAL_TABLET | Freq: Every day | ORAL | Status: DC
  Administered 2012-01-05 – 2012-01-06 (×2): 50 mg via ORAL
  Filled 2012-01-04 (×4): qty 1

## 2012-01-04 MED ORDER — ALUM & MAG HYDROXIDE-SIMETH 200-200-20 MG/5ML PO SUSP: 30.0000 mL | Freq: Four times a day (QID) | ORAL | Status: DC | PRN

## 2012-01-04 MED ORDER — ARIPIPRAZOLE 5 MG PO TABS
5.0000 mg | ORAL_TABLET | Freq: Every day | ORAL | Status: DC
  Administered 2012-01-04 – 2012-01-07 (×4): 5 mg via ORAL
  Filled 2012-01-04 (×4): qty 1

## 2012-01-04 MED ORDER — METOCLOPRAMIDE HCL 5 MG/ML IJ SOLN
10.0000 mg | Freq: Four times a day (QID) | INTRAMUSCULAR | Status: DC
  Administered 2012-01-05 – 2012-01-07 (×11): 10 mg via INTRAVENOUS
  Filled 2012-01-04 (×14): qty 2

## 2012-01-04 NOTE — H&P (Signed)
PCP:  Jackie Plum, MD, MD   DOA:  01/04/2012  7:53 PM  Chief Complaint:  Nausea and vomiting  HPI: 51 year old male with history of gastroparesis, diastolic CHF (on lasix), HTN, depression presented to [facility] from PCP office secondary to intractable nausea and vomiting started 1 week prior to admission. As per patient he has been to [facility] ED 3 times over last week due to same symptoms but was always discharged home as his symptoms would slightly improve. On February 14th, 2013 he has had CT abd/pelvis with no significant findings. He complains of associated abdominal pain around epigastric area, 5-6/10 in intensity, non radiating and no alleviating factors. Patient denies feeling lightheaded or dizzy and reports no loss of consciousness. No complains of shortness of breath, chest pain, or fever or chills.  Allergies: Allergies  Allergen Reactions  . Food Swelling    Strawberries, Cashews    Prior to Admission medications   Medication Sig Start Date End Date Taking? Authorizing Provider  amLODipine (NORVASC) 5 MG tablet Take 1 tablet (5 mg total) by mouth daily. 10/03/11  Yes Michelle A. Ashley Royalty, MD  ARIPiprazole (ABILIFY) 5 MG tablet Take 5 mg by mouth daily.     Yes Historical Provider, MD  aspirin 81 MG EC tablet Take 81 mg by mouth daily.     Yes Historical Provider, MD  docusate sodium (COLACE) 100 MG capsule Take 100 mg by mouth daily as needed. For constipation   Yes Historical Provider, MD  fish oil-omega-3 fatty acids 1000 MG capsule Take 2 capsules (2 g total) by mouth daily. 10/03/11  Yes Michelle A. Ashley Royalty, MD  furosemide (LASIX) 80 MG tablet Take 1 tablet (80 mg total) by mouth 2 (two) times daily. 03/08/11  Yes Micheline Rocca, MD  hydrALAZINE (APRESOLINE) 25 MG tablet Take 12.5 mg by mouth 3 (three) times daily.   Yes Historical Provider, MD  labetalol (NORMODYNE) 200 MG tablet Take 200 mg by mouth daily.     Yes Historical Provider, MD  metoCLOPramide  (REGLAN) 10 MG tablet Take 1 tablet (10 mg total) by mouth every 6 (six) hours. 01/03/12 01/13/12 Yes Gwyneth Sprout, MD  Multiple Vitamin (MULTIVITAMIN) tablet Take 1 tablet by mouth daily. 10/03/11  Yes Michelle A. Ashley Royalty, MD  oxyCODONE (OXY IR/ROXICODONE) 5 MG immediate release tablet Take 5 mg by mouth every 8 (eight) hours as needed. For pain   Yes Historical Provider, MD  promethazine (PHENERGAN) 25 MG suppository Place 1 suppository (25 mg total) rectally every 6 (six) hours as needed for nausea. 01/03/12 01/10/12 Yes Gwyneth Sprout, MD  risperiDONE (RISPERDAL) 3 MG tablet Take 3 mg by mouth at bedtime.     Yes Historical Provider, MD  traZODone (DESYREL) 50 MG tablet Take 50 mg by mouth at bedtime. For sleep    Yes Historical Provider, MD  vitamin C (ASCORBIC ACID) 500 MG tablet Take 1 tablet (500 mg total) by mouth daily. 10/03/11  Yes Michelle A. Ashley Royalty, MD    Past Medical History  Diagnosis Date  . Hypertension   . Bipolar disorder   . Schizophrenia     h/o hospitalized inpt therapy @ the [facility]  . 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 Hyperlipidemia   . Chronic kidney disease   . H/O: suicide attempt 1998    was hospitalized at [facility]  . Anemia  Hx of iron infusions  . CHF (congestive heart failure)     Hx of Class IV CHF    Past Surgical History  Procedure Date  . Mitral valve repair 2011  . Av fistula placement, radiocephalic 05/24/2011    Left arm  . Leg surgery     Social History:  reports that he has been smoking Cigarettes.  He has smoked for the past 30 years. He has never used smokeless tobacco. He reports that he does not drink alcohol or use illicit drugs.  Family History  Problem Relation Age of Onset  . Hypertension Mother     Review of Systems:  Constitutional: Denies fever, chills, diaphoresis, appetite change and fatigue.  HEENT: Denies  photophobia, eye pain, redness, hearing loss, ear pain, congestion, sore throat, rhinorrhea, sneezing, mouth sores, trouble swallowing, neck pain, neck stiffness and tinnitus.   Respiratory: Denies SOB, DOE, cough, chest tightness,  and wheezing.   Cardiovascular: Denies chest pain, palpitations and leg swelling.  Gastrointestinal: as per HPI Genitourinary: Denies dysuria, urgency, frequency, hematuria, flank pain and difficulty urinating.  Musculoskeletal: Denies myalgias, back pain, joint swelling, arthralgias and gait problem.  Skin: Denies pallor, rash and wound.  Neurological: Denies dizziness, seizures, syncope, weakness, light-headedness, numbness and headaches.  Hematological: Denies adenopathy. Easy bruising, personal or family bleeding history  Psychiatric/Behavioral: Denies suicidal ideation, mood changes, confusion, nervousness, sleep disturbance and agitation   Physical Exam:  Filed Vitals:   01/04/12 2037  BP: 145/82  Pulse: 98  Temp: 98.6 F (37 C)  Resp: 20  Height: 5\' 6"  (1.676 m)  Weight: 68.1 kg (150 lb 2.1 oz)  SpO2: 100%    Constitutional: Vital signs reviewed.  Patient is a well-developed and well-nourished in no acute distress and cooperative with exam. Alert and oriented x3.  Head: Normocephalic and atraumatic Ear: TM normal bilaterally Mouth: no erythema or exudates, MMM Eyes: PERRL, EOMI, conjunctivae normal, No scleral icterus.  Neck: Supple, Trachea midline normal ROM, No JVD, mass, thyromegaly, or carotid bruit present.  Cardiovascular: RRR, S1 normal, S2 normal, no MRG, pulses symmetric and intact bilaterally Pulmonary/Chest: CTAB, no wheezes, rales, or rhonchi Abdominal: Soft. Tender over upper abdominal area, non-distended, bowel sounds are normal, no masses, organomegaly, or guarding present.  GU: no CVA tenderness Musculoskeletal: No joint deformities, erythema, or stiffness, ROM full and no nontender Ext: no edema and no cyanosis, pulses  palpable bilaterally (DP and PT) Hematology: no cervical, inginal, or axillary adenopathy.  Neurological: A&O x3, Strenght is normal and symmetric bilaterally, cranial nerve II-XII are grossly intact, no focal motor deficit, sensory intact to light touch bilaterally.  Skin: Warm, dry and intact. No rash, cyanosis, or clubbing.  Psychiatric: Normal mood and affect. speech and behavior is normal. Judgment and thought content normal. Cognition and memory are normal.   Labs on Admission:  Results for orders placed during the hospital encounter of 01/03/12 (from the past 48 hour(s))  URINALYSIS, ROUTINE W REFLEX MICROSCOPIC     Status: Abnormal   Collection Time   01/03/12  3:06 PM      Component Value Range Comment   Color, Urine AMBER (*) YELLOW  BIOCHEMICALS MAY BE AFFECTED BY COLOR   APPearance CLEAR  CLEAR     Specific Gravity, Urine 1.025  1.005 - 1.030     pH 6.5  5.0 - 8.0     Glucose, UA NEGATIVE  NEGATIVE (mg/dL)    Hgb urine dipstick LARGE (*) NEGATIVE     Bilirubin Urine  NEGATIVE  NEGATIVE     Ketones, ur TRACE (*) NEGATIVE (mg/dL)    Protein, ur 130 (*) NEGATIVE (mg/dL)    Urobilinogen, UA 1.0  0.0 - 1.0 (mg/dL)    Nitrite NEGATIVE  NEGATIVE     Leukocytes, UA NEGATIVE  NEGATIVE    URINE MICROSCOPIC-ADD ON     Status: Abnormal   Collection Time   01/03/12  3:06 PM      Component Value Range Comment   Squamous Epithelial / LPF RARE  RARE     RBC / HPF 0-2  <3 (RBC/hpf)    Bacteria, UA FEW (*) RARE     Casts HYALINE CASTS (*) NEGATIVE    URINE RAPID DRUG SCREEN (HOSP PERFORMED)     Status: Abnormal   Collection Time   01/03/12  3:07 PM      Component Value Range Comment   Opiates POSITIVE (*) NONE DETECTED     Cocaine POSITIVE (*) NONE DETECTED     Benzodiazepines NONE DETECTED  NONE DETECTED     Amphetamines NONE DETECTED  NONE DETECTED     Tetrahydrocannabinol NONE DETECTED  NONE DETECTED     Barbiturates NONE DETECTED  NONE DETECTED    CBC     Status: Abnormal    Collection Time   01/03/12  3:15 PM      Component Value Range Comment   WBC 5.3  4.0 - 10.5 (K/uL)    RBC 3.87 (*) 4.22 - 5.81 (MIL/uL)    Hemoglobin 11.4 (*) 13.0 - 17.0 (g/dL)    HCT 86.5 (*) 78.4 - 52.0 (%)    MCV 88.9  78.0 - 100.0 (fL)    MCH 29.5  26.0 - 34.0 (pg)    MCHC 33.1  30.0 - 36.0 (g/dL)    RDW 69.6 (*) 29.5 - 15.5 (%)    Platelets 179  150 - 400 (K/uL)   COMPREHENSIVE METABOLIC PANEL     Status: Abnormal   Collection Time   01/03/12  3:15 PM      Component Value Range Comment   Sodium 135  135 - 145 (mEq/L)    Potassium 4.5  3.5 - 5.1 (mEq/L)    Chloride 98  96 - 112 (mEq/L)    CO2 26  19 - 32 (mEq/L)    Glucose, Bld 107 (*) 70 - 99 (mg/dL)    BUN 30 (*) 6 - 23 (mg/dL)    Creatinine, Ser 2.84 (*) 0.50 - 1.35 (mg/dL)    Calcium 9.6  8.4 - 10.5 (mg/dL)    Total Protein 7.1  6.0 - 8.3 (g/dL)    Albumin 3.6  3.5 - 5.2 (g/dL)    AST 49 (*) 0 - 37 (U/L)    ALT 24  0 - 53 (U/L)    Alkaline Phosphatase 112  39 - 117 (U/L)    Total Bilirubin 2.5 (*) 0.3 - 1.2 (mg/dL)    GFR calc non Af Amer 21 (*) >90 (mL/min)    GFR calc Af Amer 24 (*) >90 (mL/min)   LIPASE, BLOOD     Status: Normal   Collection Time   01/03/12  3:15 PM      Component Value Range Comment   Lipase 20  11 - 59 (U/L)   CK     Status: Normal   Collection Time   01/03/12  4:08 PM      Component Value Range Comment   Total CK 184  7 - 232 (U/L)  Radiological Exams on Admission: No results found.  Assessment/Plan  Principal Problem:   *Nausea and vomiting - likely secondary to gastroparesis secondary to narcotic use - we will provide IV fluids with NS at 50 cc/hr due to patient's history of CHF - phenergan and Reglan for nausea and vomiting - keep NPO - check urine drug screen  Active Problems:   Anemia in chronic kidney disease, stage IV - creatinine 3.22 - likely secondary to dehydration due to intractable vomiting versus lasix - IV fluids at 50 cc/hr X 1 liter - hold lasix  temporarily - follow up admission labs and repeat am labs   Acute on chronic diastolic heart failure - check pro BNP - hold lasix due to worsening renal failure  Disposition - to medicine floor, telemetry monitoring  Time Spent on Admission: Greater than 30 minutes  Brandan Glauber 01/04/2012, 10:11 PM

## 2012-01-05 LAB — CBC
HCT: 31.2 % — ABNORMAL LOW (ref 39.0–52.0)
MCHC: 33.7 g/dL (ref 30.0–36.0)
Platelets: 212 10*3/uL (ref 150–400)
RDW: 19.4 % — ABNORMAL HIGH (ref 11.5–15.5)
WBC: 6.2 10*3/uL (ref 4.0–10.5)

## 2012-01-05 LAB — BASIC METABOLIC PANEL
BUN: 32 mg/dL — ABNORMAL HIGH (ref 6–23)
Calcium: 9.1 mg/dL (ref 8.4–10.5)
Chloride: 101 mEq/L (ref 96–112)
Creatinine, Ser: 2.88 mg/dL — ABNORMAL HIGH (ref 0.50–1.35)
GFR calc Af Amer: 28 mL/min — ABNORMAL LOW (ref >90)
GFR calc non Af Amer: 24 mL/min — ABNORMAL LOW (ref >90)

## 2012-01-05 LAB — TSH: TSH: 2.705 u[IU]/mL (ref 0.350–4.500)

## 2012-01-05 LAB — RAPID URINE DRUG SCREEN, HOSP PERFORMED
Cocaine: NOT DETECTED
Opiates: POSITIVE — AB

## 2012-01-05 MED ORDER — PROMETHAZINE HCL 25 MG PO TABS
12.5000 mg | ORAL_TABLET | ORAL | Status: DC | PRN
  Administered 2012-01-07: 12.5 mg via ORAL
  Filled 2012-01-05 (×2): qty 1

## 2012-01-05 MED ORDER — MORPHINE SULFATE 2 MG/ML IJ SOLN
2.0000 mg | INTRAMUSCULAR | Status: DC | PRN
  Administered 2012-01-05 – 2012-01-07 (×6): 2 mg via INTRAVENOUS
  Filled 2012-01-05 (×6): qty 1

## 2012-01-05 MED ORDER — FUROSEMIDE 40 MG PO TABS
40.0000 mg | ORAL_TABLET | Freq: Every day | ORAL | Status: DC
  Administered 2012-01-05 – 2012-01-07 (×3): 40 mg via ORAL
  Filled 2012-01-05 (×3): qty 1

## 2012-01-05 MED ORDER — HYDRALAZINE HCL 20 MG/ML IJ SOLN
10.0000 mg | Freq: Four times a day (QID) | INTRAMUSCULAR | Status: DC | PRN
  Administered 2012-01-05: 10 mg via INTRAVENOUS
  Filled 2012-01-05: qty 0.5

## 2012-01-05 MED ORDER — OXYCODONE HCL 5 MG PO TABS
5.0000 mg | ORAL_TABLET | Freq: Four times a day (QID) | ORAL | Status: DC | PRN
  Administered 2012-01-06 – 2012-01-07 (×3): 5 mg via ORAL
  Filled 2012-01-05 (×4): qty 1

## 2012-01-05 MED ORDER — HYDRALAZINE HCL 25 MG PO TABS
25.0000 mg | ORAL_TABLET | Freq: Three times a day (TID) | ORAL | Status: DC
  Administered 2012-01-05 – 2012-01-06 (×3): 25 mg via ORAL
  Filled 2012-01-05 (×5): qty 1

## 2012-01-05 MED ORDER — MORPHINE SULFATE 2 MG/ML IJ SOLN
2.0000 mg | Freq: Once | INTRAMUSCULAR | Status: AC
  Administered 2012-01-05: 2 mg via INTRAVENOUS

## 2012-01-05 MED ORDER — OXYCODONE HCL 5 MG PO TABS: 5.0000 mg | ORAL_TABLET | ORAL | Status: DC | PRN

## 2012-01-05 MED ORDER — MORPHINE SULFATE 2 MG/ML IJ SOLN
1.0000 mg | INTRAMUSCULAR | Status: DC | PRN
  Administered 2012-01-05: 1 mg via INTRAVENOUS
  Filled 2012-01-05 (×2): qty 1

## 2012-01-05 MED ORDER — AMLODIPINE BESYLATE 10 MG PO TABS
10.0000 mg | ORAL_TABLET | Freq: Every day | ORAL | Status: DC
  Administered 2012-01-06 – 2012-01-07 (×2): 10 mg via ORAL
  Filled 2012-01-05 (×2): qty 1

## 2012-01-05 MED ORDER — PROMETHAZINE HCL 25 MG/ML IJ SOLN: 12.5000 mg | Freq: Four times a day (QID) | INTRAMUSCULAR | Status: DC | PRN

## 2012-01-05 NOTE — Progress Notes (Signed)
Pt crying out continuously for more pain meds and states he is still hurting. Pt given morpine 1mg  at 1000, Oxy IR 5mg  ay 0720 and pheneran 12.5mg  at 0850. It's too early for any of his PRN's, pt is insisting he needs more now. MD made aware and morphine increased to 2mg  Q4H first dose given at 1200 pt currently asleep. Will continue to monitor. Julien Nordmann [facility]

## 2012-01-05 NOTE — Progress Notes (Signed)
Late entry for 0850 Pt given oxy IR 5mg  at 0716 Pt states he vomited 20 minutes after getting meds and pain med is not working. Phenergan given at 0849 and MD paged. MD came to unit to see the pt, PRN meds adjusted. Ryan Winters [facility]

## 2012-01-05 NOTE — Plan of Care (Signed)
Problem: Phase I Progression Outcomes Goal: Initial discharge plan identified Outcome: Completed/Met Date Met:  01/05/12 Pt to return home with wife when medically

## 2012-01-05 NOTE — ED Provider Notes (Signed)
Medical screening examination/treatment/procedure(s) were conducted as a shared visit with non-physician practitioner(s) and myself.  I personally evaluated the patient during the encounter   Ryan Racer, MD 01/05/12 1250

## 2012-01-05 NOTE — Progress Notes (Signed)
Subjective: Patient seen and examined this morning. Still c/o epigastric pain and vomited this am  Objective:  Vital signs in last 24 hours:  Filed Vitals:   01/04/12 2037 01/05/12 0616  BP: 145/82 157/105  Pulse: 98 85  Temp: 98.6 F (37 C) 97.7 F (36.5 C)  TempSrc:  Oral  Resp: 20 20  Height: 5\' 6"  (1.676 m)   Weight: 68.1 kg (150 lb 2.1 oz)   SpO2: 100% 100%    Intake/Output from previous day:   Intake/Output Summary (Last 24 hours) at 01/05/12 1410 Last data filed at 01/05/12 0603  Gross per 24 hour  Intake 496.67 ml  Output      0 ml  Net 496.67 ml    Physical Exam:  General: middle aged male  in no acute distress. HEENT: no pallor, no icterus, moist oral mucosa, no JVD, no lymphadenopathy Heart: Normal  s1 &s2  Regular rate and rhythm, without murmurs, rubs, gallops. Lungs: Clear to auscultation bilaterally. Abdomen: Soft, epigastric tenderness to palpation, non distended, positive bowel sounds. Extremities: No clubbing cyanosis or edema with positive pedal pulses. Neuro: Alert, awake, oriented x3, nonfocal.   Lab Results:  Basic Metabolic Panel:    Component Value Date/Time   NA 135 01/05/2012 0625   K 3.7 01/05/2012 0625   CL 101 01/05/2012 0625   CO2 26 01/05/2012 0625   BUN 32* 01/05/2012 0625   CREATININE 2.88* 01/05/2012 0625   GLUCOSE 98 01/05/2012 0625   CALCIUM 9.1 01/05/2012 0625   CALCIUM PENDING 06/28/2011 1331   CALCIUM 9.3 06/28/2011 1331   CBC:    Component Value Date/Time   WBC 6.2 01/05/2012 0625   HGB 10.5* 01/05/2012 0625   HCT 31.2* 01/05/2012 0625   PLT 212 01/05/2012 0625   MCV 88.6 01/05/2012 0625   NEUTROABS 5.5 01/04/2012 2219   LYMPHSABS 1.1 01/04/2012 2219   MONOABS 0.8 01/04/2012 2219   EOSABS 0.0 01/04/2012 2219   BASOSABS 0.0 01/04/2012 2219    Recent Results (from the past 240 hour(s))  URINE CULTURE     Status: Normal   Collection Time   12/29/11 10:42 PM      Component Value Range Status Comment   Specimen Description  URINE, RANDOM   Final    Special Requests NONE   Final    Culture  Setup Time [id number]   Final    Colony Count NO GROWTH   Final    Culture NO GROWTH   Final    Report Status 12/31/2011 FINAL   Final     Studies/Results: Ct Abdomen Pelvis Wo Contrast  01/03/2012  *RADIOLOGY REPORT*  Clinical Data: Mid abdominal pain/tenderness, vomiting  CT ABDOMEN AND PELVIS WITHOUT CONTRAST  Technique:  Multidetector CT imaging of the abdomen and pelvis was performed following the standard protocol without intravenous contrast.  Comparison: [place] CT abdomen pelvis dated 10/02/2011  Findings: Motion degraded images.  Lung bases are essentially clear.  Cardiomegaly.  Mitral valve annuloplasty.  Unenhanced liver, spleen, pancreas, and adrenal glands are within normal limits.  Gallbladder is unremarkable.  Prior gallbladder wall thickening has improved.  No intrahepatic or extrahepatic ductal dilatation.  Kidneys are within normal limits.  No renal calculi or hydronephrosis.  No evidence of bowel obstruction.  Normal appendix.  No evidence of abdominal aortic aneurysm.  Small volume abdominopelvic ascites.  No suspicious abdominopelvic lymphadenopathy.  Prostate is within normal limits.  Bladder is underdistended.  Degenerative changes of the visualized thoracolumbar spine.  IMPRESSION:  Motion degraded images.  No evidence of bowel obstruction.  Normal appendix.  Small volume abdominopelvic ascites.  Prior gallbladder wall thickening has improved.  No CT findings to account for the patient's abdominal pain.  Original Report Authenticated By: Charline Bills, M.D.    Medications: Scheduled Meds:   . amLODipine  5 mg Oral Daily  . ARIPiprazole  5 mg Oral Daily  . aspirin EC  81 mg Oral Daily  . enoxaparin  30 mg Subcutaneous Q24H  . hydrALAZINE  12.5 mg Oral TID  . labetalol  200 mg Oral Daily  . metoCLOPramide (REGLAN) injection  10 mg Intravenous Q6H  .  morphine injection  2 mg Intravenous Once  .  mulitivitamin with minerals  1 tablet Oral Daily  . risperiDONE  3 mg Oral QHS  . traZODone  50 mg Oral QHS  . vitamin C  500 mg Oral Daily  . DISCONTD: furosemide  80 mg Oral BID   Continuous Infusions:   . sodium chloride 100 mL/hr at 01/05/12 0603   PRN Meds:.alum & mag hydroxide-simeth, docusate sodium, morphine injection, oxyCODONE, promethazine, promethazine, DISCONTD:  morphine injection, DISCONTD: oxyCODONE, DISCONTD: oxyCODONE, DISCONTD: promethazine, DISCONTD: promethazine  Assessment 51 yo Male  with h/o severe MR s/p MV repair/annuloplasty with known residual valvular  leakage, HTN, schizophrenia, CKD, diastolic CHF,  chronic anemia, polysubstance abuse presents with epigastric pain with nausea and vomiting. This is his 3rd admission in past 4 months for similar complains.   Plan:  *Nausea and vomiting with epigastric pain No clear cause but has hade recurrent admission since November 2012 with similar symptoms of epigastric pain.patient was seen by Dr Leone Payor in nov 2012 when he was admitted for rectal bleed and planned for outpatient colonoscopy.   will cont pain control and antiemetics for now Hb at baseline Start on clears On  IV fluids which will be continued CT abd and lipase wnl Will ask  GI to reassess  if not improved by tomorrow. Has appt with GI in 1 month   Anemia in chronic kidney disease Stable at baseline   Acute on chronic diastolic heart failure euvolemic currently   will resume lasix at 40 mg daily Cont ASA and labetalol   HTN Cont home meds  CKD : stable   DVT prophylaxis     LOS: 1 day   Sam Wunschel 01/05/2012, 2:10 PM

## 2012-01-06 LAB — GLUCOSE, CAPILLARY: Glucose-Capillary: 107 mg/dL — ABNORMAL HIGH (ref 70–99)

## 2012-01-06 MED ORDER — HYDRALAZINE HCL 50 MG PO TABS
50.0000 mg | ORAL_TABLET | Freq: Three times a day (TID) | ORAL | Status: DC
  Administered 2012-01-06 – 2012-01-07 (×4): 50 mg via ORAL
  Filled 2012-01-06 (×6): qty 1

## 2012-01-06 NOTE — Progress Notes (Signed)
Subjective: Patient seen and examined this am. informs his abdominal pain to be better. Denies vomiting.   Objective:  Vital signs in last 24 hours:  Filed Vitals:   01/05/12 1625 01/05/12 2202 01/06/12 0500 01/06/12 0924  BP: 139/85 149/87 133/81 145/95  Pulse:  95 94   Temp:  97.9 F (36.6 C) 97.6 F (36.4 C)   TempSrc:  Oral Oral   Resp:  19 20   Height:      Weight:   68.4 kg (150 lb 12.7 oz)   SpO2:  99% 99%     Intake/Output from previous day:   Intake/Output Summary (Last 24 hours) at 01/06/12 1425 Last data filed at 01/06/12 1346  Gross per 24 hour  Intake 1575.67 ml  Output    425 ml  Net 1150.67 ml    Physical Exam:  General: middle aged male in no acute distress.  HEENT: no pallor, no icterus, moist oral mucosa, no JVD, no lymphadenopathy  Heart: Normal s1 &s2 Regular rate and rhythm, without murmurs, rubs, gallops.  Lungs: Clear to auscultation bilaterally.  Abdomen: Soft, non tender, non distended, positive bowel sounds.  Extremities: No clubbing cyanosis or edema with positive pedal pulses.  Neuro: Alert, awake, oriented x3, nonfocal.    Lab Results:  Basic Metabolic Panel:    Component Value Date/Time   NA 135 01/05/2012 0625   K 3.7 01/05/2012 0625   CL 101 01/05/2012 0625   CO2 26 01/05/2012 0625   BUN 32* 01/05/2012 0625   CREATININE 2.88* 01/05/2012 0625   GLUCOSE 98 01/05/2012 0625   CALCIUM 9.1 01/05/2012 0625   CALCIUM PENDING 06/28/2011 1331   CALCIUM 9.3 06/28/2011 1331   CBC:    Component Value Date/Time   WBC 6.2 01/05/2012 0625   HGB 10.5* 01/05/2012 0625   HCT 31.2* 01/05/2012 0625   PLT 212 01/05/2012 0625   MCV 88.6 01/05/2012 0625   NEUTROABS 5.5 01/04/2012 2219   LYMPHSABS 1.1 01/04/2012 2219   MONOABS 0.8 01/04/2012 2219   EOSABS 0.0 01/04/2012 2219   BASOSABS 0.0 01/04/2012 2219    Recent Results (from the past 240 hour(s))  URINE CULTURE     Status: Normal   Collection Time   12/29/11 10:42 PM      Component Value Range Status  Comment   Specimen Description URINE, RANDOM   Final    Special Requests NONE   Final    Culture  Setup Time [id number]   Final    Colony Count NO GROWTH   Final    Culture NO GROWTH   Final    Report Status 12/31/2011 FINAL   Final     Studies/Results: No results found.  Medications: Scheduled Meds:   . amLODipine  10 mg Oral Daily  . ARIPiprazole  5 mg Oral Daily  . aspirin EC  81 mg Oral Daily  . enoxaparin  30 mg Subcutaneous Q24H  . furosemide  40 mg Oral Daily  . hydrALAZINE  25 mg Oral TID  . labetalol  200 mg Oral Daily  . metoCLOPramide (REGLAN) injection  10 mg Intravenous Q6H  . mulitivitamin with minerals  1 tablet Oral Daily  . risperiDONE  3 mg Oral QHS  . traZODone  50 mg Oral QHS  . vitamin C  500 mg Oral Daily  . DISCONTD: amLODipine  5 mg Oral Daily  . DISCONTD: hydrALAZINE  12.5 mg Oral TID   Continuous Infusions:   . sodium chloride 75 mL/hr  at 01/06/12 1200   PRN Meds:.alum & mag hydroxide-simeth, docusate sodium, hydrALAZINE, morphine injection, oxyCODONE, promethazine, promethazine Assessment  51 yo Male with h/o severe MR s/p MV repair/annuloplasty with known residual valvular  leakage, HTN, schizophrenia, CKD, diastolic CHF, chronic anemia, polysubstance abuse presents with epigastric pain with nausea and vomiting. This is his 3rd admission in past 4 months for similar complains.   Plan:  *Nausea and vomiting with epigastric pain  No clear cause but has hade recurrent admission since November 2012 with similar symptoms of epigastric pain.patient was seen by Dr Leone Payor in nov 2012 when he was admitted for rectal bleed and planned for outpatient colonoscopy.  will cont pain control and antiemetics for now . improving Hb at baseline  D/c fluids CT abd and lipase wnl  Has appt with GI in 1 month , will ask GI to see pt tomorrow if w/up can be done as inpt  Anemia in chronic kidney disease  Stable at baseline   Acute on chronic diastolic heart  failure  euvolemic currently  Cont lasix at 40 mg daily  Cont ASA and labetalol   HTN  Uncontrolled . Cont home meds , increase hydralazine to 50 tid  CKD : stable   DVT prophylaxis       LOS: 2 days   Eugenia Eldredge 01/06/2012, 2:25 PM

## 2012-01-06 NOTE — Plan of Care (Signed)
Problem: Phase II Progression Outcomes Goal: Progress activity as tolerated unless otherwise ordered Outcome: Completed/Met Date Met:  01/06/12 OOB to chair and ambulating in halls  Goal: Discharge plan established Outcome: Completed/Met Date Met:  01/06/12 Pt to return home with wife when medically cleared Goal: Vital signs remain stable Outcome: Completed/Met Date Met:  01/06/12 BP controlled

## 2012-01-07 LAB — GLUCOSE, CAPILLARY: Glucose-Capillary: 105 mg/dL — ABNORMAL HIGH (ref 70–99)

## 2012-01-07 MED ORDER — HYDRALAZINE HCL 25 MG PO TABS: 50.0000 mg | ORAL_TABLET | Freq: Three times a day (TID) | ORAL | Status: DC

## 2012-01-07 MED ORDER — POLYETHYLENE GLYCOL 3350 17 GM/SCOOP PO POWD: 17.0000 g | Freq: Every day | ORAL | Status: AC

## 2012-01-07 MED ORDER — AMLODIPINE BESYLATE 5 MG PO TABS: 5.0000 mg | ORAL_TABLET | Freq: Every day | ORAL | Status: DC

## 2012-01-07 MED ORDER — FUROSEMIDE 80 MG PO TABS: 40.0000 mg | ORAL_TABLET | Freq: Every day | ORAL | Status: DC

## 2012-01-07 MED ORDER — AMLODIPINE BESYLATE 5 MG PO TABS: 10.0000 mg | ORAL_TABLET | Freq: Every day | ORAL | Status: DC

## 2012-01-07 MED ORDER — OXYCODONE HCL 5 MG PO TABS
5.0000 mg | ORAL_TABLET | Freq: Once | ORAL | Status: AC
  Administered 2012-01-07: 5 mg via ORAL

## 2012-01-07 NOTE — Progress Notes (Signed)
[facility] discharged Home per MD order.  Discharge instructions reviewed and discussed with the patient, all questions and concerns answered. Copy of instructions and scripts given to patient.   Cloys, [facility] Medication Instructions AVW:[id number]   Printed on:01/07/12 1446  Medication Information                    ARIPiprazole (ABILIFY) 5 MG tablet Take 5 mg by mouth daily.             aspirin 81 MG EC tablet Take 81 mg by mouth daily.             labetalol (NORMODYNE) 200 MG tablet Take 200 mg by mouth daily.             risperiDONE (RISPERDAL) 3 MG tablet Take 3 mg by mouth at bedtime.             traZODone (DESYREL) 50 MG tablet Take 50 mg by mouth at bedtime. For sleep            vitamin C (ASCORBIC ACID) 500 MG tablet Take 1 tablet (500 mg total) by mouth daily.           Multiple Vitamin (MULTIVITAMIN) tablet Take 1 tablet by mouth daily.           fish oil-omega-3 fatty acids 1000 MG capsule Take 2 capsules (2 g total) by mouth daily.           metoCLOPramide (REGLAN) 10 MG tablet Take 1 tablet (10 mg total) by mouth every 6 (six) hours.           promethazine (PHENERGAN) 25 MG suppository Place 1 suppository (25 mg total) rectally every 6 (six) hours as needed for nausea.           docusate sodium (COLACE) 100 MG capsule Take 100 mg by mouth daily as needed. For constipation           oxyCODONE (OXY IR/ROXICODONE) 5 MG immediate release tablet Take 5 mg by mouth every 8 (eight) hours as needed. For pain           hydrALAZINE (APRESOLINE) 25 MG tablet Take 2 tablets (50 mg total) by mouth 3 (three) times daily.           furosemide (LASIX) 80 MG tablet Take 0.5 tablets (40 mg total) by mouth daily.           amLODipine (NORVASC) 5 MG tablet Take 1 tablet (5 mg total) by mouth daily.           polyethylene glycol powder (MIRALAX) powder Take 17 g by mouth daily.             Patients skin is clean, dry and intact, no evidence of skin break  down. IV site discontinued and catheter remains intact. Site without signs and symptoms of complications. Dressing and pressure applied.  Patient escorted to car by NT in a wheelchair,  no distress noted upon discharge.  Julien Nordmann [facility] 01/07/2012 2:46 PM

## 2012-01-07 NOTE — Discharge Summary (Signed)
Patient seen and examined this am.  Agree with plan outlined above  patient stable for d/c home with outpt GI follow up

## 2012-01-07 NOTE — Progress Notes (Signed)
Dr Gonzella Lex asked [place] to talk to the pt re possibility of doing inpt colonoscopy.  Pt has hx of rectal bleeding and pain associated with constipation.  He was seen as inpt consult for rectal bleeding in November 2012.  Has no-showed and/or cancelled outpt colonoscopies with propofol sedation with Dr Leone Payor and Nurse visits several times   He has upcoming appt with [facility] GI previsit nurse on March 14th.  This was arranged by his primary MD.  He needs to keep this upcoming appt.  He is not scheduled to see Dr Leone Payor ([facility]) in future.  Jennye Moccasin, PA-C

## 2012-01-07 NOTE — Discharge Summary (Signed)
Physician Discharge Summary  Patient ID: Ryan Winters MRN: [id number] DOB/AGE: Sep 16, 1961 50 y.o.  Admit date: 01/04/2012 Discharge date: 01/07/2012    Discharge Diagnoses:  1. Nausea, vomiting with epigastric pain 2. Constipation 3. Hypertension, accelerated 4. Anemia, chronic 5. CKD 6. Chronic diastolic HF 7. Bipolar disorder 8. Schizophrenia 9. Hx MVP s/p repair 11/11    Brief Summary: Ryan Winters is a 51 y.o. y/o male with history of gastroparesis, diastolic CHF (on lasix), HTN, depression presented to [facility] from PCP office secondary to intractable nausea and vomiting started 1 week prior to admission. As per patient he has been to [facility] ED 3 times over last week due to same symptoms but was always discharged home as his symptoms would slightly improve. On February 14th, 2013 he has had CT abd/pelvis with no significant findings. He complains of associated abdominal pain around epigastric area, 5-6/10 in intensity, non radiating and no alleviating factors. Patient denies feeling lightheaded or dizzy and reports no loss of consciousness. No complains of shortness of breath, chest pain, or fever or chills. Given persistent nature of patient's symptoms and frequent hospital visits, [facility] was asked to admit.    Hospital Course by Discharge Summary  1. Nausea, vomiting with epigastric pain: unclear etiology but suspect related to gastroparesis in setting of pain medications and with constipation. Previous inpatient workup has included abdominal CT's in 09/2011 and this admission. Both scans failed to find a source of patient's pain. Plain film of the abdomen done 2/13 did show constipation. Pt's symptoms have now resolved with  Supportive care including IVFs, antiemetics and diet as tolerated. GI was asked to evaluate given recurrent admissions for same. Pt was seen by GI during hospitalization in 09/2011, however has failed to keep outpt appointments with  [facility] for colonoscopies. At this point, pt is stable to follow-up with [facility] (scheduled prior to admit by PCP) on 3/14. No further inpt workup is planned. Pt has been instructed to continue medications as prior to admission adding daily Miralax for constipation.  2. Hypertension, accelerated: Patient's BP remained slightly elevated throughout hospitalization. A component of this is likely due to patient's pain as it has improved with better pain control. His hydralazine dose has been increased as listed below. Patient is to follow-up with PCP as instructed.  Other chronic medical problems remained stable throughout hospitalization.    Consults: GI   Significant Diagnostic Studies:  CHEST - 2 VIEW  Comparison: 10/30/2011  Findings: Postoperative changes in the mediastinum with sternotomy  wires and cardiac valve prosthesis. Mild cardiac enlargement with  normal pulmonary vascularity. No focal airspace consolidation in  the lungs. No blunting of costophrenic angles. No pneumothorax.  Slight fibrosis in the right lung base. Vague nodular opacities  over the mid lungs probably represent prominent nipple shadows. No  significant change since previous study.  IMPRESSION:  Mild cardiac enlargement. No pulmonary vascular congestion or  edema. No focal consolidation.  ABDOMEN - 2 VIEW  Comparison: 12/29/2011.  Findings: A fair amount of stool is seen in the colon. Gas is seen  in the rectosigmoid colon. No definite small bowel dilatation. No  free air. Visualized lung bases are clear.  IMPRESSION:  Bowel gas pattern is suggestive of constipation  CT ABDOMEN AND PELVIS WITHOUT CONTRAST  Technique: Multidetector CT imaging of the abdomen and pelvis was  performed following the standard protocol without intravenous  contrast.  Comparison: [place] CT abdomen pelvis dated 10/02/2011  Findings: Motion degraded  images.  Lung bases are essentially clear.  Cardiomegaly. Mitral valve  annuloplasty.  Unenhanced liver, spleen, pancreas, and adrenal glands are within  normal limits.  Gallbladder is unremarkable. Prior gallbladder wall thickening has  improved. No intrahepatic or extrahepatic ductal dilatation.  Kidneys are within normal limits. No renal calculi or  hydronephrosis.  No evidence of bowel obstruction. Normal appendix.  No evidence of abdominal aortic aneurysm.  Small volume abdominopelvic ascites.  No suspicious abdominopelvic lymphadenopathy.  Prostate is within normal limits.  Bladder is underdistended.  Degenerative changes of the visualized thoracolumbar spine.  IMPRESSION:  Motion degraded images.  No evidence of bowel obstruction. Normal appendix.  Small volume abdominopelvic ascites.  Prior gallbladder wall thickening has improved.  No CT findings to account for the patient's abdominal pain.  Original Report Authenticated By: Charline Bills, M.D.    Discharge Exam: General: middle aged male in no acute distress.  HEENT: no pallor, no icterus, moist oral mucosa, no JVD, no lymphadenopathy  Heart: Normal s1 &s2 Regular rate and rhythm, without murmurs, rubs, gallops.  Lungs: Clear to auscultation bilaterally.  Abdomen: Soft, non tender, non distended, positive bowel sounds.  Extremities: No clubbing cyanosis or edema with positive pedal pulses.  Neuro: Alert, awake, oriented x3, nonfocal.    Discharge Labs  BMET  Lab 01/05/12 0625 01/04/12 2219 01/03/12 1515 01/02/12 0600  NA 135 134* 135 132*  K 3.7 3.6 -- --  CL 101 99 98 98  CO2 26 26 26 25   GLUCOSE 98 107* 107* 93  BUN 32* 35* 30* 27*  CREATININE 2.88* 3.09* 3.22* 3.23*  CALCIUM 9.1 9.4 9.6 9.0  MG -- 2.1 -- --  PHOS -- 3.5 -- --     CBC   Lab 01/05/12 0625 01/04/12 2219 01/03/12 1515  HGB 10.5* 10.8* 11.4*  HCT 31.2* 32.4* 34.4*  WBC 6.2 7.4 5.3  PLT 212 175 179   Anti-Coagulation  Lab 01/04/12 2219 01/02/12 0600  INR 1.16 1.14       Follow-up  Information    Follow up with Florencia Reasons, MD on 01/31/2012. (3:30 PM  This is an appointment with nurse at the [facility] office.   Not sure which doctor  will see the patient. then.)    Contact information:   [address], [address] [employer], [place]. [place] [address] [phone]       Follow up with OSEI-BONSU,GEORGE, MD. Call in 2 weeks.   Contact information:   [address] [place] [address] [phone]            Garner, [place]  Home Medication Instructions [id number]   Printed on:01/07/12 1358  Medication Information                    ARIPiprazole (ABILIFY) 5 MG tablet Take 5 mg by mouth daily.             aspirin 81 MG EC tablet Take 81 mg by mouth daily.             labetalol (NORMODYNE) 200 MG tablet Take 200 mg by mouth daily.             risperiDONE (RISPERDAL) 3 MG tablet Take 3 mg by mouth at bedtime.             traZODone (DESYREL) 50 MG tablet Take 50 mg by mouth at bedtime. For sleep  vitamin C (ASCORBIC ACID) 500 MG tablet Take 1 tablet (500 mg total) by mouth daily.           Multiple Vitamin (MULTIVITAMIN) tablet Take 1 tablet by mouth daily.           fish oil-omega-3 fatty acids 1000 MG capsule Take 2 capsules (2 g total) by mouth daily.           metoCLOPramide (REGLAN) 10 MG tablet Take 1 tablet (10 mg total) by mouth every 6 (six) hours.           promethazine (PHENERGAN) 25 MG suppository Place 1 suppository (25 mg total) rectally every 6 (six) hours as needed for nausea.           docusate sodium (COLACE) 100 MG capsule Take 100 mg by mouth daily as needed. For constipation           oxyCODONE (OXY IR/ROXICODONE) 5 MG immediate release tablet Take 5 mg by mouth every 8 (eight) hours as needed. For pain           hydrALAZINE (APRESOLINE) 25 MG tablet Take 2 tablets (50 mg total) by mouth 3 (three) times daily.             furosemide (LASIX) 80 MG tablet Take 0.5 tablets (40 mg total) by mouth daily.           amLODipine (NORVASC) 5 MG tablet Take 1 tablet (5 mg total) by mouth daily.            Diet: low-residue with small, frequent meals  Disposition: Home or Self Care  Discharged Condition: Ryan Winters has met maximum benefit of inpatient care and is medically stable and cleared for discharge.  Patient is pending follow up as above.      Time spent on disposition:  Greater than 35 minutes.   Cordelia Pen, NP-C [facility]  pgr [phone]

## 2012-01-07 NOTE — Progress Notes (Signed)
   CARE MANAGEMENT NOTE 01/07/2012  Patient:  KOTARO, BUER   Account Number:  [id number]  Date Initiated:  01/07/2012  Documentation initiated by:  Letha Cape  Subjective/Objective Assessment:   dx vomiting  admit- lives with spouse.     Action/Plan:   continue progression of care.   Anticipated DC Date:  12/31/2011   Anticipated DC Plan:  HOME W HOME HEALTH SERVICES      DC Planning Services  CM consult      [facility] Choice  HOME HEALTH  Resumption Of Svcs/PTA Provider   Choice offered to / List presented to:          [facility] arranged  HH-10 DISEASE MANAGEMENT      HH agency  [facility]   Status of service:  Completed, signed off Medicare Important Message given?   (If response is "NO", the following Medicare IM given date fields will be blank) Date Medicare IM given:   Date Additional Medicare IM given:    Discharge Disposition:  HOME W HOME HEALTH SERVICES  Per UR Regulation:    Comments:  01/07/12 16:03 Letha Cape RN, BSN [phone] pt dc to home today, patient active with [facility] for [facility] for CHF, will resume with [facility].

## 2012-01-24 ENCOUNTER — Encounter (HOSPITAL_COMMUNITY): Payer: Self-pay | Admitting: Emergency Medicine

## 2012-01-24 ENCOUNTER — Emergency Department (HOSPITAL_COMMUNITY): Payer: Medicaid Other

## 2012-01-24 ENCOUNTER — Emergency Department (HOSPITAL_COMMUNITY)
Admission: EM | Admit: 2012-01-24 | Discharge: 2012-01-25 | Disposition: A | Discharge status: Home / Self Care | Payer: Medicaid Other | Attending: Emergency Medicine | Admitting: Emergency Medicine

## 2012-01-24 DIAGNOSIS — E785 Hyperlipidemia, unspecified: Secondary | ICD-10-CM | POA: Insufficient documentation

## 2012-01-24 DIAGNOSIS — F319 Bipolar disorder, unspecified: Secondary | ICD-10-CM | POA: Insufficient documentation

## 2012-01-24 DIAGNOSIS — K439 Ventral hernia without obstruction or gangrene: Secondary | ICD-10-CM | POA: Insufficient documentation

## 2012-01-24 DIAGNOSIS — I509 Heart failure, unspecified: Secondary | ICD-10-CM | POA: Insufficient documentation

## 2012-01-24 DIAGNOSIS — R300 Dysuria: Secondary | ICD-10-CM | POA: Insufficient documentation

## 2012-01-24 DIAGNOSIS — R1084 Generalized abdominal pain: Secondary | ICD-10-CM

## 2012-01-24 DIAGNOSIS — N189 Chronic kidney disease, unspecified: Secondary | ICD-10-CM | POA: Insufficient documentation

## 2012-01-24 DIAGNOSIS — F209 Schizophrenia, unspecified: Secondary | ICD-10-CM | POA: Insufficient documentation

## 2012-01-24 DIAGNOSIS — I129 Hypertensive chronic kidney disease with stage 1 through stage 4 chronic kidney disease, or unspecified chronic kidney disease: Secondary | ICD-10-CM | POA: Insufficient documentation

## 2012-01-24 DIAGNOSIS — R110 Nausea: Secondary | ICD-10-CM | POA: Insufficient documentation

## 2012-01-24 DIAGNOSIS — F17200 Nicotine dependence, unspecified, uncomplicated: Secondary | ICD-10-CM | POA: Insufficient documentation

## 2012-01-24 DIAGNOSIS — R109 Unspecified abdominal pain: Secondary | ICD-10-CM | POA: Insufficient documentation

## 2012-01-24 DIAGNOSIS — F1910 Other psychoactive substance abuse, uncomplicated: Secondary | ICD-10-CM | POA: Insufficient documentation

## 2012-01-24 LAB — URINE MICROSCOPIC-ADD ON

## 2012-01-24 LAB — CBC
HCT: 31.4 % — ABNORMAL LOW (ref 39.0–52.0)
Hemoglobin: 10 g/dL — ABNORMAL LOW (ref 13.0–17.0)
MCV: 91.5 fL (ref 78.0–100.0)
RBC: 3.43 MIL/uL — ABNORMAL LOW (ref 4.22–5.81)
RDW: 22.9 % — ABNORMAL HIGH (ref 11.5–15.5)
WBC: 7.1 10*3/uL (ref 4.0–10.5)

## 2012-01-24 LAB — DIFFERENTIAL
Basophils Relative: 1 % (ref 0–1)
Eosinophils Relative: 2 % (ref 0–5)
Lymphocytes Relative: 14 % (ref 12–46)
Lymphs Abs: 1 10*3/uL (ref 0.7–4.0)
Monocytes Relative: 9 % (ref 3–12)
Neutro Abs: 5.3 10*3/uL (ref 1.7–7.7)

## 2012-01-24 LAB — URINALYSIS, ROUTINE W REFLEX MICROSCOPIC
Ketones, ur: NEGATIVE mg/dL
Leukocytes, UA: NEGATIVE
Nitrite: NEGATIVE
Urobilinogen, UA: 0.2 mg/dL (ref 0.0–1.0)
pH: 6 (ref 5.0–8.0)

## 2012-01-24 LAB — RAPID URINE DRUG SCREEN, HOSP PERFORMED
Barbiturates: NOT DETECTED
Benzodiazepines: NOT DETECTED

## 2012-01-24 LAB — POCT I-STAT, CHEM 8
BUN: 48 mg/dL — ABNORMAL HIGH (ref 6–23)
Chloride: 110 mEq/L (ref 96–112)
Creatinine, Ser: 3.2 mg/dL — ABNORMAL HIGH (ref 0.50–1.35)
Glucose, Bld: 112 mg/dL — ABNORMAL HIGH (ref 70–99)
Hemoglobin: 9.9 g/dL — ABNORMAL LOW (ref 13.0–17.0)
Potassium: 4.6 mEq/L (ref 3.5–5.1)
Sodium: 138 mEq/L (ref 135–145)

## 2012-01-24 MED ORDER — HYDROMORPHONE HCL PF 1 MG/ML IJ SOLN
1.0000 mg | Freq: Once | INTRAMUSCULAR | Status: AC
  Administered 2012-01-24: 1 mg via INTRAVENOUS
  Filled 2012-01-24: qty 1

## 2012-01-24 MED ORDER — ONDANSETRON HCL 4 MG/2ML IJ SOLN
INTRAMUSCULAR | Status: AC
  Administered 2012-01-24: 4 mg
  Filled 2012-01-24: qty 2

## 2012-01-24 NOTE — ED Notes (Signed)
Per EMS pt is c/o abd pain with nausea and vomiting for the past week  Pt states he was seen by his dr and given something for nausea but is unable to keep it down  Pt unsure what he was diagnosed with  Pt states he had a knot in his abd near his umbilicus  Pt states with vomiting he had some pain in his chest  Pt given zofran 4mg  IV in route  Pt has a #20 right forearm by EMS

## 2012-01-24 NOTE — ED Provider Notes (Signed)
History     CSN: [id number]  Arrival date & time 01/24/12  2134   First MD Initiated Contact with Patient 01/24/12 2150      Chief Complaint  Patient presents with  . Abdominal Pain    (Consider location/radiation/quality/duration/timing/severity/associated sxs/prior treatment) HPI Comments: Mr. Collymore presents today to the emergency room with continued abdominal pain, which is a chronic problem for, him. It is  associated with nausea, no vomiting.  He does carry diagnosis of gastroparesis.  He also has end-stage renal disease Not  on dialysis as of yet.  He sees Dr. Julio Sicks, so, who prescribes his OxyContin and oxycodone for him, which he states he is out of, but is not taking more than what is prescribed.  He also is expressing concern about a small  "knot" just above his umbilicus which is been there for a month and it gets bigger at times and sometimes is flat  Patient is a 51 y.o. male presenting with abdominal pain.  Abdominal Pain The primary symptoms of the illness include abdominal pain, nausea and dysuria. The primary symptoms of the illness do not include fever, shortness of breath or diarrhea. The onset of the illness was gradual. The problem has been gradually worsening.  The abdominal pain began more than 2 days ago. The pain came on gradually. The abdominal pain has been gradually worsening since its onset. The abdominal pain is located in the periumbilical region. The abdominal pain does not radiate. The severity of the abdominal pain is 10/10. The abdominal pain is relieved by nothing.  Symptoms associated with the illness do not include chills, anorexia or constipation. Significant associated medical issues include substance abuse.    Past Medical History  Diagnosis Date  . Hypertension   . Bipolar disorder   . Schizophrenia     h/o hospitalized inpt therapy @ the [facility]  . MVP (mitral valve prolapse)     with severe MR s/p repair with  annuloplasty and cleft MV repair in 09/2010  . Hyperlipidemia   . Chronic kidney disease   . H/O: suicide attempt 1998    was hospitalized at [facility]  . Anemia     Hx of iron infusions  . CHF (congestive heart failure)     Hx of Class IV CHF    Past Surgical History  Procedure Date  . Mitral valve repair 2011  . Av fistula placement, radiocephalic 05/24/2011    Left arm  . Leg surgery     Family History  Problem Relation Age of Onset  . Hypertension Mother     History  Substance Use Topics  . Smoking status: Current Some Day Smoker -- 30 years    Types: Cigarettes  . Smokeless tobacco: Never Used  . Alcohol Use: No     Quit alcohol in July of 2011      Review of Systems  Constitutional: Negative for fever and chills.  Respiratory: Negative for shortness of breath.   Gastrointestinal: Positive for nausea and abdominal pain. Negative for diarrhea, constipation and anorexia.  Genitourinary: Positive for dysuria.  Neurological: Negative for dizziness and weakness.    Allergies  Food  Home Medications   Current Outpatient Rx  Name Route Sig Dispense Refill  . AMLODIPINE BESYLATE 5 MG PO TABS Oral Take 1 tablet (5 mg total) by mouth daily. 30 tablet 0  . ARIPIPRAZOLE 5 MG PO TABS Oral Take 5 mg by mouth daily.      . ASPIRIN  81 MG PO TBEC Oral Take 81 mg by mouth daily.      Marland Kitchen DOCUSATE SODIUM 100 MG PO CAPS Oral Take 100 mg by mouth daily as needed. For constipation    . OMEGA-3 FATTY ACIDS 1000 MG PO CAPS Oral Take 2 capsules (2 g total) by mouth daily. 30 capsule 0  . FUROSEMIDE 80 MG PO TABS Oral Take 0.5 tablets (40 mg total) by mouth daily. 30 tablet 0  . HYDRALAZINE HCL 25 MG PO TABS Oral Take 2 tablets (50 mg total) by mouth 3 (three) times daily. 30 tablet 0  . LABETALOL HCL 200 MG PO TABS Oral Take 200 mg by mouth daily.      Marland Kitchen ONE-DAILY MULTI VITAMINS PO TABS Oral Take 1 tablet by mouth daily. 30 tablet 0  . OXYCODONE HCL 5 MG PO TABS Oral Take 5 mg  by mouth every 8 (eight) hours as needed. For pain    . RISPERIDONE 3 MG PO TABS Oral Take 3 mg by mouth at bedtime.      . TRAZODONE HCL 50 MG PO TABS Oral Take 50 mg by mouth at bedtime. For sleep     . VITAMIN C 500 MG PO TABS Oral Take 1 tablet (500 mg total) by mouth daily. 30 tablet 0  . OXYCODONE HCL 5 MG PO TABS Oral Take 1 tablet (5 mg total) by mouth every 6 (six) hours as needed for pain. 20 tablet 0    BP 171/121  Pulse 93  Temp(Src) 97.6 F (36.4 C) (Oral)  Resp 24  SpO2 100%  Physical Exam  Constitutional: He is oriented to person, place, and time. He appears well-developed and well-nourished.  HENT:  Head: Normocephalic.  Eyes: Pupils are equal, round, and reactive to light.  Neck: Normal range of motion.  Cardiovascular: Normal rate.   Pulmonary/Chest: Effort normal.  Abdominal: Bowel sounds are normal.       Small 2 cm central ventral hernia, easily reduced.  Pateint is quite uncooperative during the exam refusing to lie down  Neurological: He is alert and oriented to person, place, and time.  Skin: Skin is warm.    ED Course  Procedures (including critical care time)  Labs Reviewed  URINALYSIS, ROUTINE W REFLEX MICROSCOPIC - Abnormal; Notable for the following:    Color, Urine AMBER (*) BIOCHEMICALS MAY BE AFFECTED BY COLOR   Hgb urine dipstick LARGE (*)    Protein, ur 100 (*)    All other components within normal limits  URINE RAPID DRUG SCREEN (HOSP PERFORMED) - Abnormal; Notable for the following:    Opiates POSITIVE (*)    Cocaine POSITIVE (*)    All other components within normal limits  CBC - Abnormal; Notable for the following:    RBC 3.43 (*)    Hemoglobin 10.0 (*)    HCT 31.4 (*)    RDW 22.9 (*)    All other components within normal limits  POCT I-STAT, CHEM 8 - Abnormal; Notable for the following:    BUN 48 (*)    Creatinine, Ser 3.20 (*)    Glucose, Bld 112 (*)    Calcium, Ion 1.10 (*)    Hemoglobin 9.9 (*)    HCT 29.0 (*)    All  other components within normal limits  URINE MICROSCOPIC-ADD ON - Abnormal; Notable for the following:    Casts RED CELL CAST (*) HYALINE CASTS   All other components within normal limits  DIFFERENTIAL  LAB REPORT -  SCANNED   Dg Abd Acute W/chest  01/24/2012  *RADIOLOGY REPORT*  Clinical Data: Abdominal pain, nausea, vomiting, diarrhea, constipation, ventral hernia, question bowel obstruction  ACUTE ABDOMEN SERIES (ABDOMEN 2 VIEW & CHEST 1 VIEW)  Comparison: 01/02/2012  Findings: Enlargement of cardiac silhouette post median sternotomy and MVR. Slight pulmonary vascular congestion. Newly porch thoracic aorta. Lungs clear. No pleural effusion or pneumothorax. Mild chronic peribronchial thickening noted. No paucity of bowel gas. No bowel dilatation, bowel wall thickening or free intraperitoneal air. Bones unremarkable. No urinary tract calcification.  IMPRESSION: No acute findings. Enlargement of cardiac silhouette with pulmonary vascular congestion post MVR. Minimal chronic bronchitic changes.  Original Report Authenticated By: Lollie Marrow, M.D.     1. Abdominal pain, diffuse   2. Ventral hernia   3. Substance abuse       MDM   today we'll obtain CBC i-STAT urine and acute abdomen, not feel it is necessary to obtain another CT scan as he had a normal scan October 14        Arman Filter, NP 01/25/12 1957  Arman Filter, NP 01/25/12 [phone]

## 2012-01-24 NOTE — ED Notes (Signed)
ZOX:[id number]<AV> Expected date:01/24/12<BR> Expected time: 9:31 PM<BR> Means of arrival:Ambulance<BR> Comments:<BR> M212. 51 yo m. abd pain, vomiting x 1 wk. 10 mins

## 2012-01-25 MED ORDER — OXYCODONE HCL 5 MG PO TABS: 5.0000 mg | ORAL_TABLET | Freq: Four times a day (QID) | ORAL | Status: AC | PRN

## 2012-01-25 NOTE — Discharge Instructions (Signed)
Abdominal Pain (Nonspecific) Your exam might not show the exact reason you have abdominal pain. Since there are many different causes of abdominal pain, another checkup and more tests may be needed. It is very important to follow up for lasting (persistent) or worsening symptoms. A possible cause of abdominal pain in any person who still has his or her appendix is acute appendicitis. Appendicitis is often hard to diagnose. Normal blood tests, urine tests, ultrasound, and CT scans do not completely rule out early appendicitis or other causes of abdominal pain. Sometimes, only the changes that happen over time will allow appendicitis and other causes of abdominal pain to be determined. Other potential problems that may require surgery may also take time to become more apparent. Because of this, it is important that you follow all of the instructions below. HOME CARE INSTRUCTIONS   Rest as much as possible.   Do not eat solid food until your pain is gone.   While adults or children have pain: A diet of water, weak decaffeinated tea, broth or bouillon, gelatin, oral rehydration solutions (ORS), frozen ice pops, or ice chips may be helpful.   When pain is gone in adults or children: Start a light diet (dry toast, crackers, applesauce, or white rice). Increase the diet slowly as long as it does not bother you. Eat no dairy products (including cheese and eggs) and no spicy, fatty, fried, or high-fiber foods.   Use no alcohol, caffeine, or cigarettes.   Take your regular medicines unless your caregiver told you not to.   Take any prescribed medicine as directed.   Only take over-the-counter or prescription medicines for pain, discomfort, or fever as directed by your caregiver. Do not give aspirin to children.  If your caregiver has given you a follow-up appointment, it is very important to keep that appointment. Not keeping the appointment could result in a permanent injury and/or lasting (chronic) pain  and/or disability. If there is any problem keeping the appointment, you must call to reschedule.  SEEK IMMEDIATE MEDICAL CARE IF:   Your pain is not gone in 24 hours.   Your pain becomes worse, changes location, or feels different.   You or your child has an oral temperature above 102 F (38.9 C), not controlled by medicine.   Your baby is older than 3 months with a rectal temperature of 102 F (38.9 C) or higher.   Your baby is 69 months old or younger with a rectal temperature of 100.4 F (38 C) or higher.   You have shaking chills.   You keep throwing up (vomiting) or cannot drink liquids.   There is blood in your vomit or you see blood in your bowel movements.   Your bowel movements become dark or black.   You have frequent bowel movements.   Your bowel movements stop (become blocked) or you cannot pass gas.   You have bloody, frequent, or painful urination.   You have yellow discoloration in the skin or whites of the eyes.   Your stomach becomes bloated or bigger.   You have dizziness or fainting.   You have chest or back pain.  MAKE SURE YOU:   Understand these instructions.   Will watch your condition.   Will get help right away if you are not doing well or get worse.  Document Released: 11/05/2005 Document Revised: 10/25/2011 Document Reviewed: 10/03/2009 ExitCare Patient Information 2012 [place], [place] Please make sure to keep your problem with Dr. Julio Sicks as scheduled next  week.

## 2012-01-26 NOTE — ED Provider Notes (Signed)
Medical screening examination/treatment/procedure(s) were performed by non-physician practitioner and as supervising physician I was immediately available for consultation/collaboration.   Glynn Octave, MD 01/26/12 [phone]

## 2012-01-30 ENCOUNTER — Emergency Department (HOSPITAL_COMMUNITY): Payer: Medicaid Other

## 2012-01-30 ENCOUNTER — Emergency Department (HOSPITAL_COMMUNITY)
Admission: EM | Admit: 2012-01-30 | Discharge: 2012-01-30 | Disposition: A | Discharge status: Home / Self Care | Payer: Medicaid Other | Attending: Emergency Medicine | Admitting: Emergency Medicine

## 2012-01-30 ENCOUNTER — Other Ambulatory Visit: Payer: Self-pay

## 2012-01-30 ENCOUNTER — Encounter (HOSPITAL_COMMUNITY): Payer: Self-pay | Admitting: Emergency Medicine

## 2012-01-30 DIAGNOSIS — R079 Chest pain, unspecified: Secondary | ICD-10-CM | POA: Insufficient documentation

## 2012-01-30 DIAGNOSIS — I517 Cardiomegaly: Secondary | ICD-10-CM | POA: Insufficient documentation

## 2012-01-30 DIAGNOSIS — I129 Hypertensive chronic kidney disease with stage 1 through stage 4 chronic kidney disease, or unspecified chronic kidney disease: Secondary | ICD-10-CM | POA: Insufficient documentation

## 2012-01-30 DIAGNOSIS — Z954 Presence of other heart-valve replacement: Secondary | ICD-10-CM | POA: Insufficient documentation

## 2012-01-30 DIAGNOSIS — K3184 Gastroparesis: Secondary | ICD-10-CM | POA: Insufficient documentation

## 2012-01-30 DIAGNOSIS — R0602 Shortness of breath: Secondary | ICD-10-CM | POA: Insufficient documentation

## 2012-01-30 DIAGNOSIS — R109 Unspecified abdominal pain: Secondary | ICD-10-CM | POA: Insufficient documentation

## 2012-01-30 DIAGNOSIS — N189 Chronic kidney disease, unspecified: Secondary | ICD-10-CM | POA: Insufficient documentation

## 2012-01-30 LAB — CBC
HCT: 33.7 % — ABNORMAL LOW (ref 39.0–52.0)
MCHC: 32.3 g/dL (ref 30.0–36.0)
MCV: 91.1 fL (ref 78.0–100.0)
RDW: 24.1 % — ABNORMAL HIGH (ref 11.5–15.5)

## 2012-01-30 LAB — DIFFERENTIAL
Basophils Absolute: 0 10*3/uL (ref 0.0–0.1)
Eosinophils Absolute: 0.2 10*3/uL (ref 0.0–0.7)
Lymphocytes Relative: 12 % (ref 12–46)
Lymphs Abs: 1.2 10*3/uL (ref 0.7–4.0)
Monocytes Absolute: 0.6 10*3/uL (ref 0.1–1.0)
Neutro Abs: 7.6 10*3/uL (ref 1.7–7.7)

## 2012-01-30 LAB — POCT I-STAT, CHEM 8
Calcium, Ion: 1.17 mmol/L (ref 1.12–1.32)
Glucose, Bld: 109 mg/dL — ABNORMAL HIGH (ref 70–99)
HCT: 34 % — ABNORMAL LOW (ref 39.0–52.0)
Hemoglobin: 11.6 g/dL — ABNORMAL LOW (ref 13.0–17.0)
TCO2: 23 mmol/L (ref 0–100)

## 2012-01-30 LAB — RAPID URINE DRUG SCREEN, HOSP PERFORMED
Barbiturates: NOT DETECTED
Tetrahydrocannabinol: NOT DETECTED

## 2012-01-30 LAB — POCT I-STAT TROPONIN I

## 2012-01-30 MED ORDER — SODIUM CHLORIDE 0.9 % IV BOLUS (SEPSIS)
1000.0000 mL | Freq: Once | INTRAVENOUS | Status: AC
  Administered 2012-01-30: 1000 mL via INTRAVENOUS

## 2012-01-30 MED ORDER — DIPHENHYDRAMINE HCL 50 MG/ML IJ SOLN
12.5000 mg | Freq: Once | INTRAMUSCULAR | Status: AC
  Administered 2012-01-30: 12.5 mg via INTRAVENOUS
  Filled 2012-01-30: qty 1

## 2012-01-30 MED ORDER — ONDANSETRON HCL 4 MG/2ML IJ SOLN
INTRAMUSCULAR | Status: AC
  Filled 2012-01-30: qty 2

## 2012-01-30 MED ORDER — FENTANYL CITRATE 0.05 MG/ML IJ SOLN
50.0000 ug | Freq: Once | INTRAMUSCULAR | Status: AC
  Administered 2012-01-30: 50 ug via INTRAVENOUS
  Filled 2012-01-30: qty 2

## 2012-01-30 MED ORDER — METOCLOPRAMIDE HCL 5 MG/ML IJ SOLN
10.0000 mg | Freq: Once | INTRAMUSCULAR | Status: AC
  Administered 2012-01-30: 10 mg via INTRAVENOUS
  Filled 2012-01-30: qty 2

## 2012-01-30 MED ORDER — ASPIRIN 81 MG PO CHEW
324.0000 mg | CHEWABLE_TABLET | Freq: Once | ORAL | Status: AC
  Administered 2012-01-30: 324 mg via ORAL
  Filled 2012-01-30: qty 4

## 2012-01-30 NOTE — ED Notes (Signed)
Pt waiting for d/c instructions by EDP.

## 2012-01-30 NOTE — ED Notes (Signed)
The pt was escorted to the d/c desk. Instructed to go to appt. Already made for 8:45 am with MD

## 2012-01-30 NOTE — ED Notes (Signed)
Brought in by EMS from home, presents to ED with c/o abdominal pain--onset at 0100am today. Pt also c/o nausea, fever and chills, headache and chest pain. Denies diarrhea.

## 2012-01-30 NOTE — Discharge Instructions (Signed)
Gastroparesis   Gastroparesis is also called slowed stomach emptying (delayed gastric emptying). It is a condition in which the stomach takes too long to empty its contents. It often happens in people with diabetes.   CAUSES   Gastroparesis happens when nerves to the stomach are damaged or stop working. When the nerves are damaged, the muscles of the stomach and intestines do not work normally. The movement of food is slowed or stopped. High blood glucose (sugar) causes changes in nerves and can damage the blood vessels that carry oxygen and nutrients to the nerves.  RISK FACTORS   Diabetes.   Post-viral syndromes.   Eating disorders (anorexia, bulimia).   Surgery on the stomach or vagus nerve.   Gastroesophageal reflux disease (rarely).   Smooth muscle disorders (amyloidosis, scleroderma).   Metabolic disorders, including hypothyroidism.   Parkinson's disease.  SYMPTOMS    Heartburn.   Feeling sick to your stomach (nausea).   Vomiting of undigested food.   An early feeling of fullness when eating.   Weight loss.   Abdominal bloating.   Erratic blood glucose levels.   Lack of appetite.   Gastroesophageal reflux.   Spasms of the stomach wall.  Complications can include:   Bacterial overgrowth in stomach. Food stays in the stomach and can ferment and cause bacteria to grow.   Weight loss due to difficulty digesting and absorbing nutrients.   Vomiting.   Obstruction in the stomach. Undigested food can harden and cause nausea and vomiting.   Blood glucose fluctuations caused by inconsistent food absorption.  DIAGNOSIS   The diagnosis of gastroparesis is confirmed through one or more of the following tests:   Barium X-rays and scans. These tests look at how long it takes for food to move through the stomach.   Gastric manometry. This test measures electrical and muscular activity in the stomach. A thin tube is passed down the throat into the stomach. The tube contains a wire that takes  measurements of the stomach's electrical and muscular activity as it digests liquids and solid food.   Endoscopy. This procedure is done with a long, thin tube called an endoscope. It is passed through the mouth and gently guides down the esophagus into the stomach. This tube helps the caregiver look at the lining of the stomach to check for any abnormalities.   Ultrasound. This can rule out gallbladder disease or pancreatitis. This test will outline and define the shape of the gallbladder and pancreas.  TREATMENT    The primary treatment is to identify the problem and help control blood glucose levels. Treatments include:   Exercise.   Medicines to control nausea and vomiting.   Medicines to stimulate stomach muscles.   Changes in what and when you eat.   Having smaller meals more often.   Eating low-fiber forms of high-fiber foods, such aseating cooked vegetables instead of raw vegetables.   Eating low-fat foods.   Consuming liquids, which are easier to digest.   In severe cases, feeding tubes and intravenous (IV) feeding may be needed.  It is important to note that in most cases, treatment does not cure gastroparesis. It is usually a lasting (chronic) condition. Treatment helps you manage the condition so that you can be as healthy and comfortable as possible.  NEW TREATMENTS   A gastric neurostimulator has been developed to assist people with gastroparesis. The battery-operated device is surgically implanted. It emits mild electrical pulses to help improve stomach emptying and to control nausea and   vomiting.   The use of botulinum toxin has been shown to improve stomach emptying by decreasing the prolonged contractions of the muscle between the stomach and the small intestine (pyloric sphincter). The benefits are temporary.  SEEK MEDICAL CARE IF:    You are having problems keeping your blood glucose in goal range.   You are having nausea, vomiting, bloating, or early feelings of fullness with  eating.   Your symptoms do not change with a change in diet.  Document Released: 11/05/2005 Document Revised: 10/25/2011 Document Reviewed: 04/14/2009  Exit[employer] Patient Information 2012 [employer].

## 2012-01-30 NOTE — ED Provider Notes (Signed)
History     CSN: [id number]  Arrival date & time 01/30/12  0135   First MD Initiated Contact with Patient 01/30/12 0221      Chief Complaint  Patient presents with  . Abdominal Pain  . Nausea    (Consider location/radiation/quality/duration/timing/severity/associated sxs/prior treatment) Patient is a 51 y.o. male presenting with abdominal pain and chest pain. The history is provided by the patient. No language interpreter was used.  Abdominal Pain The primary symptoms of the illness include abdominal pain, shortness of breath, nausea and vomiting. The primary symptoms of the illness do not include fever or diarrhea. The current episode started yesterday. The onset of the illness was sudden. The problem has not changed since onset. The abdominal pain began yesterday. The pain came on suddenly. The abdominal pain has been unchanged since its onset. The abdominal pain is generalized. The abdominal pain does not radiate. The severity of the abdominal pain is 10/10. The abdominal pain is relieved by nothing. The abdominal pain is exacerbated by vomiting.  Associated with: gastroparesis. The patient has not had a change in bowel habit. Risk factors for an acute abdominal problem include being elderly. Symptoms associated with the illness do not include diaphoresis or frequency. Significant associated medical issues include substance abuse.  Chest Pain The chest pain began 6 - 12 hours ago. Chest pain occurs constantly. The chest pain is resolved. Associated with: nothing. At its most intense, the pain is at 10/10. The pain is currently at 0/10. The severity of the pain is severe. The quality of the pain is described as dull. The pain does not radiate. Exacerbated by: nothing. Primary symptoms include shortness of breath, abdominal pain, nausea and vomiting. Pertinent negatives for primary symptoms include no fever, no syncope, no wheezing and no dizziness.  Pertinent negatives for associated  symptoms include no diaphoresis. He tried nothing for the symptoms. Risk factors include male gender.  His past medical history is significant for hypertension.  Pertinent negatives for family medical history include: no Marfan's syndrome in family. Procedure history comments: heart surgery.     Past Medical History  Diagnosis Date  . Hypertension   . Bipolar disorder   . Schizophrenia     h/o hospitalized inpt therapy @ the [facility]  . 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 Hyperlipidemia   . Chronic kidney disease   . H/O: suicide attempt 1998    was hospitalized at [facility]  . Anemia     Hx of iron infusions  . CHF (congestive heart failure)     Hx of Class IV CHF    Past Surgical History  Procedure Date  . Mitral valve repair 2011  . Av fistula placement, radiocephalic 05/24/2011    Left arm  . Leg surgery     Family History  Problem Relation Age of Onset  . Hypertension Mother     History  Substance Use Topics  . Smoking status: Current Some Day Smoker -- 30 years    Types: Cigarettes  . Smokeless tobacco: Never Used  . Alcohol Use: No     Quit alcohol in July of 2011      Review of Systems  Constitutional: Negative for fever and diaphoresis.  HENT: Negative.   Eyes: Negative.   Respiratory: Positive for shortness of breath. Negative for wheezing.   Cardiovascular: Positive for chest pain. Negative for leg swelling  and syncope.  Gastrointestinal: Positive for nausea, vomiting and abdominal pain. Negative for diarrhea.  Genitourinary: Negative for frequency.  Musculoskeletal: Negative.   Neurological: Negative for dizziness.  Hematological: Negative.   Psychiatric/Behavioral: Negative.     Allergies  Food  Home Medications   Current Outpatient Rx  Name Route Sig Dispense Refill  . AMLODIPINE BESYLATE 5 MG PO TABS Oral Take 1 tablet (5 mg total) by mouth  daily. 30 tablet 0  . ARIPIPRAZOLE 5 MG PO TABS Oral Take 5 mg by mouth daily.      . ASPIRIN 81 MG PO TBEC Oral Take 81 mg by mouth daily.      . CYCLOBENZAPRINE HCL 5 MG PO TABS Oral Take 5 mg by mouth 3 (three) times daily as needed. Muscle pain    . DOCUSATE SODIUM 100 MG PO CAPS Oral Take 100 mg by mouth daily as needed. For constipation    . OMEGA-3 FATTY ACIDS 1000 MG PO CAPS Oral Take 2 capsules (2 g total) by mouth daily. 30 capsule 0  . FLUOXETINE HCL 20 MG PO CAPS Oral Take 40 mg by mouth daily.    . FUROSEMIDE 80 MG PO TABS Oral Take 80 mg by mouth 2 (two) times daily.    Marland Kitchen HYDRALAZINE HCL 25 MG PO TABS Oral Take 2 tablets (50 mg total) by mouth 3 (three) times daily. 30 tablet 0  . LABETALOL HCL 200 MG PO TABS Oral Take 400 mg by mouth 3 (three) times daily.     Marland Kitchen METOCLOPRAMIDE HCL 5 MG PO TABS Oral Take 5 mg by mouth 4 (four) times daily.    Marland Kitchen ONE-DAILY MULTI VITAMINS PO TABS Oral Take 1 tablet by mouth daily. 30 tablet 0  . ONDANSETRON HCL 4 MG PO TABS Oral Take 4 mg by mouth as needed. Nausea    . OXYCODONE HCL 5 MG PO TABS Oral Take 1 tablet (5 mg total) by mouth every 6 (six) hours as needed for pain. 20 tablet 0  . PROMETHAZINE HCL 25 MG PO TABS Oral Take 25 mg by mouth every 6 (six) hours as needed. Nausea    . RISPERIDONE 3 MG PO TABS Oral Take 3 mg by mouth at bedtime.      . TRAZODONE HCL 50 MG PO TABS Oral Take 50 mg by mouth at bedtime. For sleep     . VITAMIN C 500 MG PO TABS Oral Take 1 tablet (500 mg total) by mouth daily. 30 tablet 0    BP 116/93  Resp 24  SpO2 100%  Physical Exam  Constitutional: He is oriented to person, place, and time. He appears well-developed and well-nourished.  HENT:  Head: Normocephalic.  Mouth/Throat: Oropharynx is clear and moist.  Eyes: Conjunctivae are normal. Pupils are equal, round, and reactive to light.  Neck: Normal range of motion. Neck supple.  Cardiovascular: Normal rate and regular rhythm.   Pulmonary/Chest: Effort  normal and breath sounds normal. He has no wheezes. He has no rales.  Abdominal: Soft. Bowel sounds are normal. He exhibits no distension. There is no rebound and no guarding.  Musculoskeletal: Normal range of motion. He exhibits no edema.  Neurological: He is alert and oriented to person, place, and time.  Skin: Skin is warm and dry. He is not diaphoretic.  Psychiatric: He has a normal mood and affect.    ED Course  Procedures (including critical care time)  Labs Reviewed  CBC - Abnormal; Notable for the following:  RBC 3.70 (*)    Hemoglobin 10.9 (*)    HCT 33.7 (*)    RDW 24.1 (*)    All other components within normal limits  DIFFERENTIAL - Abnormal; Notable for the following:    Neutrophils Relative 80 (*)    All other components within normal limits  POCT I-STAT, CHEM 8 - Abnormal; Notable for the following:    BUN 52 (*)    Creatinine, Ser 3.50 (*)    Glucose, Bld 109 (*)    Hemoglobin 11.6 (*)    HCT 34.0 (*)    All other components within normal limits  POCT I-STAT TROPONIN I  URINE RAPID DRUG SCREEN (HOSP PERFORMED)   Dg Abd Acute W/chest  01/30/2012  *RADIOLOGY REPORT*  Clinical Data: Abdominal pain  ACUTE ABDOMEN SERIES (ABDOMEN 2 VIEW & CHEST 1 VIEW)  Comparison: 01/24/2012  Findings: Cardiomegaly.  Status post median sternotomy and valve replacement.  No focal consolidation.  No pneumothorax.  Nonobstructive bowel gas pattern.  No free intraperitoneal air. Organ outlines normal where seen.  No acute osseous abnormality.  IMPRESSION: No focal consolidation.  Cardiomegaly status post mitral valve replacement.  Nonobstructive bowel gas pattern.  Original Report Authenticated By: Waneta Martins, M.D.     No diagnosis found.    MDM   Date: 01/30/2012  Rate: 74  Rhythm: normal sinus rhythm  QRS Axis: normal  Intervals: normal  ST/T Wave abnormalities: normal  Conduction Disutrbances:none  Narrative Interpretation:   Old EKG Reviewed: unchanged  Return  for chest pain shortness of breath or any concerns, follow up with gastroenterology.         Jasmine Awe, MD 01/30/12 [phone]

## 2012-02-02 ENCOUNTER — Emergency Department (HOSPITAL_COMMUNITY): Payer: Medicaid Other

## 2012-02-02 ENCOUNTER — Other Ambulatory Visit: Payer: Self-pay

## 2012-02-02 ENCOUNTER — Emergency Department (HOSPITAL_COMMUNITY)
Admission: EM | Admit: 2012-02-02 | Discharge: 2012-02-02 | Disposition: A | Discharge status: Home / Self Care | Payer: Medicaid Other | Attending: Emergency Medicine | Admitting: Emergency Medicine

## 2012-02-02 ENCOUNTER — Encounter (HOSPITAL_COMMUNITY): Payer: Self-pay | Admitting: *Deleted

## 2012-02-02 DIAGNOSIS — N189 Chronic kidney disease, unspecified: Secondary | ICD-10-CM | POA: Insufficient documentation

## 2012-02-02 DIAGNOSIS — R079 Chest pain, unspecified: Secondary | ICD-10-CM | POA: Insufficient documentation

## 2012-02-02 DIAGNOSIS — F319 Bipolar disorder, unspecified: Secondary | ICD-10-CM | POA: Insufficient documentation

## 2012-02-02 DIAGNOSIS — I129 Hypertensive chronic kidney disease with stage 1 through stage 4 chronic kidney disease, or unspecified chronic kidney disease: Secondary | ICD-10-CM | POA: Insufficient documentation

## 2012-02-02 DIAGNOSIS — F209 Schizophrenia, unspecified: Secondary | ICD-10-CM | POA: Insufficient documentation

## 2012-02-02 DIAGNOSIS — R0602 Shortness of breath: Secondary | ICD-10-CM | POA: Insufficient documentation

## 2012-02-02 DIAGNOSIS — F419 Anxiety disorder, unspecified: Secondary | ICD-10-CM

## 2012-02-02 DIAGNOSIS — I509 Heart failure, unspecified: Secondary | ICD-10-CM | POA: Insufficient documentation

## 2012-02-02 DIAGNOSIS — R259 Unspecified abnormal involuntary movements: Secondary | ICD-10-CM | POA: Insufficient documentation

## 2012-02-02 DIAGNOSIS — F17200 Nicotine dependence, unspecified, uncomplicated: Secondary | ICD-10-CM | POA: Insufficient documentation

## 2012-02-02 DIAGNOSIS — F411 Generalized anxiety disorder: Secondary | ICD-10-CM | POA: Insufficient documentation

## 2012-02-02 DIAGNOSIS — I059 Rheumatic mitral valve disease, unspecified: Secondary | ICD-10-CM | POA: Insufficient documentation

## 2012-02-02 LAB — COMPREHENSIVE METABOLIC PANEL
Albumin: 3.1 g/dL — ABNORMAL LOW (ref 3.5–5.2)
Alkaline Phosphatase: 123 U/L — ABNORMAL HIGH (ref 39–117)
BUN: 56 mg/dL — ABNORMAL HIGH (ref 6–23)
Calcium: 8.8 mg/dL (ref 8.4–10.5)
Potassium: 4.5 mEq/L (ref 3.5–5.1)
Sodium: 139 mEq/L (ref 135–145)
Total Protein: 6.4 g/dL (ref 6.0–8.3)

## 2012-02-02 LAB — CBC
HCT: 32.3 % — ABNORMAL LOW (ref 39.0–52.0)
MCH: 29.1 pg (ref 26.0–34.0)
MCHC: 32.2 g/dL (ref 30.0–36.0)
RDW: 22.8 % — ABNORMAL HIGH (ref 11.5–15.5)

## 2012-02-02 LAB — PRO B NATRIURETIC PEPTIDE: Pro B Natriuretic peptide (BNP): 2821 pg/mL — ABNORMAL HIGH (ref 0–125)

## 2012-02-02 LAB — TROPONIN I: Troponin I: <0.3 ng/mL (ref <0.30)

## 2012-02-02 MED ORDER — LORAZEPAM 2 MG/ML IJ SOLN
1.0000 mg | Freq: Once | INTRAMUSCULAR | Status: AC
  Administered 2012-02-02: 1 mg via INTRAVENOUS
  Filled 2012-02-02: qty 1

## 2012-02-02 MED ORDER — LORAZEPAM 1 MG PO TABS: 1.0000 mg | ORAL_TABLET | Freq: Three times a day (TID) | ORAL | Status: AC | PRN

## 2012-02-02 NOTE — ED Provider Notes (Signed)
History     CSN: [id number]  Arrival date & time 02/02/12  0103   First MD Initiated Contact with Patient 02/02/12 0128      Chief Complaint  Patient presents with  . Chest Pain    (Consider location/radiation/quality/duration/timing/severity/associated sxs/prior treatment) HPI Patient is a 51 yo male with a history of mitral valve replacement and CHF who presents today complaining of chest pain since earlier today that he rates as a 10/10.  Patient also complains of intermittent full body shaking episodes.  He has a history of schizophrenia and is feeling very anxious though he denies SI, HI, or hallucinations.  Patient endorses associated shortness of breath.  He denies cough or fever.  There is no radiation of the patient's pain.  One of the shaking episodes is witnessed and this involves the patient rapidly shaking both upper extremities for 10 seconds with no seizure-like activity while remaining conscious.There are no other associated or modifying factors.  Past Medical History  Diagnosis Date  . Hypertension   . Bipolar disorder   . Schizophrenia     h/o hospitalized inpt therapy @ the [facility]  . 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 Hyperlipidemia   . Chronic kidney disease   . H/O: suicide attempt 1998    was hospitalized at [facility]  . Anemia     Hx of iron infusions  . CHF (congestive heart failure)     Hx of Class IV CHF    Past Surgical History  Procedure Date  . Mitral valve repair 2011  . Av fistula placement, radiocephalic 05/24/2011    Left arm  . Leg surgery     Family History  Problem Relation Age of Onset  . Hypertension Mother     History  Substance Use Topics  . Smoking status: Current Some Day Smoker -- 30 years    Types: Cigarettes  . Smokeless tobacco: Never Used  . Alcohol Use: No     Quit alcohol in July of 2011      Review of Systems    Constitutional: Negative.   HENT: Negative.   Eyes: Negative.   Respiratory: Positive for shortness of breath.   Cardiovascular: Positive for chest pain.  Gastrointestinal: Negative.   Genitourinary: Negative.   Musculoskeletal: Negative.   Skin: Negative.   Neurological: Positive for tremors.  Hematological: Negative.   Psychiatric/Behavioral: The patient is nervous/anxious.   All other systems reviewed and are negative.    Allergies  Food  Home Medications   Current Outpatient Rx  Name Route Sig Dispense Refill  . AMLODIPINE BESYLATE 5 MG PO TABS Oral Take 1 tablet (5 mg total) by mouth daily. 30 tablet 0  . ARIPIPRAZOLE 5 MG PO TABS Oral Take 5 mg by mouth daily.      . ASPIRIN 81 MG PO TBEC Oral Take 81 mg by mouth daily.      . CYCLOBENZAPRINE HCL 5 MG PO TABS Oral Take 5 mg by mouth 3 (three) times daily as needed. Muscle pain    . DOCUSATE SODIUM 100 MG PO CAPS Oral Take 100 mg by mouth daily as needed. For constipation    . OMEGA-3 FATTY ACIDS 1000 MG PO CAPS Oral Take 2 capsules (2 g total) by mouth daily. 30 capsule 0  . FLUOXETINE HCL 20 MG PO CAPS Oral Take 40 mg by mouth daily.    Marland Kitchen  FUROSEMIDE 80 MG PO TABS Oral Take 80 mg by mouth 2 (two) times daily.    Marland Kitchen HYDRALAZINE HCL 25 MG PO TABS Oral Take 2 tablets (50 mg total) by mouth 3 (three) times daily. 30 tablet 0  . LABETALOL HCL 200 MG PO TABS Oral Take 400 mg by mouth 3 (three) times daily.     Marland Kitchen LISINOPRIL 20 MG PO TABS Oral Take 20 mg by mouth daily.    Marland Kitchen METOCLOPRAMIDE HCL 5 MG PO TABS Oral Take 5 mg by mouth 4 (four) times daily.    Marland Kitchen ONE-DAILY MULTI VITAMINS PO TABS Oral Take 1 tablet by mouth daily. 30 tablet 0  . NABUMETONE 500 MG PO TABS Oral Take 500 mg by mouth 2 (two) times daily.    Marland Kitchen ONDANSETRON HCL 4 MG PO TABS Oral Take 4 mg by mouth as needed. Nausea    . OXYCODONE HCL 5 MG PO TABS Oral Take 1 tablet (5 mg total) by mouth every 6 (six) hours as needed for pain. 20 tablet 0  . PROMETHAZINE HCL 25  MG PO TABS Oral Take 25 mg by mouth every 6 (six) hours as needed. Nausea    . RISPERIDONE 3 MG PO TABS Oral Take 3 mg by mouth at bedtime.      . TRAZODONE HCL 50 MG PO TABS Oral Take 50 mg by mouth at bedtime. For sleep     . VITAMIN C 500 MG PO TABS Oral Take 1 tablet (500 mg total) by mouth daily. 30 tablet 0  . LORAZEPAM 1 MG PO TABS Oral Take 1 tablet (1 mg total) by mouth every 8 (eight) hours as needed for anxiety. 10 tablet 0    BP 172/84  Pulse 78  Temp(Src) 98.3 F (36.8 C) (Oral)  Resp 16  Ht 5\' 6"  (1.676 m)  Wt 149 lb (67.586 kg)  BMI 24.05 kg/m2  SpO2 99%  Physical Exam  Nursing note and vitals reviewed. GEN: Well-developed, well-nourished male in mild distress, anxious and pacing the room HEENT: Atraumatic, normocephalic. Oropharynx clear without erythema EYES: PERRLA BL, no scleral icterus. NECK: Trachea midline, no meningismus CV: regular rate and rhythm. Systolic murmur, no rubs, or gallops PULM: No respiratory distress.  No crackles, wheezes, or rales. GI: soft, non-tender. No guarding, rebound, or tenderness. + bowel sounds  GU: deferred Neuro: cranial nerves 2-12 intact, no abnormalities of strength or sensation, A and O x 3, one shaking spell witnessed where the patient shakes both of his arms rapidly for a few seconds while patient was completely conscious MSK: Patient moves all 4 extremities symmetrically, no deformity, edema, or injury noted Skin: No rashes petechiae, purpura, or jaundice Psych: Patient denies SI, HI, and hallucinations. Patient is very anxious and does not often sit still   ED Course  Procedures (including critical care time)   Date: 02/02/2012  Rate: 108  Rhythm: sinus tachycardia  QRS Axis: normal  Intervals: normal  ST/T Wave abnormalities: normal  Conduction Disutrbances:none  Narrative Interpretation:   Old EKG Reviewed: unchanged   Labs Reviewed  PRO B NATRIURETIC PEPTIDE - Abnormal; Notable for the following:    Pro B  Natriuretic peptide (BNP) 2821.0 (*)    All other components within normal limits  CBC - Abnormal; Notable for the following:    RBC 3.58 (*)    Hemoglobin 10.4 (*)    HCT 32.3 (*)    RDW 22.8 (*)    All other components within normal limits  COMPREHENSIVE METABOLIC PANEL - Abnormal; Notable for the following:    BUN 56 (*)    Creatinine, Ser 3.40 (*)    Albumin 3.1 (*)    AST 67 (*)    Alkaline Phosphatase 123 (*)    Total Bilirubin 2.1 (*)    GFR calc non Af Amer 20 (*)    GFR calc Af Amer 23 (*)    All other components within normal limits  TROPONIN I  TROPONIN I   Dg Chest 2 View  02/02/2012  *RADIOLOGY REPORT*  Clinical Data: Mid chest pain and shortness of breath; history of smoking.  CHEST - 2 VIEW  Comparison: Chest radiograph performed 01/02/2012  Findings: The lungs are well-aerated.  Mild chronic peribronchial thickening is noted.  There is no evidence of focal opacification, pleural effusion or pneumothorax.  Bilateral nipple shadows are seen.  The heart is mildly enlarged; the patient is status post median sternotomy.  An aortic valve replacement is noted.  No acute osseous abnormalities are seen.  IMPRESSION: Mild cardiomegaly; no acute cardiopulmonary process seen.  Original Report Authenticated By: Tonia Ghent, M.D.     1. Chest pain   2. Anxiety       MDM  Patient was evaluated and complained of chest pain and shortness of breath.  Given his history of heart failure and symptoms patient was worked up for chest pain including a BNP.  While this was elevated it was no higher than prior values.  ECG was unchanged.  CXR was unremarkable as were TNI at 0 and 3 hours.  Patient was given ativan 1 mg IV and his symptoms resolved.  His BPs were somewhat elevated during visit but were fine would patient would sit down.  Patient will need to follow-up with his mental health provider.  I was comfortable giving the patient a short course of ativan as this helped his symptoms  and can be controlled by the patient's wife who is at bedside.  Patient was pain free at discharge and was discharged in good condition.        Cyndra Numbers, MD 02/02/12 [phone]

## 2012-02-02 NOTE — ED Notes (Signed)
Received report, pt. Resting on stretcher with eyes closed, pt. Arousible NAD noted

## 2012-02-02 NOTE — Discharge Instructions (Signed)
Chest Pain (Nonspecific) It is often hard to give a specific diagnosis for the cause of chest pain. There is always a chance that your pain could be related to something serious, such as a heart attack or a blood clot in the lungs. You need to follow up with your caregiver for further evaluation. CAUSES   Heartburn.   Pneumonia or bronchitis.   Anxiety or stress.   Inflammation around your heart (pericarditis) or lung (pleuritis or pleurisy).   A blood clot in the lung.   A collapsed lung (pneumothorax). It can develop suddenly on its own (spontaneous pneumothorax) or from injury (trauma) to the chest.   Shingles infection (herpes zoster virus).  The chest wall is composed of bones, muscles, and cartilage. Any of these can be the source of the pain.  The bones can be bruised by injury.   The muscles or cartilage can be strained by coughing or overwork.   The cartilage can be affected by inflammation and become sore (costochondritis).  DIAGNOSIS  Lab tests or other studies, such as X-rays, electrocardiography, stress testing, or cardiac imaging, may be needed to find the cause of your pain.  TREATMENT   Treatment depends on what may be causing your chest pain. Treatment may include:   Acid blockers for heartburn.   Anti-inflammatory medicine.   Pain medicine for inflammatory conditions.   Antibiotics if an infection is present.   You may be advised to change lifestyle habits. This includes stopping smoking and avoiding alcohol, caffeine, and chocolate.   You may be advised to keep your head raised (elevated) when sleeping. This reduces the chance of acid going backward from your stomach into your esophagus.   Most of the time, nonspecific chest pain will improve within 2 to 3 days with rest and mild pain medicine.  HOME CARE INSTRUCTIONS   If antibiotics were prescribed, take your antibiotics as directed. Finish them even if you start to feel better.   For the next few  days, avoid physical activities that bring on chest pain. Continue physical activities as directed.   Do not smoke.   Avoid drinking alcohol.   Only take over-the-counter or prescription medicine for pain, discomfort, or fever as directed by your caregiver.   Follow your caregiver's suggestions for further testing if your chest pain does not go away.   Keep any follow-up appointments you made. If you do not go to an appointment, you could develop lasting (chronic) problems with pain. If there is any problem keeping an appointment, you must call to reschedule.  SEEK MEDICAL CARE IF:   You think you are having problems from the medicine you are taking. Read your medicine instructions carefully.   Your chest pain does not go away, even after treatment.   You develop a rash with blisters on your chest.  SEEK IMMEDIATE MEDICAL CARE IF:   You have increased chest pain or pain that spreads to your arm, neck, jaw, back, or abdomen.   You develop shortness of breath, an increasing cough, or you are coughing up blood.   You have severe back or abdominal pain, feel nauseous, or vomit.   You develop severe weakness, fainting, or chills.   You have a fever.  THIS IS AN EMERGENCY. Do not wait to see if the pain will go away. Get medical help at once. Call your local emergency services (911 in [place] Do not drive yourself to the hospital. MAKE SURE YOU:   Understand these instructions.     Will watch your condition.   Will get help right away if you are not doing well or get worse.  Document Released: 08/15/2005 Document Revised: 10/25/2011 Document Reviewed: 06/10/2008 Exit[employer] Patient Information 2012 [employer].  Anxiety and Panic Attacks Your caregiver has informed you that you are having an anxiety or panic attack. There may be many forms of this. Most of the time these attacks come suddenly and without warning. They come at any time of day, including periods of sleep, and at any  time of life. They may be strong and unexplained. Although panic attacks are very scary, they are physically harmless. Sometimes the cause of your anxiety is not known. Anxiety is a protective mechanism of the body in its fight or flight mechanism. Most of these perceived danger situations are actually nonphysical situations (such as anxiety over losing a job). CAUSES  The causes of an anxiety or panic attack are many. Panic attacks may occur in otherwise healthy people given a certain set of circumstances. There may be a genetic cause for panic attacks. Some medications may also have anxiety as a side effect. SYMPTOMS  Some of the most common feelings are:  Intense terror.   Dizziness, feeling faint.   Hot and cold flashes.   Fear of going crazy.   Feelings that nothing is real.   Sweating.   Shaking.   Chest pain or a fast heartbeat (palpitations).   Smothering, choking sensations.   Feelings of impending doom and that death is near.   Tingling of extremities, this may be from over-breathing.   Altered reality (derealization).   Being detached from yourself (depersonalization).  Several symptoms can be present to make up anxiety or panic attacks. DIAGNOSIS  The evaluation by your caregiver will depend on the type of symptoms you are experiencing. The diagnosis of anxiety or panic attack is made when no physical illness can be determined to be a cause of the symptoms. TREATMENT  Treatment to prevent anxiety and panic attacks may include:  Avoidance of circumstances that cause anxiety.   Reassurance and relaxation.   Regular exercise.   Relaxation therapies, such as yoga.   Psychotherapy with a psychiatrist or therapist.   Avoidance of caffeine, alcohol and illegal drugs.   Prescribed medication.  SEEK IMMEDIATE MEDICAL CARE IF:   You experience panic attack symptoms that are different than your usual symptoms.   You have any worsening or concerning symptoms.    Document Released: 11/05/2005 Document Revised: 10/25/2011 Document Reviewed: 03/09/2010 Exit[employer] Patient Information 2012 [employer].   

## 2012-02-02 NOTE — ED Notes (Signed)
Pt states that he began to shake uncontrollably yet intermittently at the same time that he began to have chest pain.  Pt states that his chest pain is made worse by the shaking in his arms and legs.  Pt denies hx of same.

## 2012-02-08 ENCOUNTER — Other Ambulatory Visit: Payer: Self-pay | Admitting: Gastroenterology

## 2012-02-19 ENCOUNTER — Ambulatory Visit (INDEPENDENT_AMBULATORY_CARE_PROVIDER_SITE_OTHER): Payer: Medicaid Other | Admitting: Cardiovascular Disease

## 2012-02-19 ENCOUNTER — Emergency Department (HOSPITAL_COMMUNITY): Payer: Medicaid Other

## 2012-02-19 ENCOUNTER — Emergency Department (HOSPITAL_COMMUNITY)
Admission: EM | Admit: 2012-02-19 | Discharge: 2012-02-19 | Disposition: A | Discharge status: Home / Self Care | Payer: Medicaid Other | Attending: Emergency Medicine | Admitting: Emergency Medicine

## 2012-02-19 ENCOUNTER — Encounter (HOSPITAL_COMMUNITY): Payer: Self-pay | Admitting: Emergency Medicine

## 2012-02-19 ENCOUNTER — Other Ambulatory Visit: Payer: Self-pay

## 2012-02-19 ENCOUNTER — Encounter: Payer: Self-pay | Admitting: Cardiovascular Disease

## 2012-02-19 VITALS — BP 120/90 | HR 86 | Ht 65.0 in | Wt 155.8 lb

## 2012-02-19 DIAGNOSIS — I509 Heart failure, unspecified: Secondary | ICD-10-CM | POA: Insufficient documentation

## 2012-02-19 DIAGNOSIS — I340 Nonrheumatic mitral (valve) insufficiency: Secondary | ICD-10-CM

## 2012-02-19 DIAGNOSIS — I129 Hypertensive chronic kidney disease with stage 1 through stage 4 chronic kidney disease, or unspecified chronic kidney disease: Secondary | ICD-10-CM | POA: Insufficient documentation

## 2012-02-19 DIAGNOSIS — R9431 Abnormal electrocardiogram [ECG] [EKG]: Secondary | ICD-10-CM | POA: Diagnosis present

## 2012-02-19 DIAGNOSIS — Z7982 Long term (current) use of aspirin: Secondary | ICD-10-CM | POA: Insufficient documentation

## 2012-02-19 DIAGNOSIS — R112 Nausea with vomiting, unspecified: Secondary | ICD-10-CM | POA: Insufficient documentation

## 2012-02-19 DIAGNOSIS — R1013 Epigastric pain: Secondary | ICD-10-CM | POA: Insufficient documentation

## 2012-02-19 DIAGNOSIS — N184 Chronic kidney disease, stage 4 (severe): Secondary | ICD-10-CM | POA: Diagnosis present

## 2012-02-19 DIAGNOSIS — I5032 Chronic diastolic (congestive) heart failure: Secondary | ICD-10-CM

## 2012-02-19 DIAGNOSIS — F319 Bipolar disorder, unspecified: Secondary | ICD-10-CM | POA: Insufficient documentation

## 2012-02-19 DIAGNOSIS — I059 Rheumatic mitral valve disease, unspecified: Secondary | ICD-10-CM

## 2012-02-19 DIAGNOSIS — I10 Essential (primary) hypertension: Secondary | ICD-10-CM

## 2012-02-19 DIAGNOSIS — Z8659 Personal history of other mental and behavioral disorders: Secondary | ICD-10-CM | POA: Insufficient documentation

## 2012-02-19 DIAGNOSIS — R079 Chest pain, unspecified: Secondary | ICD-10-CM | POA: Insufficient documentation

## 2012-02-19 DIAGNOSIS — E785 Hyperlipidemia, unspecified: Secondary | ICD-10-CM | POA: Insufficient documentation

## 2012-02-19 DIAGNOSIS — Z79899 Other long term (current) drug therapy: Secondary | ICD-10-CM | POA: Insufficient documentation

## 2012-02-19 DIAGNOSIS — I5033 Acute on chronic diastolic (congestive) heart failure: Secondary | ICD-10-CM | POA: Diagnosis present

## 2012-02-19 DIAGNOSIS — K3184 Gastroparesis: Secondary | ICD-10-CM | POA: Diagnosis present

## 2012-02-19 DIAGNOSIS — N189 Chronic kidney disease, unspecified: Secondary | ICD-10-CM | POA: Insufficient documentation

## 2012-02-19 HISTORY — DX: Gastroparesis: K31.84

## 2012-02-19 LAB — LIPASE, BLOOD: Lipase: 52 U/L (ref 11–59)

## 2012-02-19 LAB — CBC
HCT: 30.1 % — ABNORMAL LOW (ref 39.0–52.0)
Hemoglobin: 9.5 g/dL — ABNORMAL LOW (ref 13.0–17.0)
MCH: 28.5 pg (ref 26.0–34.0)
MCHC: 31.6 g/dL (ref 30.0–36.0)
MCV: 90.4 fL (ref 78.0–100.0)
Platelets: 162 10*3/uL (ref 150–400)
RBC: 3.33 MIL/uL — ABNORMAL LOW (ref 4.22–5.81)
RDW: 22.2 % — ABNORMAL HIGH (ref 11.5–15.5)
WBC: 8.1 10*3/uL (ref 4.0–10.5)

## 2012-02-19 LAB — BASIC METABOLIC PANEL
BUN: 48 mg/dL — ABNORMAL HIGH (ref 6–23)
CO2: 24 mEq/L (ref 19–32)
Calcium: 8.8 mg/dL (ref 8.4–10.5)
Chloride: 103 mEq/L (ref 96–112)
Creatinine, Ser: 3.42 mg/dL — ABNORMAL HIGH (ref 0.50–1.35)
GFR calc Af Amer: 23 mL/min — ABNORMAL LOW (ref >90)
GFR calc non Af Amer: 19 mL/min — ABNORMAL LOW (ref >90)
Glucose, Bld: 99 mg/dL (ref 70–99)
Potassium: 4.7 mEq/L (ref 3.5–5.1)
Sodium: 137 mEq/L (ref 135–145)

## 2012-02-19 LAB — TROPONIN I: Troponin I: <0.3 ng/mL (ref <0.30)

## 2012-02-19 MED ORDER — METOCLOPRAMIDE HCL 10 MG PO TABS: 10.0000 mg | ORAL_TABLET | Freq: Four times a day (QID) | ORAL | Status: DC

## 2012-02-19 MED ORDER — GI COCKTAIL ~~LOC~~
30.0000 mL | Freq: Once | ORAL | Status: AC
  Administered 2012-02-19: 30 mL via ORAL
  Filled 2012-02-19: qty 30

## 2012-02-19 MED ORDER — ASPIRIN 81 MG PO CHEW
324.0000 mg | CHEWABLE_TABLET | Freq: Once | ORAL | Status: AC
  Administered 2012-02-19: 324 mg via ORAL
  Filled 2012-02-19: qty 4

## 2012-02-19 MED ORDER — ONDANSETRON HCL 4 MG/2ML IJ SOLN: 4.0000 mg | Freq: Three times a day (TID) | INTRAMUSCULAR | Status: DC | PRN

## 2012-02-19 MED ORDER — METOCLOPRAMIDE HCL 5 MG/ML IJ SOLN
10.0000 mg | Freq: Once | INTRAMUSCULAR | Status: AC
  Administered 2012-02-19: 10 mg via INTRAVENOUS
  Filled 2012-02-19: qty 2

## 2012-02-19 MED ORDER — HYDRALAZINE HCL 25 MG PO TABS: 25.0000 mg | ORAL_TABLET | Freq: Three times a day (TID) | ORAL | Status: DC

## 2012-02-19 MED ORDER — SODIUM CHLORIDE 0.9 % IV SOLN: INTRAVENOUS | Status: DC

## 2012-02-19 NOTE — ED Provider Notes (Signed)
History    51 year old male with chest and epigastric pain. Woke him  up from sleep at night. Went to bed in his usual state of health. Pain is in the epigastric region and lower sternal area. Achy and occasionally sharp her pain. No radiation. Nausea and vomited once prior to coming to the emergency room. No fevers or chills. No diarrhea. No sick contacts. Patient has a history of mitral valve prolapse, status post repair. Also history of gastroparesis. States his current symptoms feel similar to those been diagnosed as gastroparesis. Reports that symptoms have been improved with Reglan but is currently out.   CSN: [id number]  Arrival date & time 02/19/12  5784   First MD Initiated Contact with Patient 02/19/12 [phone]      Chief Complaint  Patient presents with  . Chest Pain    (Consider location/radiation/quality/duration/timing/severity/associated sxs/prior treatment) HPI  Past Medical History  Diagnosis Date  . Hypertension   . Bipolar disorder   . Schizophrenia     h/o hospitalized inpt therapy @ the [facility]  . 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 Hyperlipidemia   . Chronic kidney disease   . H/O: suicide attempt 1998    was hospitalized at [facility]  . Anemia     Hx of iron infusions  . CHF (congestive heart failure)     Hx of Class IV CHF  . Renal disorder   . Gastroparesis     Past Surgical History  Procedure Date  . Mitral valve repair 2011  . Av fistula placement, radiocephalic 05/24/2011    Left arm  . Leg surgery     Family History  Problem Relation Age of Onset  . Hypertension Mother     History  Substance Use Topics  . Smoking status: Former Smoker -- 30 years    Types: Cigarettes    Quit date: 03/21/2011  . Smokeless tobacco: Never Used  . Alcohol Use: No     Quit alcohol in July of 2011      Review of Systems   Review of symptoms negative unless  otherwise noted in HPI.   Allergies  Food  Home Medications   Current Outpatient Rx  Name Route Sig Dispense Refill  . ARIPIPRAZOLE 5 MG PO TABS Oral Take 5 mg by mouth daily.      . ASPIRIN 81 MG PO TBEC Oral Take 81 mg by mouth daily.      . CYCLOBENZAPRINE HCL 5 MG PO TABS Oral Take 5 mg by mouth 3 (three) times daily as needed. Muscle pain    . DOCUSATE SODIUM 100 MG PO CAPS Oral Take 100 mg by mouth daily as needed. For constipation    . OMEGA-3 FATTY ACIDS 1000 MG PO CAPS Oral Take 2 capsules (2 g total) by mouth daily. 30 capsule 0  . FUROSEMIDE 80 MG PO TABS Oral Take 80 mg by mouth 2 (two) times daily.    Marland Kitchen HYDROXYZINE PAMOATE 50 MG PO CAPS Oral Take 100 mg by mouth at bedtime.    Marland Kitchen LISINOPRIL 20 MG PO TABS Oral Take 20 mg by mouth daily.    Marland Kitchen METOPROLOL TARTRATE 50 MG PO TABS Oral Take 50 mg by mouth 2 (two) times daily.    Marland Kitchen ONE-DAILY MULTI VITAMINS PO TABS Oral Take 1 tablet by mouth daily. 30 tablet 0  . NABUMETONE 500 MG PO TABS  Oral Take 500 mg by mouth 2 (two) times daily.    Marland Kitchen PROMETHAZINE HCL 25 MG PO TABS Oral Take 25 mg by mouth every 6 (six) hours as needed. Nausea    . VITAMIN C 500 MG PO TABS Oral Take 1 tablet (500 mg total) by mouth daily. 30 tablet 0    BP 151/105  Pulse 97  Temp(Src) 97.7 F (36.5 C) (Oral)  Resp 14  SpO2 100%  Physical Exam  Nursing note and vitals reviewed. Constitutional: He appears well-developed and well-nourished. No distress.       Laying in bed. No acute distress.  HENT:  Head: Normocephalic and atraumatic.  Eyes: Conjunctivae are normal. Right eye exhibits no discharge. Left eye exhibits no discharge.  Neck: Neck supple.  Cardiovascular: Normal rate, regular rhythm and normal heart sounds.  Exam reveals no gallop and no friction rub.   No murmur heard. Pulmonary/Chest: Effort normal and breath sounds normal. No respiratory distress. He exhibits no tenderness.       Sternotomy scar  Abdominal: Soft. He exhibits no  distension and no mass. There is tenderness. There is no rebound and no guarding.       Mild tenderness in epigastric region without guarding or rebound.  Musculoskeletal: He exhibits no edema and no tenderness.  Neurological: He is alert.  Skin: Skin is warm and dry. He is not diaphoretic.  Psychiatric: He has a normal mood and affect. His behavior is normal. Thought content normal.    ED Course  Procedures (including critical care time)  Labs Reviewed  CBC - Abnormal; Notable for the following:    RBC 3.33 (*)    Hemoglobin 9.5 (*)    HCT 30.1 (*)    RDW 22.2 (*)    All other components within normal limits  BASIC METABOLIC PANEL - Abnormal; Notable for the following:    BUN 48 (*)    Creatinine, Ser 3.42 (*)    GFR calc non Af Amer 19 (*)    GFR calc Af Amer 23 (*)    All other components within normal limits  TROPONIN I  LIPASE, BLOOD   Dg Chest 2 View  02/19/2012  *RADIOLOGY REPORT*  Clinical Data: Chest pain and shortness of breath.  CHEST - 2 VIEW  Comparison: 02/02/2012  Findings: Stable postoperative changes in the mediastinum.  Mild cardiac enlargement with normal pulmonary vascularity, stable. Slight fibrosis in the lung bases.  No focal airspace consolidation.  No blunting of costophrenic angles.  No pneumothorax.  No significant change since previous study.  IMPRESSION: Stable cardiac enlargement.  No evidence of active pulmonary disease.  Original Report Authenticated By: Marlon Pel, M.D.   EKG:  Rhythm: Normal sinus rhythm Rate: 87 Axis: Normal Intervals/Conduction: Left ventricular hypertrophy ST segments: Nonspecific ST changes. There T wave inversions in V2. This is changed from patient's prior EKG from 02/02/2012   1. Chest pain   2. Abdominal pain       MDM  51 year old male with epigastric and chest pain. Suspect that this is GI related, likely associated with his history of gastroparesis. Patient does have EKG changes though. These changes  appear to be new from EKG from just 2 weeks ago. Troponin is normal. Chest x-ray does not show evidence of an acute process. Chronic renal failure with no acute change. Although patient's current symptoms are consistent with his chronic pain issues, his EKG changes cannot be overlooked. Will admit for further evaluation.  Hospitalst evaluated pt  in ED and does not feel that pt needs admitted and that current symptoms related to chronic pain issues. Although hx is consistent with that, his EKG is changed from previous and this was specifically discussed again with hospitalist. Pt says he would rather go. Does coincidentally already have a fu appointment with his cardiologist today. Return precautions discussed.        Raeford Razor, MD 02/21/12 2340

## 2012-02-19 NOTE — Assessment & Plan Note (Signed)
The patient has moderate mitral regurgitation following mitral valve repair. This is unchanged over serial evaluations. Unfortunately he continues to have class III symptoms. I suspect much of this is related to chronic kidney disease and uncontrolled hypertension. We'll continue his current diuretic therapy with furosemide 80 mg twice daily.

## 2012-02-19 NOTE — Assessment & Plan Note (Addendum)
The patient has chronic diastolic heart failure, New York Heart Association class III. This is secondary to malignant hypertension and valvular heart disease. Add hydralazine as above.

## 2012-02-19 NOTE — ED Notes (Signed)
Onset of chills, cough, left side CP at approx 01:00 this AM. Very difficult to keep pt awake to answer questions. Pt has taken Phenergan and Flexeril.

## 2012-02-19 NOTE — Consult Note (Signed)
Hospital Consultation Note Date: 02/19/2012  Patient name: Ryan Winters Medical record number: [id number] Date of birth: [birth year] Age: 51 y.o. Gender: male PCP: Jackie Plum, MD, MD  Attending physician: Raeford Razor, MD Emergency Contact:  Delonte Musich (wife) [phone] Code Status:  Full code  Chief Complaint: "Throwing up"  History of Present Illness: Ryan Winters is an 51 y.o. male with PMH of HTN, hyperlipidemia, class IV CHF and gastroparesis who presented to the hospital with a chief complaint of chest and epigastric pain and vomiting that started suddenly at 1:00 a.m.,  awakening him from sleep.  He also complained of abdominal bloating.  No sick contacts.  The patient was given a dose of Reglan in the ED which has improved his symptoms, and he has had no subsequent vomiting.  Normally takes Reglan but had run out of this medicine over a week ago, and has been unable to reach his PCP to authorize a refill.  No associated diarrhea. Denies current chest pain, and states the bloating has diminished with his moving his bowels this morning.  The ED physician was concerned about some T-wave inversions is V1, and asked for IM consultation to evaluate for the need to admit.    Past Medical History Past Medical History  Diagnosis Date  . Hypertension   . Bipolar disorder   . Schizophrenia     h/o hospitalized inpt therapy @ the [facility]  . 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 Hyperlipidemia   . Chronic kidney disease   . H/O: suicide attempt 1998    was hospitalized at [facility]  . Anemia     Hx of iron infusions  . CHF (congestive heart failure)     Hx of Class IV CHF  . Renal disorder   . Gastroparesis     Past Surgical History Past Surgical History  Procedure Date  . Mitral valve repair 2011  . Av fistula placement, radiocephalic 05/24/2011    Left arm  . Leg surgery      Meds: Prior to Admission medications   Medication Sig Start Date End Date Taking? Authorizing Provider  ARIPiprazole (ABILIFY) 5 MG tablet Take 5 mg by mouth daily.     Yes Historical Provider, MD  aspirin 81 MG EC tablet Take 81 mg by mouth daily.     Yes Historical Provider, MD  cyclobenzaprine (FLEXERIL) 5 MG tablet Take 5 mg by mouth 3 (three) times daily as needed. Muscle pain   Yes Historical Provider, MD  docusate sodium (COLACE) 100 MG capsule Take 100 mg by mouth daily as needed. For constipation   Yes Historical Provider, MD  fish oil-omega-3 fatty acids 1000 MG capsule Take 2 capsules (2 g total) by mouth daily. 10/03/11  Yes Altha Harm, MD  furosemide (LASIX) 80 MG tablet Take 80 mg by mouth 2 (two) times daily. 01/07/12  Yes Salem Senate, PA  hydrOXYzine (VISTARIL) 50 MG capsule Take 100 mg by mouth at bedtime.   Yes Historical Provider, MD  lisinopril (PRINIVIL,ZESTRIL) 20 MG tablet Take 20 mg by mouth daily.   Yes Historical Provider, MD  metoprolol (LOPRESSOR) 50 MG tablet Take 50 mg by mouth 2 (two) times daily.   Yes Historical Provider, MD  Multiple Vitamin (MULTIVITAMIN) tablet Take 1 tablet by mouth daily. 10/03/11  Yes Altha Harm, MD  nabumetone (RELAFEN) 500 MG tablet Take 500 mg by  mouth 2 (two) times daily.   Yes Historical Provider, MD  promethazine (PHENERGAN) 25 MG tablet Take 25 mg by mouth every 6 (six) hours as needed. Nausea   Yes Historical Provider, MD  vitamin C (ASCORBIC ACID) 500 MG tablet Take 1 tablet (500 mg total) by mouth daily. 10/03/11  Yes Altha Harm, MD    Allergies: Food  Social History: History   Social History  . Marital Status: Married    Spouse Name: N/A    Number of Children: N/A  . Years of Education: N/A   Occupational History  . disabled    Social History Main Topics  . Smoking status: Former Smoker -- 30 years    Types: Cigarettes    Quit date: 03/21/2011  . Smokeless tobacco:  Never Used  . Alcohol Use: No     Quit alcohol in July of 2011  . Drug Use: No     None since November when had rectal bleeding and went to hospital  . Sexually Active: No   Other Topics Concern  . Not on file   Social History Narrative   He lives at home with his wife and has a sister. He denies any cigs, drugs, alcohol at present.     Family History:  Family History  Problem Relation Age of Onset  . Hypertension Mother     Review of Systems: Constitutional: No fever, +chills;  Appetite fair; No weight loss.  HEENT: No blurry vision or diplopia, no pharyngitis or dysphagia CV: +chest pain, no arrhythmia.  Resp: + chronic SOB, + cough. GI: + N/V, no diarrhea, no melena or hematochezia.  GU: No dysuria or hematuria.  MSK: no myalgias/arthralgias.  Neuro:  No headache or focal neurological deficits.  Psych: No depression or anxiety.  Endo: No thyroid disease or DM.  Skin: No rashes or lesions.  Heme: No anemia or blood dyscrasia   Physical Exam: Blood pressure 144/112, pulse 97, temperature 97.7 F (36.5 C), temperature source Oral, resp. rate 16, SpO2 100.00%. BP 144/112  Pulse 97  Temp(Src) 97.7 F (36.5 C) (Oral)  Resp 16  SpO2 100%  General Appearance:    Alert, cooperative, no distress, appears stated age  Head:    Normocephalic, without obvious abnormality, atraumatic  Eyes:    PERRL, conjunctiva/corneas clear, EOM's intact  Ears:    Normal external ear canals, both ears  Nose:   Nares normal, septum midline, mucosa normal, no drainage    or sinus tenderness  Throat:   Lips, mucosa, and tongue normal; teeth and gums normal  Neck:   Supple, symmetrical, trachea midline, no adenopathy;       thyroid:  No enlargement/tenderness/nodules; no carotid   bruit or JVD  Back:     Symmetric, no curvature, ROM normal, no CVA tenderness  Lungs:     Clear to auscultation bilaterally, respirations unlabored  Chest wall:    No tenderness or deformity  Heart:    Regular rate and  rhythm, S1 and S2 normal, no murmur, rub   or gallop  Abdomen:     Soft, non-tender, bowel sounds active all four quadrants,    no masses, no organomegaly  Extremities:   Extremities normal, atraumatic, no cyanosis or edema  Pulses:   2+ and symmetric all extremities  Skin:   Skin color, texture, turgor normal, no rashes or lesions  Lymph nodes:   Cervical, supraclavicular normal  Neurologic:   Non-focal   Lab results: Basic Metabolic Panel:  Lab 02/19/12 0410  NA 137  K 4.7  CL 103  CO2 24  GLUCOSE 99  BUN 48*  CREATININE 3.42*  CALCIUM 8.8  MG --  PHOS --   GFR The CrCl is unknown because both a height and weight (above a minimum accepted value) are required for this calculation.  Lab 02/19/12 0410  LIPASE 52  AMYLASE --    CBC:  Lab 02/19/12 0410  WBC 8.1  NEUTROABS --  HGB 9.5*  HCT 30.1*  MCV 90.4  PLT 162   Cardiac Enzymes:  Lab 02/19/12 0410  CKTOTAL --  CKMB --  CKMBINDEX --  TROPONINI <0.30    Imaging results:  Dg Chest 2 View  02/19/2012  *RADIOLOGY REPORT*  Clinical Data: Chest pain and shortness of breath.  CHEST - 2 VIEW  Comparison: 02/02/2012  Findings: Stable postoperative changes in the mediastinum.  Mild cardiac enlargement with normal pulmonary vascularity, stable. Slight fibrosis in the lung bases.  No focal airspace consolidation.  No blunting of costophrenic angles.  No pneumothorax.  No significant change since previous study.  IMPRESSION: Stable cardiac enlargement.  No evidence of active pulmonary disease.  Original Report Authenticated By: Marlon Pel, M.D.    Assessment & Plan: Principal Problem:  *Gastroparesis /  Nausea and vomiting The patient's signs and symptoms are consistent with a flare of gastroparesis as a result of running out of Reglan.  Recommend restarting Reglan.  He reports that his symptoms are consistent with this, and feels better now that he has been given a dose of Reglan, with no further complaints of  bloating, chest / epigastric pain, or vomiting. Active Problems:  Acute on chronic diastolic heart failure The patient is well compensated clinically.  Chest radiographs are clear.  CKD (chronic kidney disease), stage IV The patient's creatinine is at baseline values.  F/U nephrology.  T wave inversion in EKG I have reviewed the EKG done today, and one from 02/05/12.  There are T wave inversions in V1 on today's EKG, and T wave flattening noted on the one from 02/05/12.  With a negative troponin, non-exertional type symptoms, and resolution of symptoms after treatment with Reglan, I think that these EKG changes are benign.  The patient has an appointment with his cardiologist today, who can review his EKGs to determine if any further work up is necessary.  Summary of recommendations:  1.  D/C the patient from the ER 2.  Provide a prescription for Reglan. 3. Have the patient follow up with his cardiologist this morning, at his regularly scheduled appointment time.  Time Spent On Consultation: 1 hour.  Ouita Nish 02/19/2012, 7:44 AM Pager ([phone]

## 2012-02-19 NOTE — ED Notes (Signed)
Woke from sleep with left sided chest pain, nausea and generalized weakness. Has vomited x 1 since arrival. Numbness in legs and lethargy.

## 2012-02-19 NOTE — Progress Notes (Signed)
HPI:  51 year old gentleman with mitral regurgitation status post mitral valve repair, congestive heart failure, and chronic kidney disease presented for followup evaluation. The patient has developed severe gastroparesis and he was actually just evaluated in the emergency department early this morning with abdominal discomfort and vomiting. He ran out of Reglan about a week ago and his symptoms have worsened since then. He was given Reglan and his symptoms improved. He was discharged after his emergency room evaluation.  The patient complains of dyspnea with exertion. He has chronic orthopnea. He denies PND or leg swelling. He describes New York Heart Association class III symptoms with dyspnea when he walks a short distance.  Outpatient Encounter Prescriptions as of 02/19/2012  Medication Sig Dispense Refill  . ARIPiprazole (ABILIFY) 5 MG tablet Take 5 mg by mouth daily.        Marland Kitchen aspirin 81 MG EC tablet Take 81 mg by mouth daily.        . cyclobenzaprine (FLEXERIL) 5 MG tablet Take 5 mg by mouth 3 (three) times daily as needed. Muscle pain      . docusate sodium (COLACE) 100 MG capsule Take 100 mg by mouth daily as needed. For constipation      . fish oil-omega-3 fatty acids 1000 MG capsule Take 2 capsules (2 g total) by mouth daily.  30 capsule  0  . furosemide (LASIX) 80 MG tablet Take 80 mg by mouth 2 (two) times daily.      . hydrOXYzine (VISTARIL) 50 MG capsule Take 100 mg by mouth at bedtime.      Marland Kitchen lisinopril (PRINIVIL,ZESTRIL) 20 MG tablet Take 20 mg by mouth daily.      . metoCLOPramide (REGLAN) 10 MG tablet Take 1 tablet (10 mg total) by mouth every 6 (six) hours.  30 tablet  0  . metoprolol (LOPRESSOR) 50 MG tablet Take 50 mg by mouth 2 (two) times daily.      . Multiple Vitamin (MULTIVITAMIN) tablet Take 1 tablet by mouth daily.  30 tablet  0  . promethazine (PHENERGAN) 25 MG tablet Take 25 mg by mouth every 6 (six) hours as needed. Nausea      . vitamin C (ASCORBIC ACID) 500 MG  tablet Take 1 tablet (500 mg total) by mouth daily.  30 tablet  0  . DISCONTD: nabumetone (RELAFEN) 500 MG tablet Take 500 mg by mouth 2 (two) times daily.      . hydrALAZINE (APRESOLINE) 25 MG tablet Take 1 tablet (25 mg total) by mouth 3 (three) times daily.  90 tablet  11  . metoCLOPramide (REGLAN) 10 MG tablet Take 1 tablet (10 mg total) by mouth every 6 (six) hours.  30 tablet  0   Facility-Administered Encounter Medications as of 02/19/2012  Medication Dose Route Frequency Provider Last Rate Last Dose  . aspirin chewable tablet 324 mg  324 mg Oral Once Raeford Razor, MD   324 mg at 02/19/12 0413  . gi cocktail (Maalox,Lidocaine,Donnatal)  30 mL Oral Once Raeford Razor, MD   30 mL at 02/19/12 0446  . metoCLOPramide (REGLAN) injection 10 mg  10 mg Intravenous Once Raeford Razor, MD   10 mg at 02/19/12 0503  . DISCONTD: 0.9 %  sodium chloride infusion   Intravenous Continuous Raeford Razor, MD      . DISCONTD: ondansetron [facility]) injection 4 mg  4 mg Intravenous Q8H PRN Raeford Razor, MD        Allergies  Allergen Reactions  . Food Swelling  Strawberries, Cashews    Past Medical History  Diagnosis Date  . Hypertension   . Bipolar disorder   . Schizophrenia     h/o hospitalized inpt therapy @ [facility]  . 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 Hyperlipidemia   . Chronic kidney disease   . H/O: suicide attempt 1998    was hospitalized at [facility]  . Anemia     Hx of iron infusions  . CHF (congestive heart failure)     Hx of Class IV CHF  . Renal disorder   . Gastroparesis     ROS: Negative except as per HPI  BP 120/90  Pulse 86  Ht 5\' 5"  (1.651 m)  Wt 70.67 kg (155 lb 12.8 oz)  BMI 25.93 kg/m2  PHYSICAL EXAM: Pt is alert and oriented, chronically ill-appearing male in NAD HEENT: normal Neck: JVP - normal, carotids 2+= with bilateral bruits Lungs: CTA bilaterally CV: RRR  with grade 3/6 holosystolic murmur at the LV apex Abd: soft, NT, decreased BS, no hepatomegaly Ext: no C/C/E, distal pulses intact and equal Skin: warm/dry no rash  EKG:  Normal sinus rhythm 97 beats per minute, left ventricular hypertrophy with nonspecific T-wave abnormality  ASSESSMENT AND PLAN:

## 2012-02-19 NOTE — Assessment & Plan Note (Signed)
Blood pressure control remains suboptimal. I'm not sure that the patient has adherent with his medical program considering all of his problems with gastroparesis. His wife is with him today and does support the fact that he takes his medications most of the time. I'm going to add hydralazine 12.5 mg 3 times daily and increase this to 25 mg 3 times daily after one week. Otherwise he should continue his current medications.

## 2012-02-19 NOTE — Discharge Instructions (Signed)
Abdominal Pain Abdominal pain can be caused by many things. Your caregiver decides the seriousness of your pain by an examination and possibly blood tests and X-rays. Many cases can be observed and treated at home. Most abdominal pain is not caused by a disease and will probably improve without treatment. However, in many cases, more time must pass before a clear cause of the pain can be found. Before that point, it may not be known if you need more testing, or if hospitalization or surgery is needed. HOME CARE INSTRUCTIONS   Do not take laxatives unless directed by your caregiver.   Take pain medicine only as directed by your caregiver.   Only take over-the-counter or prescription medicines for pain, discomfort, or fever as directed by your caregiver.   Try a clear liquid diet (broth, tea, or water) for as long as directed by your caregiver. Slowly move to a bland diet as tolerated.  SEEK IMMEDIATE MEDICAL CARE IF:   The pain does not go away.   You have a fever.   You keep throwing up (vomiting).   The pain is felt only in portions of the abdomen. Pain in the right side could possibly be appendicitis. In an adult, pain in the left lower portion of the abdomen could be colitis or diverticulitis.   You pass bloody or black tarry stools.  MAKE SURE YOU:   Understand these instructions.   Will watch your condition.   Will get help right away if you are not doing well or get worse.  Document Released: 08/15/2005 Document Revised: 10/25/2011 Document Reviewed: 06/23/2008 [facility] Patient Information 2012 [place], [place].Chest Pain (Nonspecific) It is often hard to give a specific diagnosis for the cause of chest pain. There is always a chance that your pain could be related to something serious, such as a heart attack or a blood clot in the lungs. You need to follow up with your caregiver for further evaluation. CAUSES   Heartburn.   Pneumonia or bronchitis.   Anxiety or stress.    Inflammation around your heart (pericarditis) or lung (pleuritis or pleurisy).   A blood clot in the lung.   A collapsed lung (pneumothorax). It can develop suddenly on its own (spontaneous pneumothorax) or from injury (trauma) to the chest.   Shingles infection (herpes zoster virus).  The chest wall is composed of bones, muscles, and cartilage. Any of these can be the source of the pain.  The bones can be bruised by injury.   The muscles or cartilage can be strained by coughing or overwork.   The cartilage can be affected by inflammation and become sore (costochondritis).  DIAGNOSIS  Lab tests or other studies, such as X-rays, electrocardiography, stress testing, or cardiac imaging, may be needed to find the cause of your pain.  TREATMENT   Treatment depends on what may be causing your chest pain. Treatment may include:   Acid blockers for heartburn.   Anti-inflammatory medicine.   Pain medicine for inflammatory conditions.   Antibiotics if an infection is present.   You may be advised to change lifestyle habits. This includes stopping smoking and avoiding alcohol, caffeine, and chocolate.   You may be advised to keep your head raised (elevated) when sleeping. This reduces the chance of acid going backward from your stomach into your esophagus.   Most of the time, nonspecific chest pain will improve within 2 to 3 days with rest and mild pain medicine.  HOME CARE INSTRUCTIONS   If antibiotics  were prescribed, take your antibiotics as directed. Finish them even if you start to feel better.   For the next few days, avoid physical activities that bring on chest pain. Continue physical activities as directed.   Do not smoke.   Avoid drinking alcohol.   Only take over-the-counter or prescription medicine for pain, discomfort, or fever as directed by your caregiver.   Follow your caregiver's suggestions for further testing if your chest pain does not go away.   Keep any  follow-up appointments you made. If you do not go to an appointment, you could develop lasting (chronic) problems with pain. If there is any problem keeping an appointment, you must call to reschedule.  SEEK MEDICAL CARE IF:   You think you are having problems from the medicine you are taking. Read your medicine instructions carefully.   Your chest pain does not go away, even after treatment.   You develop a rash with blisters on your chest.  SEEK IMMEDIATE MEDICAL CARE IF:   You have increased chest pain or pain that spreads to your arm, neck, jaw, back, or abdomen.   You develop shortness of breath, an increasing cough, or you are coughing up blood.   You have severe back or abdominal pain, feel nauseous, or vomit.   You develop severe weakness, fainting, or chills.   You have a fever.  THIS IS AN EMERGENCY. Do not wait to see if the pain will go away. Get medical help at once. Call your local emergency services (911 in [place] Do not drive yourself to the hospital. MAKE SURE YOU:   Understand these instructions.   Will watch your condition.   Will get help right away if you are not doing well or get worse.  Document Released: 08/15/2005 Document Revised: 10/25/2011 Document Reviewed: 06/10/2008 [facility] Patient Information 2012 [place], [place].  RESOURCE GUIDE  Dental Problems  Patients with Medicaid: [facility] [phone] [address]                                           [phone] [address] Phone:  [phone]                                                  Phone:  [phone]  If unable to pay or uninsured, contact:  [facility] or [facility]. to become qualified for the adult dental clinic.  Chronic Pain Problems Contact [facility]  [phone] Patients need to be referred by their primary care doctor.  Insufficient Money for Medicine Contact [employer] Way:  call "211" or [facility]  Ministry 786-015-4244.  No Primary Care Doctor Call [facility]  [phone] Other agencies that provide inexpensive medical care    [facility]  [phone]    [facility]  [phone]    [facility]  [phone]    [facility]  [phone]    [facility]  [phone]    [facility]  [phone]  Psychological Services [facility] Behavioral Health  [phone] [facility] Services  [phone] [facility] Mental  Health   [phone])[phone] (emergency services 519-581-3673)  Substance Abuse Resources [facility] Drug Services  908-188-1863 [facility] 757-578-7797 [facility] [place] 226-655-9720 [facility] 8705687546 Residential & Outpatient Substance Abuse Program  [phone]  Abuse/Neglect [facility] Child Abuse Hotline 641-714-4675 [facility] Child Abuse Hotline 469-443-6160 (After Hours)  Emergency Shelter [facility] 7167081651  Maternity Homes Room at the [place] [facility] [place] 612-081-1669 [facility] 234-712-0674  MRSA Hotline #:   256-136-2409    [facility] Resources  [facility] [place]     [employer]                          [facility] Dept. [address] [place]                       [address]      [address]  Blondell Reveal Phone:  [phone]                                   Phone:  [phone]                 Phone:  [phone]  [facility] Mental Health Phone:  [phone]  [facility] Child Abuse Hotline (347)078-8903 308-875-1426 (After Hours)

## 2012-02-19 NOTE — Patient Instructions (Signed)
Your physician wants you to follow-up in: 6 MONTHS with Dr Excell Seltzer.  You will receive a reminder letter in the mail two months in advance. If you don't receive a letter, please call our office to schedule the follow-up appointment.  Your physician has recommended you make the following change in your medication: STOP Relafen, START Hydralazine 25mg  take one-half tablet by mouth three times a day for 1 WEEK and then increase to 25mg  take one by mouth three times a day

## 2012-02-20 ENCOUNTER — Emergency Department (HOSPITAL_COMMUNITY): Payer: Medicaid Other

## 2012-02-20 ENCOUNTER — Emergency Department (HOSPITAL_COMMUNITY)
Admission: EM | Admit: 2012-02-20 | Discharge: 2012-02-21 | Disposition: A | Discharge status: Home / Self Care | Payer: Medicaid Other | Attending: Emergency Medicine | Admitting: Emergency Medicine

## 2012-02-20 ENCOUNTER — Encounter (HOSPITAL_COMMUNITY): Payer: Self-pay | Admitting: General Practice

## 2012-02-20 ENCOUNTER — Other Ambulatory Visit: Payer: Self-pay

## 2012-02-20 DIAGNOSIS — N189 Chronic kidney disease, unspecified: Secondary | ICD-10-CM | POA: Insufficient documentation

## 2012-02-20 DIAGNOSIS — I129 Hypertensive chronic kidney disease with stage 1 through stage 4 chronic kidney disease, or unspecified chronic kidney disease: Secondary | ICD-10-CM | POA: Insufficient documentation

## 2012-02-20 DIAGNOSIS — R0602 Shortness of breath: Secondary | ICD-10-CM | POA: Insufficient documentation

## 2012-02-20 DIAGNOSIS — Z79899 Other long term (current) drug therapy: Secondary | ICD-10-CM | POA: Insufficient documentation

## 2012-02-20 DIAGNOSIS — F1910 Other psychoactive substance abuse, uncomplicated: Secondary | ICD-10-CM | POA: Insufficient documentation

## 2012-02-20 DIAGNOSIS — R079 Chest pain, unspecified: Secondary | ICD-10-CM | POA: Insufficient documentation

## 2012-02-20 DIAGNOSIS — R109 Unspecified abdominal pain: Secondary | ICD-10-CM | POA: Insufficient documentation

## 2012-02-20 DIAGNOSIS — F1410 Cocaine abuse, uncomplicated: Secondary | ICD-10-CM | POA: Insufficient documentation

## 2012-02-20 DIAGNOSIS — R5381 Other malaise: Secondary | ICD-10-CM | POA: Insufficient documentation

## 2012-02-20 LAB — POCT I-STAT, CHEM 8
BUN: 50 mg/dL — ABNORMAL HIGH (ref 6–23)
Calcium, Ion: 1.22 mmol/L (ref 1.12–1.32)
Chloride: 107 mEq/L (ref 96–112)
Glucose, Bld: 98 mg/dL (ref 70–99)
TCO2: 23 mmol/L (ref 0–100)

## 2012-02-20 NOTE — ED Notes (Signed)
Pt family reports " stomach pain and shortness of breath for a week. Symptoms intensified the past 45 minutes". CK

## 2012-02-21 ENCOUNTER — Encounter (HOSPITAL_COMMUNITY): Payer: Self-pay | Admitting: Family Medicine

## 2012-02-21 ENCOUNTER — Inpatient Hospital Stay (HOSPITAL_COMMUNITY): Payer: Medicaid Other

## 2012-02-21 ENCOUNTER — Observation Stay (HOSPITAL_COMMUNITY)
Admission: AD | Admit: 2012-02-21 | Discharge: 2012-02-22 | Disposition: A | Discharge status: Home / Self Care | Payer: Medicaid Other | Source: Ambulatory Visit | Attending: Internal Medicine | Admitting: Internal Medicine

## 2012-02-21 DIAGNOSIS — Z79899 Other long term (current) drug therapy: Secondary | ICD-10-CM | POA: Insufficient documentation

## 2012-02-21 DIAGNOSIS — N184 Chronic kidney disease, stage 4 (severe): Secondary | ICD-10-CM | POA: Insufficient documentation

## 2012-02-21 DIAGNOSIS — Z9189 Other specified personal risk factors, not elsewhere classified: Secondary | ICD-10-CM

## 2012-02-21 DIAGNOSIS — I340 Nonrheumatic mitral (valve) insufficiency: Secondary | ICD-10-CM | POA: Diagnosis present

## 2012-02-21 DIAGNOSIS — R109 Unspecified abdominal pain: Secondary | ICD-10-CM | POA: Insufficient documentation

## 2012-02-21 DIAGNOSIS — I5032 Chronic diastolic (congestive) heart failure: Secondary | ICD-10-CM | POA: Insufficient documentation

## 2012-02-21 DIAGNOSIS — R112 Nausea with vomiting, unspecified: Principal | ICD-10-CM | POA: Insufficient documentation

## 2012-02-21 DIAGNOSIS — I059 Rheumatic mitral valve disease, unspecified: Secondary | ICD-10-CM | POA: Insufficient documentation

## 2012-02-21 DIAGNOSIS — K219 Gastro-esophageal reflux disease without esophagitis: Secondary | ICD-10-CM | POA: Diagnosis present

## 2012-02-21 DIAGNOSIS — D631 Anemia in chronic kidney disease: Secondary | ICD-10-CM | POA: Insufficient documentation

## 2012-02-21 DIAGNOSIS — F1910 Other psychoactive substance abuse, uncomplicated: Secondary | ICD-10-CM | POA: Diagnosis present

## 2012-02-21 DIAGNOSIS — R079 Chest pain, unspecified: Secondary | ICD-10-CM | POA: Insufficient documentation

## 2012-02-21 DIAGNOSIS — F209 Schizophrenia, unspecified: Secondary | ICD-10-CM | POA: Insufficient documentation

## 2012-02-21 DIAGNOSIS — K3184 Gastroparesis: Secondary | ICD-10-CM | POA: Insufficient documentation

## 2012-02-21 DIAGNOSIS — E785 Hyperlipidemia, unspecified: Secondary | ICD-10-CM | POA: Insufficient documentation

## 2012-02-21 DIAGNOSIS — I129 Hypertensive chronic kidney disease with stage 1 through stage 4 chronic kidney disease, or unspecified chronic kidney disease: Secondary | ICD-10-CM | POA: Insufficient documentation

## 2012-02-21 DIAGNOSIS — I509 Heart failure, unspecified: Secondary | ICD-10-CM | POA: Insufficient documentation

## 2012-02-21 DIAGNOSIS — I10 Essential (primary) hypertension: Secondary | ICD-10-CM

## 2012-02-21 DIAGNOSIS — R011 Cardiac murmur, unspecified: Secondary | ICD-10-CM | POA: Diagnosis present

## 2012-02-21 DIAGNOSIS — R197 Diarrhea, unspecified: Secondary | ICD-10-CM | POA: Insufficient documentation

## 2012-02-21 DIAGNOSIS — K439 Ventral hernia without obstruction or gangrene: Secondary | ICD-10-CM | POA: Insufficient documentation

## 2012-02-21 DIAGNOSIS — F319 Bipolar disorder, unspecified: Secondary | ICD-10-CM | POA: Diagnosis present

## 2012-02-21 DIAGNOSIS — F1410 Cocaine abuse, uncomplicated: Secondary | ICD-10-CM | POA: Insufficient documentation

## 2012-02-21 LAB — CBC
MCV: 89.6 fL (ref 78.0–100.0)
Platelets: 166 10*3/uL (ref 150–400)
RBC: 3.28 MIL/uL — ABNORMAL LOW (ref 4.22–5.81)
RDW: 22 % — ABNORMAL HIGH (ref 11.5–15.5)
WBC: 8.8 10*3/uL (ref 4.0–10.5)

## 2012-02-21 LAB — TROPONIN I
Troponin I: <0.3 ng/mL (ref <0.30)
Troponin I: <0.3 ng/mL (ref <0.30)

## 2012-02-21 LAB — DIFFERENTIAL
Eosinophils Absolute: 0.2 10*3/uL (ref 0.0–0.7)
Eosinophils Relative: 2 % (ref 0–5)
Lymphocytes Relative: 16 % (ref 12–46)
Monocytes Absolute: 0.7 10*3/uL (ref 0.1–1.0)
Monocytes Relative: 8 % (ref 3–12)
Neutro Abs: 6.4 10*3/uL (ref 1.7–7.7)
Neutrophils Relative %: 73 % (ref 43–77)

## 2012-02-21 LAB — COMPREHENSIVE METABOLIC PANEL
ALT: 25 U/L (ref 0–53)
CO2: 26 mEq/L (ref 19–32)
Calcium: 8.5 mg/dL (ref 8.4–10.5)
Creatinine, Ser: 3.83 mg/dL — ABNORMAL HIGH (ref 0.50–1.35)
GFR calc Af Amer: 20 mL/min — ABNORMAL LOW (ref >90)
GFR calc non Af Amer: 17 mL/min — ABNORMAL LOW (ref >90)
Glucose, Bld: 95 mg/dL (ref 70–99)
Sodium: 138 mEq/L (ref 135–145)
Total Protein: 6.2 g/dL (ref 6.0–8.3)

## 2012-02-21 LAB — URINALYSIS, ROUTINE W REFLEX MICROSCOPIC
Bilirubin Urine: NEGATIVE
Glucose, UA: NEGATIVE mg/dL
Ketones, ur: NEGATIVE mg/dL
Leukocytes, UA: NEGATIVE
Nitrite: NEGATIVE
Protein, ur: 100 mg/dL — AB
Specific Gravity, Urine: 1.024 (ref 1.005–1.030)
Urobilinogen, UA: 0.2 mg/dL (ref 0.0–1.0)
pH: 5 (ref 5.0–8.0)

## 2012-02-21 LAB — RAPID URINE DRUG SCREEN, HOSP PERFORMED
Amphetamines: NOT DETECTED
Barbiturates: POSITIVE — AB
Benzodiazepines: POSITIVE — AB
Cocaine: POSITIVE — AB
Opiates: NOT DETECTED
Tetrahydrocannabinol: NOT DETECTED

## 2012-02-21 LAB — URINE MICROSCOPIC-ADD ON

## 2012-02-21 LAB — CARDIAC PANEL(CRET KIN+CKTOT+MB+TROPI)
CK, MB: 3.3 ng/mL (ref 0.3–4.0)
Troponin I: <0.3 ng/mL (ref <0.30)

## 2012-02-21 LAB — PHOSPHORUS: Phosphorus: 3.9 mg/dL (ref 2.3–4.6)

## 2012-02-21 MED ORDER — SODIUM CHLORIDE 0.9 % IV SOLN: 250.0000 mL | INTRAVENOUS | Status: DC | PRN

## 2012-02-21 MED ORDER — FUROSEMIDE 80 MG PO TABS
80.0000 mg | ORAL_TABLET | Freq: Two times a day (BID) | ORAL | Status: DC
  Filled 2012-02-21: qty 1

## 2012-02-21 MED ORDER — HYDROXYZINE PAMOATE 50 MG PO CAPS
50.0000 mg | ORAL_CAPSULE | Freq: Every day | ORAL | Status: DC
  Administered 2012-02-21: 50 mg via ORAL
  Filled 2012-02-21 (×2): qty 1

## 2012-02-21 MED ORDER — CYCLOBENZAPRINE HCL 5 MG PO TABS
5.0000 mg | ORAL_TABLET | Freq: Three times a day (TID) | ORAL | Status: DC | PRN
  Filled 2012-02-21 (×2): qty 1

## 2012-02-21 MED ORDER — ONDANSETRON HCL 4 MG/2ML IJ SOLN: 4.0000 mg | Freq: Four times a day (QID) | INTRAMUSCULAR | Status: DC | PRN

## 2012-02-21 MED ORDER — LISINOPRIL 20 MG PO TABS
20.0000 mg | ORAL_TABLET | Freq: Every day | ORAL | Status: DC
  Administered 2012-02-22: 20 mg via ORAL
  Filled 2012-02-21: qty 1

## 2012-02-21 MED ORDER — SODIUM CHLORIDE 0.9 % IJ SOLN
3.0000 mL | Freq: Two times a day (BID) | INTRAMUSCULAR | Status: DC
  Administered 2012-02-21 – 2012-02-22 (×2): 3 mL via INTRAVENOUS

## 2012-02-21 MED ORDER — METOPROLOL TARTRATE 50 MG PO TABS
50.0000 mg | ORAL_TABLET | Freq: Two times a day (BID) | ORAL | Status: DC
  Administered 2012-02-21 – 2012-02-22 (×2): 50 mg via ORAL
  Filled 2012-02-21 (×3): qty 1

## 2012-02-21 MED ORDER — PANTOPRAZOLE SODIUM 40 MG PO TBEC
40.0000 mg | DELAYED_RELEASE_TABLET | Freq: Every day | ORAL | Status: DC
  Filled 2012-02-21: qty 1

## 2012-02-21 MED ORDER — SODIUM CHLORIDE 0.9 % IJ SOLN: 3.0000 mL | INTRAMUSCULAR | Status: DC | PRN

## 2012-02-21 MED ORDER — ASPIRIN EC 81 MG PO TBEC
81.0000 mg | DELAYED_RELEASE_TABLET | Freq: Every day | ORAL | Status: DC
  Administered 2012-02-22: 81 mg via ORAL
  Filled 2012-02-21: qty 1

## 2012-02-21 MED ORDER — ASPIRIN 81 MG PO TBEC: 81.0000 mg | DELAYED_RELEASE_TABLET | Freq: Every day | ORAL | Status: DC

## 2012-02-21 MED ORDER — METOCLOPRAMIDE HCL 5 MG PO TABS
5.0000 mg | ORAL_TABLET | Freq: Four times a day (QID) | ORAL | Status: DC
  Administered 2012-02-21 – 2012-02-22 (×2): 5 mg via ORAL
  Filled 2012-02-21 (×7): qty 1

## 2012-02-21 MED ORDER — DOCUSATE SODIUM 100 MG PO CAPS
100.0000 mg | ORAL_CAPSULE | Freq: Two times a day (BID) | ORAL | Status: DC
  Administered 2012-02-21 – 2012-02-22 (×2): 100 mg via ORAL
  Filled 2012-02-21 (×3): qty 1

## 2012-02-21 MED ORDER — HYDRALAZINE HCL 25 MG PO TABS
25.0000 mg | ORAL_TABLET | Freq: Three times a day (TID) | ORAL | Status: DC
  Administered 2012-02-21 – 2012-02-22 (×2): 25 mg via ORAL
  Filled 2012-02-21 (×4): qty 1

## 2012-02-21 MED ORDER — ARIPIPRAZOLE 5 MG PO TABS
5.0000 mg | ORAL_TABLET | Freq: Every day | ORAL | Status: DC
  Administered 2012-02-21 – 2012-02-22 (×2): 5 mg via ORAL
  Filled 2012-02-21 (×2): qty 1

## 2012-02-21 MED ORDER — ONDANSETRON HCL 4 MG PO TABS
4.0000 mg | ORAL_TABLET | Freq: Four times a day (QID) | ORAL | Status: DC | PRN
  Administered 2012-02-21: 4 mg via ORAL
  Filled 2012-02-21: qty 1

## 2012-02-21 NOTE — ED Provider Notes (Signed)
Medical screening examination/treatment/procedure(s) were performed by non-physician practitioner and as supervising physician I was immediately available for consultation/collaboration.   Lew Prout L Sherhonda Gaspar, MD 02/21/12 0701 

## 2012-02-21 NOTE — ED Notes (Signed)
Writer saw pt walking in hallway with family members to leave. Explained "if you are going to leave against medical advice you have to sign paper work." Pt continued to walk out the door.

## 2012-02-21 NOTE — Discharge Instructions (Signed)
Please review the instructions below. You need to stop using cocaine considering your significant health problems. Your EKG, cardiac enzymes, chest x-ray and other lab work is unchanged from yesterday. You need to arrange follow up with your primary care physician and cardiologist as previously instructed.    Abdominal Pain Abdominal pain can be caused by many things. Your caregiver decides the seriousness of your pain by an examination and possibly blood tests and X-rays. Many cases can be observed and treated at home. Most abdominal pain is not caused by a disease and will probably improve without treatment. However, in many cases, more time must pass before a clear cause of the pain can be found. Before that point, it may not be known if you need more testing, or if hospitalization or surgery is needed. HOME CARE INSTRUCTIONS   Do not take laxatives unless directed by your caregiver.   Take pain medicine only as directed by your caregiver.   Only take over-the-counter or prescription medicines for pain, discomfort, or fever as directed by your caregiver.   Try a clear liquid diet (broth, tea, or water) for as long as directed by your caregiver. Slowly move to a bland diet as tolerated.  SEEK IMMEDIATE MEDICAL CARE IF:   The pain does not go away.   You have a fever.   You keep throwing up (vomiting).   The pain is felt only in portions of the abdomen. Pain in the right side could possibly be appendicitis. In an adult, pain in the left lower portion of the abdomen could be colitis or diverticulitis.   You pass bloody or black tarry stools.  MAKE SURE YOU:   Understand these instructions.   Will watch your condition.   Will get help right away if you are not doing well or get worse.  Document Released: 08/15/2005 Document Revised: 10/25/2011 Document Reviewed: 06/23/2008 [facility] Patient Information 2012 [place], [place].Cocaine Abuse PROBLEMS FROM USING COCAINE:   Highly  addictive.   Illegal.   Risk of sudden death.   Heart disease.   Irregular heart beat.   High blood pressure.   Damage to nose and lungs.   Severe agitation.   Hallucinations.   Violent behavior.   Paranoia.   Sexual dysfunction.  Most cocaine users deny that they have a problem with addiction. The biggest problem is admitting that you are dependent on cocaine. Those trying to quit using it may experience depression and withdrawal symptoms. Other withdrawal symptoms include fatigue, suicidal thoughts, sleepiness, restlessness, anxiety, and increased craving for cocaine. There are medications available to help prevent depression associated with stopping cocaine. Most users will find a support group or treatment program helpful in coming off and staying off cocaine. The best chance to cure cocaine addiction is to go into group therapy and to be in a drug-free environment. It is very important to develop healthy relationships and avoid socializing with people who use or deal drugs. Eat well, and give your body the proper rest and healthy exercise it needs. You may need medication to help treat withdrawal symptoms. Call your caregiver or a drug treatment center for more help.  You may also want to call the [facility] at 800-662-HELP in the [place] SEEK IMMEDIATE MEDICAL CARE IF:  You develop severe chest pain.   You develop shortness of breath.   You develop extreme agitation.  Document Released: 12/13/2004 Document Revised: 10/25/2011 Document Reviewed: 09/07/2009 [facility] Patient Information 2012 [place], [place].Polysubstance Abuse When people  abuse more than one drug or type of drug it is called polysubstance or polydrug abuse. For example, many smokers also drink alcohol. This is one form of polydrug abuse. Polydrug abuse also refers to the use of a drug to counteract an unpleasant effect produced by another drug. It may also be used to help with withdrawal  from another drug. People who take stimulants may become agitated. Sometimes this agitation is countered with a tranquilizer. This helps protect against the unpleasant side effects. Polydrug abuse also refers to the use of different drugs at the same time.  Anytime drug use is interfering with normal living activities, it has become abuse. This includes problems with family and friends. Psychological dependence has developed when your mind tells you that the drug is needed. This is usually followed by physical dependence which has developed when continuing increases of drug are required to get the same feeling or "high". This is known as addiction or chemical dependency. A person's risk is much higher if there is a history of chemical dependency in the family. SIGNS OF CHEMICAL DEPENDENCY  You have been told by friends or family that drugs have become a problem.   You fight when using drugs.   You are having blackouts (not remembering what you do while using).   You feel sick from using drugs but continue using.   You lie about use or amounts of drugs (chemicals) used.   You need chemicals to get you going.   You are suffering in work performance or in school because of drug use.   You get sick from use of drugs but continue to use anyway.   You need drugs to relate to people or feel comfortable in social situations.   You use drugs to forget problems.  "Yes" answered to any of the above signs of chemical dependency indicates there are problems. The longer the use of drugs continues, the greater the problems will become. If there is a family history of drug or alcohol use, it is best not to experiment with these drugs. Continual use leads to tolerance. After tolerance develops more of the drug is needed to get the same feeling. This is followed by addiction. With addiction, drugs become the most important part of life. It becomes more important to take drugs than participate in the other  usual activities of life. This includes relating to friends and family. Addiction is followed by dependency. Dependency is a condition where drugs are now needed not just to get high, but to feel normal. Addiction cannot be cured but it can be stopped. This often requires outside help and the care of professionals. Treatment centers are listed in the yellow pages under: Cocaine, Narcotics, and Alcoholics Anonymous. Most hospitals and clinics can refer you to a specialized care center. Talk to your caregiver if you need help. Document Released: 06/27/2005 Document Revised: 10/25/2011 Document Reviewed: 11/05/2005 [facility] Patient Information 2012 [place], [place].Prescription Medications  Signs of Dependency Anytime medication use interferes with normal living activities, it may lead to abuse. This includes problems with family and friends. Psychological dependence may have developed when your mind tells you that the medication is needed. This is usually followed by physical dependence. This happens when continuing increases of medications are needed to get the same feeling or "high". This is known as addiction or chemical dependency. A person's risk is much higher if there is a history of chemical dependency in the family.  Even though the medication has been prescribed  by your doctor, he or she may not be aware that medications are becoming a problem for you. You would have to let them know. SIGNS OF DEPENDENCY  You are told by friends or family that medications have become a problem.   You take your medicines more than the amount prescribed by your caregiver.   You have blackouts (not remembering what you did while using).   You feel sick from using medications but continue to use anyway.   You lie about use or amounts of medications (chemicals) used.   You need chemicals to get you going.   Your work or school performance suffers due to drug use.   You need medications to relate to people or  feel comfortable in social situations.   You use medications to forget problems.  Answering "yes" to any of the above signs of chemical dependency indicates there are problems. The longer the use of medications continues, the greater the problems will become. If there is a family history of drug or alcohol use, it is best to be aware of possible problems with medications. Medication use may lead to physical tolerance. You then need to use more of the medication to get the same feeling. This is followed by addiction. So medications become the most important part of life. If it becomes more important to take your medications than to participate in the usual activities of life (including relating to friends and family), it is likely that your medications have become a problem. Addiction is followed by dependency. Your medications are now needed just to feel normal. If you have been prescribed a medication by your caregiver which you feel has become a problem, talk to your caregiver. Let him or her know what your feelings are. Your caregiver can help you and perhaps prescribe a medication which will not make you feel so dependent. Document Released: 09/28/2004 Document Revised: 10/25/2011 Document Reviewed: 11/03/2008 [facility] Patient Information 2012 [place], [place].Shortness of Breath Shortness of breath (dyspnea) is the feeling of uneasy breathing. Shortness of breath does not always mean that there is a life-threatening illness. However, shortness of breath requires immediate medical care. CAUSES  Causes for shortness of breath include:  Not enough oxygen in the air (as with high altitudes or with a smoke-filled room).   Short-term (acute) lung disease, including:   Infections such as pneumonia.   Fluid in the lungs, such as heart failure.   A blood clot in the lungs (pulmonary embolism).   Lasting (chronic) lung diseases.   Heart disease (heart attack, angina, heart failure, and others).    Low red blood cells (anemia).   Poor physical fitness. This can cause shortness of breath when you exercise.   Chest or back injuries or stiffness.   Being overweight (obese).   Anxiety. This can make you feel like you are not getting enough air.  DIAGNOSIS  Serious medical problems can usually be found during your physical exam. Many tests may also be done to determine why you are having shortness of breath. Tests include:  Chest X-rays.   Lung function tests.   Blood tests.   Electrocardiography.   Exercise testing.   A cardiac echo.   Imaging scans.  Your caregiver may not be able to find a cause for your shortness of breath after your exam. In this case, it is important to have a follow-up exam with your caregiver as directed.  HOME CARE INSTRUCTIONS   Do not smoke. Smoking is a common cause  of shortness of breath. Ask for help to stop smoking.   Avoid being around chemicals that may bother your breathing (paint fumes, dust).   Rest as needed. Slowly resume your usual activities.   If medicines were prescribed, take them as directed for the full length of time directed. This includes oxygen and any inhaled medicines.   Follow up with your caregiver as directed. Waiting to do so or failure to follow up could result in worsening of your condition and possible disability or death.   Be sure you understand what to do or who to call if your shortness of breath worsens.  SEEK MEDICAL CARE IF:   Your condition does not improve in the time expected.   You have a hard time doing your normal activities even with rest.   You have any side effects or problems with the medicines prescribed.   You develop any new symptoms.  SEEK IMMEDIATE MEDICAL CARE IF:   Your shortness of breath is getting worse.   You feel lightheaded, faint, or develop a cough not controlled with medicines.   You start coughing up blood.   You have pain with breathing.   You have chest pain or  pain in your arms, shoulders, or abdomen.   You have a fever.   You are unable to walk up stairs or exercise the way you normally do.   Your symptoms are getting worse.  Document Released: 07/31/2001 Document Revised: 10/25/2011 Document Reviewed: 03/17/2008 [facility] Patient Information 2012 [place], [place].

## 2012-02-21 NOTE — ED Provider Notes (Signed)
History     CSN: [id number]  Arrival date & time 02/20/12  2127   First MD Initiated Contact with Patient 02/20/12 2302      Chief Complaint  Patient presents with  . Shortness of Breath    Pt reports " started 45 minutes ago"   . Chest Pain    HPI:  Patient is a 51 y.o. male presenting with shortness of breath. The history is provided by the patient.  Shortness of Breath  The current episode started today. The problem has been resolved. The problem is mild. The symptoms are relieved by nothing. Associated symptoms include shortness of breath. Pertinent negatives include no chest pain, no fever, no cough and no wheezing.  Patient reports he has had intermittent shortness of breath and abdominal pain for greater than a week. States he awoke from a nap this evening and felt like he couldn't catch his breath. The shortness of breath has since resolved. Patient denies chest pain but does admit that he has been having intermittent periumbilical pain "for my hernia is". Patient appears very lethargic. When questioned about this he states it must be the Abilify that I took. Patient seen yesterday emesis, emergency department for same complaint, evaluation rendered no acute findings and patient was instructed to follow up with his cardiologist today which he did not.   Past Medical History  Diagnosis Date  . Hypertension   . Bipolar disorder   . Schizophrenia     h/o hospitalized inpt therapy @ [facility]  . 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 Hyperlipidemia   . Chronic kidney disease   . H/O: suicide attempt 1998    was hospitalized at [facility]  . Anemia     Hx of iron infusions  . CHF (congestive heart failure)     Hx of Class IV CHF  . Renal disorder   . Gastroparesis     Past Surgical History  Procedure Date  . Mitral valve repair 2011  . Av fistula placement, radiocephalic  05/24/2011    Left arm  . Leg surgery     Family History  Problem Relation Age of Onset  . Hypertension Mother     History  Substance Use Topics  . Smoking status: Former Smoker -- 30 years    Types: Cigarettes    Quit date: 03/21/2011  . Smokeless tobacco: Never Used  . Alcohol Use: No     Quit alcohol in July of 2011      Review of Systems  Constitutional: Negative.  Negative for fever.  HENT: Negative.   Eyes: Negative.   Respiratory: Positive for shortness of breath. Negative for cough and wheezing.   Cardiovascular: Negative.  Negative for chest pain.  Gastrointestinal: Negative.   Genitourinary: Negative.   Musculoskeletal: Negative.   Skin: Negative.   Neurological: Negative.   Hematological: Negative.   Psychiatric/Behavioral: Negative.     Allergies  Food  Home Medications   Current Outpatient Rx  Name Route Sig Dispense Refill  . ARIPIPRAZOLE 5 MG PO TABS Oral Take 5 mg by mouth daily.      . ASPIRIN 81 MG PO TBEC Oral Take 81 mg by mouth daily.      . CYCLOBENZAPRINE HCL 5 MG PO TABS Oral Take 5 mg by mouth 3 (three) times daily as needed. Muscle pain    . DOCUSATE SODIUM 100  MG PO CAPS Oral Take 100 mg by mouth daily as needed. For constipation    . FUROSEMIDE 80 MG PO TABS Oral Take 80 mg by mouth 2 (two) times daily.    Marland Kitchen HYDRALAZINE HCL 25 MG PO TABS Oral Take 1 tablet (25 mg total) by mouth 3 (three) times daily. 90 tablet 11  . HYDROXYZINE PAMOATE 50 MG PO CAPS Oral Take 100 mg by mouth at bedtime.    Marland Kitchen LISINOPRIL 20 MG PO TABS Oral Take 20 mg by mouth daily.    Marland Kitchen METOCLOPRAMIDE HCL 10 MG PO TABS Oral Take 1 tablet (10 mg total) by mouth every 6 (six) hours. 30 tablet 0  . METOPROLOL TARTRATE 50 MG PO TABS Oral Take 50 mg by mouth 2 (two) times daily.    Marland Kitchen PROMETHAZINE HCL 25 MG PO TABS Oral Take 25 mg by mouth every 6 (six) hours as needed. Nausea    . METOCLOPRAMIDE HCL 10 MG PO TABS Oral Take 1 tablet (10 mg total) by mouth every 6 (six)  hours. 30 tablet 0    BP 133/88  Pulse 77  Temp(Src) 97.5 F (36.4 C) (Oral)  Resp 16  Ht 5\' 5"  (1.651 m)  Wt 155 lb (70.308 kg)  BMI 25.79 kg/m2  SpO2 100%  Physical Exam  Constitutional: He appears well-developed and well-nourished. He appears lethargic.       Patient is arousable, oriented and can answer questions but otherwise appears very lethargic.  HENT:  Head: Normocephalic and atraumatic.  Eyes: Conjunctivae are normal.  Neck: Neck supple.  Cardiovascular: Normal rate and regular rhythm.   Pulmonary/Chest: Effort normal and breath sounds normal.  Abdominal: Soft. Bowel sounds are normal.  Musculoskeletal: Normal range of motion.  Neurological: He appears lethargic.  Skin: Skin is warm and dry.  Psychiatric: He has a normal mood and affect.    ED Course  Procedures    Date: 02/20/2012  Rate: 72  Rhythm: sinus rhythm  QRS Axis: normal  Intervals: normal  ST/T Wave abnormalities: normal  Conduction Disutrbances:none  Narrative Interpretation: LVH  Old EKG Reviewed: changes noted  Labs, EKG and chest x-ray unchanged from yesterday. Trop I tonight have been negative x2. Remaining labs and UA unchanged from yesterday. His UDS is positive for cocaine, benzodiazepines and barbiturates. Patient noted to be sleeping upon for several reassessment. The patient has had no further episodes of shortness of breath and now states his abdominal pain has resolved.  Findings and clinical impression discussed with patient. Patient is now much more awake and alert and states "the pill must born off". Discussed all of his testing results as well as his U. UDS indicating cocaine barbiturate and benzodiazepine use. Patient encouraged not to abuse cocaine, barbiturates or benzodiazepines. Will plan for discharge home and have patient followup with his PCP and cardiologist as previously instructed. Patient agreeable with plan.    Labs Reviewed  URINALYSIS, ROUTINE W REFLEX MICROSCOPIC  - Abnormal; Notable for the following:    Hgb urine dipstick MODERATE (*)    Protein, ur 100 (*)    All other components within normal limits  URINE RAPID DRUG SCREEN (HOSP PERFORMED) - Abnormal; Notable for the following:    Cocaine POSITIVE (*)    Benzodiazepines POSITIVE (*)    Barbiturates POSITIVE (*)    All other components within normal limits  POCT I-STAT, CHEM 8 - Abnormal; Notable for the following:    BUN 50 (*)    Creatinine, Ser 3.60 (*)  Hemoglobin 10.9 (*)    HCT 32.0 (*)    All other components within normal limits  URINE MICROSCOPIC-ADD ON  TROPONIN I  TROPONIN I   Dg Chest 2 View  02/20/2012  *RADIOLOGY REPORT*  Clinical Data: Shortness of breath and chest pain.  CHEST - 2 VIEW  Comparison: The chest x-ray 02/19/2012.  Findings: The heart is mildly enlarged but stable.  There are surgical changes from valve replacement surgery.  The lungs are clear.  No pleural effusion.  The bony thorax is intact.  IMPRESSION: Mild stable cardiac enlargement. No acute pulmonary findings.  Original Report Authenticated By: P. Loralie Champagne, M.D.   Dg Chest 2 View  02/19/2012  *RADIOLOGY REPORT*  Clinical Data: Chest pain and shortness of breath.  CHEST - 2 VIEW  Comparison: 02/02/2012  Findings: Stable postoperative changes in the mediastinum.  Mild cardiac enlargement with normal pulmonary vascularity, stable. Slight fibrosis in the lung bases.  No focal airspace consolidation.  No blunting of costophrenic angles.  No pneumothorax.  No significant change since previous study.  IMPRESSION: Stable cardiac enlargement.  No evidence of active pulmonary disease.  Original Report Authenticated By: Marlon Pel, M.D.     No diagnosis found.    MDM  HPI/PE and clinical findings c/w 1. SOB (resolved, EKG, chest x-ray and blood work unchanged from yesterday.) 2. Polysubstance abuse (UDS positive for cocaine, benzodiazepines and perpetuates. Discussed these results with patient and  his need to consider not abusing cocaine and other medications given his significant health problems.)        Leanne Chang, NP 02/21/12 [phone]

## 2012-02-21 NOTE — H&P (Signed)
History and Physical Examination  Date: 02/21/2012  Patient name: Ryan Winters Medical record number: [id number] Date of birth: [birth year] Age: 51 y.o. Gender: male PCP: Jackie Plum, MD, MD  Attending physician: Hollice Espy, MD  Chief Complaint: Intractible Nausea and vomiting (reported earlier today, currently resolved)   HISTORIAN: Pt and his wife (very poor historians)  History of Present Illness: Ryan Winters is an 51 y.o. male with a past medical history significant for stage 4 chronic kidney disease, stage III diastolic congestive heart failure, mitral valve prolapse, schizophrenia, bipolar disorder and chronic gastroparesis who presented to the emergency department several times in the last couple of days with multiple complaints of intractable nausea and vomiting, abdominal pain and shortness of breath.  He's had a workup that included a chest x-ray and blood work, he was sent home from the emergency department after being worked up yesterday.  He had also been sent home from the ED several days ago with complaints of nausea relieved after taking his prescribed metoclopramide.  He went to see his primary care physician today complaining of intractable nausea and vomiting.  I spoke with his PCP and he was concerned because the patient was having persistent complaints of nausea and vomiting at that time.  He recommended that the patient come to the ER for an admission.   When he was seen in the hospital room, his nausea and vomiting had resolved.  He is asking to eat his supper meal.  He reports that when he takes metoclopramide regularly the gastroparesis symptoms are very minimal.  He was found to have positive cocaine on urine drug screen today.  The patient has a history of substance abuse.  The patient is reporting now that he can eat [place] sandwiches and drink liquids with no difficulty.  He is concerned about a small ventral hernia that he reports causes him significant  abdominal pain.  He denies diarrhea.  He denies chest pain.  He does report that he has shortness of breath with walking up a small incline.  He reports that he cannot lie flat because of shortness of breath.  He reports that he has no fever or chills.  At this time, the patient is more concerned about his ventral hernia and in no apparent distress.    Past Medical History Past Medical History  Diagnosis Date  . Hypertension   . Bipolar disorder   . Schizophrenia     h/o hospitalized inpt therapy @ the [facility]  . 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 Hyperlipidemia   . Chronic kidney disease   . H/O: suicide attempt 1998    was hospitalized at [facility]  . Anemia     Hx of iron infusions  . CHF (congestive heart failure)     Hx of Class IV CHF  . Renal disorder   . Gastroparesis     Past Surgical History Past Surgical History  Procedure Date  . Mitral valve repair 2011  . Av fistula placement, radiocephalic 05/24/2011    Left arm  . Leg surgery     Home Meds: Prior to Admission medications   Medication Sig Start Date End Date Taking? Authorizing Provider  ARIPiprazole (ABILIFY) 5 MG tablet Take 5 mg by mouth daily.     Yes Historical Provider, MD  aspirin 81 MG EC tablet Take 81 mg by mouth daily.  Yes Historical Provider, MD  cyclobenzaprine (FLEXERIL) 5 MG tablet Take 5 mg by mouth 3 (three) times daily as needed. Muscle pain   Yes Historical Provider, MD  docusate sodium (COLACE) 100 MG capsule Take 100 mg by mouth daily as needed. For constipation   Yes Historical Provider, MD  furosemide (LASIX) 80 MG tablet Take 80 mg by mouth 2 (two) times daily. 01/07/12  Yes Salem Senate, PA  hydrALAZINE (APRESOLINE) 25 MG tablet Take 1 tablet (25 mg total) by mouth 3 (three) times daily. 02/19/12 02/18/13 Yes Tonny Bollman, MD  hydrOXYzine (VISTARIL) 50 MG capsule Take 50 mg by  mouth at bedtime.    Yes Historical Provider, MD  lisinopril (PRINIVIL,ZESTRIL) 20 MG tablet Take 20 mg by mouth daily.   Yes Historical Provider, MD  metoCLOPramide (REGLAN) 10 MG tablet Take 1 tablet (10 mg total) by mouth every 6 (six) hours. 02/19/12 02/29/12 Yes Raeford Razor, MD  metoprolol (LOPRESSOR) 50 MG tablet Take 50 mg by mouth 2 (two) times daily.   Yes Historical Provider, MD  promethazine (PHENERGAN) 25 MG tablet Take 25 mg by mouth every 6 (six) hours as needed. Nausea   Yes Historical Provider, MD  metoCLOPramide (REGLAN) 10 MG tablet Take 1 tablet (10 mg total) by mouth every 6 (six) hours. 01/03/12 01/13/12  Gwyneth Sprout, MD    Allergies: Food  Social History:  History   Social History  . Marital Status: Married    Spouse Name: N/A    Number of Children: N/A  . Years of Education: N/A   Occupational History  . disabled    Social History Main Topics  . Smoking status: Former Smoker -- 30 years    Types: Cigarettes    Quit date: 03/21/2011  . Smokeless tobacco: Never Used  . Alcohol Use: No     Quit alcohol in July of 2011  . Drug Use: No     None since November when had rectal bleeding and went to hospital  . Sexually Active: No   Other Topics Concern  . Not on file   Social History Narrative   He lives at home with his wife and has a sister. He denies any cigs, drugs, alcohol at present.    Family History:  Family History  Problem Relation Age of Onset  . Hypertension Mother     Review of Systems: Pertinent items are noted in HPI. All other systems reviewed and reported as negative.   Physical Exam: Blood pressure 132/89, pulse 89, temperature 97.7 F (36.5 C), temperature source Oral, resp. rate 18, height 5\' 6"  (1.676 m), weight 70.308 kg (155 lb), SpO2 100.00%. General appearance: alert, cooperative, appears stated age and no distress Head: Normocephalic, without obvious abnormality, atraumatic Eyes: negative Nose: Nares normal. Septum  midline. Mucosa normal. No drainage or sinus tenderness., no discharge Throat: lips, mucosa, and tongue normal; teeth and gums normal Neck: no adenopathy, no carotid bruit, no JVD, supple, symmetrical, trachea midline and thyroid not enlarged, symmetric, no tenderness/mass/nodules Lungs: clear to auscultation bilaterally Chest wall: no tenderness Heart: S1, S2 normal and systolic murmur: early systolic 3/6, blowing radiates to back Abdomen: soft, nondistended, small ventral hernia, reducible, mild TTP, normal BS, no guarding or rebound TTP Extremities: extremities normal, atraumatic, no cyanosis or edema Pulses: 2+ and symmetric Skin: Skin color, texture, turgor normal. No rashes or lesions Neurologic: Grossly normal  Lab  And Imaging results:  Results for orders placed during the hospital encounter of 02/20/12 (from the  past 24 hour(s))  URINALYSIS, ROUTINE W REFLEX MICROSCOPIC     Status: Abnormal   Collection Time   02/20/12 11:41 PM      Component Value Range   Color, Urine YELLOW  YELLOW    APPearance CLEAR  CLEAR    Specific Gravity, Urine 1.024  1.005 - 1.030    pH 5.0  5.0 - 8.0    Glucose, UA NEGATIVE  NEGATIVE (mg/dL)   Hgb urine dipstick MODERATE (*) NEGATIVE    Bilirubin Urine NEGATIVE  NEGATIVE    Ketones, ur NEGATIVE  NEGATIVE (mg/dL)   Protein, ur 956 (*) NEGATIVE (mg/dL)   Urobilinogen, UA 0.2  0.0 - 1.0 (mg/dL)   Nitrite NEGATIVE  NEGATIVE    Leukocytes, UA NEGATIVE  NEGATIVE   URINE RAPID DRUG SCREEN (HOSP PERFORMED)     Status: Abnormal   Collection Time   02/20/12 11:41 PM      Component Value Range   Opiates NONE DETECTED  NONE DETECTED    Cocaine POSITIVE (*) NONE DETECTED    Benzodiazepines POSITIVE (*) NONE DETECTED    Amphetamines NONE DETECTED  NONE DETECTED    Tetrahydrocannabinol NONE DETECTED  NONE DETECTED    Barbiturates POSITIVE (*) NONE DETECTED   URINE MICROSCOPIC-ADD ON     Status: Normal   Collection Time   02/20/12 11:41 PM      Component  Value Range   Squamous Epithelial / LPF RARE  RARE    WBC, UA 0-2  <3 (WBC/hpf)   RBC / HPF 0-2  <3 (RBC/hpf)  TROPONIN I     Status: Normal   Collection Time   02/20/12 11:43 PM      Component Value Range   Troponin I <0.30  <0.30 (ng/mL)  POCT I-STAT, CHEM 8     Status: Abnormal   Collection Time   02/20/12 11:49 PM      Component Value Range   Sodium 140  135 - 145 (mEq/L)   Potassium 4.5  3.5 - 5.1 (mEq/L)   Chloride 107  96 - 112 (mEq/L)   BUN 50 (*) 6 - 23 (mg/dL)   Creatinine, Ser 2.13 (*) 0.50 - 1.35 (mg/dL)   Glucose, Bld 98  70 - 99 (mg/dL)   Calcium, Ion 0.86  5.78 - 1.32 (mmol/L)   TCO2 23  0 - 100 (mmol/L)   Hemoglobin 10.9 (*) 13.0 - 17.0 (g/dL)   HCT 46.9 (*) 62.9 - 52.0 (%)  TROPONIN I     Status: Normal   Collection Time   02/21/12  1:52 AM      Component Value Range   Troponin I <0.30  <0.30 (ng/mL)   EKG Results:  Orders placed during the hospital encounter of 02/20/12  . ED EKG  . ED EKG  . EKG     Impression   Nausea and vomiting  Gastroparesis  SCHIZOPHRENIA  BIPOLAR AFFECTIVE DISORDER  HTN (hypertension), malignant  Mitral valve disorders  CARDIAC MURMUR  COCAINE ABUSE, HX OF  CKD (chronic kidney disease), stage IV  Mitral valvular regurgitation  GERD (gastroesophageal reflux disease)  Substance abuse  Chronic diastolic heart failure  Ventral hernia   Plan  The patient is currently not experiencing the intractable nausea and vomiting that concerned his primary care physician earlier today.  I'm going to admit the patient for observation, check some labs, check a chest x-ray and abdominal x-ray, resume his home medications for blood pressure, gastroparesis, provide when necessary nausea medications, provide  Protonix, DVT prophylaxis, resume regular diet, cycle cardiac enzymes, consider inpatient psychiatry consultation.  Please see orders.  Will make adjustments to care plan as required.    Standley Dakins MD [facility] [place], [place] [phone] 02/21/2012, 7:47 PM

## 2012-02-22 ENCOUNTER — Encounter (HOSPITAL_COMMUNITY): Payer: Self-pay | Admitting: *Deleted

## 2012-02-22 LAB — COMPREHENSIVE METABOLIC PANEL
BUN: 48 mg/dL — ABNORMAL HIGH (ref 6–23)
Calcium: 8.5 mg/dL (ref 8.4–10.5)
GFR calc Af Amer: 21 mL/min — ABNORMAL LOW (ref >90)
Glucose, Bld: 152 mg/dL — ABNORMAL HIGH (ref 70–99)
Total Protein: 6.4 g/dL (ref 6.0–8.3)

## 2012-02-22 LAB — CBC
HCT: 30.5 % — ABNORMAL LOW (ref 39.0–52.0)
Hemoglobin: 9.8 g/dL — ABNORMAL LOW (ref 13.0–17.0)
RDW: 21.9 % — ABNORMAL HIGH (ref 11.5–15.5)
WBC: 8.6 10*3/uL (ref 4.0–10.5)

## 2012-02-22 LAB — CARDIAC PANEL(CRET KIN+CKTOT+MB+TROPI): CK, MB: 4 ng/mL (ref 0.3–4.0)

## 2012-02-22 NOTE — Progress Notes (Signed)
   CARE MANAGEMENT NOTE 02/22/2012  Patient:  Ryan Winters, Ryan Winters   Account Number:  [id number]  Date Initiated:  02/22/2012  Documentation initiated by:  Lanier Clam  Subjective/Objective Assessment:   ADMITTED W/N/V.+COCAINE     Action/Plan:   FROM HOME   Anticipated DC Date:  02/22/2012   Anticipated DC Plan:  HOME/SELF CARE         Choice offered to / List presented to:             Status of service:  Completed, signed off Medicare Important Message given?   (If response is "NO", the following Medicare IM given date fields will be blank) Date Medicare IM given:   Date Additional Medicare IM given:    Discharge Disposition:  HOME/SELF CARE  Per UR Regulation:  Reviewed for med. necessity/level of care/duration of stay  If discussed at Long Length of Stay Meetings, dates discussed:    Comments:  02/22/12 [facility] RN,BSN [phone] [phone]

## 2012-02-22 NOTE — Plan of Care (Signed)
Problem: Phase I Progression Outcomes Goal: OOB as tolerated unless otherwise ordered Outcome: Completed/Met Date Met:  02/22/12 Ambulated in hallway x2 last night

## 2012-02-22 NOTE — Discharge Summary (Signed)
DISCHARGE SUMMARY  Ryan Winters  MR#: [id number]  DOB:01/06/1961  Date of Admission: 02/21/2012 Date of Discharge: 02/22/2012  Attending Physician:Roldan Laforest K  Patient's JXB:JYNW-GNFAO,ZHYQMV, MD, MD  Consults: -none  Discharge Diagnoses: Present on Admission:  .SCHIZOPHRENIA .Substance abuse .Nausea and vomiting .Mitral valvular regurgitation .Mitral valve disorders .HTN (hypertension), malignant .GERD (gastroesophageal reflux disease) .Gastroparesis .CKD (chronic kidney disease), stage IV .Chronic diastolic heart failure .BIPOLAR AFFECTIVE DISORDER .Ventral hernia .CARDIAC MURMUR Cocaine abuse  Initial presentation: Patient was a direct admission from his PCPs office, admitted for intractable nausea and vomiting. Upon arrival to the floor, his symptoms appear to have resolved. He was monitored overnight given concern for possibility of cocaine abuse related issues and possible hernia related nausea and vomiting  Hospital Course: Patient Active Problem List  Diagnoses  . Anemia in chronic kidney disease: Stable. His renal function upon time of discharge was improved to 3.58 which is close to his baseline.   Marland Kitchen SCHIZOPHRENIA: Stable. Continue home meds.   Marland Kitchen BIPOLAR AFFECTIVE DISORDER: Stable. Continue home meds.   Marland Kitchen HTN (hypertension), malignant : Stable. Continue home by mouth medications.         .   . CARDIAC MURMUR  . COCAINE ABUSE, HX OF: Counseled.      . Abdominal pain: Patient felt like this was related to his ventral hernia. He currently is tolerating by mouth just fine. Unable to easily reduce his hernia. Have provided him contact information for setting up outpatient appointment with the [facility] surgery.   . CKD (chronic kidney disease), stage IV  . Mitral valvular regurgitation  . Ascites     . Chest pain  . GERD (gastroesophageal reflux disease)  . Diarrhea  . Substance abuse     . Nausea and vomiting: Unclear etiology. Result.     .   .   . Chronic diastolic heart failure: Stable.   . Ventral hernia: See above.   :   Medication List  As of 02/22/2012  3:53 PM   TAKE these medications         ABILIFY 5 MG tablet   Generic drug: ARIPiprazole   Take 5 mg by mouth daily.      aspirin 81 MG EC tablet   Take 81 mg by mouth daily.      cyclobenzaprine 5 MG tablet   Commonly known as: FLEXERIL   Take 5 mg by mouth 3 (three) times daily as needed. Muscle pain      docusate sodium 100 MG capsule   Commonly known as: COLACE   Take 100 mg by mouth daily as needed. For constipation      furosemide 80 MG tablet   Commonly known as: LASIX   Take 80 mg by mouth 2 (two) times daily.      hydrALAZINE 25 MG tablet   Commonly known as: APRESOLINE   Take 1 tablet (25 mg total) by mouth 3 (three) times daily.      hydrOXYzine 50 MG capsule   Commonly known as: VISTARIL   Take 50 mg by mouth at bedtime.      lisinopril 20 MG tablet   Commonly known as: PRINIVIL,ZESTRIL   Take 20 mg by mouth daily.      metoCLOPramide 10 MG tablet   Commonly known as: REGLAN   Take 1 tablet (10 mg total) by mouth every 6 (six) hours.      metoprolol 50 MG tablet   Commonly known as: LOPRESSOR   Take 50  mg by mouth 2 (two) times daily.      promethazine 25 MG tablet   Commonly known as: PHENERGAN   Take 25 mg by mouth every 6 (six) hours as needed. Nausea             Day of Discharge BP 119/74  Pulse 89  Temp(Src) 97.9 F (36.6 C) (Oral)  Resp 16  Ht 5\' 6"  (1.676 m)  Wt 70.308 kg (155 lb)  BMI 25.02 kg/m2  SpO2 100%  Physical Exam: General: Alert and oriented x3, no acute distress, appears about stated age HEENT: Normocephalic, atraumatic, mucous membranes are moist Cardiovascular: Regular rate and rhythm, S1-S2, soft 2/6 systolic ejection murmur Lungs: Clear auscultation bilaterally Abdomen: Nontender, nondistended, hypoactive bowel sounds, reducible ventral hernia Extremities: No clubbing or cyanosis or  edema  Results for orders placed during the hospital encounter of 02/21/12 (from the past 24 hour(s))  CARDIAC PANEL(CRET KIN+CKTOT+MB+TROPI)     Status: Normal   Collection Time   02/21/12  9:16 PM      Component Value Range   Total CK 175  7 - 232 (U/L)   CK, MB 3.3  0.3 - 4.0 (ng/mL)   Troponin I <0.30  <0.30 (ng/mL)   Relative Index 1.9  0.0 - 2.5   COMPREHENSIVE METABOLIC PANEL     Status: Abnormal   Collection Time   02/21/12  9:18 PM      Component Value Range   Sodium 138  135 - 145 (mEq/L)   Potassium 4.3  3.5 - 5.1 (mEq/L)   Chloride 103  96 - 112 (mEq/L)   CO2 26  19 - 32 (mEq/L)   Glucose, Bld 95  70 - 99 (mg/dL)   BUN 52 (*) 6 - 23 (mg/dL)   Creatinine, Ser 1.61 (*) 0.50 - 1.35 (mg/dL)   Calcium 8.5  8.4 - 09.6 (mg/dL)   Total Protein 6.2  6.0 - 8.3 (g/dL)   Albumin 2.9 (*) 3.5 - 5.2 (g/dL)   AST 44 (*) 0 - 37 (U/L)   ALT 25  0 - 53 (U/L)   Alkaline Phosphatase 126 (*) 39 - 117 (U/L)   Total Bilirubin 1.1  0.3 - 1.2 (mg/dL)   GFR calc non Af Amer 17 (*) >90 (mL/min)   GFR calc Af Amer 20 (*) >90 (mL/min)  MAGNESIUM     Status: Normal   Collection Time   02/21/12  9:18 PM      Component Value Range   Magnesium 2.2  1.5 - 2.5 (mg/dL)  PHOSPHORUS     Status: Normal   Collection Time   02/21/12  9:18 PM      Component Value Range   Phosphorus 3.9  2.3 - 4.6 (mg/dL)  CBC     Status: Abnormal   Collection Time   02/21/12  9:18 PM      Component Value Range   WBC 8.8  4.0 - 10.5 (K/uL)   RBC 3.28 (*) 4.22 - 5.81 (MIL/uL)   Hemoglobin 9.5 (*) 13.0 - 17.0 (g/dL)   HCT 04.5 (*) 40.9 - 52.0 (%)   MCV 89.6  78.0 - 100.0 (fL)   MCH 29.0  26.0 - 34.0 (pg)   MCHC 32.3  30.0 - 36.0 (g/dL)   RDW 81.1 (*) 91.4 - 15.5 (%)   Platelets 166  150 - 400 (K/uL)  DIFFERENTIAL     Status: Normal   Collection Time   02/21/12  9:18 PM  Component Value Range   Neutrophils Relative 73  43 - 77 (%)   Lymphocytes Relative 16  12 - 46 (%)   Monocytes Relative 8  3 - 12 (%)    Eosinophils Relative 2  0 - 5 (%)   Basophils Relative 1  0 - 1 (%)   Neutro Abs 6.4  1.7 - 7.7 (K/uL)   Lymphs Abs 1.4  0.7 - 4.0 (K/uL)   Monocytes Absolute 0.7  0.1 - 1.0 (K/uL)   Eosinophils Absolute 0.2  0.0 - 0.7 (K/uL)   Basophils Absolute 0.1  0.0 - 0.1 (K/uL)   RBC Morphology POLYCHROMASIA PRESENT    TSH     Status: Normal   Collection Time   02/21/12  9:18 PM      Component Value Range   TSH 2.297  0.350 - 4.500 (uIU/mL)  COMPREHENSIVE METABOLIC PANEL     Status: Abnormal   Collection Time   02/22/12  3:51 AM      Component Value Range   Sodium 134 (*) 135 - 145 (mEq/L)   Potassium 4.0  3.5 - 5.1 (mEq/L)   Chloride 101  96 - 112 (mEq/L)   CO2 22  19 - 32 (mEq/L)   Glucose, Bld 152 (*) 70 - 99 (mg/dL)   BUN 48 (*) 6 - 23 (mg/dL)   Creatinine, Ser 1.61 (*) 0.50 - 1.35 (mg/dL)   Calcium 8.5  8.4 - 09.6 (mg/dL)   Total Protein 6.4  6.0 - 8.3 (g/dL)   Albumin 2.8 (*) 3.5 - 5.2 (g/dL)   AST 43 (*) 0 - 37 (U/L)   ALT 25  0 - 53 (U/L)   Alkaline Phosphatase 138 (*) 39 - 117 (U/L)   Total Bilirubin 1.1  0.3 - 1.2 (mg/dL)   GFR calc non Af Amer 18 (*) >90 (mL/min)   GFR calc Af Amer 21 (*) >90 (mL/min)  CBC     Status: Abnormal   Collection Time   02/22/12  3:51 AM      Component Value Range   WBC 8.6  4.0 - 10.5 (K/uL)   RBC 3.37 (*) 4.22 - 5.81 (MIL/uL)   Hemoglobin 9.8 (*) 13.0 - 17.0 (g/dL)   HCT 04.5 (*) 40.9 - 52.0 (%)   MCV 90.5  78.0 - 100.0 (fL)   MCH 29.1  26.0 - 34.0 (pg)   MCHC 32.1  30.0 - 36.0 (g/dL)   RDW 81.1 (*) 91.4 - 15.5 (%)   Platelets 194  150 - 400 (K/uL)  CARDIAC PANEL(CRET KIN+CKTOT+MB+TROPI)     Status: Normal   Collection Time   02/22/12  3:51 AM      Component Value Range   Total CK 188  7 - 232 (U/L)   CK, MB 4.0  0.3 - 4.0 (ng/mL)   Troponin I <0.30  <0.30 (ng/mL)   Relative Index 2.1  0.0 - 2.5     Disposition: Improved, being discharged home   Follow-up Appts: Discharge Orders    Future Appointments: Provider: Department: Dept  Phone: Center:   02/27/2012 11:00 AM Ardeth Sportsman, MD [facility] [phone] None     Future Orders Please Complete By Expires   Diet - low sodium heart healthy      Increase activity slowly         Follow-up Information    Follow up with [facility]. (Dr.Osei-Bonsu needs to call to make referral)       Follow up with OSEI-BONSU,GEORGE,  MD in 1 month. (As needed)    Contact information:   [address], [address] [place] [address] [phone]          Tests Needing Follow-up: None. His PCP will need to make him a referral to Gen. surgery.  Time spent in discharge (includes decision making & examination of pt): 30 minutes  Signed: Hollice Espy 02/22/2012, 3:53 PM

## 2012-02-22 NOTE — Discharge Instructions (Signed)
Hernia  A hernia occurs when an internal organ pushes out through a weak spot in the abdominal wall. Hernias most commonly occur in the groin and around the navel. Hernias often can be pushed back into place (reduced). Most hernias tend to get worse over time. Some abdominal hernias can get stuck in the opening (irreducible or incarcerated hernia) and cannot be reduced. An irreducible abdominal hernia which is tightly squeezed into the opening is at risk for impaired blood supply (strangulated hernia). A strangulated hernia is a medical emergency. Because of the risk for an irreducible or strangulated hernia, surgery may be recommended to repair a hernia.  CAUSES    Heavy lifting.   Prolonged coughing.   Straining to have a bowel movement.   A cut (incision) made during an abdominal surgery.  HOME CARE INSTRUCTIONS    Bed rest is not required. You may continue your normal activities.   Avoid lifting more than 10 pounds (4.5 kg) or straining.   Cough gently. If you are a smoker it is best to stop. Even the best hernia repair can break down with the continual strain of coughing. Even if you do not have your hernia repaired, a cough will continue to aggravate the problem.   Do not wear anything tight over your hernia. Do not try to keep it in with an outside bandage or truss. These can damage abdominal contents if they are trapped within the hernia sac.   Eat a normal diet.   Avoid constipation. Straining over long periods of time will increase hernia size and encourage breakdown of repairs. If you cannot do this with diet alone, stool softeners may be used.  SEEK IMMEDIATE MEDICAL CARE IF:    You have a fever.   You develop increasing abdominal pain.   You feel nauseous or vomit.   Your hernia is stuck outside the abdomen, looks discolored, feels hard, or is tender.   You have any changes in your bowel habits or in the hernia that are unusual for you.   You have increased pain or swelling around the  hernia.   You cannot push the hernia back in place by applying gentle pressure while lying down.  MAKE SURE YOU:    Understand these instructions.   Will watch your condition.   Will get help right away if you are not doing well or get worse.  Document Released: 11/05/2005 Document Revised: 10/25/2011 Document Reviewed: 06/24/2008  [employer] Patient Information 2012 [employer].

## 2012-02-22 NOTE — Plan of Care (Signed)
Problem: Phase III Progression Outcomes Goal: Activity at appropriate level-compared to baseline (UP IN CHAIR FOR HEMODIALYSIS) Outcome: Completed/Met Date Met:  02/22/12 Ambulating without difficulty, no SOB observed

## 2012-02-27 ENCOUNTER — Ambulatory Visit (INDEPENDENT_AMBULATORY_CARE_PROVIDER_SITE_OTHER): Payer: Self-pay | Admitting: Surgery

## 2012-03-25 ENCOUNTER — Ambulatory Visit (INDEPENDENT_AMBULATORY_CARE_PROVIDER_SITE_OTHER): Payer: Self-pay | Admitting: Surgery

## 2012-03-25 ENCOUNTER — Encounter (HOSPITAL_COMMUNITY): Payer: Self-pay | Admitting: Pharmacy Technician

## 2012-03-25 ENCOUNTER — Encounter (INDEPENDENT_AMBULATORY_CARE_PROVIDER_SITE_OTHER): Payer: Self-pay | Admitting: Surgery

## 2012-03-25 VITALS — BP 128/80 | HR 78 | Temp 96.7°F | Resp 14 | Ht 65.0 in | Wt 149.1 lb

## 2012-03-25 DIAGNOSIS — R112 Nausea with vomiting, unspecified: Secondary | ICD-10-CM | POA: Insufficient documentation

## 2012-03-25 DIAGNOSIS — K439 Ventral hernia without obstruction or gangrene: Secondary | ICD-10-CM

## 2012-03-25 DIAGNOSIS — K3184 Gastroparesis: Secondary | ICD-10-CM

## 2012-03-25 DIAGNOSIS — F209 Schizophrenia, unspecified: Secondary | ICD-10-CM | POA: Insufficient documentation

## 2012-03-25 MED ORDER — OMEPRAZOLE 40 MG PO CPDR: 40.0000 mg | DELAYED_RELEASE_CAPSULE | Freq: Two times a day (BID) | ORAL | Status: DC

## 2012-03-25 MED ORDER — METOCLOPRAMIDE HCL 10 MG PO TABS: 10.0000 mg | ORAL_TABLET | Freq: Three times a day (TID) | ORAL | Status: DC

## 2012-03-25 NOTE — Progress Notes (Addendum)
Subjective:     Patient ID: Ryan Winters, male   DOB: November 17, 1961, 51 y.o.   MRN: [id number]  HPI  Ryan Winters  26-Mar-1961 [id number]  Patient Care Team: Jackie Plum, MD as PCP - General (Internal Medicine)  This patient is a 51 y.o.male who presents today for surgical evaluation at the request of Dr. Cloyd Stagers.   Reason for visit: Nausea vomiting abdominal pain and subumbilical hernia.  Patient is a 51 year old male. History of cocaine and substance abuse. Has had cardiac surgery. Kidney dysfunction & hypertensive.  He struggles with nausea vomiting. Has heartburn. Occasionally uses TUMS. Cannot lie down flat secondary to difficulty breathing and reflux. Was told he has gastroparesis. Try to switch to smaller meals.  He noticed a lump above his bellybutton about 9 months ago. It is sensitive. There is concern that perhaps this is contributing to his nausea and vomiting. He was seen in the hospital last month. He was sent to [place] for surgical evaluation.  Patient notes she can walk about a half hour without chest pain as long as he goes slowly and steadily. A lot of complaints of joint aches and discomfort but has been stable. He's down to just a few cigarettes a day. Is trying to get off cocaine but it has been a challenge. Has bipolar disorder and schizophrenia as well. No major psych readmissions or suicidal attempts.  No skin infections.  Patient Active Problem List  Diagnoses  . Anemia in chronic kidney disease  . SCHIZOPHRENIA  . BIPOLAR AFFECTIVE DISORDER  . HTN (hypertension), malignant  . Mitral valve disorders  . Acute on chronic diastolic heart failure  . HEMATURIA UNSPECIFIED  . CARDIAC MURMUR  . COCAINE ABUSE, HX OF  . Rectal bleeding  . Abdominal pain  . CKD (chronic kidney disease), stage IV  . Mitral valvular regurgitation  . Ascites  . Constipation  . Chest pain  . GERD (gastroesophageal reflux disease)  . Substance abuse  . Atypical chest pain  .  Gastroparesis  . T wave inversion in EKG  . Suprumbilical hernia  . Schizophrenia  . CHF (congestive heart failure)  . N&V (nausea and vomiting)    Past Medical History  Diagnosis Date  . Hypertension   . Bipolar disorder   . Schizophrenia     h/o hospitalized inpt therapy @ the [facility]  . 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 Hyperlipidemia   . Chronic kidney disease   . H/O: suicide attempt 1998    was hospitalized at [facility]  . Anemia     Hx of iron infusions  . CHF (congestive heart failure)     Hx of Class IV CHF  . Gastroparesis   . Chest pain   . Abdominal pain   . Blood in stool   . N&V (nausea and vomiting)   . Generalized headaches     sinus  . Weakness     Past Surgical History  Procedure Date  . Mitral valve repair 05/2010  . Av fistula placement, radiocephalic 05/24/2011    Left arm  . Leg surgery     History   Social History  . Marital Status: Married    Spouse Name: N/A    Number of Children: N/A  . Years of Education: N/A   Occupational History  . disabled    Social History Main Topics  . Smoking status:  Former Smoker -- 30 years    Types: Cigarettes    Quit date: 03/21/2011  . Smokeless tobacco: Never Used  . Alcohol Use: No     Quit alcohol in July of 2011  . Drug Use: Yes    Special: Cocaine, Marijuana     None since November when had rectal bleeding and went to hospital  . Sexually Active: No   Other Topics Concern  . Not on file   Social History Narrative   He lives at home with his wife and has a sister. He denies any cigs, drugs, alcohol at present.     Family History  Problem Relation Age of Onset  . Hypertension Mother     Current Outpatient Prescriptions  Medication Sig Dispense Refill  . Docusate Calcium (STOOL SOFTENER PO) Take by mouth as needed. OTC      . ARIPiprazole (ABILIFY) 5 MG tablet Take 5 mg by mouth daily.         Marland Kitchen aspirin 81 MG EC tablet Take 81 mg by mouth daily.        . cyclobenzaprine (FLEXERIL) 5 MG tablet Take 5 mg by mouth 3 (three) times daily as needed. Muscle pain      . docusate sodium (COLACE) 100 MG capsule Take 100 mg by mouth daily as needed. For constipation      . furosemide (LASIX) 80 MG tablet Take 80 mg by mouth 2 (two) times daily.      . hydrALAZINE (APRESOLINE) 25 MG tablet Take 1 tablet (25 mg total) by mouth 3 (three) times daily.  90 tablet  11  . hydrOXYzine (VISTARIL) 50 MG capsule Take 50 mg by mouth at bedtime.       Marland Kitchen lisinopril (PRINIVIL,ZESTRIL) 20 MG tablet Take 20 mg by mouth daily.      . metoCLOPramide (REGLAN) 10 MG tablet Take 1 tablet (10 mg total) by mouth 4 (four) times daily -  before meals and at bedtime.  60 tablet  5  . metoprolol (LOPRESSOR) 50 MG tablet Take 50 mg by mouth 2 (two) times daily.      Marland Kitchen omeprazole (PRILOSEC) 40 MG capsule Take 1 capsule (40 mg total) by mouth 2 (two) times daily.  60 capsule  5  . promethazine (PHENERGAN) 25 MG tablet Take 25 mg by mouth every 6 (six) hours as needed. Nausea      . DISCONTD: metoCLOPramide (REGLAN) 10 MG tablet Take 1 tablet (10 mg total) by mouth every 6 (six) hours.  30 tablet  0     Allergies  Allergen Reactions  . Food Anaphylaxis    Strawberries, Cashews, causes throat to swell - affects breathing    BP 128/80  Pulse 78  Temp(Src) 96.7 F (35.9 C) (Temporal)  Resp 14  Ht 5\' 5"  (1.651 m)  Wt 149 lb 2 oz (67.643 kg)  BMI 24.82 kg/m2     Review of Systems  Constitutional: Positive for chills. Negative for fever and diaphoresis.  HENT: Negative for nosebleeds, sore throat, facial swelling, mouth sores, trouble swallowing and ear discharge.   Eyes: Negative for photophobia, discharge and visual disturbance.  Respiratory: Positive for cough, chest tightness and wheezing. Negative for choking, shortness of breath and stridor.   Cardiovascular: Negative for chest pain and palpitations.    Gastrointestinal: Positive for nausea, vomiting, abdominal pain and constipation. Negative for diarrhea, blood in stool, abdominal distention, anal bleeding and rectal pain.  Genitourinary: Negative for dysuria, urgency, difficulty urinating and  testicular pain.  Musculoskeletal: Negative for myalgias, back pain, arthralgias and gait problem.  Skin: Negative for color change, pallor, rash and wound.  Neurological: Positive for weakness and headaches. Negative for dizziness, speech difficulty and numbness.  Hematological: Negative for adenopathy. Does not bruise/bleed easily.  Psychiatric/Behavioral: Negative for hallucinations, confusion and agitation.       Objective:   Physical Exam  Constitutional: He is oriented to person, place, and time. He appears well-developed and well-nourished. No distress.  HENT:  Head: Normocephalic.  Mouth/Throat: Oropharynx is clear and moist. No oropharyngeal exudate.  Eyes: Conjunctivae and EOM are normal. Pupils are equal, round, and reactive to light. No scleral icterus.  Neck: Normal range of motion. Neck supple. No tracheal deviation present.  Cardiovascular: Normal rate, regular rhythm and intact distal pulses.   Pulmonary/Chest: Effort normal and breath sounds normal. No respiratory distress.  Abdominal: Soft. Normal appearance and bowel sounds are normal. He exhibits no distension, no pulsatile liver and no ascites. There is no hepatosplenomegaly. There is no tenderness. There is CVA tenderness. A hernia is present. Hernia confirmed positive in the ventral area. Hernia confirmed negative in the right inguinal area and confirmed negative in the left inguinal area.    Musculoskeletal: Normal range of motion. He exhibits no tenderness.  Lymphadenopathy:    He has no cervical adenopathy.       Right: No inguinal adenopathy present.       Left: No inguinal adenopathy present.  Neurological: He is alert and oriented to person, place, and time. No  cranial nerve deficit. He exhibits normal muscle tone. Coordination normal.  Skin: Skin is warm and dry. No rash noted. He is not diaphoretic. No erythema. No pallor.  Psychiatric: He has a normal mood and affect. His speech is normal. Judgment and thought content normal. His mood appears not anxious. His affect is not angry and not inappropriate. He is slowed and withdrawn. He is not agitated, not aggressive and not combative. Cognition and memory are normal.       Glazed, slow to speak, but pleasant   No results found.  CT scan Feb 2013: No intra-abdominal pathology or hernia noted. However, I can see subtle fat changes supraumbilically that are suspicious for a ventral hernia.    Assessment:     Supraumbilical VWH    Plan:     I think this is small enough that a primary repair would be the simplest and least stressful thing for him. I do not think he will even need mesh since it is so small and is relatively thin. With his chronic retching, he may benefit from mesh reinforcement, but if the fascial defect this is as small as it seems I would keep it simple  The anatomy & physiology of the abdominal wall was discussed.  The pathophysiology of hernias was discussed.  Natural history risks without surgery including progeressive enlargement, pain, incarceration & strangulation was discussed.   Contributors to complications such as smoking, obesity, diabetes, prior surgery, etc were discussed.   I feel the risks of no intervention will lead to serious problems that outweigh the operative risks; therefore, I recommended surgery to reduce and repair the hernia.  I explained laparoscopic techniques with possible need for an open approach.  I noted the probable use of mesh to patch and/or buttress the hernia repair  Risks such as bleeding, infection, abscess, need for further treatment, heart attack, death, and other risks were discussed.  I noted a good likelihood this  will help address the problem.    Goals of post-operative recovery were discussed as well.  Possibility that this will not correct all symptoms was explained.  I stressed the importance of low-impact activity, aggressive pain control, avoiding constipation, & not pushing through pain to minimize risk of post-operative chronic pain or injury. Possibility of reherniation especially with smoking, obesity, diabetes, immunosuppression, and other health conditions was discussed.  We will work to minimize complications.     An educational handout further explaining the pathology & treatment options was given as well.  Questions were answered.  The patient expresses understanding & wishes to proceed with surgery.  Gastroparesis. I think he needs to get back on his metoclopramide. I gave him a discussion about smaller meals. Control his reflux more aggressively as well. Trial of omeprazole. If that is not better, consider gastroenterology evaluation.  While he has a significant cardiac and health history, his medical issues are stable. There should be a simple outpatient procedure He's been cleared by medicine based on their last visit.

## 2012-03-25 NOTE — Patient Instructions (Signed)
Gastroparesis   Gastroparesis is also called slowed stomach emptying (delayed gastric emptying). It is a condition in which the stomach takes too long to empty its contents. It often happens in people with diabetes.   CAUSES   Gastroparesis happens when nerves to the stomach are damaged or stop working. When the nerves are damaged, the muscles of the stomach and intestines do not work normally. The movement of food is slowed or stopped. High blood glucose (sugar) causes changes in nerves and can damage the blood vessels that carry oxygen and nutrients to the nerves.  RISK FACTORS   Diabetes.   Post-viral syndromes.   Eating disorders (anorexia, bulimia).   Surgery on the stomach or vagus nerve.   Gastroesophageal reflux disease (rarely).   Smooth muscle disorders (amyloidosis, scleroderma).   Metabolic disorders, including hypothyroidism.   Parkinson's disease.  SYMPTOMS    Heartburn.   Feeling sick to your stomach (nausea).   Vomiting of undigested food.   An early feeling of fullness when eating.   Weight loss.   Abdominal bloating.   Erratic blood glucose levels.   Lack of appetite.   Gastroesophageal reflux.   Spasms of the stomach wall.  Complications can include:   Bacterial overgrowth in stomach. Food stays in the stomach and can ferment and cause bacteria to grow.   Weight loss due to difficulty digesting and absorbing nutrients.   Vomiting.   Obstruction in the stomach. Undigested food can harden and cause nausea and vomiting.   Blood glucose fluctuations caused by inconsistent food absorption.  DIAGNOSIS   The diagnosis of gastroparesis is confirmed through one or more of the following tests:   Barium X-rays and scans. These tests look at how long it takes for food to move through the stomach.   Gastric manometry. This test measures electrical and muscular activity in the stomach. A thin tube is passed down the throat into the stomach. The tube contains a wire that takes  measurements of the stomach's electrical and muscular activity as it digests liquids and solid food.   Endoscopy. This procedure is done with a long, thin tube called an endoscope. It is passed through the mouth and gently guides down the esophagus into the stomach. This tube helps the caregiver look at the lining of the stomach to check for any abnormalities.   Ultrasound. This can rule out gallbladder disease or pancreatitis. This test will outline and define the shape of the gallbladder and pancreas.  TREATMENT    The primary treatment is to identify the problem and help control blood glucose levels. Treatments include:   Exercise.   Medicines to control nausea and vomiting.   Medicines to stimulate stomach muscles.   Changes in what and when you eat.   Having smaller meals more often.   Eating low-fiber forms of high-fiber foods, such aseating cooked vegetables instead of raw vegetables.   Eating low-fat foods.   Consuming liquids, which are easier to digest.   In severe cases, feeding tubes and intravenous (IV) feeding may be needed.  It is important to note that in most cases, treatment does not cure gastroparesis. It is usually a lasting (chronic) condition. Treatment helps you manage the condition so that you can be as healthy and comfortable as possible.  NEW TREATMENTS   A gastric neurostimulator has been developed to assist people with gastroparesis. The battery-operated device is surgically implanted. It emits mild electrical pulses to help improve stomach emptying and to control nausea and   improve stomach emptying by decreasing the prolonged contractions of the muscle between the stomach and the small intestine (pyloric sphincter). The benefits are temporary.  SEEK MEDICAL CARE IF:   You are having problems keeping your blood glucose in goal range.   You are having nausea, vomiting, bloating, or  early feelings of fullness with eating.   Your symptoms do not change with a change in diet.  Document Released: 11/05/2005 Document Revised: 10/25/2011 Document Reviewed: 04/14/2009 [facility] Patient Information 2012 [place], [place].  Hernia, Surgical Repair A hernia occurs when an internal organ pushes out through a weak spot in the belly (abdominal) wall muscles. Hernias commonly occur in the groin and around the navel. Hernias often can be pushed back into place (reduced). Most hernias tend to get worse over time. Problems occur when abdominal contents get stuck in the opening (incarcerated hernia). The blood supply gets cut off (strangulated hernia). This is an emergency and needs surgery. Otherwise, hernia repair can be an elective procedure. This means you can schedule this at your convenience when an emergency is not present. Because complications can occur, if you decide to repair the hernia, it is best to do it soon. When it becomes an emergency procedure, there is increased risk of complications after surgery. CAUSES   Heavy lifting.   Obesity.   Prolonged coughing.   Straining to move your bowels.   Hernias can also occur through a cut (incision) by a surgeonafter an abdominal operation.  HOME CARE INSTRUCTIONS Before the repair:  Bed rest is not required. You may continue your normal activities, but avoid heavy lifting (more than 10 pounds) or straining. Cough gently. If you are a smoker, it is best to stop. Even the best hernia repair can break down with the continual strain of coughing.   Do not wear anything tight over your hernia. Do not try to keep it in with an outside bandage or truss. These can damage abdominal contents if they are trapped in the hernia sac.   Eat a normal diet. Avoid constipation. Straining over long periods of time to have a bowel movement will increase hernia size. It also can breakdown repairs. If you cannot do this with diet alone, laxatives or  stool softeners may be used.  PRIOR TO SURGERY, SEEK IMMEDIATE MEDICAL CARE IF: You have problems (symptoms) of a trapped (incarcerated) hernia. Symptoms include:  An oral temperature above 102 F (38.9 C) develops, or as your caregiver suggests.   Increasing abdominal pain.   Feeling sick to your stomach(nausea) and vomiting.   You stop passing gas or stool.   The hernia is stuck outside the abdomen, looks discolored, feels hard, or is tender.   You have any changes in your bowel habits or in the hernia that is unusual for you.  LET YOUR CAREGIVERS KNOW ABOUT THE FOLLOWING:  Allergies.   Medications taken including herbs, eye drops, over the counter medications, and creams.   Use of steroids (by mouth or creams).   Family or personal history of problems with anesthetics or Novocaine.   Possibility of pregnancy, if this applies.   Personal history of blood clots (thrombophlebitis).   Family or personal history of bleeding or blood problems.   Previous surgery.   Other health problems.  BEFORE THE PROCEDURE You should be present 1 hour prior to your procedure, or as directed by your caregiver.  AFTER THE PROCEDURE After surgery, you will be taken to the recovery area. A nurse will watch and  check your progress there. Once you are awake, stable, and taking fluids well, you will be allowed to go home as long as there are no problems. Once home, an ice pack (wrapped in a light towel) applied to your operative site may help with discomfort. It may also keep the swelling down. Do not lift anything heavier than 10 pounds (4.55 kilograms). Take showers not baths. Do not drive while taking narcotics. Follow instructions as suggested by your caregiver.  SEEK IMMEDIATE MEDICAL CARE IF: After surgery:  There is redness, swelling, or increasing pain in the wound.   There is pus coming from the wound.   There is drainage from a wound lasting longer than 1 day.   An unexplained oral  temperature above 102 F (38.9 C) develops.   You notice a foul smell coming from the wound or dressing.   There is a breaking open of a wound (edged not staying together) after the sutures have been removed.   You notice increasing pain in the shoulders (shoulder strap areas).   You develop dizzy episodes or fainting while standing.   You develop persistent nausea or vomiting.   You develop a rash.   You have difficulty breathing.   You develop any reaction or side effects to medications given.  MAKE SURE YOU:   Understand these instructions.   Will watch your condition.   Will get help right away if you are not doing well or get worse.  Document Released: 05/01/2001 Document Revised: 10/25/2011 Document Reviewed: 03/23/2008 [facility] Patient Information 2012 [place], [place].

## 2012-03-27 NOTE — Pre-Procedure Instructions (Signed)
20 Ryan Winters  03/27/2012   Your procedure is scheduled on:  Tues, May 14 @ 11:35 AM  Report to [facility] at 9:30 AM.  Call this number if you have problems the morning of surgery: [phone]   Remember:   Do not eat food:After Midnight.  May have clear liquids: up to 4 Hours before arrival.(until 5:30 AM)  Clear liquids include soda, tea, black coffee, apple or grape juice, broth,water  Take these medicines the morning of surgery with A SIP OF WATER: Abilify,Apresoline,Metoprolol,Prilosec,and Phenergan<If needed>   Do not wear jewelry  Do not wear lotions, powders, or perfumes.   Do not bring valuables to the hospital.  Contacts, dentures or bridgework may not be worn into surgery.  Leave suitcase in the car. After surgery it may be brought to your room.  For patients admitted to the hospital, checkout time is 11:00 AM the day of discharge.     Please read over the following fact sheets that you were given:

## 2012-03-28 ENCOUNTER — Inpatient Hospital Stay (HOSPITAL_COMMUNITY): Admission: RE | Admit: 2012-03-28 | Discharge: 2012-03-28 | Payer: Medicaid Other | Source: Ambulatory Visit

## 2012-03-31 ENCOUNTER — Encounter (HOSPITAL_COMMUNITY): Payer: Self-pay

## 2012-03-31 ENCOUNTER — Encounter (HOSPITAL_COMMUNITY)
Admission: RE | Admit: 2012-03-31 | Discharge: 2012-03-31 | Disposition: A | Discharge status: Home / Self Care | Payer: Self-pay | Source: Ambulatory Visit | Attending: Surgery | Admitting: Surgery

## 2012-03-31 HISTORY — DX: Unspecified osteoarthritis, unspecified site: M19.90

## 2012-03-31 HISTORY — DX: Shortness of breath: R06.02

## 2012-03-31 HISTORY — DX: Major depressive disorder, single episode, unspecified: F32.9

## 2012-03-31 HISTORY — DX: Depression, unspecified: F32.A

## 2012-03-31 LAB — COMPREHENSIVE METABOLIC PANEL
AST: 49 U/L — ABNORMAL HIGH (ref 0–37)
Albumin: 3.1 g/dL — ABNORMAL LOW (ref 3.5–5.2)
Calcium: 9.1 mg/dL (ref 8.4–10.5)
Creatinine, Ser: 2.84 mg/dL — ABNORMAL HIGH (ref 0.50–1.35)

## 2012-03-31 LAB — CBC
HCT: 30.8 % — ABNORMAL LOW (ref 39.0–52.0)
Hemoglobin: 9.9 g/dL — ABNORMAL LOW (ref 13.0–17.0)
MCV: 86 fL (ref 78.0–100.0)
RDW: 24 % — ABNORMAL HIGH (ref 11.5–15.5)
WBC: 8.2 10*3/uL (ref 4.0–10.5)

## 2012-03-31 LAB — SURGICAL PCR SCREEN: Staphylococcus aureus: NEGATIVE

## 2012-03-31 MED ORDER — CEFAZOLIN SODIUM-DEXTROSE 2-3 GM-% IV SOLR
2.0000 g | INTRAVENOUS | Status: AC
  Administered 2012-04-01: 2 g via INTRAVENOUS
  Filled 2012-03-31: qty 50

## 2012-04-01 ENCOUNTER — Ambulatory Visit (HOSPITAL_COMMUNITY): Payer: Self-pay | Admitting: Anesthesiology

## 2012-04-01 ENCOUNTER — Encounter (HOSPITAL_COMMUNITY)
Admission: RE | Disposition: A | Discharge status: Home / Self Care | Payer: Self-pay | Source: Ambulatory Visit | Attending: Surgery

## 2012-04-01 ENCOUNTER — Encounter (HOSPITAL_COMMUNITY): Payer: Self-pay | Admitting: *Deleted

## 2012-04-01 ENCOUNTER — Ambulatory Visit (HOSPITAL_COMMUNITY)
Admission: RE | Admit: 2012-04-01 | Discharge: 2012-04-01 | Disposition: A | Discharge status: Home / Self Care | Payer: Self-pay | Source: Ambulatory Visit | Attending: Surgery | Admitting: Surgery

## 2012-04-01 ENCOUNTER — Encounter (HOSPITAL_COMMUNITY): Payer: Self-pay | Admitting: Anesthesiology

## 2012-04-01 DIAGNOSIS — I509 Heart failure, unspecified: Secondary | ICD-10-CM | POA: Insufficient documentation

## 2012-04-01 DIAGNOSIS — I129 Hypertensive chronic kidney disease with stage 1 through stage 4 chronic kidney disease, or unspecified chronic kidney disease: Secondary | ICD-10-CM | POA: Insufficient documentation

## 2012-04-01 DIAGNOSIS — Z01812 Encounter for preprocedural laboratory examination: Secondary | ICD-10-CM | POA: Insufficient documentation

## 2012-04-01 DIAGNOSIS — K219 Gastro-esophageal reflux disease without esophagitis: Secondary | ICD-10-CM | POA: Insufficient documentation

## 2012-04-01 DIAGNOSIS — F319 Bipolar disorder, unspecified: Secondary | ICD-10-CM | POA: Insufficient documentation

## 2012-04-01 DIAGNOSIS — I340 Nonrheumatic mitral (valve) insufficiency: Secondary | ICD-10-CM

## 2012-04-01 DIAGNOSIS — I5033 Acute on chronic diastolic (congestive) heart failure: Secondary | ICD-10-CM | POA: Insufficient documentation

## 2012-04-01 DIAGNOSIS — F411 Generalized anxiety disorder: Secondary | ICD-10-CM | POA: Insufficient documentation

## 2012-04-01 DIAGNOSIS — N184 Chronic kidney disease, stage 4 (severe): Secondary | ICD-10-CM | POA: Insufficient documentation

## 2012-04-01 DIAGNOSIS — F1410 Cocaine abuse, uncomplicated: Secondary | ICD-10-CM | POA: Insufficient documentation

## 2012-04-01 DIAGNOSIS — N289 Disorder of kidney and ureter, unspecified: Secondary | ICD-10-CM | POA: Insufficient documentation

## 2012-04-01 DIAGNOSIS — I059 Rheumatic mitral valve disease, unspecified: Secondary | ICD-10-CM | POA: Insufficient documentation

## 2012-04-01 DIAGNOSIS — K439 Ventral hernia without obstruction or gangrene: Secondary | ICD-10-CM

## 2012-04-01 DIAGNOSIS — I5032 Chronic diastolic (congestive) heart failure: Secondary | ICD-10-CM

## 2012-04-01 DIAGNOSIS — R0602 Shortness of breath: Secondary | ICD-10-CM | POA: Insufficient documentation

## 2012-04-01 DIAGNOSIS — Z87891 Personal history of nicotine dependence: Secondary | ICD-10-CM | POA: Insufficient documentation

## 2012-04-01 DIAGNOSIS — F209 Schizophrenia, unspecified: Secondary | ICD-10-CM | POA: Insufficient documentation

## 2012-04-01 HISTORY — PX: UMBILICAL HERNIA REPAIR: SHX196

## 2012-04-01 SURGERY — REPAIR, HERNIA, UMBILICAL, ADULT
Anesthesia: General | Wound class: Clean

## 2012-04-01 MED ORDER — LACTATED RINGERS IV SOLN: INTRAVENOUS | Status: DC

## 2012-04-01 MED ORDER — SUCCINYLCHOLINE CHLORIDE 20 MG/ML IJ SOLN
INTRAMUSCULAR | Status: DC | PRN
  Administered 2012-04-01: 100 mg via INTRAVENOUS

## 2012-04-01 MED ORDER — BUPIVACAINE-EPINEPHRINE 0.25% -1:200000 IJ SOLN
INTRAMUSCULAR | Status: DC | PRN
  Administered 2012-04-01: 59 mL
  Administered 2012-04-01: 1 mL

## 2012-04-01 MED ORDER — PROPOFOL 10 MG/ML IV EMUL
INTRAVENOUS | Status: DC | PRN
  Administered 2012-04-01: 200 mg via INTRAVENOUS
  Administered 2012-04-01: 100 mg via INTRAVENOUS

## 2012-04-01 MED ORDER — CHLORHEXIDINE GLUCONATE 4 % EX LIQD: 1.0000 "application " | Freq: Once | CUTANEOUS | Status: DC

## 2012-04-01 MED ORDER — SODIUM CHLORIDE 0.9 % IV SOLN
INTRAVENOUS | Status: DC | PRN
  Administered 2012-04-01 (×2): via INTRAVENOUS

## 2012-04-01 MED ORDER — ONDANSETRON HCL 4 MG/2ML IJ SOLN
INTRAMUSCULAR | Status: DC | PRN
  Administered 2012-04-01: 4 mg via INTRAVENOUS

## 2012-04-01 MED ORDER — DEXTROSE 5 % IV SOLN
INTRAVENOUS | Status: DC | PRN
  Administered 2012-04-01: 11:00:00 via INTRAVENOUS

## 2012-04-01 MED ORDER — LIDOCAINE HCL 4 % MT SOLN
OROMUCOSAL | Status: DC | PRN
  Administered 2012-04-01: 4 mL via TOPICAL

## 2012-04-01 MED ORDER — DROPERIDOL 2.5 MG/ML IJ SOLN: 0.6250 mg | INTRAMUSCULAR | Status: DC | PRN

## 2012-04-01 MED ORDER — FENTANYL CITRATE 0.05 MG/ML IJ SOLN
INTRAMUSCULAR | Status: DC | PRN
  Administered 2012-04-01: 50 ug via INTRAVENOUS
  Administered 2012-04-01: 100 ug via INTRAVENOUS
  Administered 2012-04-01 (×2): 50 ug via INTRAVENOUS

## 2012-04-01 MED ORDER — HYDROMORPHONE HCL PF 1 MG/ML IJ SOLN
0.2500 mg | INTRAMUSCULAR | Status: DC | PRN
  Administered 2012-04-01: 0.5 mg via INTRAVENOUS

## 2012-04-01 MED ORDER — SODIUM CHLORIDE 0.9 % IV SOLN
INTRAVENOUS | Status: DC
  Administered 2012-04-01: 10:00:00 via INTRAVENOUS

## 2012-04-01 MED ORDER — PHENYLEPHRINE HCL 10 MG/ML IJ SOLN
INTRAMUSCULAR | Status: DC | PRN
  Administered 2012-04-01 (×6): 40 ug via INTRAVENOUS

## 2012-04-01 MED ORDER — 0.9 % SODIUM CHLORIDE (POUR BTL) OPTIME
TOPICAL | Status: DC | PRN
  Administered 2012-04-01: 1000 mL

## 2012-04-01 MED ORDER — OXYCODONE HCL 5 MG PO TABS: 5.0000 mg | ORAL_TABLET | ORAL | Status: DC | PRN

## 2012-04-01 SURGICAL SUPPLY — 54 items
BINDER ABD UNIV 12 30-45 (MISCELLANEOUS) IMPLANT
BINDER ABDOMINAL 12 (MISCELLANEOUS) ×2
BLADE SURG 10 STRL SS (BLADE) ×2 IMPLANT
BLADE SURG 15 STRL LF DISP TIS (BLADE) ×1 IMPLANT
BLADE SURG 15 STRL SS (BLADE) ×2
BLADE SURG ROTATE 9660 (MISCELLANEOUS) ×1 IMPLANT
CANISTER SUCTION 2500CC (MISCELLANEOUS) ×1 IMPLANT
CHLORAPREP W/TINT 26ML (MISCELLANEOUS) ×2 IMPLANT
CLOTH BEACON ORANGE TIMEOUT ST (SAFETY) ×2 IMPLANT
COVER SURGICAL LIGHT HANDLE (MISCELLANEOUS) ×2 IMPLANT
DECANTER SPIKE VIAL GLASS SM (MISCELLANEOUS) ×1 IMPLANT
DRAPE PED LAPAROTOMY (DRAPES) ×2 IMPLANT
DRSG OPSITE 6X11 MED (GAUZE/BANDAGES/DRESSINGS) ×1 IMPLANT
DRSG TEGADERM 4X4.75 (GAUZE/BANDAGES/DRESSINGS) ×2 IMPLANT
ELECT CAUTERY BLADE 6.4 (BLADE) ×2 IMPLANT
ELECT REM PT RETURN 9FT ADLT (ELECTROSURGICAL) ×2
ELECTRODE REM PT RTRN 9FT ADLT (ELECTROSURGICAL) ×1 IMPLANT
GAUZE SPONGE 2X2 8PLY STRL LF (GAUZE/BANDAGES/DRESSINGS) ×1 IMPLANT
GLOVE BIO SURGEON STRL SZ7.5 (GLOVE) ×1 IMPLANT
GLOVE BIOGEL PI IND STRL 6.5 (GLOVE) IMPLANT
GLOVE BIOGEL PI IND STRL 7.5 (GLOVE) IMPLANT
GLOVE BIOGEL PI IND STRL 8 (GLOVE) ×1 IMPLANT
GLOVE BIOGEL PI INDICATOR 6.5 (GLOVE) ×1
GLOVE BIOGEL PI INDICATOR 7.5 (GLOVE) ×2
GLOVE BIOGEL PI INDICATOR 8 (GLOVE) ×1
GLOVE ECLIPSE 8.0 STRL XLNG CF (GLOVE) ×2 IMPLANT
GOWN PREVENTION PLUS XLARGE (GOWN DISPOSABLE) ×2 IMPLANT
GOWN STRL NON-REIN LRG LVL3 (GOWN DISPOSABLE) ×3 IMPLANT
KIT BASIN OR (CUSTOM PROCEDURE TRAY) ×2 IMPLANT
KIT ROOM TURNOVER OR (KITS) ×2 IMPLANT
MESH ULTRAPRO 3X6 7.6X15CM (Mesh General) ×1 IMPLANT
NEEDLE 22X1 1/2 (OR ONLY) (NEEDLE) ×2 IMPLANT
NS IRRIG 1000ML POUR BTL (IV SOLUTION) ×2 IMPLANT
PACK SURGICAL SETUP 50X90 (CUSTOM PROCEDURE TRAY) ×2 IMPLANT
PAD ARMBOARD 7.5X6 YLW CONV (MISCELLANEOUS) ×4 IMPLANT
PENCIL BUTTON HOLSTER BLD 10FT (ELECTRODE) ×2 IMPLANT
SPONGE GAUZE 2X2 STER 10/PKG (GAUZE/BANDAGES/DRESSINGS) ×1
SPONGE LAP 18X18 X RAY DECT (DISPOSABLE) ×2 IMPLANT
SUT ETHIBOND NAB CT1 #1 30IN (SUTURE) IMPLANT
SUT MNCRL AB 4-0 PS2 18 (SUTURE) ×3 IMPLANT
SUT PDS AB 1 CT  36 (SUTURE)
SUT PDS AB 1 CT 36 (SUTURE) ×2 IMPLANT
SUT PROLENE 0 CT 1 30 (SUTURE) ×4 IMPLANT
SUT PROLENE 0 CTX CR/8 (SUTURE) ×2 IMPLANT
SUT VIC AB 0 CT1 27 (SUTURE) ×2
SUT VIC AB 0 CT1 27XBRD ANBCTR (SUTURE) ×1 IMPLANT
SUT VICRYL AB 2 0 TIES (SUTURE) ×1 IMPLANT
SYR BULB 3OZ (MISCELLANEOUS) ×2 IMPLANT
SYR CONTROL 10ML LL (SYRINGE) ×2 IMPLANT
TOWEL OR 17X24 6PK STRL BLUE (TOWEL DISPOSABLE) ×2 IMPLANT
TOWEL OR 17X26 10 PK STRL BLUE (TOWEL DISPOSABLE) ×2 IMPLANT
TOWEL OR NON WOVEN STRL DISP B (DISPOSABLE) ×1 IMPLANT
TUBE CONNECTING 12X1/4 (SUCTIONS) ×1 IMPLANT
YANKAUER SUCT BULB TIP NO VENT (SUCTIONS) ×1 IMPLANT

## 2012-04-01 NOTE — Anesthesia Preprocedure Evaluation (Addendum)
Anesthesia Evaluation  Patient identified by MRN, date of birth, ID band Patient awake    Reviewed: Allergy & Precautions, H&P , NPO status , Patient's Chart, lab work & pertinent test results  History of Anesthesia Complications Negative for: history of anesthetic complications  Airway Mallampati: II TM Distance: >3 FB Neck ROM: Full    Dental  (+) Teeth Intact and Dental Advisory Given   Pulmonary shortness of breath and with exertion, former smoker breath sounds clear to auscultation  Pulmonary exam normal       Cardiovascular hypertension, Pt. on medications and Pt. on home beta blockers +CHF + Valvular Problems/Murmurs MR Rhythm:Regular Rate:Normal + Systolic murmurs    Neuro/Psych PSYCHIATRIC DISORDERS Anxiety Bipolar Disorder Schizophrenia negative neurological ROS     GI/Hepatic GERD-  Medicated and Controlled,(+)     substance abuse  cocaine use,   Endo/Other  negative endocrine ROS  Renal/GU Renal InsufficiencyRenal disease     Musculoskeletal   Abdominal   Peds  Hematology   Anesthesia Other Findings Overbite  Reproductive/Obstetrics                         Anesthesia Physical Anesthesia Plan  ASA: III  Anesthesia Plan: General   Post-op Pain Management:    Induction: Intravenous  Airway Management Planned: Oral ETT  Additional Equipment:   Intra-op Plan:   Post-operative Plan: Extubation in OR  Informed Consent: I have reviewed the patients History and Physical, chart, labs and discussed the procedure including the risks, benefits and alternatives for the proposed anesthesia with the patient or authorized representative who has indicated his/her understanding and acceptance.   Dental advisory given  Plan Discussed with: CRNA, Anesthesiologist and Surgeon  Anesthesia Plan Comments:         Anesthesia Quick Evaluation

## 2012-04-01 NOTE — Progress Notes (Signed)
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 Pt states that he is not on dialysis at this time.

## 2012-04-01 NOTE — Transfer of Care (Signed)
Immediate Anesthesia Transfer of Care Note  [name]  Procedure(s) Performed: Procedure(s) (LRB): HERNIA REPAIR UMBILICAL ADULT (N/A)  Patient Location: PACU  Anesthesia Type: General  Level of Consciousness: awake, alert , oriented and patient cooperative  Airway & Oxygen Therapy: Patient Spontanous Breathing and Patient connected to nasal cannula oxygen  Post-op Assessment: Report given to PACU RN and Post -op Vital signs reviewed and stable  Post vital signs: Reviewed and stable  Complications: No apparent anesthesia complications

## 2012-04-01 NOTE — Op Note (Signed)
04/01/2012  11:50 AM  PATIENT:  Ryan Winters  51 y.o. male  Patient Care Team: Jackie Plum, MD as PCP - General (Internal Medicine)  PRE-OPERATIVE DIAGNOSIS:  supraumbilical ventral wall hernia  POST-OPERATIVE DIAGNOSIS:  supraumbilical ventral wall hernia x2  PROCEDURE:  Procedure(s): HERNIA REPAIR - supraUMBILICAL with mesh  SURGEON:  Surgeon(s): Ardeth Sportsman, MD  ASSISTANT: none   ANESTHESIA:   local and general  EBL:  Total I/O In: 50 [I.V.:50] Out: -   Delay start of Pharmacological VTE agent (>24hrs) due to surgical blood loss or risk of bleeding:  no  DRAINS: none   SPECIMEN:  No Specimen  DISPOSITION OF SPECIMEN:  N/A  COUNTS:  YES  PLAN OF CARE: Discharge to home after PACU  PATIENT DISPOSITION:  PACU - hemodynamically stable.  INDICATION: Patient is a thin male with a history of gastroparesis and retching. He noticed a lump above his bellybutton. It became uncomfortable and larger. Examination was concerning for supraumbilical hernia. I recommended repair.  I noted this was small enough that probably primary  Mesh reinforcement would be adequate.  The anatomy & physiology of the abdominal wall was discussed.  The pathophysiology of hernias was discussed.  Natural history risks without surgery including progeressive enlargement, pain, incarceration & strangulation was discussed.   Contributors to complications such as smoking, obesity, diabetes, prior surgery, etc were discussed.   I feel the risks of no intervention will lead to serious problems that outweigh the operative risks; therefore, I recommended surgery to reduce and repair the hernia.  I explained laparoscopic techniques with possible need for an open approach.  I noted the probable use of mesh to patch and/or buttress the hernia repair  Risks such as bleeding, infection, abscess, need for further treatment, heart attack, death, and other risks were discussed.  I noted a good likelihood  this will help address the problem.   Goals of post-operative recovery were discussed as well.  Possibility that this will not correct all symptoms was explained.  I stressed the importance of low-impact activity, aggressive pain control, avoiding constipation, & not pushing through pain to minimize risk of post-operative chronic pain or injury. Possibility of reherniation especially with smoking, obesity, diabetes, immunosuppression, and other health conditions was discussed.  We will work to minimize complications.     An educational handout further explaining the pathology & treatment options was given as well.  Questions were answered.  The patient expresses understanding & wishes to proceed with surgery.  OR FINDINGS: He had 8 mm and 18 mm supraumbilical hernias.  DESCRIPTION:   Informed consent was confirmed. Patient underwent general anesthesia without difficulty. He was positioned supine with arms tucked. Sequential compression devices active during the entire case. He received IV antibiotics. His abdomen was prepped and draped in a sterile fashion. Surgical timeout confirmed or plan.  I made a transverse incision through the supraumbilical region. I created skin/SQ flaps off the fascia. Of note, I elevated the umbilical stalk off the anterior rectus fascia. I encountered an area of 2 hernias the supraumbilical region. They were a few fingerbreadths above the umbilicus. I reduced the hernia sacs into the preperitoneal space. I primarily closed the hernias transversely ysing interrupted 0 Prolene stitches.   I chose a 8 x 8 cm thin sheet of ultralight weight polypropylene mesh. I laid it onto the fascial closure. I used the tails of the supraumbilical transverse closure to help tie the central part of the mesh down. I secured the  edges of the mesh to the anterior rectus fascia using 0 Prolene interrupted stitches at 4 corners, 4 sides, and centrally for a total of 10 additional stitches. The mesh  laid well providing at least 4 cm circumferential coverage around the rim. I closed Scarpa's fascia to the mesh to help close the dead space. the mesh to help have the mesh cover the umbilical region as well. I tacked the umbilical stalk back down to the fascia using a Monocryl stitch   I reapproximated Scarpa's fascia together using Monocryl stitch interrupted.   I closed the skin using running Monocryl stitch. Sterile dressing applied.  Patient is being extubated to recovery room.

## 2012-04-01 NOTE — Discharge Instructions (Signed)
HERNIA REPAIR: POST OP INSTRUCTIONS  1. DIET: Follow a light bland diet the first 24 hours after arrival home, such as soup, liquids, crackers, etc.  Be sure to include lots of fluids daily.  Avoid fast food or heavy meals as your are more likely to get nauseated.  Eat a low fat the next few days after surgery. 2. Take your usually prescribed home medications unless otherwise directed. 3. PAIN CONTROL: a. Pain is best controlled by a usual combination of three different methods TOGETHER: i. Ice/Heat ii. Over the counter pain medication iii. Prescription pain medication b. Most patients will experience some swelling and bruising around the hernia(s) such as the bellybutton, groins, or old incisions.  Ice packs or heating pads (30-60 minutes up to 6 times a day) will help. Use ice for the first few days to help decrease swelling and bruising, then switch to heat to help relax tight/sore spots and speed recovery.  Some people prefer to use ice alone, heat alone, alternating between ice & heat.  Experiment to what works for you.  Swelling and bruising can take several weeks to resolve.   c. It is helpful to take an over-the-counter pain medication regularly for the first few weeks.  Choose one of the following that works best for you: i. Naproxen (Aleve, etc)  Two 220mg tabs twice a day ii. Ibuprofen (Advil, etc) Three 200mg tabs four times a day (every meal & bedtime) iii. Acetaminophen (Tylenol, etc) 325-650mg four times a day (every meal & bedtime) d. A  prescription for pain medication should be given to you upon discharge.  Take your pain medication as prescribed.  i. If you are having problems/concerns with the prescription medicine (does not control pain, nausea, vomiting, rash, itching, etc), please call us [phone] to see if we need to switch you to a different pain medicine that will work better for you and/or control your side effect better. ii. If you need a refill on your pain  medication, please contact your pharmacy.  They will contact our office to request authorization. Prescriptions will not be filled after 5 pm or on week-ends. 4. Avoid getting constipated.  Between the surgery and the pain medications, it is common to experience some constipation.  Increasing fluid intake and taking a fiber supplement (such as Metamucil, Citrucel, FiberCon, MiraLax, etc) 1-2 times a day regularly will usually help prevent this problem from occurring.  A mild laxative (prune juice, Milk of Magnesia, MiraLax, etc) should be taken according to package directions if there are no bowel movements after 48 hours.   5. Wash / shower every day.  You may shower over the dressings as they are waterproof.   6. Remove your waterproof bandages 5 days after surgery.  You may leave the incision open to air.  You may replace a dressing/Band-Aid to cover the incision for comfort if you wish.  Continue to shower over incision(s) after the dressing is off.    7. ACTIVITIES as tolerated:   a. You may resume regular (light) daily activities beginning the next day-such as daily self-care, walking, climbing stairs-gradually increasing activities as tolerated.  If you can walk 30 minutes without difficulty, it is safe to try more intense activity such as jogging, treadmill, bicycling, low-impact aerobics, swimming, etc. b. Save the most intensive and strenuous activity for last such as sit-ups, heavy lifting, contact sports, etc  Refrain from any heavy lifting or straining until you are off narcotics for pain control.     c. DO NOT PUSH THROUGH PAIN.  Let pain be your guide: If it hurts to do something, don't do it.  Pain is your body warning you to avoid that activity for another week until the pain goes down. d. You may drive when you are no longer taking prescription pain medication, you can comfortably wear a seatbelt, and you can safely maneuver your car and apply brakes. e. You may have sexual intercourse  when it is comfortable.  8. FOLLOW UP in our office a. Please call [facility] at [phone] to set up an appointment to see your surgeon in the office for a follow-up appointment approximately 2-3 weeks after your surgery. b. Make sure that you call for this appointment the day you arrive home to insure a convenient appointment time. 9.  IF YOU HAVE DISABILITY OR FAMILY LEAVE FORMS, BRING THEM TO THE OFFICE FOR PROCESSING.  DO NOT GIVE THEM TO YOUR DOCTOR.  WHEN TO CALL US [phone]: 1. Poor pain control 2. Reactions / problems with new medications (rash/itching, nausea, etc)  3. Fever over 101.5 F (38.5 C) 4. Inability to urinate 5. Nausea and/or vomiting 6. Worsening swelling or bruising 7. Continued bleeding from incision. 8. Increased pain, redness, or drainage from the incision   The clinic staff is available to answer your questions during regular business hours (8:30am-5pm).  Please don't hesitate to call and ask to speak to one of our nurses for clinical concerns.   If you have a medical emergency, go to the nearest emergency room or call 911.  A surgeon from [facility] is always on call at the hospitals in [place]  [facility] [address], [address], [place], [place]  [address] ?  [address] [address], [place], [place]   [address] MAIN: [phone] ? TOLL FREE: [phone] ? FAX: [phone] [email or web address]  

## 2012-04-01 NOTE — Interval H&P Note (Signed)
History and Physical Interval Note:  04/01/2012 10:25 AM  Ryan Winters  has presented today for surgery, with the diagnosis of supraumbilical ventral wall hernia  The various methods of treatment have been discussed with the patient and family. After consideration of risks, benefits and other options for treatment, the patient has consented to  Procedure(s) (LRB): HERNIA REPAIR UMBILICAL ADULT (N/A) as a surgical intervention .  The patients' history has been reviewed, patient examined, no change in status, stable for surgery.  I have reviewed the patients' chart and labs.  Questions were answered to the patient's satisfaction.     [name] C.

## 2012-04-01 NOTE — Progress Notes (Signed)
Dr. Krista Blue notified of patient drinking 4 oz of black coffee at 0830 this am. No new orders received. Nickolas Madrid

## 2012-04-01 NOTE — H&P (View-Only) (Signed)
Subjective:     Patient ID: Ryan Winters, male   DOB: 02/10/1961, 50 y.o.   MRN: [id number]  HPI  Ryan Winters  05/06/1961 [id number]  Patient Care Team: George Osei-Bonsu, MD as PCP - General (Internal Medicine)  This patient is a 50 y.o.male who presents today for surgical evaluation at the request of Dr. Osei.   Reason for visit: Nausea vomiting abdominal pain and subumbilical hernia.  Patient is a 50-year-old male. History of cocaine and substance abuse. Has had cardiac surgery. Kidney dysfunction & hypertensive.  He struggles with nausea vomiting. Has heartburn. Occasionally uses TUMS. Cannot lie down flat secondary to difficulty breathing and reflux. Was told he has gastroparesis. Try to switch to smaller meals.  He noticed a lump above his bellybutton about 9 months ago. It is sensitive. There is concern that perhaps this is contributing to his nausea and vomiting. He was seen in the hospital last month. He was sent to us for surgical evaluation.  Patient notes she can walk about a half hour without chest pain as long as he goes slowly and steadily. A lot of complaints of joint aches and discomfort but has been stable. He's down to just a few cigarettes a day. Is trying to get off cocaine but it has been a challenge. Has bipolar disorder and schizophrenia as well. No major psych readmissions or suicidal attempts.  No skin infections.  Patient Active Problem List  Diagnoses  . Anemia in chronic kidney disease  . SCHIZOPHRENIA  . BIPOLAR AFFECTIVE DISORDER  . HTN (hypertension), malignant  . Mitral valve disorders  . Acute on chronic diastolic heart failure  . HEMATURIA UNSPECIFIED  . CARDIAC MURMUR  . COCAINE ABUSE, HX OF  . Rectal bleeding  . Abdominal pain  . CKD (chronic kidney disease), stage IV  . Mitral valvular regurgitation  . Ascites  . Constipation  . Chest pain  . GERD (gastroesophageal reflux disease)  . Substance abuse  . Atypical chest pain  .  Gastroparesis  . T wave inversion in EKG  . Suprumbilical hernia  . Schizophrenia  . CHF (congestive heart failure)  . N&V (nausea and vomiting)    Past Medical History  Diagnosis Date  . Hypertension   . Bipolar disorder   . Schizophrenia     h/[facility] in[facility]  . 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 Hyperlipidemia   . Chronic kidney disease   . H/O: suicide attempt 1998    was hospitalized at [name]  . Anemia     Hx of iron infusions  . CHF (congestive heart failure)     Hx of Class IV CHF  . Gastroparesis   . Chest pain   . Abdominal pain   . Blood in stool   . N&V (nausea and vomiting)   . Generalized headaches     sinus  . Weakness     Past Surgical History  Procedure Date  . Mitral valve repair 05/2010  . Av fistula placement, radiocephalic 05/24/2011    Left arm  . Leg surgery     History   Social History  . Marital Status: Married    Spouse Name: N/A    Number of Children: N/A  . Years of Education: N/A   Occupational History  . disabled    Social History Main Topics  . Smoking status:   Former Smoker -- 30 years    Types: Cigarettes    Quit date: 03/21/2011  . Smokeless tobacco: Never Used  . Alcohol Use: No     Quit alcohol in July of 2011  . Drug Use: Yes    Special: Cocaine, Marijuana     None since November when had rectal bleeding and went to hospital  . Sexually Active: No   Other Topics Concern  . Not on file   Social History Narrative   He lives at home with his wife and has a sister. He denies any cigs, drugs, alcohol at present.     Family History  Problem Relation Age of Onset  . Hypertension Mother     Current Outpatient Prescriptions  Medication Sig Dispense Refill  . Docusate Calcium (STOOL SOFTENER PO) Take by mouth as needed. OTC      . ARIPiprazole (ABILIFY) 5 MG tablet Take 5 mg by mouth daily.         . aspirin 81 MG EC tablet Take 81 mg by mouth daily.        . cyclobenzaprine (FLEXERIL) 5 MG tablet Take 5 mg by mouth 3 (three) times daily as needed. Muscle pain      . docusate sodium (COLACE) 100 MG capsule Take 100 mg by mouth daily as needed. For constipation      . furosemide (LASIX) 80 MG tablet Take 80 mg by mouth 2 (two) times daily.      . hydrALAZINE (APRESOLINE) 25 MG tablet Take 1 tablet (25 mg total) by mouth 3 (three) times daily.  90 tablet  11  . hydrOXYzine (VISTARIL) 50 MG capsule Take 50 mg by mouth at bedtime.       . lisinopril (PRINIVIL,ZESTRIL) 20 MG tablet Take 20 mg by mouth daily.      . metoCLOPramide (REGLAN) 10 MG tablet Take 1 tablet (10 mg total) by mouth 4 (four) times daily -  before meals and at bedtime.  60 tablet  5  . metoprolol (LOPRESSOR) 50 MG tablet Take 50 mg by mouth 2 (two) times daily.      . omeprazole (PRILOSEC) 40 MG capsule Take 1 capsule (40 mg total) by mouth 2 (two) times daily.  60 capsule  5  . promethazine (PHENERGAN) 25 MG tablet Take 25 mg by mouth every 6 (six) hours as needed. Nausea      . DISCONTD: metoCLOPramide (REGLAN) 10 MG tablet Take 1 tablet (10 mg total) by mouth every 6 (six) hours.  30 tablet  0     Allergies  Allergen Reactions  . Food Anaphylaxis    Strawberries, Cashews, causes throat to swell - affects breathing    BP 128/80  Pulse 78  Temp(Src) 96.7 F (35.9 C) (Temporal)  Resp 14  Ht 5' 5" (1.651 m)  Wt 149 lb 2 oz (67.643 kg)  BMI 24.82 kg/m2     Review of Systems  Constitutional: Positive for chills. Negative for fever and diaphoresis.  HENT: Negative for nosebleeds, sore throat, facial swelling, mouth sores, trouble swallowing and ear discharge.   Eyes: Negative for photophobia, discharge and visual disturbance.  Respiratory: Positive for cough, chest tightness and wheezing. Negative for choking, shortness of breath and stridor.   Cardiovascular: Negative for chest pain and palpitations.    Gastrointestinal: Positive for nausea, vomiting, abdominal pain and constipation. Negative for diarrhea, blood in stool, abdominal distention, anal bleeding and rectal pain.  Genitourinary: Negative for dysuria, urgency, difficulty urinating and   testicular pain.  Musculoskeletal: Negative for myalgias, back pain, arthralgias and gait problem.  Skin: Negative for color change, pallor, rash and wound.  Neurological: Positive for weakness and headaches. Negative for dizziness, speech difficulty and numbness.  Hematological: Negative for adenopathy. Does not bruise/bleed easily.  Psychiatric/Behavioral: Negative for hallucinations, confusion and agitation.       Objective:   Physical Exam  Constitutional: He is oriented to person, place, and time. He appears well-developed and well-nourished. No distress.  HENT:  Head: Normocephalic.  Mouth/Throat: Oropharynx is clear and moist. No oropharyngeal exudate.  Eyes: Conjunctivae and EOM are normal. Pupils are equal, round, and reactive to light. No scleral icterus.  Neck: Normal range of motion. Neck supple. No tracheal deviation present.  Cardiovascular: Normal rate, regular rhythm and intact distal pulses.   Pulmonary/Chest: Effort normal and breath sounds normal. No respiratory distress.  Abdominal: Soft. Normal appearance and bowel sounds are normal. He exhibits no distension, no pulsatile liver and no ascites. There is no hepatosplenomegaly. There is no tenderness. There is CVA tenderness. A hernia is present. Hernia confirmed positive in the ventral area. Hernia confirmed negative in the right inguinal area and confirmed negative in the left inguinal area.    Musculoskeletal: Normal range of motion. He exhibits no tenderness.  Lymphadenopathy:    He has no cervical adenopathy.       Right: No inguinal adenopathy present.       Left: No inguinal adenopathy present.  Neurological: He is alert and oriented to person, place, and time. No  cranial nerve deficit. He exhibits normal muscle tone. Coordination normal.  Skin: Skin is warm and dry. No rash noted. He is not diaphoretic. No erythema. No pallor.  Psychiatric: He has a normal mood and affect. His speech is normal. Judgment and thought content normal. His mood appears not anxious. His affect is not angry and not inappropriate. He is slowed and withdrawn. He is not agitated, not aggressive and not combative. Cognition and memory are normal.       Glazed, slow to speak, but pleasant   No results found.  CT scan Feb 2013: No intra-abdominal pathology or hernia noted. However, I can see subtle fat changes supraumbilically that are suspicious for a ventral hernia.    Assessment:     Supraumbilical VWH    Plan:     I think this is small enough that a primary repair would be the simplest and least stressful thing for him. I do not think he will even need mesh since it is so small and is relatively thin. With his chronic retching, he may benefit from mesh reinforcement, but if the fascial defect this is as small as it seems I would keep it simple  The anatomy & physiology of the abdominal wall was discussed.  The pathophysiology of hernias was discussed.  Natural history risks without surgery including progeressive enlargement, pain, incarceration & strangulation was discussed.   Contributors to complications such as smoking, obesity, diabetes, prior surgery, etc were discussed.   I feel the risks of no intervention will lead to serious problems that outweigh the operative risks; therefore, I recommended surgery to reduce and repair the hernia.  I explained laparoscopic techniques with possible need for an open approach.  I noted the probable use of mesh to patch and/or buttress the hernia repair  Risks such as bleeding, infection, abscess, need for further treatment, heart attack, death, and other risks were discussed.  I noted a good likelihood this   will help address the problem.    Goals of post-operative recovery were discussed as well.  Possibility that this will not correct all symptoms was explained.  I stressed the importance of low-impact activity, aggressive pain control, avoiding constipation, & not pushing through pain to minimize risk of post-operative chronic pain or injury. Possibility of reherniation especially with smoking, obesity, diabetes, immunosuppression, and other health conditions was discussed.  We will work to minimize complications.     An educational handout further explaining the pathology & treatment options was given as well.  Questions were answered.  The patient expresses understanding & wishes to proceed with surgery.  Gastroparesis. I think he needs to get back on his metoclopramide. I gave him a discussion about smaller meals. Control his reflux more aggressively as well. Trial of omeprazole. If that is not better, consider gastroenterology evaluation.  While he has a significant cardiac and health history, his medical issues are stable. There should be a simple outpatient procedure He's been cleared by medicine based on their last visit.       

## 2012-04-01 NOTE — Preoperative (Addendum)
Beta BlockPt   Pt took Metoprolol on   0800      @ 04/01/12

## 2012-04-01 NOTE — Anesthesia Postprocedure Evaluation (Signed)
Anesthesia Post Note  Patient: [employer]  Procedure(s) Performed: Procedure(s) (LRB): HERNIA REPAIR UMBILICAL ADULT (N/A)  Anesthesia type: general  Patient location: PACU  Post pain: Pain level controlled  Post assessment: Patient's Cardiovascular Status Stable  Last Vitals:  Filed Vitals:   04/01/12 1245  BP:   Pulse: 85  Temp: 36.4 C  Resp: 34    Post vital signs: Reviewed and stable  Level of consciousness: sedated  Complications: No apparent anesthesia complications

## 2012-04-02 ENCOUNTER — Encounter (HOSPITAL_COMMUNITY): Payer: Self-pay | Admitting: Surgery

## 2012-04-15 ENCOUNTER — Telehealth (INDEPENDENT_AMBULATORY_CARE_PROVIDER_SITE_OTHER): Payer: Self-pay | Admitting: General Surgery

## 2012-04-15 NOTE — Telephone Encounter (Signed)
Pt calling to request pain med refill.  Hydrocodone 5/325 mg, # 30,  1-po Q4-6H prn pain , NO refill.  Called to [facility]: [address] at [address]:  [phone].

## 2012-04-16 ENCOUNTER — Encounter (INDEPENDENT_AMBULATORY_CARE_PROVIDER_SITE_OTHER): Payer: Self-pay | Admitting: Surgery

## 2012-04-21 ENCOUNTER — Encounter (HOSPITAL_COMMUNITY): Payer: Self-pay | Admitting: Emergency Medicine

## 2012-04-21 ENCOUNTER — Emergency Department (HOSPITAL_COMMUNITY): Payer: Self-pay

## 2012-04-21 ENCOUNTER — Emergency Department (HOSPITAL_COMMUNITY)
Admission: EM | Admit: 2012-04-21 | Discharge: 2012-04-22 | Disposition: A | Discharge status: Home / Self Care | Payer: Self-pay | Attending: Emergency Medicine | Admitting: Emergency Medicine

## 2012-04-21 DIAGNOSIS — I129 Hypertensive chronic kidney disease with stage 1 through stage 4 chronic kidney disease, or unspecified chronic kidney disease: Secondary | ICD-10-CM | POA: Insufficient documentation

## 2012-04-21 DIAGNOSIS — R062 Wheezing: Secondary | ICD-10-CM | POA: Insufficient documentation

## 2012-04-21 DIAGNOSIS — R079 Chest pain, unspecified: Secondary | ICD-10-CM | POA: Insufficient documentation

## 2012-04-21 DIAGNOSIS — N189 Chronic kidney disease, unspecified: Secondary | ICD-10-CM | POA: Insufficient documentation

## 2012-04-21 DIAGNOSIS — R0602 Shortness of breath: Secondary | ICD-10-CM | POA: Insufficient documentation

## 2012-04-21 LAB — RAPID URINE DRUG SCREEN, HOSP PERFORMED
Amphetamines: NOT DETECTED
Benzodiazepines: NOT DETECTED
Cocaine: NOT DETECTED
Opiates: NOT DETECTED

## 2012-04-21 LAB — BASIC METABOLIC PANEL
CO2: 19 mEq/L (ref 19–32)
Calcium: 8.7 mg/dL (ref 8.4–10.5)
Chloride: 108 mEq/L (ref 96–112)
Glucose, Bld: 91 mg/dL (ref 70–99)
Potassium: 4.2 mEq/L (ref 3.5–5.1)
Sodium: 137 mEq/L (ref 135–145)

## 2012-04-21 LAB — CBC
Hemoglobin: 9.5 g/dL — ABNORMAL LOW (ref 13.0–17.0)
MCH: 28.4 pg (ref 26.0–34.0)
Platelets: 133 10*3/uL — ABNORMAL LOW (ref 150–400)
RBC: 3.34 MIL/uL — ABNORMAL LOW (ref 4.22–5.81)
WBC: 8.1 10*3/uL (ref 4.0–10.5)

## 2012-04-21 LAB — PRO B NATRIURETIC PEPTIDE: Pro B Natriuretic peptide (BNP): 3460 pg/mL — ABNORMAL HIGH (ref 0–125)

## 2012-04-21 LAB — TROPONIN I: Troponin I: <0.3 ng/mL (ref <0.30)

## 2012-04-21 MED ORDER — ALBUTEROL SULFATE (5 MG/ML) 0.5% IN NEBU
5.0000 mg | INHALATION_SOLUTION | Freq: Once | RESPIRATORY_TRACT | Status: AC
  Administered 2012-04-21: 5 mg via RESPIRATORY_TRACT
  Filled 2012-04-21: qty 1

## 2012-04-21 MED ORDER — NITROGLYCERIN 0.4 MG SL SUBL
0.4000 mg | SUBLINGUAL_TABLET | SUBLINGUAL | Status: DC | PRN
  Administered 2012-04-21: 0.4 mg via SUBLINGUAL
  Filled 2012-04-21: qty 25

## 2012-04-21 MED ORDER — ASPIRIN 325 MG PO TABS
325.0000 mg | ORAL_TABLET | Freq: Once | ORAL | Status: AC
  Administered 2012-04-21: 325 mg via ORAL
  Filled 2012-04-21: qty 1

## 2012-04-21 NOTE — ED Notes (Signed)
Patient transported to X-ray 

## 2012-04-21 NOTE — ED Notes (Signed)
Pt presenting to ed with c/o shortness of breath today while sitting outside. Pt states positive chest pain that radiates into his left shoulder. Pt denies nausea and vomiting.

## 2012-04-22 MED ORDER — ALBUTEROL SULFATE HFA 108 (90 BASE) MCG/ACT IN AERS
2.0000 | INHALATION_SPRAY | Freq: Once | RESPIRATORY_TRACT | Status: AC
  Administered 2012-04-22: 2 via RESPIRATORY_TRACT
  Filled 2012-04-22: qty 6.7

## 2012-04-22 MED ORDER — PREDNISONE 20 MG PO TABS
60.0000 mg | ORAL_TABLET | Freq: Once | ORAL | Status: AC
  Administered 2012-04-22: 60 mg via ORAL
  Filled 2012-04-22: qty 3

## 2012-04-22 MED ORDER — ALBUTEROL SULFATE HFA 108 (90 BASE) MCG/ACT IN AERS: 2.0000 | INHALATION_SPRAY | RESPIRATORY_TRACT | Status: DC | PRN

## 2012-04-22 NOTE — ED Provider Notes (Signed)
History     CSN: [id number]  Arrival date & time 04/21/12  4098   First MD Initiated Contact with Patient 04/21/12 1949      Chief Complaint  Patient presents with  . Shortness of Breath  . Chest Pain    The history is provided by the patient and medical records.   patient reports his been having increased cough with green sputum over the past several days.  Today he walked to the mailbox which is ongoing he typically doesn't do reports he had some shortness of breath and worsening cough when he reached the top of the driveway.  He did report some tightness in his chest as well with some radiation to his right and left shoulders.  He reports the discomfort improved with time.  The patient has a history of diastolic congestive heart failure secondary to hypertensive urgency.  He does have a history of mitral valve prolapse status post annuloplasty.  The patient reports compliance with his medications.  He reports he used to smoke cigarettes and use cocaine but has not done this for several months.  He reports no recent cocaine abuse.  He has no prior history of coronary artery disease.  He's never had heart catheterization.  He still has mild chest tightness but no longer has radiation of discomfort.  He is mild shortness of breath at this time  Past Medical History  Diagnosis Date  . Hypertension   . Bipolar disorder   . Schizophrenia     h/o hospitalized inpt therapy @ the [facility]  . 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 Hyperlipidemia   . H/O: suicide attempt 1998    was hospitalized at [facility]  . Anemia     Hx of iron infusions  . CHF (congestive heart failure)     Hx of Class IV CHF  . Gastroparesis   . Chest pain   . Abdominal pain   . Blood in stool   . N&V (nausea and vomiting)   . Generalized headaches     sinus  . Weakness   . Heart murmur   . Dysrhythmia     irreg heartbeat   . Shortness of breath     with exertion  x 2 years  . Chronic kidney disease     has fistula, not on HD  . Arthritis   . Depression     Past Surgical History  Procedure Date  . Mitral valve repair 05/2010  . Av fistula placement, radiocephalic 05/24/2011    Left arm  . Leg surgery   . Umbilical hernia repair 04/01/2012    Procedure: HERNIA REPAIR UMBILICAL ADULT;  Surgeon: Ardeth Sportsman, MD;  Location: [facility] OR;  Service: General;  Laterality: N/A;  supra umbilical    Family History  Problem Relation Age of Onset  . Hypertension Mother     History  Substance Use Topics  . Smoking status: Former Smoker -- 30 years    Types: Cigarettes    Quit date: 03/21/2011  . Smokeless tobacco: Never Used  . Alcohol Use: No     Quit alcohol in July of 2011      Review of Systems  Respiratory: Positive for shortness of breath.   Cardiovascular: Positive for chest pain.  All other systems reviewed and are negative.    Allergies  Cashew nut oil and Food  Home  Medications   Current Outpatient Rx  Name Route Sig Dispense Refill  . ARIPIPRAZOLE 5 MG PO TABS Oral Take 5 mg by mouth daily.      . ASPIRIN 81 MG PO TBEC Oral Take 81 mg by mouth daily.      . STOOL SOFTENER PO Oral Take by mouth as needed. OTC    . FUROSEMIDE 80 MG PO TABS Oral Take 80 mg by mouth 2 (two) times daily.    Marland Kitchen HYDRALAZINE HCL 25 MG PO TABS Oral Take 1 tablet (25 mg total) by mouth 3 (three) times daily. 90 tablet 11  . HYDROXYZINE PAMOATE 50 MG PO CAPS Oral Take 50 mg by mouth at bedtime.     Marland Kitchen LISINOPRIL 20 MG PO TABS Oral Take 20 mg by mouth daily.    Marland Kitchen METOPROLOL TARTRATE 50 MG PO TABS Oral Take 50 mg by mouth 2 (two) times daily.    Carma Leaven M PLUS PO TABS Oral Take 1 tablet by mouth daily.    Marland Kitchen OMEPRAZOLE 40 MG PO CPDR Oral Take 1 capsule (40 mg total) by mouth 2 (two) times daily. 60 capsule 5  . PROMETHAZINE HCL 25 MG PO TABS Oral Take 25 mg by mouth every 6 (six) hours as needed. Nausea    .  ALBUTEROL SULFATE HFA 108 (90 BASE) MCG/ACT IN AERS Inhalation Inhale 2 puffs into the lungs every 4 (four) hours as needed for wheezing. 1 Inhaler 0  . METOCLOPRAMIDE HCL 10 MG PO TABS Oral Take 1 tablet (10 mg total) by mouth 4 (four) times daily -  before meals and at bedtime. 60 tablet 5    BP 138/82  Physical Exam  Nursing note and vitals reviewed. Constitutional: He is oriented to person, place, and time. He appears well-developed and well-nourished.  HENT:  Head: Normocephalic and atraumatic.  Eyes: EOM are normal.  Neck: Normal range of motion.  Cardiovascular: Normal rate, regular rhythm, normal heart sounds and intact distal pulses.   Pulmonary/Chest: Effort normal. No respiratory distress. He has wheezes. He has no rales.  Abdominal: Soft. He exhibits no distension. There is no tenderness.  Musculoskeletal: Normal range of motion.  Neurological: He is alert and oriented to person, place, and time.  Skin: Skin is warm and dry.  Psychiatric: He has a normal mood and affect. Judgment normal.    ED Course  Procedures (including critical care time)   Date: 04/22/2012  Rate: 91  Rhythm: normal sinus rhythm  QRS Axis: normal  Intervals: normal  ST/T Wave abnormalities: normal  Conduction Disutrbances: none  Narrative Interpretation:   Old EKG Reviewed: No significant changes noted     Labs Reviewed  CBC - Abnormal; Notable for the following:    RBC 3.34 (*)    Hemoglobin 9.5 (*)    HCT 29.0 (*)    RDW 24.6 (*)    Platelets 133 (*)    All other components within normal limits  BASIC METABOLIC PANEL - Abnormal; Notable for the following:    BUN 38 (*)    Creatinine, Ser 2.46 (*)    GFR calc non Af Amer 29 (*)    GFR calc Af Amer 34 (*)    All other components within normal limits  PRO B NATRIURETIC PEPTIDE - Abnormal; Notable for the following:    Pro B Natriuretic peptide (BNP) 3460.0 (*)    All other components within normal limits  TROPONIN I  URINE RAPID  DRUG SCREEN (HOSP PERFORMED)  Dg Chest 2 View  04/21/2012  *RADIOLOGY REPORT*  Clinical Data: Chest pain and shortness of breath.  CHEST - 2 VIEW  Comparison: 02/20/2012  Findings: The cardiopericardial silhouette is enlarged.  No pneumonia or pulmonary edema.  No pleural effusion.  The patient is status post mitral valve replacement. Telemetry leads overlie the chest.  IMPRESSION: Stable.  Cardiomegaly without acute findings.  Original Report Authenticated By: ERIC A. MANSELL, M.D.     1. Chest pain       MDM  The patient has had bronchitic symptoms and feels much better after his albuterol inhaler here.  His EKG is normal.  His troponin is normal.  I did give him an aspirin and nitroglycerin on arrival.  He reports the nitroglycerin did nothing for his chest discomfort.  He has no pain at this time.  His renal sufficiency is baseline for him.  His BNP is baseline for him.  The patient would like to go home at this time.        Lyanne Co, MD 04/22/12 [phone]

## 2012-04-22 NOTE — ED Notes (Signed)
Pt reports left mid chest pain that began this a.m. While pt was at rest - pt w/ hx of mitral valve replacement and CHF - pt w/ diminished lung sounds to left lung fields, pt admits to recent hx of productive cough w/ thick white sputum. Pt speaking complete sentences, in no acute distress - family at bedside x1. Pt denies n/v/d or fever.

## 2012-04-22 NOTE — ED Notes (Signed)
Rx given x1 D/c instructions reviewed w/ pt and family - pt and family deny any further questions or concerns at present. Pt ambulating independently w/ steady gait on d/c in no acute distress, A&Ox4.  

## 2012-04-24 ENCOUNTER — Encounter (INDEPENDENT_AMBULATORY_CARE_PROVIDER_SITE_OTHER): Payer: Self-pay | Admitting: Surgery

## 2012-04-24 ENCOUNTER — Ambulatory Visit (INDEPENDENT_AMBULATORY_CARE_PROVIDER_SITE_OTHER): Payer: Self-pay | Admitting: Surgery

## 2012-04-24 VITALS — BP 140/84 | HR 80 | Temp 98.3°F | Resp 14 | Ht 63.0 in | Wt 153.4 lb

## 2012-04-24 DIAGNOSIS — K439 Ventral hernia without obstruction or gangrene: Secondary | ICD-10-CM

## 2012-04-24 MED ORDER — OXYCODONE HCL 5 MG PO TABS: 5.0000 mg | ORAL_TABLET | ORAL | Status: AC | PRN

## 2012-04-24 NOTE — Patient Instructions (Signed)
GETTING TO GOOD BOWEL HEALTH. Irregular bowel habits such as constipation and diarrhea can lead to many problems over time.  Having one soft bowel movement a day is the most important way to prevent further problems.  The anorectal canal is designed to handle stretching and feces to safely manage our ability to get rid of solid waste (feces, poop, stool) out of our body.  BUT, hard constipated stools can act like ripping concrete bricks and diarrhea can be a burning fire to this very sensitive area of our body, causing inflamed hemorrhoids, anal fissures, increasing risk is perirectal abscesses, abdominal pain/bloating, an making irritable bowel worse.     The goal: ONE SOFT BOWEL MOVEMENT A DAY!  To have soft, regular bowel movements:    Drink at least 8 tall glasses of water a day.     Take plenty of fiber.  Fiber is the undigested part of plant food that passes into the colon, acting s "natures broom" to encourage bowel motility and movement.  Fiber can absorb and hold large amounts of water. This results in a larger, bulkier stool, which is soft and easier to pass. Work gradually over several weeks up to 6 servings a day of fiber (25g a day even more if needed) in the form of: o Vegetables -- Root (potatoes, carrots, turnips), leafy green (lettuce, salad greens, celery, spinach), or cooked high residue (cabbage, broccoli, etc) o Fruit -- Fresh (unpeeled skin & pulp), Dried (prunes, apricots, cherries, etc ),  or stewed ( applesauce)  o Whole grain breads, pasta, etc (whole wheat)  o Bran cereals    Bulking Agents -- This type of water-retaining fiber generally is easily obtained each day by one of the following:  o Psyllium bran -- The psyllium plant is remarkable because its ground seeds can retain so much water. This product is available as Metamucil, Konsyl, Effersyllium, Per Diem Fiber, or the less expensive generic preparation in drug and health food stores. Although labeled a laxative, it really  is not a laxative.  o Methylcellulose -- This is another fiber derived from wood which also retains water. It is available as Citrucel. o Polyethylene Glycol - and "artificial" fiber commonly called Miralax or Glycolax.  It is helpful for people with gassy or bloated feelings with regular fiber o Flax Seed - a less gassy fiber than psyllium   No reading or other relaxing activity while on the toilet. If bowel movements take longer than 5 minutes, you are too constipated   AVOID CONSTIPATION.  High fiber and water intake usually takes care of this.  Sometimes a laxative is needed to stimulate more frequent bowel movements, but    Laxatives are not a good long-term solution as it can wear the colon out. o Osmotics (Milk of Magnesia, Fleets phosphosoda, Magnesium citrate, MiraLax, GoLytely) are safer than  o Stimulants (Senokot, Castor Oil, Dulcolax, Ex Lax)    o Do not take laxatives for more than 7days in a row.    IF SEVERELY CONSTIPATED, try a Bowel Retraining Program: o Do not use laxatives.  o Eat a diet high in roughage, such as bran cereals and leafy vegetables.  o Drink six (6) ounces of prune or apricot juice each morning.  o Eat two (2) large servings of stewed fruit each day.  o Take one (1) heaping tablespoon of a psyllium-based bulking agent twice a day. Use sugar-free sweetener when possible to avoid excessive calories.  o Eat a normal breakfast.  o   Set aside 15 minutes after breakfast to sit on the toilet, but do not strain to have a bowel movement.  o If you do not have a bowel movement by the third day, use an enema and repeat the above steps.    Controlling diarrhea o Switch to liquids and simpler foods for a few days to avoid stressing your intestines further. o Avoid dairy products (especially milk & ice cream) for a short time.  The intestines often can lose the ability to digest lactose when stressed. o Avoid foods that cause gassiness or bloating.  Typical foods include  beans and other legumes, cabbage, broccoli, and dairy foods.  Every person has some sensitivity to other foods, so listen to our body and avoid those foods that trigger problems for you. o Adding fiber (Citrucel, Metamucil, psyllium, Miralax) gradually can help thicken stools by absorbing excess fluid and retrain the intestines to act more normally.  Slowly increase the dose over a few weeks.  Too much fiber too soon can backfire and cause cramping & bloating. o Probiotics (such as active yogurt, Align, etc) may help repopulate the intestines and colon with normal bacteria and calm down a sensitive digestive tract.  Most studies show it to be of mild help, though, and such products can be costly. o Medicines:   Bismuth subsalicylate (ex. Kayopectate, Pepto Bismol) every 30 minutes for up to 6 doses can help control diarrhea.  Avoid if pregnant.   Loperamide (Immodium) can slow down diarrhea.  Start with two tablets (4mg  total) first and then try one tablet every 6 hours.  Avoid if you are having fevers or severe pain.  If you are not better or start feeling worse, stop all medicines and call your doctor for advice o Call your doctor if you are getting worse or not better.  Sometimes further testing (cultures, endoscopy, X-ray studies, bloodwork, etc) may be needed to help diagnose and treat the cause of the diarrhea.   Managing Pain  Pain after surgery or related to activity is often due to strain/injury to muscle, tendon, nerves and/or incisions.  This pain is usually short-term and will improve in a few months.   Many people find it helpful to do the following things TOGETHER to help speed the process of healing and to get back to regular activity more quickly:  1. Avoid heavy physical activity a.  no lifting greater than 20 pounds b. Do not "push through" the pain.  Listen to your body and avoid positions and maneuvers than reproduce the pain c. Walking is okay as tolerated, but go slowly and  stop when getting sore.  d. Remember: If it hurts to do it, then don't do it! 2. Take Anti-inflammatory medication  a. Take with food/snack around the clock for 1-2 weeks i. This helps the muscle and nerve tissues become less irritable and calm down faster b. Choose ONE of the following over-the-counter medications: i. Naproxen 220mg  tabs (ex. Aleve) 1-2 pills twice a day  ii. Ibuprofen 200mg  tabs (ex. Advil, Motrin) 3-4 pills with every meal and just before bedtime iii. Acetaminophen 500mg  tabs (Tylenol) 2 pills with every meal and just before bedtime 3. Use a Heating pad or Ice/Cold Pack a. 4-6 times a day b. May use warm bath/hottub  or showers 4. Try Gentle Massage and/or Stretching  a. at the area of pain many times a day b. stop if you feel pain - do not overdo it  Try these steps together to  help you body heal faster and avoid making things get worse.  Doing just one of these things may not be enough.    If you are not getting better after two weeks or are noticing you are getting worse, contact our office for further advice; we may need to re-evaluate you & see what other things we can do to help.   

## 2012-04-24 NOTE — Progress Notes (Signed)
Subjective:     Patient ID: Ryan Winters, male   DOB: 03-30-1961, 51 y.o.   MRN: [id number]  HPI  Ryan Winters  Feb 09, 1961 [id number]  Patient Care Team: Jackie Plum, MD as PCP - General (Internal Medicine)  This patient is a 51 y.o.male who presents today for surgical evaluation.   Procedure open repair with mesh reinforcement of supraumbilical hernias x2, 03/31/2012  The patient comes in today doing relatively well. Still with soreness. Tapering off narcotics but still needing those. Using a heating pad from time to time also.  Constipated. Using stool softeners. Walking better. Had a asthma flare in the ER earlier this week. Gradually feels like he is improving.    Patient Active Problem List  Diagnoses  . Anemia in chronic kidney disease  . SCHIZOPHRENIA  . BIPOLAR AFFECTIVE DISORDER  . HTN (hypertension), malignant  . Mitral valve disorders  . Acute on chronic diastolic heart failure  . HEMATURIA UNSPECIFIED  . CARDIAC MURMUR  . COCAINE ABUSE, HX OF  . Rectal bleeding  . Abdominal pain  . CKD (chronic kidney disease), stage IV  . Mitral valvular regurgitation  . Ascites  . Constipation  . Chest pain  . GERD (gastroesophageal reflux disease)  . Substance abuse  . Atypical chest pain  . Gastroparesis  . T wave inversion in EKG  . Schizophrenia  . N&V (nausea and vomiting)    Past Medical History  Diagnosis Date  . Hypertension   . Bipolar disorder   . Schizophrenia     h/o hospitalized inpt therapy @ the [facility]  . 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 Hyperlipidemia   . H/O: suicide attempt 1998    was hospitalized at [facility]  . Anemia     Hx of iron infusions  . CHF (congestive heart failure)     Hx of Class IV CHF  . Gastroparesis   . Chest pain   . Abdominal pain   . Blood in stool   . N&V (nausea and vomiting)   . Generalized headaches    sinus  . Weakness   . Heart murmur   . Dysrhythmia     irreg heartbeat  . Shortness of breath     with exertion  x 2 years  . Chronic kidney disease     has fistula, not on HD  . Arthritis   . Depression     Past Surgical History  Procedure Date  . Mitral valve repair 05/2010  . Av fistula placement, radiocephalic 05/24/2011    Left arm  . Leg surgery   . Umbilical hernia repair 04/01/2012    Procedure: HERNIA REPAIR UMBILICAL ADULT;  Surgeon: Ardeth Sportsman, MD;  Location: [facility] OR;  Service: General;  Laterality: N/A;  supra umbilical    History   Social History  . Marital Status: Married    Spouse Name: N/A    Number of Children: N/A  . Years of Education: N/A   Occupational History  . disabled    Social History Main Topics  . Smoking status: Former Smoker -- 30 years    Types: Cigarettes    Quit date: 03/21/2011  . Smokeless tobacco: Never Used  . Alcohol Use: No     Quit alcohol in July of 2011  . Drug Use: No     None since November when had rectal bleeding and went  to hospital  . Sexually Active: No   Other Topics Concern  . Not on file   Social History Narrative   He lives at home with his wife and has a sister. He denies any cigs, drugs, alcohol at present.     Family History  Problem Relation Age of Onset  . Hypertension Mother     Current Outpatient Prescriptions  Medication Sig Dispense Refill  . albuterol (PROVENTIL HFA;VENTOLIN HFA) 108 (90 BASE) MCG/ACT inhaler Inhale 2 puffs into the lungs every 4 (four) hours as needed for wheezing.  1 Inhaler  0  . ARIPiprazole (ABILIFY) 5 MG tablet Take 5 mg by mouth daily.        Marland Kitchen aspirin 81 MG EC tablet Take 81 mg by mouth daily.        [name] Calcium (STOOL SOFTENER PO) Take by mouth as needed. OTC      . furosemide (LASIX) 80 MG tablet Take 80 mg by mouth 2 (two) times daily.      . hydrALAZINE (APRESOLINE) 25 MG tablet Take 1 tablet (25 mg total) by mouth 3 (three) times daily.  90 tablet  11    . hydrOXYzine (VISTARIL) 50 MG capsule Take 50 mg by mouth at bedtime.       Marland Kitchen lisinopril (PRINIVIL,ZESTRIL) 20 MG tablet Take 20 mg by mouth daily.      . metoprolol (LOPRESSOR) 50 MG tablet Take 50 mg by mouth 2 (two) times daily.      . Multiple Vitamins-Minerals (MULTIVITAMINS THER. W/MINERALS) TABS Take 1 tablet by mouth daily.      Marland Kitchen omeprazole (PRILOSEC) 40 MG capsule Take 1 capsule (40 mg total) by mouth 2 (two) times daily.  60 capsule  5  . promethazine (PHENERGAN) 25 MG tablet Take 25 mg by mouth every 6 (six) hours as needed. Nausea      . metoCLOPramide (REGLAN) 10 MG tablet Take 1 tablet (10 mg total) by mouth 4 (four) times daily -  before meals and at bedtime.  60 tablet  5  . oxyCODONE (OXY IR/ROXICODONE) 5 MG immediate release tablet Take 1-2 tablets (5-10 mg total) by mouth every 4 (four) hours as needed for pain.  40 tablet  0     Allergies  Allergen Reactions  . Cashew Nut Oil Anaphylaxis  . Food Anaphylaxis    Strawberries, Cashews, causes throat to swell - affects breathing    BP 140/84  Pulse 80  Temp 98.3 F (36.8 C)  Resp 14  Ht 5\' 3"  (1.6 m)  Wt 153 lb 6.4 oz (69.582 kg)  BMI 27.17 kg/m2  Dg Chest 2 View  04/21/2012  *RADIOLOGY REPORT*  Clinical Data: Chest pain and shortness of breath.  CHEST - 2 VIEW  Comparison: 02/20/2012  Findings: The cardiopericardial silhouette is enlarged.  No pneumonia or pulmonary edema.  No pleural effusion.  The patient is status post mitral valve replacement. Telemetry leads overlie the chest.  IMPRESSION: Stable.  Cardiomegaly without acute findings.  Original Report Authenticated By: ERIC A. MANSELL, M.D.     Review of Systems  Constitutional: Negative for fever, chills and diaphoresis.  HENT: Negative for sore throat, trouble swallowing and neck pain.   Eyes: Negative for photophobia and visual disturbance.  Respiratory: Negative for choking and shortness of breath.   Cardiovascular: Negative for chest pain and  palpitations.  Gastrointestinal: Positive for abdominal pain and constipation. Negative for nausea, vomiting, abdominal distention, anal bleeding and rectal pain.  Genitourinary:  Negative for dysuria, urgency, difficulty urinating and testicular pain.  Musculoskeletal: Negative for myalgias, arthralgias and gait problem.  Skin: Negative for color change and rash.  Neurological: Negative for dizziness, speech difficulty, weakness and numbness.  Hematological: Negative for adenopathy.  Psychiatric/Behavioral: Negative for hallucinations, confusion and agitation.       Objective:   Physical Exam  Constitutional: He is oriented to person, place, and time. He appears well-developed and well-nourished. No distress.  HENT:  Head: Normocephalic.  Mouth/Throat: Oropharynx is clear and moist. No oropharyngeal exudate.  Eyes: Conjunctivae and EOM are normal. Pupils are equal, round, and reactive to light. No scleral icterus.  Neck: Normal range of motion. Neck supple. No tracheal deviation present.  Cardiovascular: Normal rate, regular rhythm and intact distal pulses.   Pulmonary/Chest: Effort normal and breath sounds normal. No respiratory distress.  Abdominal: Soft. He exhibits no distension and no mass. There is tenderness. There is no guarding. Hernia confirmed negative in the right inguinal area and confirmed negative in the left inguinal area.    Musculoskeletal: Normal range of motion. He exhibits no tenderness.  Lymphadenopathy:    He has no cervical adenopathy.       Right: No inguinal adenopathy present.       Left: No inguinal adenopathy present.  Neurological: He is alert and oriented to person, place, and time. No cranial nerve deficit. He exhibits normal muscle tone. Coordination normal.  Skin: Skin is warm and dry. No rash noted. He is not diaphoretic. No erythema. No pallor.  Psychiatric: He has a normal mood and affect. His behavior is normal. Judgment and thought content normal.        Assessment:     3 weeks s/p open VWH repair, improving    Plan:     Increase activity as tolerated.  Do not push through pain.  Tried Tylenol round-the-clock. Reviewed oxycodone #40 for last refill.  Advanced on diet as tolerated. Bowel regimen to avoid problems.  Try fiber supplement instead  Return to clinic p.r.n. The patient expressed understanding and appreciation

## 2012-05-09 ENCOUNTER — Ambulatory Visit (INDEPENDENT_AMBULATORY_CARE_PROVIDER_SITE_OTHER): Payer: Self-pay | Admitting: Nurse Practitioner

## 2012-05-09 ENCOUNTER — Inpatient Hospital Stay (HOSPITAL_COMMUNITY)
Admission: AD | Admit: 2012-05-09 | Discharge: 2012-05-11 | DRG: 292 | Disposition: A | Discharge status: Home / Self Care | Payer: Medicaid Other | Source: Ambulatory Visit | Attending: Cardiovascular Disease | Admitting: Cardiovascular Disease

## 2012-05-09 ENCOUNTER — Encounter: Payer: Self-pay | Admitting: Nurse Practitioner

## 2012-05-09 ENCOUNTER — Other Ambulatory Visit: Payer: Self-pay | Admitting: Nurse Practitioner

## 2012-05-09 VITALS — BP 142/100 | HR 88 | Ht 64.0 in | Wt 154.6 lb

## 2012-05-09 DIAGNOSIS — R0989 Other specified symptoms and signs involving the circulatory and respiratory systems: Secondary | ICD-10-CM

## 2012-05-09 DIAGNOSIS — I079 Rheumatic tricuspid valve disease, unspecified: Secondary | ICD-10-CM | POA: Diagnosis present

## 2012-05-09 DIAGNOSIS — I5030 Unspecified diastolic (congestive) heart failure: Secondary | ICD-10-CM

## 2012-05-09 DIAGNOSIS — I5033 Acute on chronic diastolic (congestive) heart failure: Principal | ICD-10-CM | POA: Diagnosis present

## 2012-05-09 DIAGNOSIS — F3189 Other bipolar disorder: Secondary | ICD-10-CM | POA: Diagnosis present

## 2012-05-09 DIAGNOSIS — Z79899 Other long term (current) drug therapy: Secondary | ICD-10-CM

## 2012-05-09 DIAGNOSIS — I129 Hypertensive chronic kidney disease with stage 1 through stage 4 chronic kidney disease, or unspecified chronic kidney disease: Secondary | ICD-10-CM | POA: Diagnosis present

## 2012-05-09 DIAGNOSIS — E785 Hyperlipidemia, unspecified: Secondary | ICD-10-CM | POA: Diagnosis present

## 2012-05-09 DIAGNOSIS — F209 Schizophrenia, unspecified: Secondary | ICD-10-CM | POA: Diagnosis present

## 2012-05-09 DIAGNOSIS — I340 Nonrheumatic mitral (valve) insufficiency: Secondary | ICD-10-CM

## 2012-05-09 DIAGNOSIS — I5032 Chronic diastolic (congestive) heart failure: Secondary | ICD-10-CM

## 2012-05-09 DIAGNOSIS — I059 Rheumatic mitral valve disease, unspecified: Secondary | ICD-10-CM | POA: Diagnosis present

## 2012-05-09 DIAGNOSIS — N189 Chronic kidney disease, unspecified: Secondary | ICD-10-CM | POA: Diagnosis present

## 2012-05-09 DIAGNOSIS — N039 Chronic nephritic syndrome with unspecified morphologic changes: Secondary | ICD-10-CM | POA: Diagnosis present

## 2012-05-09 DIAGNOSIS — I509 Heart failure, unspecified: Secondary | ICD-10-CM | POA: Diagnosis present

## 2012-05-09 DIAGNOSIS — D631 Anemia in chronic kidney disease: Secondary | ICD-10-CM | POA: Diagnosis present

## 2012-05-09 DIAGNOSIS — Z7982 Long term (current) use of aspirin: Secondary | ICD-10-CM

## 2012-05-09 LAB — DIFFERENTIAL
Basophils Absolute: 0 10*3/uL (ref 0.0–0.1)
Basophils Relative: 1 % (ref 0–1)
Eosinophils Absolute: 0.1 10*3/uL (ref 0.0–0.7)
Eosinophils Relative: 2 % (ref 0–5)
Lymphocytes Relative: 15 % (ref 12–46)
Lymphs Abs: 0.9 10*3/uL (ref 0.7–4.0)
Monocytes Absolute: 0.5 10*3/uL (ref 0.1–1.0)
Monocytes Relative: 8 % (ref 3–12)
Neutro Abs: 4.7 10*3/uL (ref 1.7–7.7)
Neutrophils Relative %: 75 % (ref 43–77)

## 2012-05-09 LAB — CARDIAC PANEL(CRET KIN+CKTOT+MB+TROPI)
CK, MB: 3.7 ng/mL (ref 0.3–4.0)
Relative Index: 2.5 (ref 0.0–2.5)
Total CK: 147 U/L (ref 7–232)
Troponin I: <0.3 ng/mL (ref <0.30)

## 2012-05-09 LAB — COMPREHENSIVE METABOLIC PANEL
ALT: 26 U/L (ref 0–53)
AST: 49 U/L — ABNORMAL HIGH (ref 0–37)
Albumin: 3.2 g/dL — ABNORMAL LOW (ref 3.5–5.2)
Alkaline Phosphatase: 154 U/L — ABNORMAL HIGH (ref 39–117)
BUN: 45 mg/dL — ABNORMAL HIGH (ref 6–23)
CO2: 20 mEq/L (ref 19–32)
Calcium: 9 mg/dL (ref 8.4–10.5)
Chloride: 105 mEq/L (ref 96–112)
Creatinine, Ser: 2.92 mg/dL — ABNORMAL HIGH (ref 0.50–1.35)
GFR calc Af Amer: 27 mL/min — ABNORMAL LOW (ref >90)
GFR calc non Af Amer: 24 mL/min — ABNORMAL LOW (ref >90)
Glucose, Bld: 81 mg/dL (ref 70–99)
Potassium: 4.1 mEq/L (ref 3.5–5.1)
Sodium: 137 mEq/L (ref 135–145)
Total Bilirubin: 1.8 mg/dL — ABNORMAL HIGH (ref 0.3–1.2)
Total Protein: 6.4 g/dL (ref 6.0–8.3)

## 2012-05-09 LAB — PROTIME-INR
INR: 1.21 (ref 0.00–1.49)
Prothrombin Time: 15.6 seconds — ABNORMAL HIGH (ref 11.6–15.2)

## 2012-05-09 LAB — CBC
HCT: 28.4 % — ABNORMAL LOW (ref 39.0–52.0)
Hemoglobin: 9.2 g/dL — ABNORMAL LOW (ref 13.0–17.0)
MCH: 28.5 pg (ref 26.0–34.0)
MCHC: 32.4 g/dL (ref 30.0–36.0)
MCV: 87.9 fL (ref 78.0–100.0)
Platelets: 156 10*3/uL (ref 150–400)
RBC: 3.23 MIL/uL — ABNORMAL LOW (ref 4.22–5.81)
RDW: 24.6 % — ABNORMAL HIGH (ref 11.5–15.5)
WBC: 6.4 10*3/uL (ref 4.0–10.5)

## 2012-05-09 LAB — APTT: aPTT: 31 seconds (ref 24–37)

## 2012-05-09 LAB — MAGNESIUM: Magnesium: 1.9 mg/dL (ref 1.5–2.5)

## 2012-05-09 MED ORDER — ACETAMINOPHEN 325 MG PO TABS
650.0000 mg | ORAL_TABLET | ORAL | Status: DC | PRN
  Administered 2012-05-11: 650 mg via ORAL
  Filled 2012-05-09: qty 2

## 2012-05-09 MED ORDER — ONDANSETRON HCL 4 MG/2ML IJ SOLN: 4.0000 mg | Freq: Four times a day (QID) | INTRAMUSCULAR | Status: DC | PRN

## 2012-05-09 MED ORDER — ARIPIPRAZOLE 5 MG PO TABS
5.0000 mg | ORAL_TABLET | Freq: Every day | ORAL | Status: DC
  Administered 2012-05-10: 5 mg via ORAL
  Filled 2012-05-09 (×3): qty 1

## 2012-05-09 MED ORDER — ASPIRIN EC 81 MG PO TBEC
81.0000 mg | DELAYED_RELEASE_TABLET | Freq: Every day | ORAL | Status: DC
  Administered 2012-05-10: 81 mg via ORAL
  Filled 2012-05-09 (×3): qty 1

## 2012-05-09 MED ORDER — SODIUM CHLORIDE 0.9 % IJ SOLN: 3.0000 mL | INTRAMUSCULAR | Status: DC | PRN

## 2012-05-09 MED ORDER — LISINOPRIL 20 MG PO TABS
20.0000 mg | ORAL_TABLET | Freq: Every day | ORAL | Status: DC
  Administered 2012-05-10: 20 mg via ORAL
  Filled 2012-05-09 (×2): qty 1

## 2012-05-09 MED ORDER — SODIUM CHLORIDE 0.9 % IV SOLN: 250.0000 mL | INTRAVENOUS | Status: DC | PRN

## 2012-05-09 MED ORDER — HYDROXYZINE HCL 50 MG PO TABS
50.0000 mg | ORAL_TABLET | Freq: Every evening | ORAL | Status: DC | PRN
  Filled 2012-05-09: qty 1

## 2012-05-09 MED ORDER — ALBUTEROL SULFATE HFA 108 (90 BASE) MCG/ACT IN AERS
2.0000 | INHALATION_SPRAY | RESPIRATORY_TRACT | Status: DC | PRN
  Filled 2012-05-09: qty 6.7

## 2012-05-09 MED ORDER — SODIUM CHLORIDE 0.9 % IJ SOLN
3.0000 mL | Freq: Two times a day (BID) | INTRAMUSCULAR | Status: DC
  Administered 2012-05-09 – 2012-05-10 (×3): 3 mL via INTRAVENOUS

## 2012-05-09 MED ORDER — PANTOPRAZOLE SODIUM 40 MG PO TBEC
80.0000 mg | DELAYED_RELEASE_TABLET | Freq: Every day | ORAL | Status: DC
  Administered 2012-05-10: 80 mg via ORAL
  Filled 2012-05-09 (×2): qty 1

## 2012-05-09 MED ORDER — ADULT MULTIVITAMIN W/MINERALS CH
1.0000 | ORAL_TABLET | Freq: Every day | ORAL | Status: DC
  Administered 2012-05-10: 1 via ORAL
  Filled 2012-05-09 (×2): qty 1

## 2012-05-09 MED ORDER — HYDRALAZINE HCL 25 MG PO TABS
25.0000 mg | ORAL_TABLET | Freq: Three times a day (TID) | ORAL | Status: DC
  Administered 2012-05-09 – 2012-05-10 (×2): 25 mg via ORAL
  Filled 2012-05-09 (×5): qty 1

## 2012-05-09 MED ORDER — DOCUSATE SODIUM 100 MG PO CAPS
100.0000 mg | ORAL_CAPSULE | Freq: Every day | ORAL | Status: DC | PRN
  Administered 2012-05-09 – 2012-05-10 (×2): 100 mg via ORAL
  Filled 2012-05-09 (×2): qty 1

## 2012-05-09 MED ORDER — ENOXAPARIN SODIUM 30 MG/0.3ML ~~LOC~~ SOLN
30.0000 mg | SUBCUTANEOUS | Status: DC
  Administered 2012-05-09 – 2012-05-10 (×2): 30 mg via SUBCUTANEOUS
  Filled 2012-05-09 (×3): qty 0.3

## 2012-05-09 MED ORDER — METOCLOPRAMIDE HCL 10 MG PO TABS
10.0000 mg | ORAL_TABLET | Freq: Three times a day (TID) | ORAL | Status: DC
  Administered 2012-05-09 – 2012-05-10 (×5): 10 mg via ORAL
  Filled 2012-05-09 (×11): qty 1

## 2012-05-09 MED ORDER — FUROSEMIDE 10 MG/ML IJ SOLN
80.0000 mg | Freq: Two times a day (BID) | INTRAMUSCULAR | Status: DC
  Administered 2012-05-09 – 2012-05-11 (×4): 80 mg via INTRAVENOUS
  Filled 2012-05-09 (×6): qty 8

## 2012-05-09 MED ORDER — METOPROLOL TARTRATE 50 MG PO TABS
50.0000 mg | ORAL_TABLET | Freq: Two times a day (BID) | ORAL | Status: DC
  Administered 2012-05-09 – 2012-05-10 (×3): 50 mg via ORAL
  Filled 2012-05-09 (×5): qty 1

## 2012-05-09 NOTE — Progress Notes (Addendum)
Ryan Winters Date of Birth: 08-Mar-1961 Medical Record #[id number]  History of Present Illness: [name] is seen today for a work in visit. He is seen for Dr. Excell Seltzer. He has multiple medical issues with known moderate MR and prior MV repair in 2011, CHF and CKD. He also has gastroparesis. His last echo was this past February. He had moderate MR.   He comes in today. He is here with his wife. He has not felt well over the past 1 to 2 weeks. He has progressive DOE and is even dyspneic at rest. He is sitting up to sleep. Has PND. He is coughing. Some yellow sputum. No actual fever but feels cold. Abdomen is bloated. Early satiety reported. Also with some chest pain and feels like his left arm is numb. He says he is dizzy. He did see Renal last week and was told to come here for further evaluation. He does have his fistua placed but says he is not nearing the time for dialysis. Says he is taking his medicines. No extra salt used. He did have an umbilical hernia surgery last month and says he did fine with that. Bowels are ok.   Current Outpatient Prescriptions on File Prior to Visit  Medication Sig Dispense Refill  . albuterol (PROVENTIL HFA;VENTOLIN HFA) 108 (90 BASE) MCG/ACT inhaler Inhale 2 puffs into the lungs every 4 (four) hours as needed for wheezing.  1 Inhaler  0  . ARIPiprazole (ABILIFY) 5 MG tablet Take 5 mg by mouth daily.        Marland Kitchen aspirin 81 MG EC tablet Take 81 mg by mouth daily.        [name] Calcium (STOOL SOFTENER PO) Take by mouth as needed. OTC        . furosemide (LASIX) 80 MG tablet Take 80 mg by mouth 2 (two) times daily.      . hydrALAZINE (APRESOLINE) 25 MG tablet Take 1 tablet (25 mg total) by mouth 3 (three) times daily.  90 tablet  11  . hydrOXYzine (VISTARIL) 50 MG capsule Take 50 mg by mouth at bedtime.       Marland Kitchen lisinopril (PRINIVIL,ZESTRIL) 20 MG tablet Take 20 mg by mouth daily.      . metoCLOPramide (REGLAN) 10 MG tablet Take 1 tablet (10 mg total) by mouth 4  (four) times daily -  before meals and at bedtime.  60 tablet  5  . metoprolol (LOPRESSOR) 50 MG tablet Take 50 mg by mouth 2 (two) times daily.      . Multiple Vitamins-Minerals (MULTIVITAMINS THER. W/MINERALS) TABS Take 1 tablet by mouth daily.      Marland Kitchen omeprazole (PRILOSEC) 40 MG capsule Take 1 capsule (40 mg total) by mouth 2 (two) times daily.  60 capsule  5  . promethazine (PHENERGAN) 25 MG tablet Take 25 mg by mouth every 6 (six) hours as needed. Nausea         Allergies  Allergen Reactions  . Cashew Nut Oil Anaphylaxis  . Food Anaphylaxis    Strawberries, Cashews, causes throat to swell - affects breathing    Past Medical History  Diagnosis Date  . Hypertension   . Bipolar disorder   . Schizophrenia     h/o hospitalized inpt therapy @ the [facility]  . 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 Hyperlipidemia   . H/O: suicide attempt 1998  was hospitalized at [facility]  . Anemia     Hx of iron infusions  . CHF (congestive heart failure)     Hx of Class IV CHF  . Gastroparesis   . Chest pain   . Abdominal pain   . Blood in stool   . N&V (nausea and vomiting)   . Generalized headaches     sinus  . Weakness   . Heart murmur   . Dysrhythmia     irreg heartbeat  . Shortness of breath     with exertion  x 2 years  . Chronic kidney disease     has fistula, not on HD  . Arthritis   . Depression     Past Surgical History  Procedure Date  . Mitral valve repair 05/2010  . Av fistula placement, radiocephalic 05/24/2011    Left arm  . Leg surgery   . Umbilical hernia repair 04/01/2012    Procedure: HERNIA REPAIR UMBILICAL ADULT;  Surgeon: Ardeth Sportsman, MD;  Location: [facility] OR;  Service: General;  Laterality: N/A;  supra umbilical    History  Smoking status  . Former Smoker -- 30 years  . Types: Cigarettes  . Quit date: 03/21/2011  Smokeless tobacco  . Never Used    History    Alcohol Use No    Quit alcohol in July of 2011    Family History  Problem Relation Age of Onset  . Hypertension Mother     Review of Systems: The review of systems is per the HPI.  All other systems were reviewed and are negative.  Physical Exam: BP 142/100  Pulse 88  Ht 5\' 4"  (1.626 m)  Wt 154 lb 9.6 oz (70.126 kg)  BMI 26.54 kg/m2 Patient is very pleasant and in mild distress. He is short of breath at rest. He has a cough noted. Oxygen sat was 99%. Skin is warm and dry. Color is normal.  HEENT is unremarkable. Normocephalic/atraumatic. PERRL. Sclera are nonicteric. Neck is supple. No masses. Neck veins are elevated sitting upright.  Lungs are fairly clear. Cardiac exam shows a regular rate and rhythm. He is tachycardic on exam. He has a very loud systolic murmur. Abdomen is protruberant and firm to touch with decreased bowel sounds. Extremities are without edema. Gait and ROM are intact. He does use a cane. No gross neurologic deficits noted.  LABORATORY DATA: EKG shows sinus rhythm. Rate of 95. Unchanged from prior tracing.   Echo Study Conclusions from February 2013  - Left ventricle: The cavity size was normal. Wall thickness was normal. The estimated ejection fraction was 55%. - Mitral valve: S/P repair with moderate residual MR Valve area by pressure half-time: 2.2cm^2. Valve area by continuity equation (using LVOT flow): 1.06cm^2. - Left atrium: The atrium was moderately dilated. - Right ventricle: The cavity size was mildly dilated. - Right atrium: The atrium was mildly dilated. - Atrial septum: No defect or patent foramen ovale was identified. - Tricuspid valve: Severe regurgitation. - Pulmonary arteries: PA peak pressure: 47mm Hg (S).  Lab Results  Component Value Date   WBC 8.1 04/21/2012   HGB 9.5* 04/21/2012   HCT 29.0* 04/21/2012   PLT 133* 04/21/2012   GLUCOSE 91 04/21/2012   CHOL  Value: 134        ATP III CLASSIFICATION:  <200     mg/dL   Desirable  161-096   mg/dL   Borderline High  >=045    mg/dL   High  05/25/2010   TRIG 39 05/25/2010   HDL 63 05/25/2010   LDLCALC  Value: 63        Total Cholesterol/HDL:CHD Risk Coronary Heart Disease Risk Table                     Men   Women  1/2 Average Risk   3.4   3.3  Average Risk       5.0   4.4  2 X Average Risk   9.6   7.1  3 X Average Risk  23.4   11.0        Use the calculated Patient Ratio above and the CHD Risk Table to determine the patient's CHD Risk.        ATP III CLASSIFICATION (LDL):  <100     mg/dL   Optimal  409-811  mg/dL   Near or Above                    Optimal  130-159  mg/dL   Borderline  914-782  mg/dL   High  >956     mg/dL   Very High 12/20/3084   ALT 23 03/31/2012   AST 49* 03/31/2012   NA 137 04/21/2012   K 4.2 04/21/2012   CL 108 04/21/2012   CREATININE 2.46* 04/21/2012   BUN 38* 04/21/2012   CO2 19 04/21/2012   TSH 2.297 02/21/2012   INR 1.16 01/04/2012   HGBA1C  Value: 5.4 (NOTE)                                                                       According to the ADA Clinical Practice Recommendations for 2011, when HbA1c is used as a screening test:   >=6.5%   Diagnostic of Diabetes Mellitus           (if abnormal result  is confirmed)  5.7-6.4%   Increased risk of developing Diabetes Mellitus  References:Diagnosis and Classification of Diabetes Mellitus,Diabetes Care,2011,34(Suppl 1):S62-S69 and Standards of Medical Care in         Diabetes - 2011,Diabetes Care,2011,34  (Suppl 1):S11-S61. 05/28/2010   MICROALBUR 0.64 05/25/2010      Assessment / Plan:  Addendum: I have seen and examined mr Rothgeb with Norma Fredrickson, NP. He will be admitted for diuresis, labs and echo. I agree with her plan. cdm MCALHANY,CHRISTOPHER 5:32 PM 05/09/2012

## 2012-05-09 NOTE — H&P (Signed)
Hulet Ehrmann   05/09/2012 3:00 PM Office Visit  MRN: [id number]   Description: 51 year old male  Provider: Norma Fredrickson, NP  Department: [facility]        Referring Provider     Jackie Plum, MD      Diagnoses     Acute on chronic diastolic heart failure   - Primary    428.33      Reason for Visit     Shortness of Breath    Work in visit. Seen for Dr. Excell Seltzer    Chest Pain         Progress Notes     Norma Fredrickson, NP  05/09/2012  3:46 PM  Addendum    Lowell Bouton Date of Birth: Mar 08, 1961 Medical Record #[id number]   History of Present Illness: Mr. Holquin is seen today for a work in visit. He is seen for Dr. Excell Seltzer. He has multiple medical issues with known moderate MR and prior MV repair in 2011, CHF and CKD. He also has gastroparesis. His last echo was this past February. He had moderate MR. Has had a history of multi substance abuse.    He comes in today. He is here with his wife. He has not felt well over the past 1 to 2 weeks. He has progressive DOE and is even dyspneic at rest. He is sitting up to sleep. Has PND. He is coughing. Some yellow sputum. No actual fever but feels cold. Abdomen is bloated. Early satiety reported. Also with some chest pain and feels like his left arm is numb. He says he is dizzy. He did see Renal last week and was told to come here for further evaluation. He does have his fistua placed but says he is not nearing the time for dialysis. Says he is taking his medicines. No extra salt used. He did have an umbilical hernia surgery last month and says he did fine with that. Bowels are ok. No recent dental procedures.     Current Outpatient Prescriptions on File Prior to Visit   Medication  Sig  Dispense  Refill   .  albuterol (PROVENTIL HFA;VENTOLIN HFA) 108 (90 BASE) MCG/ACT inhaler  Inhale 2 puffs into the lungs every 4 (four) hours as needed for wheezing.   1 Inhaler   0   .  ARIPiprazole (ABILIFY) 5 MG tablet  Take 5 mg  by mouth daily.           Marland Kitchen  aspirin 81 MG EC tablet  Take 81 mg by mouth daily.           [name] Calcium (STOOL SOFTENER PO)  Take by mouth as needed. OTC            .  furosemide (LASIX) 80 MG tablet  Take 80 mg by mouth 2 (two) times daily.         .  hydrALAZINE (APRESOLINE) 25 MG tablet  Take 1 tablet (25 mg total) by mouth 3 (three) times daily.   90 tablet   11   .  hydrOXYzine (VISTARIL) 50 MG capsule  Take 50 mg by mouth at bedtime.          Marland Kitchen  lisinopril (PRINIVIL,ZESTRIL) 20 MG tablet  Take 20 mg by mouth daily.         .  metoCLOPramide (REGLAN) 10 MG tablet  Take 1 tablet (10 mg total) by mouth 4 (four) times daily -  before meals and  at bedtime.   60 tablet   5   .  metoprolol (LOPRESSOR) 50 MG tablet  Take 50 mg by mouth 2 (two) times daily.         .  Multiple Vitamins-Minerals (MULTIVITAMINS THER. W/MINERALS) TABS  Take 1 tablet by mouth daily.         Marland Kitchen  omeprazole (PRILOSEC) 40 MG capsule  Take 1 capsule (40 mg total) by mouth 2 (two) times daily.   60 capsule   5   .  promethazine (PHENERGAN) 25 MG tablet  Take 25 mg by mouth every 6 (six) hours as needed. Nausea              Allergies   Allergen  Reactions   .  Cashew Nut Oil  Anaphylaxis   .  Food  Anaphylaxis       Strawberries, Cashews, causes throat to swell - affects breathing       Past Medical History   Diagnosis  Date   .  Hypertension     .  Bipolar disorder     .  Schizophrenia         h/o hospitalized inpt therapy @ the [facility]   .  MVP (mitral valve prolapse)         with severe MR s/p repair with annuloplasty and cleft MV repair in 09/2010   .  Hyperlipidemia     .  H/O: suicide attempt  1998       was hospitalized at [facility]   .  Anemia         Hx of iron infusions   .  CHF (congestive heart failure)         Hx of Class IV CHF   .  Gastroparesis     .  Chest pain     .  Abdominal pain     .  Blood in stool     .  N&V (nausea and vomiting)     .  Generalized  headaches         sinus   .  Weakness     .  Heart murmur     .  Dysrhythmia         irreg heartbeat   .  Shortness of breath         with exertion  x 2 years   .  Chronic kidney disease         has fistula, not on HD   .  Arthritis     .  Depression         Past Surgical History   Procedure  Date   .  Mitral valve repair  05/2010   .  Av fistula placement, radiocephalic  05/24/2011       Left arm   .  Leg surgery     .  Umbilical hernia repair  04/01/2012       Procedure: HERNIA REPAIR UMBILICAL ADULT;  Surgeon: Ardeth Sportsman, MD;  Location: [facility] OR;  Service: General;  Laterality: N/A;  supra umbilical       History   Smoking status   .  Former Smoker -- 30 years   .  Types:  Cigarettes   .  Quit date:  03/21/2011   Smokeless tobacco   .  Never Used       History   Alcohol Use  No       Quit alcohol  in July of 2011       Family History   Problem  Relation  Age of Onset   .  Hypertension  Mother        Review of Systems: The review of systems is per the HPI.  All other systems were reviewed and are negative.   Physical Exam: BP 142/100  Pulse 88  Ht 5\' 4"  (1.626 m)  Wt 154 lb 9.6 oz (70.126 kg)  BMI 26.54 kg/m2 Patient is very pleasant and in mild distress. He is short of breath at rest. He has a cough noted. Oxygen sat was 99%. Skin is warm and dry. Color is normal.  HEENT is unremarkable. Normocephalic/atraumatic. PERRL. Sclera are nonicteric. Neck is supple. No masses. Neck veins are elevated sitting upright.  Lungs are fairly clear. Cardiac exam shows a regular rate and rhythm. He is tachycardic on exam. He has a very loud systolic murmur. Abdomen is protruberant and firm to touch with decreased bowel sounds. Extremities are without edema. Gait and ROM are intact. He does use a cane. No gross neurologic deficits noted.   LABORATORY DATA: EKG shows sinus rhythm. Rate of 95. Unchanged from prior tracing. Reviewed with Dr. Clifton James.    Echo Study  Conclusions from February 2013  - Left ventricle: The cavity size was normal. Wall thickness was normal. The estimated ejection fraction was 55%. - Mitral valve: S/P repair with moderate residual MR Valve area by pressure half-time: 2.2cm^2. Valve area by continuity equation (using LVOT flow): 1.06cm^2. - Left atrium: The atrium was moderately dilated. - Right ventricle: The cavity size was mildly dilated. - Right atrium: The atrium was mildly dilated. - Atrial septum: No defect or patent foramen ovale was identified. - Tricuspid valve: Severe regurgitation. - Pulmonary arteries: PA peak pressure: 47mm Hg (S).  Lab Results   Component  Value  Date     WBC  8.1  04/21/2012     HGB  9.5*  04/21/2012     HCT  29.0*  04/21/2012     PLT  133*  04/21/2012     GLUCOSE  91  04/21/2012     CHOL   Value: 134        ATP III CLASSIFICATION:  <200     mg/dL   Desirable  147-829  mg/dL  Borderline High  >=562    mg/dL   High         11/22/863     TRIG  39  05/25/2010     HDL  63  05/25/2010     LDLCALC   Value: 63        Total Cholesterol/HDL:CHD Risk Coronary Heart Disease Risk Table                     Men   Women  1/2 Average Risk   3.4   3.3  Average Risk       5.0   4.4  2 X Average Risk   9.6   7.1  3 X Average Risk  23.4   11.0        Use the calculated Patient Ratio above and the CHD Risk Table to determine the patient's CHD Risk.        ATP III CLASSIFICATION (LDL):  <100     mg/dL  Optimal  784-696  mg/dL   Near or Above  Optimal  130-159  mg/dL   Borderline  161-096  mg/dL   High  >045     mg/dL   Very High  4/0/9811     ALT  23  03/31/2012     AST  49*  03/31/2012     NA  137  04/21/2012     K  4.2  04/21/2012     CL  108  04/21/2012     CREATININE  2.46*  04/21/2012     BUN  38*  04/21/2012     CO2  19  04/21/2012     TSH  2.297  02/21/2012     INR  1.16  01/04/2012     HGBA1C   Value: 5.4 (NOTE)                                                                       According to the ADA Clinical  Practice Recommendations for 2011, when HbA1c is used as a screening test:   >=6.5%   Diagnostic of Diabetes Mellitus           (if abnormal result  is confirmed)  5.7-6.4%   Increased risk of developing Diabetes Mellitus  References:Diagnosis and Classification of Diabetes Mellitus,Diabetes Care,2011,34(Suppl 1):S62-S69 and Standards of Medical Care in         Diabetes - 2011,Diabetes Care,2011,34  (Suppl 1):S11-S61.  05/28/2010     MICROALBUR  0.64  05/25/2010          Assessment / Plan:     Previous Version      Acute on chronic diastolic heart failure - Norma Fredrickson, NP  05/09/2012  3:43 PM  Signed Patient presents with worsening CHF symptoms. Class IV currently and failing on outpatient therapy. He is subsequently seen with Dr. Clifton James. He is in agreement that admission is needed. We will go ahead and update the echo. Diurese with IV Lasix. Check labs and CXR as well. Will try to tune him up. May need consideration for further intervention of his mitral valve. Patient is agreeable to this plan and will call if any problems develop in the interim.         Electronic signature on 05/09/2012          Vitals - Last Recorded       BP Pulse Ht Wt BMI      142/100 88 5\' 4"  (1.626 m) 154 lb 9.6 oz (70.126 kg) 26.54 kg/m2               Level of Service     LOS - NO CHARGE [NC1]      Follow-up and Disposition     Routing History Recorded        All Charges for This Encounter       Code Description Service Date Service Provider Modifiers Quantity    NC1 LOS - NO CHARGE 05/09/2012 Rosalio Macadamia, NP   1      Patient Instructions     We are going to admit you to the hospital           Previous Visit         Provider Department Encounter #    04/24/2012  4:45 PM  Norma Fredrickson, NP [facility] [id number]

## 2012-05-09 NOTE — Patient Instructions (Signed)
We are going to admit you to the hospital.  

## 2012-05-09 NOTE — Assessment & Plan Note (Signed)
Patient presents with worsening CHF symptoms. Class IV currently and failing on outpatient therapy. He is subsequently seen with Dr. Clifton James. He is in agreement that admission is needed. We will go ahead and update the echo. Diurese with IV Lasix. Check labs and CXR as well. Will try to tune him up. May need consideration for further intervention of his mitral valve. Patient is agreeable to this plan and will call if any problems develop in the interim.

## 2012-05-09 NOTE — H&P (Signed)
I have personally seen and examined this patient with Norma Fredrickson, NP. I agree with the assessment and plan as outlined above. He will be admitted for IV diuresis, echo, labs.   Kadeidra Coryell 5:34 PM 05/09/2012

## 2012-05-10 ENCOUNTER — Encounter (HOSPITAL_COMMUNITY): Payer: Self-pay | Admitting: *Deleted

## 2012-05-10 ENCOUNTER — Inpatient Hospital Stay (HOSPITAL_COMMUNITY): Payer: Medicaid Other

## 2012-05-10 DIAGNOSIS — I369 Nonrheumatic tricuspid valve disorder, unspecified: Secondary | ICD-10-CM

## 2012-05-10 DIAGNOSIS — N189 Chronic kidney disease, unspecified: Secondary | ICD-10-CM

## 2012-05-10 DIAGNOSIS — R109 Unspecified abdominal pain: Secondary | ICD-10-CM

## 2012-05-10 DIAGNOSIS — I5033 Acute on chronic diastolic (congestive) heart failure: Principal | ICD-10-CM

## 2012-05-10 DIAGNOSIS — N039 Chronic nephritic syndrome with unspecified morphologic changes: Secondary | ICD-10-CM

## 2012-05-10 LAB — BASIC METABOLIC PANEL
BUN: 47 mg/dL — ABNORMAL HIGH (ref 6–23)
CO2: 20 mEq/L (ref 19–32)
Calcium: 8.8 mg/dL (ref 8.4–10.5)
Chloride: 105 mEq/L (ref 96–112)
Creatinine, Ser: 3.01 mg/dL — ABNORMAL HIGH (ref 0.50–1.35)
GFR calc Af Amer: 26 mL/min — ABNORMAL LOW (ref >90)
GFR calc non Af Amer: 23 mL/min — ABNORMAL LOW (ref >90)
Glucose, Bld: 105 mg/dL — ABNORMAL HIGH (ref 70–99)
Potassium: 3.8 mEq/L (ref 3.5–5.1)
Sodium: 138 mEq/L (ref 135–145)

## 2012-05-10 LAB — CARDIAC PANEL(CRET KIN+CKTOT+MB+TROPI)
CK, MB: 3.3 ng/mL (ref 0.3–4.0)
CK, MB: 2.9 ng/mL (ref 0.3–4.0)
Relative Index: 2.5 (ref 0.0–2.5)
Relative Index: 2.8 — ABNORMAL HIGH (ref 0.0–2.5)
Total CK: 130 U/L (ref 7–232)
Total CK: 104 U/L (ref 7–232)
Troponin I: <0.3 ng/mL (ref <0.30)
Troponin I: <0.3 ng/mL (ref <0.30)

## 2012-05-10 LAB — TSH: TSH: 1.871 u[IU]/mL (ref 0.350–4.500)

## 2012-05-10 MED ORDER — HYDRALAZINE HCL 50 MG PO TABS
50.0000 mg | ORAL_TABLET | Freq: Three times a day (TID) | ORAL | Status: DC
  Administered 2012-05-10 – 2012-05-11 (×3): 50 mg via ORAL
  Filled 2012-05-10 (×6): qty 1

## 2012-05-10 NOTE — Progress Notes (Signed)
  Echocardiogram 2D Echocardiogram has been performed.  Georgian Co 05/10/2012, 4:18 PM

## 2012-05-10 NOTE — Progress Notes (Addendum)
Patient ID: Ryan Winters, male   DOB: [birth year], 51 y.o.   MRN: [id number]    Subjective:  Denies SSCP, palpitations Dyspnea improved Abdomen less distended Objective:  Filed Vitals:   05/09/12 1705 05/09/12 2157 05/10/12 0150 05/10/12 0628  BP: 151/103 143/96 124/83 140/97  Pulse: 98 99 82 82  Temp: 98.3 F (36.8 C) 98.3 F (36.8 C) 97.3 F (36.3 C) 97.9 F (36.6 C)  TempSrc: Oral Oral Oral Oral  Resp: 20 18 18 18   Height: 5\' 4"  (1.626 m)     Weight: 68.6 kg (151 lb 3.8 oz)   67.6 kg (149 lb 0.5 oz)  SpO2: 100% 99% 100% 100%    Intake/Output from previous day:  Intake/Output Summary (Last 24 hours) at 05/10/12 0801 Last data filed at 05/10/12 0757  Gross per 24 hour  Intake    750 ml  Output   2775 ml  Net  -2025 ml    Physical Exam: Affect appropriate Thin black male HEENT: normal Neck supple with no adenopathy JVP normal no bruits no thyromegaly Lungs clear with no wheezing and good diaphragmatic motion Heart:  S1/S2 loud MR  murmur, no rub, gallop or click PMI normal Abdomen: benighn, BS positve, no tenderness, no AAA Distended no bruit.  No HSM or HJR Distal pulses intact with no bruits No edema Neuro non-focal Skin warm and dry No muscular weakness Fistula in right wrist with good thrill   Lab Results: Basic Metabolic Panel:  Basename 05/10/12 0148 05/09/12 1752  NA 138 137  K 3.8 4.1  CL 105 105  CO2 20 20  GLUCOSE 105* 81  BUN 47* 45*  CREATININE 3.01* 2.92*  CALCIUM 8.8 9.0  MG -- 1.9  PHOS -- --   Liver Function Tests:  [facility] 05/09/12 1752  AST 49*  ALT 26  ALKPHOS 154*  BILITOT 1.8*  PROT 6.4  ALBUMIN 3.2*   No results found for this basename: LIPASE:2,AMYLASE:2 in the last 72 hours CBC:  Basename 05/09/12 1752  WBC 6.4  NEUTROABS 4.7  HGB 9.2*  HCT 28.4*  MCV 87.9  PLT 156   Cardiac Enzymes:  Basename 05/10/12 0148 05/09/12 1752  CKTOTAL 130 147  CKMB 3.3 3.7  CKMBINDEX -- --  TROPONINI <0.30 <0.30      Basename 05/09/12 1752  TSH 1.871  T4TOTAL --  T3FREE --  THYROIDAB --    Cardiac Studies:  ECG: 04/22/12  A paced rate 91 LVH   Telemetry: 05/10/2012  NSR no arrhythmia   Echo: Study Conclusions  12/30/11  - Left ventricle: The cavity size was normal. Wall thickness was normal. The estimated ejection fraction was 55%. - Mitral valve: S/P repair with moderate residual MR Valve area by pressure half-time: 2.2cm^2. Valve area by continuity equation (using LVOT flow): 1.06cm^2. - Left atrium: The atrium was moderately dilated. - Right ventricle: The cavity size was mildly dilated. - Right atrium: The atrium was mildly dilated. - Atrial septum: No defect or patent foramen ovale was identified. - Tricuspid valve: Severe regurgitation. - Pulmonary arteries: PA peak pressure: 47mm Hg (S).    Medications:     . ARIPiprazole  5 mg Oral Daily  . aspirin EC  81 mg Oral Daily  . enoxaparin  30 mg Subcutaneous Q24H  . furosemide  80 mg Intravenous Q12H  . hydrALAZINE  25 mg Oral Q8H  . lisinopril  20 mg Oral Daily  . metoCLOPramide  10 mg Oral TID AC & HS  .  metoprolol tartrate  50 mg Oral BID  . multivitamin with minerals  1 tablet Oral Daily  . pantoprazole  80 mg Oral Q1200  . sodium chloride  3 mL Intravenous Q12H       Assessment/Plan:  CHF:  Check BNP and echo pending.  Not volume overloaded on exam Clinical syndrome likely represents azotemia Continue lasix iv bid today Abdomen:  Distended check [place] and KUB R/O ileus or ascites mild elevation in LFT's HTN:  Increase hydralazine CRF:  Nephrology should see fistula mature no rub MR:  S/P repair with known moderate MR echo pending Anemia:  Should be on aranesp ? Dose  renal to see  [name] 05/10/2012, 8:01 AM

## 2012-05-11 LAB — BASIC METABOLIC PANEL
BUN: 52 mg/dL — ABNORMAL HIGH (ref 6–23)
CO2: 19 mEq/L (ref 19–32)
Calcium: 9 mg/dL (ref 8.4–10.5)
Chloride: 105 mEq/L (ref 96–112)
Creatinine, Ser: 3.24 mg/dL — ABNORMAL HIGH (ref 0.50–1.35)
GFR calc Af Amer: 24 mL/min — ABNORMAL LOW (ref >90)
GFR calc non Af Amer: 21 mL/min — ABNORMAL LOW (ref >90)
Glucose, Bld: 94 mg/dL (ref 70–99)
Potassium: 4.2 mEq/L (ref 3.5–5.1)
Sodium: 138 mEq/L (ref 135–145)

## 2012-05-11 MED ORDER — HYDRALAZINE HCL 25 MG PO TABS: 50.0000 mg | ORAL_TABLET | Freq: Three times a day (TID) | ORAL | Status: DC

## 2012-05-11 NOTE — Progress Notes (Signed)
Verbalized understanding .  Congestive Heart Failure education completed.

## 2012-05-11 NOTE — Progress Notes (Addendum)
Patient ID: Ryan Winters, male   DOB: Jul 26, 1961, 51 y.o.   MRN: [id number]    Subjective:  Denies SSCP, palpitations Dyspnea improved Abdomen less distended Ready to go home Objective:  Filed Vitals:   05/10/12 1841 05/10/12 2100 05/11/12 0209 05/11/12 0631  BP: 113/71 116/71 125/84 111/74  Pulse: 82 84 79 80  Temp: 97.6 F (36.4 C) 98.5 F (36.9 C) 98.1 F (36.7 C) 97.8 F (36.6 C)  TempSrc: Oral Oral Oral Oral  Resp: 22 20 20 18   Height:      Weight:    65.4 kg (144 lb 2.9 oz)  SpO2: 98% 100% 100% 100%    Intake/Output from previous day:  Intake/Output Summary (Last 24 hours) at 05/11/12 4098 Last data filed at 05/11/12 1191  Gross per 24 hour  Intake   1330 ml  Output   3975 ml  Net  -2645 ml    Physical Exam: Affect appropriate Thin black male HEENT: normal Neck supple with no adenopathy JVP normal no bruits no thyromegaly Lungs clear with no wheezing and good diaphragmatic motion Heart:  S1/S2 loud MR  murmur, no rub, gallop or click PMI normal Abdomen: benighn, BS positve, no tenderness, no AAA Distended no bruit.  No HSM or HJR Distal pulses intact with no bruits No edema Neuro non-focal Skin warm and dry No muscular weakness Fistula in right wrist with good thrill   Lab Results: Basic Metabolic Panel:  Basename 05/10/12 0148 05/09/12 1752  NA 138 137  K 3.8 4.1  CL 105 105  CO2 20 20  GLUCOSE 105* 81  BUN 47* 45*  CREATININE 3.01* 2.92*  CALCIUM 8.8 9.0  MG -- 1.9  PHOS -- --   Liver Function Tests:  [facility] 05/09/12 1752  AST 49*  ALT 26  ALKPHOS 154*  BILITOT 1.8*  PROT 6.4  ALBUMIN 3.2*   No results found for this basename: LIPASE:2,AMYLASE:2 in the last 72 hours CBC:  Basename 05/09/12 1752  WBC 6.4  NEUTROABS 4.7  HGB 9.2*  HCT 28.4*  MCV 87.9  PLT 156   Cardiac Enzymes:  Basename 05/10/12 1035 05/10/12 0148 05/09/12 1752  CKTOTAL 104 130 147  CKMB 2.9 3.3 3.7  CKMBINDEX -- -- --  TROPONINI <0.30 <0.30  <0.30     Basename 05/09/12 1752  TSH 1.871  T4TOTAL --  T3FREE --  THYROIDAB --    Cardiac Studies:  ECG: 04/22/12  A paced rate 91 LVH   Telemetry: 05/11/2012  NSR no arrhythmia   Echo: Study Conclusions  12/30/11  - Left ventricle: The cavity size was normal. Wall thickness was normal. The estimated ejection fraction was 55%. - Mitral valve: S/P repair with moderate residual MR Valve area by pressure half-time: 2.2cm^2. Valve area by continuity equation (using LVOT flow): 1.06cm^2. - Left atrium: The atrium was moderately dilated. - Right ventricle: The cavity size was mildly dilated. - Right atrium: The atrium was mildly dilated. - Atrial septum: No defect or patent foramen ovale was identified. - Tricuspid valve: Severe regurgitation. - Pulmonary arteries: PA peak pressure: 47mm Hg (S).  Echo from yesterday EF normal moderate MR S/P repair no change  Medications:      . ARIPiprazole  5 mg Oral Daily  . aspirin EC  81 mg Oral Daily  . enoxaparin  30 mg Subcutaneous Q24H  . furosemide  80 mg Intravenous Q12H  . hydrALAZINE  50 mg Oral Q8H  . lisinopril  20 mg Oral Daily  .  metoCLOPramide  10 mg Oral TID AC & HS  . metoprolol tartrate  50 mg Oral BID  . multivitamin with minerals  1 tablet Oral Daily  . pantoprazole  80 mg Oral Q1200  . sodium chloride  3 mL Intravenous Q12H  . DISCONTD: hydrALAZINE  25 mg Oral Q8H       Assessment/Plan:  CHF:  Good diuresis last two days Echo no change .  Not volume overloaded on exam Clinical syndrome likely represents azotemia D/C home  Lasix 80 bid Abdomen:  Cancel [place] KUB ok with good BM   HTN:  Increase hydralazine DC pon 50 q8 CRF:  Nephrology should see fistula mature no rub MR:  S/P repair with known moderate MR no change  Anemia:  Should be on aranesp ? Dose  renal to see  D/C home F/U renal to start aranasp for anemia.  F/U cards 4 weeks  [name] 05/11/2012, 7:18 AM

## 2012-05-11 NOTE — Discharge Summary (Signed)
Physician Discharge Summary  Patient ID: Ryan Winters MRN: [id number] DOB/AGE: 11-24-1960 51 y.o.  Admit date: 05/09/2012 Discharge date: 05/11/2012  Primary Cardiologist: Calton Dach, MD   Primary Discharge Diagnosis: 1 Acute/chronic DHF  - NYHA Class IV  - EF 55-60%; restrictive physiology  2 MR, moderate  3 CKD  Secondary Discharge Diagnoses: Past Medical History  Diagnosis Date  . Hypertension   . Bipolar disorder   . Schizophrenia     h/o hospitalized inpt therapy @ the [facility]  . 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 Hyperlipidemia   . H/O: suicide attempt 1998    was hospitalized at [facility]  . Anemia     Hx of iron infusions  . CHF (congestive heart failure)     Hx of Class IV CHF  . Gastroparesis   . Chest pain   . Abdominal pain   . Blood in stool   . N&V (nausea and vomiting)   . Generalized headaches     sinus  . Weakness   . Heart murmur   . Dysrhythmia     irreg heartbeat  . Shortness of breath     with exertion  x 2 years  . Chronic kidney disease     has fistula, not on HD  . Arthritis   . Depression     Reason for Admission: 51 yom, with known moderate MR, s/p prior MV repair in 2011, chronic DHF, and CKD, admitted directly from our office with c/o progressive DOE and dyspnea at rest.   Procedures: 2D Echocardiogram without contrast   Hospital Course: Patient admitted directly from office for aggressive management of CHF. Diuresed nearly 5 pounds on IV Lasix. Serial cardiac markers negative.  2D echo: EF 55-60%; restrictive physiology; mod MR, severe TR. No pericardial effusion. L pleural effusion.  Patient was cleared for D/C on hospital day #2. Dr Eden Emms suggested that the clinical syndrome likely represented azotemia. Recommended discharging on Lasix 80 bid, and increasing hydralazine to 50 q8. Also recommended to f/u with Renal, to start  aranasp for anemia.   Discharge Vitals: Blood pressure 111/74, pulse 80, temperature 97.8 F (36.6 C), temperature source Oral, resp. rate 18, height 5\' 4"  (1.626 m), weight 144 lb 2.9 oz (65.4 kg), SpO2 100.00%.  Labs: Lab Results  Component Value Date   WBC 6.4 05/09/2012   HGB 9.2* 05/09/2012   HCT 28.4* 05/09/2012   MCV 87.9 05/09/2012   PLT 156 05/09/2012      Lab 05/11/12 0656 05/09/12 1752  NA 138 --  K 4.2 --  CL 105 --  CO2 19 --  BUN 52* --  CREATININE 3.24* --  CALCIUM 9.0 --  ALBUMIN -- 3.2*  PROT -- 6.4  BILITOT -- 1.8*  ALKPHOS -- 154*  ALT -- 26  AST -- 49*  GLUCOSE 94 --    Lab Results  Component Value Date   CHOL  Value: 134        ATP III CLASSIFICATION:  <200     mg/dL   Desirable  102-725  mg/dL   Borderline High  >=366    mg/dL   High        02/20/346   HDL 63 05/25/2010   LDLCALC  Value: 63        Total Cholesterol/HDL:CHD Risk Coronary Heart Disease Risk Table  Men   Women  1/2 Average Risk   3.4   3.3  Average Risk       5.0   4.4  2 X Average Risk   9.6   7.1  3 X Average Risk  23.4   11.0        Use the calculated Patient Ratio above and the CHD Risk Table to determine the patient's CHD Risk.        ATP III CLASSIFICATION (LDL):  <100     mg/dL   Optimal  161-096  mg/dL   Near or Above                    Optimal  130-159  mg/dL   Borderline  045-409  mg/dL   High  >811     mg/dL   Very High 07/20/4781   TRIG 39 05/25/2010    No results found for this basename: DDIMER    Lab Results  Component Value Date   TSH 1.871 05/09/2012     Basename 05/10/12 1035 05/10/12 0148 05/09/12 1752  CKTOTAL 104 130 147  CKMB 2.9 3.3 3.7  TROPONINI <0.30 <0.30 <0.30    Diagnostic Studies: Dg Chest 2 View  05/10/2012  *RADIOLOGY REPORT*  Clinical Data: Shortness of breath.  Chest heaviness.  CHEST - 2 VIEW  Comparison: 04/21/2012  Findings: Postoperative changes consistent with a median sternotomy and mitral valve surgery.  The lungs are clear  bilaterally.  No evidence for airspace disease or edema.  Heart size is mildly enlarged but unchanged.  Trachea is midline.  Negative for pleural effusions.  IMPRESSION: Stable postoperative changes in the chest.  No acute chest findings.  Original Report Authenticated By: Richarda Overlie, M.D.   Dg Chest 2 View  04/21/2012  *RADIOLOGY REPORT*  Clinical Data: Chest pain and shortness of breath.  CHEST - 2 VIEW  Comparison: 02/20/2012  Findings: The cardiopericardial silhouette is enlarged.  No pneumonia or pulmonary edema.  No pleural effusion.  The patient is status post mitral valve replacement. Telemetry leads overlie the chest.  IMPRESSION: Stable.  Cardiomegaly without acute findings.  Original Report Authenticated By: ERIC A. MANSELL, M.D.   Dg Abd 1 View  05/10/2012  *RADIOLOGY REPORT*  Clinical Data: Abdominal distention.  ABDOMEN - 1 VIEW  Comparison: Chest radiograph 05/10/2012 and abdominal series 02/21/2012  Findings: Supine view of the abdomen demonstrates gas and stool within the colon.  There is no evidence for small bowel gaseous distention.  Stable phlebolith in the left hemi pelvis.  No large calcifications overlying the renal regions.  Small amount of gas in the stomach region.  IMPRESSION: Nonobstructive bowel gas pattern. Moderate amount of stool in the abdomen.  Original Report Authenticated By: Richarda Overlie, M.D.     DISPOSITION: Stable condition  FOLLOW UP PLANS AND APPOINTMENTS: Discharge Orders    Future Orders Please Complete By Expires   Diet - low sodium heart healthy      Increase activity slowly        Follow-up Information    Follow up with COLADONATO,JOSEPH A, MD. Schedule an appointment as soon as possible for a visit in 2 weeks.   Contact information:   [address] [employer] [place] [address] [phone]       Follow up with Norma Fredrickson, NP in 4 weeks. (office will call and arrange)    Contact information:   [address]  [place] [address] [phone]          DISCHARGE MEDICATIONS: Medication List  As of 05/11/2012  8:17 AM   TAKE these medications         ABILIFY 5 MG tablet   Generic drug: ARIPiprazole   Take 5 mg by mouth daily.      albuterol 108 (90 BASE) MCG/ACT inhaler   Commonly known as: PROVENTIL HFA;VENTOLIN HFA   Inhale 2 puffs into the lungs every 4 (four) hours as needed for wheezing.      aspirin 81 MG EC tablet   Take 81 mg by mouth daily.      furosemide 80 MG tablet   Commonly known as: LASIX   Take 80 mg by mouth 2 (two) times daily.      hydrALAZINE 25 MG tablet   Commonly known as: APRESOLINE   Take 2 tablets (50 mg total) by mouth 3 (three) times daily.      hydrOXYzine 50 MG capsule   Commonly known as: VISTARIL   Take 50 mg by mouth at bedtime.      lisinopril 20 MG tablet   Commonly known as: PRINIVIL,ZESTRIL   Take 20 mg by mouth daily.      metoCLOPramide 10 MG tablet   Commonly known as: REGLAN   Take 1 tablet (10 mg total) by mouth 4 (four) times daily -  before meals and at bedtime.      metoprolol 50 MG tablet   Commonly known as: LOPRESSOR   Take 50 mg by mouth 2 (two) times daily.      multivitamins ther. w/minerals Tabs   Take 1 tablet by mouth daily.      omeprazole 40 MG capsule   Commonly known as: PRILOSEC   Take 1 capsule (40 mg total) by mouth 2 (two) times daily.      promethazine 25 MG tablet   Commonly known as: PHENERGAN   Take 25 mg by mouth every 6 (six) hours as needed. Nausea      STOOL SOFTENER PO   Take by mouth as needed. OTC              BRING ALL MEDICATIONS WITH YOU TO FOLLOW UP APPOINTMENTS  Time spent with patient to include physician time: Greater than 30 minutes, including physician time.  SignedPrescott Parma 05/11/2012, 8:17 AM Co-Sign MD

## 2012-05-11 NOTE — Discharge Instructions (Signed)
Monitor weights daily, and call if weight increases 3 lb/24 hrs Refrain from added salt in diet

## 2012-05-12 LAB — DRUGS OF ABUSE SCREEN W/O ALC, ROUTINE URINE
Amphetamine Screen, Ur: NEGATIVE
Barbiturate Quant, Ur: NEGATIVE
Benzodiazepines.: NEGATIVE
Cocaine Metabolites: POSITIVE — AB
Creatinine,U: 20.6 mg/dL
Marijuana Metabolite: NEGATIVE
Methadone: NEGATIVE
Opiate Screen, Urine: NEGATIVE
Phencyclidine (PCP): NEGATIVE
Propoxyphene: NEGATIVE
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
## 2012-05-12 NOTE — Addendum Note (Signed)
Addended by: Reine Just on: 05/12/2012 12:34 PM   Modules accepted: Orders

## 2012-05-13 ENCOUNTER — Telehealth: Payer: Self-pay | Admitting: Cardiovascular Disease

## 2012-05-13 NOTE — Telephone Encounter (Signed)
I left a message for the pt on his voicemail to make him aware that a letter has been completed and placed at the front desk for pick-up.

## 2012-05-13 NOTE — Telephone Encounter (Signed)
New Problem:    Patient called needing a note form the doctor explaining why he missed work on Friday for his child support court date tomorrow.  Please call back.

## 2012-05-14 ENCOUNTER — Telehealth: Payer: Self-pay | Admitting: Cardiovascular Disease

## 2012-05-14 NOTE — Telephone Encounter (Signed)
New Problem:    Patient called in because his lawyer said that the note her received from you is insufficient and that he needs another note stating that he will need to be out of work until he sees Lawson Fiscal again on 05/28/12.  Please call back.

## 2012-05-14 NOTE — Telephone Encounter (Signed)
F/u  Patient calling for status on f/u, patient has to have documentation to the court by 2pm today  05/14/12. Patient can be reached at [phone] but he will be in court and will not be able to answer.  Patient states letter does not state he was not able to work and the date he returned to work.

## 2012-05-14 NOTE — Telephone Encounter (Signed)
I spoke with the pt and have been unable to speak with him earlier due to him being in court.  The pt needs documentation from our office about his heart condition and that he is unable to work. The pt's child support hearing is now rescheduled to 06/25/12. The pt said he would like to get this letter at his appointment on 05/28/12.  I made the pt aware that Dr Excell Seltzer is also in the office that same day and we should be able to do letter at that time.

## 2012-05-15 NOTE — Progress Notes (Signed)
Utilization review completed.  

## 2012-05-28 ENCOUNTER — Ambulatory Visit: Payer: Self-pay | Admitting: Nurse Practitioner

## 2012-06-03 ENCOUNTER — Emergency Department (HOSPITAL_COMMUNITY): Payer: Medicaid Other

## 2012-06-03 ENCOUNTER — Emergency Department (HOSPITAL_COMMUNITY)
Admission: EM | Admit: 2012-06-03 | Discharge: 2012-06-04 | Disposition: A | Discharge status: Home / Self Care | Payer: Medicaid Other | Attending: Emergency Medicine | Admitting: Emergency Medicine

## 2012-06-03 ENCOUNTER — Encounter (HOSPITAL_COMMUNITY): Payer: Self-pay | Admitting: *Deleted

## 2012-06-03 DIAGNOSIS — F319 Bipolar disorder, unspecified: Secondary | ICD-10-CM | POA: Insufficient documentation

## 2012-06-03 DIAGNOSIS — I059 Rheumatic mitral valve disease, unspecified: Secondary | ICD-10-CM | POA: Insufficient documentation

## 2012-06-03 DIAGNOSIS — I129 Hypertensive chronic kidney disease with stage 1 through stage 4 chronic kidney disease, or unspecified chronic kidney disease: Secondary | ICD-10-CM | POA: Insufficient documentation

## 2012-06-03 DIAGNOSIS — F209 Schizophrenia, unspecified: Secondary | ICD-10-CM | POA: Insufficient documentation

## 2012-06-03 DIAGNOSIS — N189 Chronic kidney disease, unspecified: Secondary | ICD-10-CM | POA: Insufficient documentation

## 2012-06-03 DIAGNOSIS — K3184 Gastroparesis: Secondary | ICD-10-CM

## 2012-06-03 DIAGNOSIS — Z87891 Personal history of nicotine dependence: Secondary | ICD-10-CM | POA: Insufficient documentation

## 2012-06-03 DIAGNOSIS — D649 Anemia, unspecified: Secondary | ICD-10-CM | POA: Insufficient documentation

## 2012-06-03 DIAGNOSIS — I509 Heart failure, unspecified: Secondary | ICD-10-CM | POA: Insufficient documentation

## 2012-06-03 DIAGNOSIS — R109 Unspecified abdominal pain: Secondary | ICD-10-CM | POA: Insufficient documentation

## 2012-06-03 NOTE — ED Notes (Signed)
Pt states has aching abd pain as well as tightness in his chest and numbness in left arm, and right side of his head.

## 2012-06-03 NOTE — ED Notes (Addendum)
Per pt:  Pt complains of numbness down his left arm and numbness on the R side of his head.  Also complains of abdominal pain, rates pain 10/10, also complains of nausea st's he took phenergan at home but it didn't work.  Pt was laying in bed when pain started.  Pt also complains of shortness of breath.  Pt complains of lightheadedness and some dizziness.  St's the chest pain is sharp in nature, pt has had a mitral valve repair.  Pt has fistula in L arm but isn't on dialysis.  Pt st's laying flat makes the pain worse.  Pain originally started this afternoon but started to get bad a couple of hours ago.

## 2012-06-03 NOTE — ED Notes (Signed)
Pt is hypertensive but st's he didn't take his BP meds tonight.

## 2012-06-04 ENCOUNTER — Ambulatory Visit (INDEPENDENT_AMBULATORY_CARE_PROVIDER_SITE_OTHER): Payer: Self-pay | Admitting: Nurse Practitioner

## 2012-06-04 ENCOUNTER — Emergency Department (HOSPITAL_COMMUNITY): Payer: Medicaid Other

## 2012-06-04 ENCOUNTER — Encounter: Payer: Self-pay | Admitting: Nurse Practitioner

## 2012-06-04 ENCOUNTER — Telehealth: Payer: Self-pay | Admitting: Cardiovascular Disease

## 2012-06-04 VITALS — BP 170/120 | HR 98 | Ht 66.0 in | Wt 146.4 lb

## 2012-06-04 DIAGNOSIS — N184 Chronic kidney disease, stage 4 (severe): Secondary | ICD-10-CM

## 2012-06-04 DIAGNOSIS — I340 Nonrheumatic mitral (valve) insufficiency: Secondary | ICD-10-CM

## 2012-06-04 DIAGNOSIS — Z9189 Other specified personal risk factors, not elsewhere classified: Secondary | ICD-10-CM

## 2012-06-04 DIAGNOSIS — I10 Essential (primary) hypertension: Secondary | ICD-10-CM

## 2012-06-04 DIAGNOSIS — I059 Rheumatic mitral valve disease, unspecified: Secondary | ICD-10-CM

## 2012-06-04 LAB — BASIC METABOLIC PANEL
BUN: 54 mg/dL — ABNORMAL HIGH (ref 6–23)
CO2: 21 mEq/L (ref 19–32)
Calcium: 9 mg/dL (ref 8.4–10.5)
Chloride: 108 mEq/L (ref 96–112)
Creatinine, Ser: 3.2 mg/dL — ABNORMAL HIGH (ref 0.4–1.5)
GFR: 26.76 mL/min — ABNORMAL LOW (ref >60.00)
Glucose, Bld: 98 mg/dL (ref 70–99)
Potassium: 4.1 mEq/L (ref 3.5–5.1)
Sodium: 138 mEq/L (ref 135–145)

## 2012-06-04 LAB — CBC WITH DIFFERENTIAL/PLATELET
Eosinophils Relative: 18 % — ABNORMAL HIGH (ref 0–5)
HCT: 28.3 % — ABNORMAL LOW (ref 39.0–52.0)
Hemoglobin: 9.2 g/dL — ABNORMAL LOW (ref 13.0–17.0)
Lymphs Abs: 1 10*3/uL (ref 0.7–4.0)
MCV: 87.3 fL (ref 78.0–100.0)
Monocytes Relative: 9 % (ref 3–12)
Neutro Abs: 5.2 10*3/uL (ref 1.7–7.7)
Platelets: 146 10*3/uL — ABNORMAL LOW (ref 150–400)
RBC: 3.24 MIL/uL — ABNORMAL LOW (ref 4.22–5.81)
WBC: 8.7 10*3/uL (ref 4.0–10.5)

## 2012-06-04 LAB — COMPREHENSIVE METABOLIC PANEL
AST: 59 U/L — ABNORMAL HIGH (ref 0–37)
CO2: 19 mEq/L (ref 19–32)
Chloride: 104 mEq/L (ref 96–112)
Creatinine, Ser: 3.11 mg/dL — ABNORMAL HIGH (ref 0.50–1.35)
GFR calc non Af Amer: 22 mL/min — ABNORMAL LOW (ref >90)
Total Bilirubin: 2.1 mg/dL — ABNORMAL HIGH (ref 0.3–1.2)

## 2012-06-04 LAB — LIPASE, BLOOD: Lipase: 63 U/L — ABNORMAL HIGH (ref 11–59)

## 2012-06-04 MED ORDER — SODIUM CHLORIDE 0.9 % IV SOLN
Freq: Once | INTRAVENOUS | Status: AC
  Administered 2012-06-04: 1000 mL via INTRAVENOUS

## 2012-06-04 MED ORDER — PROMETHAZINE HCL 25 MG/ML IJ SOLN
12.5000 mg | Freq: Once | INTRAMUSCULAR | Status: AC
  Administered 2012-06-04: 12.5 mg via INTRAVENOUS
  Filled 2012-06-04: qty 1

## 2012-06-04 MED ORDER — AMLODIPINE BESYLATE 5 MG PO TABS: 5.0000 mg | ORAL_TABLET | Freq: Every day | ORAL | Status: DC

## 2012-06-04 MED ORDER — SODIUM CHLORIDE 0.9 % IV SOLN
INTRAVENOUS | Status: DC
  Administered 2012-06-04: 02:00:00 via INTRAVENOUS

## 2012-06-04 MED ORDER — HYDROMORPHONE HCL PF 1 MG/ML IJ SOLN
1.0000 mg | Freq: Once | INTRAMUSCULAR | Status: AC
  Administered 2012-06-04: 1 mg via INTRAVENOUS
  Filled 2012-06-04: qty 1

## 2012-06-04 MED ORDER — OXYCODONE-ACETAMINOPHEN 5-325 MG PO TABS: 1.0000 | ORAL_TABLET | Freq: Four times a day (QID) | ORAL | Status: AC | PRN

## 2012-06-04 NOTE — ED Provider Notes (Signed)
History     CSN: [id number]  Arrival date & time 06/03/12  2322   None     Chief Complaint  Patient presents with  . Abdominal Pain    (Consider location/radiation/quality/duration/timing/severity/associated sxs/prior treatment) HPI This is a 51 year old black male who developed epigastric pain yesterday. The pain is moderate to severe and characterized as like previous episodes of gastroparesis. He has been nauseated and had a single episode of emesis. The nausea was partially relieved by taking Phenergan. He states he is having some tightness in his chest and shortness of breath but these are not severe. He has some numbness in his left arm that was transient and is not present now. His pain is improved in a sitting upright position, worse lying supine. Pain is also worse with palpation.  Past Medical History  Diagnosis Date  . Hypertension   . Bipolar disorder   . Schizophrenia     h/o hospitalized inpt therapy @ the [facility]  . 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 Hyperlipidemia   . H/O: suicide attempt 1998    was hospitalized at [facility]  . Anemia     Hx of iron infusions  . CHF (congestive heart failure)     Hx of Class IV CHF  . Gastroparesis   . Chest pain   . Abdominal pain   . Blood in stool   . N&V (nausea and vomiting)   . Generalized headaches     sinus  . Weakness   . Heart murmur   . Dysrhythmia     irreg heartbeat  . Shortness of breath     with exertion  x 2 years  . Chronic kidney disease     has fistula, not on HD  . Arthritis   . Depression     Past Surgical History  Procedure Date  . Mitral valve repair 05/2010  . Av fistula placement, radiocephalic 05/24/2011    Left arm  . Leg surgery   . Umbilical hernia repair 04/01/2012    Procedure: HERNIA REPAIR UMBILICAL ADULT;  Surgeon: Ardeth Sportsman, MD;  Location: [facility] OR;  Service: General;   Laterality: N/A;  supra umbilical    Family History  Problem Relation Age of Onset  . Hypertension Mother     History  Substance Use Topics  . Smoking status: Former Smoker -- 30 years    Types: Cigarettes    Quit date: 03/21/2011  . Smokeless tobacco: Never Used  . Alcohol Use: No     Quit alcohol in July of 2011      Review of Systems  All other systems reviewed and are negative.    Allergies  Cashew nut oil and Food  Home Medications   Current Outpatient Rx  Name Route Sig Dispense Refill  . ALBUTEROL SULFATE HFA 108 (90 BASE) MCG/ACT IN AERS Inhalation Inhale 2 puffs into the lungs every 4 (four) hours as needed.    . ARIPIPRAZOLE 5 MG PO TABS Oral Take 5 mg by mouth daily.      . ASPIRIN 81 MG PO TBEC Oral Take 81 mg by mouth daily.      Marland Kitchen DOCUSATE SODIUM 100 MG PO CAPS Oral Take 100 mg by mouth 2 (two) times daily.    . FUROSEMIDE 80 MG PO TABS Oral Take 80 mg by mouth 2 (two) times daily.    Marland Kitchen  HYDRALAZINE HCL 25 MG PO TABS Oral Take 50 mg by mouth 3 (three) times daily.    Marland Kitchen HYDROXYZINE PAMOATE 50 MG PO CAPS Oral Take 50 mg by mouth at bedtime.     Marland Kitchen LISINOPRIL 20 MG PO TABS Oral Take 20 mg by mouth daily.    Marland Kitchen METOCLOPRAMIDE HCL 10 MG PO TABS Oral Take 10 mg by mouth 4 (four) times daily -  before meals and at bedtime.    Marland Kitchen METOPROLOL TARTRATE 50 MG PO TABS Oral Take 50 mg by mouth 2 (two) times daily.    Carma Leaven M PLUS PO TABS Oral Take 1 tablet by mouth daily.    Marland Kitchen OMEPRAZOLE 40 MG PO CPDR Oral Take 40 mg by mouth 2 (two) times daily.    Marland Kitchen PROMETHAZINE HCL 25 MG PO TABS Oral Take 25 mg by mouth every 6 (six) hours as needed. Nausea       BP 169/121  Pulse 102  Temp 98.1 F (36.7 C) (Oral)  Resp 25  SpO2 100%  Physical Exam General: Well-developed, well-nourished male in no acute distress; appearance consistent with age of record HENT: normocephalic, atraumatic Eyes: pupils equal round and reactive to light; extraocular muscles intact Neck:  supple Heart: regular rate and rhythm; 306 systolic murmur heard at right upper sternal border Lungs: clear to auscultation bilaterally Abdomen: soft; mildly distended; epigastric; bowel sounds present Extremities: No deformity; full range of motion; dialysis fistula left forearm with pulse and thrill Neurologic: Awake, alert and oriented; motor function intact in all extremities and symmetric; no facial droop Skin: Warm and dry Psychiatric: Flat    ED Course  Procedures (including critical care time)     MDM   Nursing notes and vitals signs, including pulse oximetry, reviewed.  Summary of this visit's results, reviewed by myself:  Labs:  Results for orders placed during the hospital encounter of 06/03/12  CBC WITH DIFFERENTIAL      Component Value Range   WBC 8.7  4.0 - 10.5 K/uL   RBC 3.24 (*) 4.22 - 5.81 MIL/uL   Hemoglobin 9.2 (*) 13.0 - 17.0 g/dL   HCT 16.1 (*) 09.6 - 04.5 %   MCV 87.3  78.0 - 100.0 fL   MCH 28.4  26.0 - 34.0 pg   MCHC 32.5  30.0 - 36.0 g/dL   RDW 40.9 (*) 81.1 - 91.4 %   Platelets 146 (*) 150 - 400 K/uL   Neutrophils Relative 61  43 - 77 %   Lymphocytes Relative 11 (*) 12 - 46 %   Monocytes Relative 9  3 - 12 %   Eosinophils Relative 18 (*) 0 - 5 %   Basophils Relative 1  0 - 1 %   Neutro Abs 5.2  1.7 - 7.7 K/uL   Lymphs Abs 1.0  0.7 - 4.0 K/uL   Monocytes Absolute 0.8  0.1 - 1.0 K/uL   Eosinophils Absolute 1.6 (*) 0.0 - 0.7 K/uL   Basophils Absolute 0.1  0.0 - 0.1 K/uL   RBC Morphology POLYCHROMASIA PRESENT    COMPREHENSIVE METABOLIC PANEL      Component Value Range   Sodium 135  135 - 145 mEq/L   Potassium 3.9  3.5 - 5.1 mEq/L   Chloride 104  96 - 112 mEq/L   CO2 19  19 - 32 mEq/L   Glucose, Bld 89  70 - 99 mg/dL   BUN 50 (*) 6 - 23 mg/dL   Creatinine, Ser 7.82 (*)  0.50 - 1.35 mg/dL   Calcium 8.8  8.4 - 91.4 mg/dL   Total Protein 6.7  6.0 - 8.3 g/dL   Albumin 3.3 (*) 3.5 - 5.2 g/dL   AST 59 (*) 0 - 37 U/L   ALT 26  0 - 53 U/L    Alkaline Phosphatase 148 (*) 39 - 117 U/L   Total Bilirubin 2.1 (*) 0.3 - 1.2 mg/dL   GFR calc non Af Amer 22 (*) >90 mL/min   GFR calc Af Amer 25 (*) >90 mL/min  POCT I-STAT TROPONIN I      Component Value Range   Troponin i, poc 0.03  0.00 - 0.08 ng/mL   Comment 3           LIPASE, BLOOD      Component Value Range   Lipase 63 (*) 11 - 59 U/L    Imaging Studies: Dg Chest 2 View  06/04/2012  *RADIOLOGY REPORT*  Clinical Data: Chest pain and shortness of breath  CHEST - 2 VIEW  Comparison: 05/10/2012  Findings: Evidence of median sternotomy and mitral valvuloplasty. Heart size mildly enlarged without edema.  The lungs are clear.  No pleural effusion.  No acute osseous finding.  IMPRESSION: No acute cardiopulmonary process.  Original Report Authenticated By: Harrel Lemon, M.D.   Dg Abd 2 Views  06/04/2012  *RADIOLOGY REPORT*  Clinical Data: Upper abdominal pain and nausea  ABDOMEN - 2 VIEW  Comparison: 05/10/2012  Findings: Mitral valvuloplasty and median sternotomy wires noted. No free air beneath the diaphragms.  No air fluid levels.  No gas filled distended loop of bowel.  Normal visualized caliber of colon.  No acute osseous finding.  IMPRESSION: Normal bowel gas pattern.  Original Report Authenticated By: Harrel Lemon, M.D.    EKG Interpretation:  Date & Time: 06/04/2012 11:31 PM  Rate: 97  Rhythm: normal sinus rhythm  QRS Axis: normal  Intervals: normal  ST/T Wave abnormalities: normal  Conduction Disutrbances:LVH with secondary repolarization abnormality  Narrative Interpretation:   Old EKG Reviewed: Electronically paced beats seen previously  3:11 AM Feels better. Has been eating and drinking without emesis.         Hanley Seamen, MD 06/04/12 [phone]

## 2012-06-04 NOTE — Assessment & Plan Note (Signed)
Has moderate MR. EF is normal. May at some point require repeat surgical intervention. Cessation from his drug use is highly encouraged to help his overall situation.

## 2012-06-04 NOTE — Assessment & Plan Note (Signed)
Rechecking BMET today. Sees Renal in 4 weeks. May need to consider Aranesp.

## 2012-06-04 NOTE — Telephone Encounter (Signed)
Thanks for your help.

## 2012-06-04 NOTE — Assessment & Plan Note (Signed)
We have had a frank conversation regarding his drug use. If he is not willing to change his habits, I have explained that his options are quite limited and that his life expectancy is going to be much shorter. He is asking about a treatment center to go to. Will refer to [facility].

## 2012-06-04 NOTE — Progress Notes (Signed)
Ryan Winters Date of Birth: [birth year] Medical Record #[id number]  History of Present Illness: Ryan Winters is seen today for a post hospital visit. He is seen for Dr. Excell Seltzer. He is a 51 year old black male with multiple medical issues which are outlined below. These include moderate MR despite prior MV repair in 2011, CHF and CKD. He has gastroparesis. I saw him last month and admitted him with heart failure. Drug screen was positive for cocaine. His echo was unchanged. He was advised to follow up with renal. Possibly getting closer to dialysis.   He comes in today. He is here with his wife. He is having stomach issues today. Not too short of breath today. Says he is taking his medicines. I asked about his drug use. Says he has not used any in 4 days. Never had actual withdrawal that he is aware of. BP remains high. Sees renal next month. He is also anemic. Not clear as to whether he is on Aranesp. Has received IV iron in the past.   Current Outpatient Prescriptions on File Prior to Visit  Medication Sig Dispense Refill  . albuterol (PROVENTIL HFA;VENTOLIN HFA) 108 (90 BASE) MCG/ACT inhaler Inhale 2 puffs into the lungs every 4 (four) hours as needed.      . ARIPiprazole (ABILIFY) 5 MG tablet Take 5 mg by mouth daily.        Marland Kitchen aspirin 81 MG EC tablet Take 81 mg by mouth daily.        Marland Kitchen docusate sodium (COLACE) 100 MG capsule Take 100 mg by mouth 2 (two) times daily.      . furosemide (LASIX) 80 MG tablet Take 80 mg by mouth 2 (two) times daily.      . hydrALAZINE (APRESOLINE) 25 MG tablet Take 50 mg by mouth 3 (three) times daily.      . hydrOXYzine (VISTARIL) 50 MG capsule Take 50 mg by mouth at bedtime.       Marland Kitchen lisinopril (PRINIVIL,ZESTRIL) 20 MG tablet Take 20 mg by mouth daily.      . metoCLOPramide (REGLAN) 10 MG tablet Take 10 mg by mouth 4 (four) times daily -  before meals and at bedtime.      . metoprolol (LOPRESSOR) 50 MG tablet Take 50 mg by mouth 2 (two) times daily.      .  Multiple Vitamins-Minerals (MULTIVITAMINS THER. W/MINERALS) TABS Take 1 tablet by mouth daily.      Marland Kitchen omeprazole (PRILOSEC) 40 MG capsule Take 40 mg by mouth 2 (two) times daily.      Marland Kitchen oxyCODONE-acetaminophen (PERCOCET/ROXICET) 5-325 MG per tablet Take 1-2 tablets by mouth every 6 (six) hours as needed for pain.  30 tablet  0  . promethazine (PHENERGAN) 25 MG tablet Take 25 mg by mouth every 6 (six) hours as needed. Nausea       . DISCONTD: albuterol (PROVENTIL HFA;VENTOLIN HFA) 108 (90 BASE) MCG/ACT inhaler Inhale 2 puffs into the lungs every 4 (four) hours as needed for wheezing.  1 Inhaler  0  . DISCONTD: metoCLOPramide (REGLAN) 10 MG tablet Take 1 tablet (10 mg total) by mouth 4 (four) times daily -  before meals and at bedtime.  60 tablet  5  . DISCONTD: omeprazole (PRILOSEC) 40 MG capsule Take 1 capsule (40 mg total) by mouth 2 (two) times daily.  60 capsule  5   Current Facility-Administered Medications on File Prior to Visit  Medication Dose Route Frequency Provider Last Rate Last Dose  .  0.9 %  sodium chloride infusion   Intravenous Once Carlisle Beers Molpus, MD   1,000 mL at 06/04/12 0052  . HYDROmorphone (DILAUDID) injection 1 mg  1 mg Intravenous Once Carlisle Beers Molpus, MD   1 mg at 06/04/12 0052  . promethazine (PHENERGAN) injection 12.5 mg  12.5 mg Intravenous Once Carlisle Beers Molpus, MD   12.5 mg at 06/04/12 0052  . DISCONTD: 0.9 %  sodium chloride infusion   Intravenous Continuous Carlisle Beers Molpus, MD        Allergies  Allergen Reactions  . Cashew Nut Oil Anaphylaxis  . Food Anaphylaxis    Strawberries, Cashews, causes throat to swell - affects breathing    Past Medical History  Diagnosis Date  . Hypertension   . Bipolar disorder   . Schizophrenia     h/o hospitalized inpt therapy @ the [facility]  . 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 Hyperlipidemia   . H/O: suicide attempt 1998    was hospitalized  at [facility]  . Anemia     Hx of iron infusions  . CHF (congestive heart failure)     Hx of Class IV CHF  . Gastroparesis   . Chest pain   . Abdominal pain   . Blood in stool   . N&V (nausea and vomiting)   . Generalized headaches     sinus  . Weakness   . Heart murmur   . Dysrhythmia     irreg heartbeat  . Shortness of breath     with exertion  x 2 years  . Chronic kidney disease     has fistula, not on HD  . Arthritis   . Depression   . Cocaine abuse     Past Surgical History  Procedure Date  . Mitral valve repair 05/2010  . Av fistula placement, radiocephalic 05/24/2011    Left arm  . Leg surgery   . Umbilical hernia repair 04/01/2012    Procedure: HERNIA REPAIR UMBILICAL ADULT;  Surgeon: Ardeth Sportsman, MD;  Location: [facility] OR;  Service: General;  Laterality: N/A;  supra umbilical    History  Smoking status  . Former Smoker -- 30 years  . Types: Cigarettes  . Quit date: 03/21/2011  Smokeless tobacco  . Never Used    History  Alcohol Use No    Quit alcohol in July of 2011    Family History  Problem Relation Age of Onset  . Hypertension Mother     Review of Systems: The review of systems is per the HPI.  All other systems were reviewed and are negative.  Physical Exam: BP 170/120  Pulse 98  Ht 5\' 6"  (1.676 m)  Wt 146 lb 6.4 oz (66.407 kg)  BMI 23.63 kg/m2 Patient is alert and in no acute distress. He is quite thin. Skin is warm and dry. Color is normal.  HEENT is unremarkable. Normocephalic/atraumatic. PERRL. Sclera are nonicteric. Neck is supple. No masses. No JVD. Lungs are clear. Cardiac exam shows a regular rate and rhythm. He has an S3. He has a loud systolic murmur noted. Abdomen is soft. Extremities are without edema. Gait and ROM are intact. No gross neurologic deficits noted.   LABORATORY DATA: BMET is pending for today.   Lab Results  Component Value Date   WBC 8.7 06/03/2012   HGB 9.2* 06/03/2012   HCT 28.3* 06/03/2012  PLT 146*  06/03/2012   GLUCOSE 89 06/03/2012   CHOL  Value: 134        ATP III CLASSIFICATION:  <200     mg/dL   Desirable  409-811  mg/dL   Borderline High  >=914    mg/dL   High        05/27/2955   TRIG 39 05/25/2010   HDL 63 05/25/2010   LDLCALC  Value: 63        Total Cholesterol/HDL:CHD Risk Coronary Heart Disease Risk Table                     Men   Women  1/2 Average Risk   3.4   3.3  Average Risk       5.0   4.4  2 X Average Risk   9.6   7.1  3 X Average Risk  23.4   11.0        Use the calculated Patient Ratio above and the CHD Risk Table to determine the patient's CHD Risk.        ATP III CLASSIFICATION (LDL):  <100     mg/dL   Optimal  213-086  mg/dL   Near or Above                    Optimal  130-159  mg/dL   Borderline  578-469  mg/dL   High  >629     mg/dL   Very High 03/20/8412   ALT 26 06/03/2012   AST 59* 06/03/2012   NA 135 06/03/2012   K 3.9 06/03/2012   CL 104 06/03/2012   CREATININE 3.11* 06/03/2012   BUN 50* 06/03/2012   CO2 19 06/03/2012   TSH 1.871 05/09/2012   INR 1.21 05/09/2012   HGBA1C  Value: 5.4 (NOTE)                                                                       According to the ADA Clinical Practice Recommendations for 2011, when HbA1c is used as a screening test:   >=6.5%   Diagnostic of Diabetes Mellitus           (if abnormal result  is confirmed)  5.7-6.4%   Increased risk of developing Diabetes Mellitus  References:Diagnosis and Classification of Diabetes Mellitus,Diabetes Care,2011,34(Suppl 1):S62-S69 and Standards of Medical Care in         Diabetes - 2011,Diabetes Care,2011,34  (Suppl 1):S11-S61. 05/28/2010   MICROALBUR 0.64 05/25/2010    Assessment / Plan:

## 2012-06-04 NOTE — Telephone Encounter (Signed)
I called [facility] and spoke with Natalia Leatherwood, she said without insurance coverage out pt intensive therapy which is what is recommended for his addiction would have a starting cost of 500 a  day. They gave me two numbers to local mental health for out pt therapy, i called pt and numbers were given. Pt said he would call.

## 2012-06-04 NOTE — Assessment & Plan Note (Signed)
Blood pressure remains up. He says he is taking his medicines. I was not totally convinced but am adding Norvasc 5 mg daily. Will see Dr. Excell Seltzer in one month.

## 2012-06-04 NOTE — Patient Instructions (Addendum)
We are going to check your kidney function today  We will get an appointment with Dr. Excell Seltzer for one month from now  I am adding Norvasc 5 mg for your blood pressure - take one a day  We will call Behavior Health at [facility] to see what treatment programs are available for you to go to  Call the [facility] office at [phone] if you have any questions, problems or concerns.

## 2012-06-04 NOTE — Telephone Encounter (Signed)
Pt calling re a referral for behavioral health, and has not heard from anyone

## 2012-06-13 ENCOUNTER — Telehealth: Payer: Self-pay | Admitting: *Deleted

## 2012-06-13 NOTE — Telephone Encounter (Signed)
Message copied by Awilda Bill on Fri Jun 13, 2012  9:46 AM ------      Message from: Rosalio Macadamia      Created: Wed Jun 04, 2012  4:51 PM       Ok to report. Kidney status looks about the same. Please send to Dr. Valentina Lucks at [facility].

## 2012-06-13 NOTE — Telephone Encounter (Signed)
Message copied by Awilda Bill on Fri Jun 13, 2012  9:47 AM ------      Message from: Rosalio Macadamia      Created: Wed Jun 04, 2012  4:51 PM       Ok to report. Kidney status looks about the same. Please send to Dr. Valentina Lucks at [facility].

## 2012-06-13 NOTE — Telephone Encounter (Signed)
Left msg again today for patient to return my call for results.  Awaiting callback.  Vista Mink, CMA

## 2012-07-07 ENCOUNTER — Telehealth: Payer: Self-pay | Admitting: Cardiovascular Disease

## 2012-07-07 NOTE — Telephone Encounter (Signed)
Fu call °Pt returning your call  °

## 2012-07-07 NOTE — Telephone Encounter (Signed)
LMTCB Debbie Teniola Tseng RN  

## 2012-07-10 ENCOUNTER — Other Ambulatory Visit: Payer: Self-pay | Admitting: Cardiovascular Disease

## 2012-07-13 ENCOUNTER — Emergency Department (HOSPITAL_COMMUNITY)
Admission: EM | Admit: 2012-07-13 | Discharge: 2012-07-13 | Disposition: A | Discharge status: Home / Self Care | Payer: Medicaid Other | Attending: Emergency Medicine | Admitting: Emergency Medicine

## 2012-07-13 ENCOUNTER — Encounter (HOSPITAL_COMMUNITY): Payer: Self-pay | Admitting: *Deleted

## 2012-07-13 DIAGNOSIS — N189 Chronic kidney disease, unspecified: Secondary | ICD-10-CM | POA: Insufficient documentation

## 2012-07-13 DIAGNOSIS — E785 Hyperlipidemia, unspecified: Secondary | ICD-10-CM | POA: Insufficient documentation

## 2012-07-13 DIAGNOSIS — Z87891 Personal history of nicotine dependence: Secondary | ICD-10-CM | POA: Insufficient documentation

## 2012-07-13 DIAGNOSIS — F209 Schizophrenia, unspecified: Secondary | ICD-10-CM | POA: Insufficient documentation

## 2012-07-13 DIAGNOSIS — F319 Bipolar disorder, unspecified: Secondary | ICD-10-CM | POA: Insufficient documentation

## 2012-07-13 DIAGNOSIS — I129 Hypertensive chronic kidney disease with stage 1 through stage 4 chronic kidney disease, or unspecified chronic kidney disease: Secondary | ICD-10-CM | POA: Insufficient documentation

## 2012-07-13 DIAGNOSIS — M436 Torticollis: Secondary | ICD-10-CM

## 2012-07-13 MED ORDER — LORAZEPAM 1 MG PO TABS
1.0000 mg | ORAL_TABLET | Freq: Once | ORAL | Status: AC
  Administered 2012-07-13: 1 mg via ORAL
  Filled 2012-07-13: qty 1

## 2012-07-13 MED ORDER — HYDROCODONE-ACETAMINOPHEN 5-500 MG PO TABS: 1.0000 | ORAL_TABLET | Freq: Four times a day (QID) | ORAL | Status: AC | PRN

## 2012-07-13 MED ORDER — CYCLOBENZAPRINE HCL 10 MG PO TABS: 10.0000 mg | ORAL_TABLET | Freq: Two times a day (BID) | ORAL | Status: AC | PRN

## 2012-07-13 NOTE — ED Notes (Signed)
Pt c/o sharp neck pain radiating up back of neck, reports as happening twice. Pt c/o abdominal pain secondary to gastroparesis, states has been taking meds for these s/s. Pt c/o R ear pain associated w/ neck pain.

## 2012-07-13 NOTE — ED Provider Notes (Signed)
History     CSN: [id number]  Arrival date & time 07/13/12  0033   First MD Initiated Contact with Patient 07/13/12 0251      Chief Complaint  Patient presents with  . Neck Pain    (Consider location/radiation/quality/duration/timing/severity/associated sxs/prior treatment) HPI  Patient presents to the ER with complaints of sharp pains in his neck with muscle spasms. He says that it woke him up from his sleep and that it has happened twice. He admits that he is having some mild abd pain due to gastroparesis but is taking his meds for that right now and does not wish to be seen in regards that complaint. He states that it hurts on the right side of his neck all the way up to his ear. He denies ever having this problems before. He denies fevers, chills, nausea, vomiting diarrhea, neck stiffness or recent illness.  Past Medical History  Diagnosis Date  . Hypertension   . Bipolar disorder   . Schizophrenia     h/o hospitalized inpt therapy @ the [facility]  . 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 Hyperlipidemia   . H/O: suicide attempt 1998    was hospitalized at [facility]  . Anemia     Hx of iron infusions  . CHF (congestive heart failure)     Hx of Class IV CHF  . Gastroparesis   . Chest pain   . Abdominal pain   . Blood in stool   . N&V (nausea and vomiting)   . Generalized headaches     sinus  . Weakness   . Heart murmur   . Dysrhythmia     irreg heartbeat  . Shortness of breath     with exertion  x 2 years  . Chronic kidney disease     has fistula, not on HD  . Arthritis   . Depression   . Cocaine abuse     Past Surgical History  Procedure Date  . Mitral valve repair 05/2010  . Av fistula placement, radiocephalic 05/24/2011    Left arm  . Leg surgery   . Umbilical hernia repair 04/01/2012    Procedure: HERNIA REPAIR UMBILICAL ADULT;  Surgeon: Ardeth Sportsman, MD;   Location: [facility] OR;  Service: General;  Laterality: N/A;  supra umbilical    Family History  Problem Relation Age of Onset  . Hypertension Mother     History  Substance Use Topics  . Smoking status: Former Smoker -- 30 years    Types: Cigarettes    Quit date: 03/21/2011  . Smokeless tobacco: Never Used  . Alcohol Use: No     Quit alcohol in July of 2011      Review of Systems   HEENT: denies blurry vision or change in hearing PULMONARY: Denies difficulty breathing and SOB CARDIAC: denies chest pain or heart palpitations MUSCULOSKELETAL:  denies being unable to ambulate ABDOMEN AL: denies abdominal pain GU: denies loss of bowel or urinary control NEURO: denies numbness and tingling in extremities SKIN: no new rashes PSYCH: patient denies anxiety or depression. NECK: Pt denies having neck pain     Allergies  Cashew nut oil and Food  Home Medications   Current Outpatient Rx  Name Route Sig Dispense Refill  . ALBUTEROL SULFATE HFA 108 (90 BASE) MCG/ACT IN AERS Inhalation Inhale 2 puffs into the lungs every 4 (four) hours as  needed.    . ARIPIPRAZOLE 5 MG PO TABS Oral Take 5 mg by mouth daily.      . ASPIRIN 81 MG PO TBEC Oral Take 81 mg by mouth daily.      Marland Kitchen DOCUSATE SODIUM 100 MG PO CAPS Oral Take 100 mg by mouth 2 (two) times daily.    . FUROSEMIDE 80 MG PO TABS Oral Take 80 mg by mouth 2 (two) times daily.    Marland Kitchen HYDRALAZINE HCL 25 MG PO TABS Oral Take 50 mg by mouth 3 (three) times daily.    Marland Kitchen HYDROXYZINE PAMOATE 50 MG PO CAPS Oral Take 50 mg by mouth at bedtime.     Marland Kitchen LISINOPRIL 20 MG PO TABS Oral Take 20 mg by mouth daily.    Marland Kitchen METOPROLOL TARTRATE 50 MG PO TABS Oral Take 50 mg by mouth 2 (two) times daily.    Carma Leaven M PLUS PO TABS Oral Take 1 tablet by mouth daily.    Marland Kitchen OMEPRAZOLE 40 MG PO CPDR Oral Take 40 mg by mouth 2 (two) times daily.    Marland Kitchen PROMETHAZINE HCL 25 MG PO TABS Oral Take 25 mg by mouth every 6 (six) hours as needed. Nausea     . AMLODIPINE BESYLATE  10 MG PO TABS Oral Take 1 tablet (10 mg total) by mouth daily. 30 tablet 3  . CYCLOBENZAPRINE HCL 10 MG PO TABS Oral Take 1 tablet (10 mg total) by mouth 2 (two) times daily as needed for muscle spasms. 20 tablet 0  . HYDROCODONE-ACETAMINOPHEN 5-500 MG PO TABS Oral Take 1 tablet by mouth every 6 (six) hours as needed for pain. 8 tablet 0  . METOCLOPRAMIDE HCL 10 MG PO TABS Oral Take 10 mg by mouth 4 (four) times daily -  before meals and at bedtime.      BP 148/104  Pulse 94  Temp 97.8 F (36.6 C) (Oral)  Resp 16  Ht 5\' 7"  (1.702 m)  Wt 156 lb (70.761 kg)  BMI 24.43 kg/m2  SpO2 96%  Physical Exam  Nursing note and vitals reviewed. Constitutional: He appears well-developed and well-nourished. No distress.  HENT:  Head: Normocephalic and atraumatic.  Eyes: Pupils are equal, round, and reactive to light.  Neck: Trachea normal. Neck supple. Muscular tenderness (SCM tenderness to palpation) present. No spinous process tenderness present. No rigidity. Decreased range of motion (ude to pain) present. No edema present. No Brudzinski's sign and no Kernig's sign noted.          Equal strength to bilateral upper and lower extremities.   Neurosensory function adequate to both arms and legs.   Skin color is normal. Skin is warm and moist. I see no step off deformity, no bony tenderness.   Pt is able to ambulate without limp. Pain is relieved when sitting in certain positions. ROM is decreased due to pain. No crepitus, laceration, effusion, swelling.  Pulses are normal   Cardiovascular: Normal rate and regular rhythm.   Pulmonary/Chest: Effort normal.  Abdominal: Soft.  Neurological: He is alert.  Skin: Skin is warm and dry.    ED Course  Procedures (including critical care time)  Labs Reviewed - No data to display No results found.   1. Torticollis       MDM  Patient given Valium in the ED for muscle spasm. This helped patients symptoms greatly. He admits that he does feel  better in his neck. Pt given Rx for muscle relaxers and pain medication.  Patient with back pain. No neurological deficits. Patient is ambulatory. No warning symptoms of back pain including: loss of bowel or bladder control, night sweats, waking from sleep with back pain, unexplained fevers or weight loss, h/o cancer, IVDU, recent trauma. No concern for cauda equina, epidural abscess, or other serious cause of back pain. Conservative measures such as rest, ice/heat and pain medicine indicated with PCP follow-up if no improvement with conservative management.            Dorthula Matas, PA 07/16/12 1347

## 2012-07-13 NOTE — ED Notes (Signed)
Pt alert and oriented x4. Respirations even and unlabored, bilateral symmetrical rise and fall of chest. Skin warm and dry. In no acute distress. Denies needs.   

## 2012-07-13 NOTE — ED Notes (Signed)
Pt presents with  No acute distress.  Awoke at 11 pm this evening with sharp pains to posterior neck- reports numbness to neck and rt side of face.  Pr reports happened twice tonight and now limited hearing in rt ear. Observed pt walking from wheelchair just outside room to stretcher in room without difficulty

## 2012-07-15 ENCOUNTER — Ambulatory Visit (INDEPENDENT_AMBULATORY_CARE_PROVIDER_SITE_OTHER): Payer: Medicaid Other | Admitting: Cardiovascular Disease

## 2012-07-15 ENCOUNTER — Encounter: Payer: Self-pay | Admitting: Cardiovascular Disease

## 2012-07-15 VITALS — BP 152/100 | HR 91 | Ht 65.0 in | Wt 149.8 lb

## 2012-07-15 DIAGNOSIS — I059 Rheumatic mitral valve disease, unspecified: Secondary | ICD-10-CM

## 2012-07-15 DIAGNOSIS — I5033 Acute on chronic diastolic (congestive) heart failure: Secondary | ICD-10-CM

## 2012-07-15 DIAGNOSIS — I10 Essential (primary) hypertension: Secondary | ICD-10-CM

## 2012-07-15 MED ORDER — AMLODIPINE BESYLATE 10 MG PO TABS: 10.0000 mg | ORAL_TABLET | Freq: Every day | ORAL | Status: DC

## 2012-07-15 NOTE — Assessment & Plan Note (Signed)
[place] Heart Association class III symptoms. His CHF is related to mitral valve disease and hypertensive heart disease. Discussion as above.

## 2012-07-15 NOTE — Patient Instructions (Addendum)
Your physician recommends that you schedule a follow-up appointment in: 2 months with Norma Fredrickson, PA Your physician has recommended you make the following change in your medication: INCREASE Amlodipine to 10 mg daily

## 2012-07-15 NOTE — Assessment & Plan Note (Signed)
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 He has been clean from drug use now for over a month and he seems to be motivated to turn himself around. He's been taking his medications. His blood pressure control is still suboptimal. He did miss his lunchtime dose of hydralazine today. I reinforced the importance of needing to take this 3 times every day. I would like to increase his amlodipine to 10 mg daily. He otherwise will continue on his same program. I like him to be seen back in 2 months by Norma Fredrickson.

## 2012-07-15 NOTE — Assessment & Plan Note (Signed)
Moderate mitral regurgitation following mitral valve surgery. By exam his mitral regurgitation is at least moderate. Will try to improve blood pressure control and followup with him in a few months. If he continues to be limited by shortness of breath with class III symptoms, I think we should repeat a transesophageal echocardiogram. He understands the importance of staying drug free so that he can be considered a reasonable candidate for redo cardiac surgery if required.

## 2012-07-15 NOTE — Progress Notes (Signed)
HPI:  51 year old gentleman with CHF and mitral regurgitation presenting for followup evaluation. The patient underwent mitral valve repair in 2011. His situation is complicated by significant chronic kidney disease and difficult history of polysubstance abuse. He has residual mitral regurgitation after his repair. His blood pressure has been very difficult to control. We have not been certain whether this is related to medication noncompliance or refractory hypertension. He also has chronic nausea and vomiting related to gastroparesis.  Overall the patient has been doing better. He continues to have shortness of breath with low moderate level of exertion, but he feels improved at rest. His blood pressure remains uncontrolled. He has been clean for several weeks now and he is working hard to stay off of drugs. He is very motivated. He understands this is of critical importance for his health. He's had no chest pain or edema. He does report orthopnea and PND on occasion.  Outpatient Encounter Prescriptions as of 07/15/2012  Medication Sig Dispense Refill  . albuterol (PROVENTIL HFA;VENTOLIN HFA) 108 (90 BASE) MCG/ACT inhaler Inhale 2 puffs into the lungs every 4 (four) hours as needed.      Marland Kitchen amLODipine (NORVASC) 5 MG tablet Take 1 tablet (5 mg total) by mouth daily.  30 tablet  3  . ARIPiprazole (ABILIFY) 5 MG tablet Take 5 mg by mouth daily.        Marland Kitchen aspirin 81 MG EC tablet Take 81 mg by mouth daily.        . cyclobenzaprine (FLEXERIL) 10 MG tablet Take 1 tablet (10 mg total) by mouth 2 (two) times daily as needed for muscle spasms.  20 tablet  0  . docusate sodium (COLACE) 100 MG capsule Take 100 mg by mouth 2 (two) times daily.      . furosemide (LASIX) 80 MG tablet Take 80 mg by mouth 2 (two) times daily.      . hydrALAZINE (APRESOLINE) 25 MG tablet Take 50 mg by mouth 3 (three) times daily.      Marland Kitchen HYDROcodone-acetaminophen (VICODIN) 5-500 MG per tablet Take 1 tablet by mouth every 6 (six) hours  as needed for pain.  8 tablet  0  . hydrOXYzine (VISTARIL) 50 MG capsule Take 50 mg by mouth at bedtime.       Marland Kitchen lisinopril (PRINIVIL,ZESTRIL) 20 MG tablet Take 20 mg by mouth daily.      . metoCLOPramide (REGLAN) 10 MG tablet Take 10 mg by mouth 4 (four) times daily -  before meals and at bedtime.      . metoprolol (LOPRESSOR) 50 MG tablet Take 50 mg by mouth 2 (two) times daily.      . Multiple Vitamins-Minerals (MULTIVITAMINS THER. W/MINERALS) TABS Take 1 tablet by mouth daily.      Marland Kitchen omeprazole (PRILOSEC) 40 MG capsule Take 40 mg by mouth 2 (two) times daily.      . promethazine (PHENERGAN) 25 MG tablet Take 25 mg by mouth every 6 (six) hours as needed. Nausea         Allergies  Allergen Reactions  . Cashew Nut Oil Anaphylaxis  . Food Anaphylaxis    Strawberries, Cashews, causes throat to swell - affects breathing    Past Medical History  Diagnosis Date  . Hypertension   . Bipolar disorder   . Schizophrenia     h/o hospitalized inpt therapy @ the [facility]  . MVP (mitral valve prolapse)     with severe MR s/p repair with annuloplasty and cleft  MV repair in 09/2010  . Hyperlipidemia   . H/O: suicide attempt 1998    was hospitalized at [facility]  . Anemia     Hx of iron infusions  . CHF (congestive heart failure)     Hx of Class IV CHF  . Gastroparesis   . Chest pain   . Abdominal pain   . Blood in stool   . N&V (nausea and vomiting)   . Generalized headaches     sinus  . Weakness   . Heart murmur   . Dysrhythmia     irreg heartbeat  . Shortness of breath     with exertion  x 2 years  . Chronic kidney disease     has fistula, not on HD  . Arthritis   . Depression   . Cocaine abuse    ROS: Negative except as per HPI  BP 152/100  Pulse 91  Ht 5\' 5"  (1.651 m)  Wt 149 lb 12.8 oz (67.949 kg)  BMI 24.93 kg/m2  SpO2 99%  PHYSICAL EXAM: Pt is alert and oriented, pleasant male in NAD HEENT: normal Neck: JVP - normal, carotids 2+= with  bilateral bruits Lungs: CTA bilaterally CV: RRR with grade 3/6 holosystolic murmur at the apex and also heard in the left parascapular region Abd: soft, positive bowel sounds, positive tenderness without rebound or guarding over the midepigastrium Ext: no C/C/E, distal pulses intact and equal Skin: warm/dry no rash  ECHO: 05/10/2012: Study Conclusions  - Left ventricle: The cavity size was normal. Wall thickness was increased in a pattern of mild LVH. Systolic function was normal. The estimated ejection fraction was in the range of 55% to 60%. Wall motion was normal; there were no regional wall motion abnormalities. Doppler parameters are consistent with restrictive physiology, indicative of decreased left ventricular diastolic compliance and/or increased left atrial pressure.However doppler pattern has decreased specificity due to mitral regurgitation Doppler parameters are consistent with both elevated ventricular end-diastolic filling pressure and elevated left atrial filling pressure. - Mitral valve: Prior procedures included surgical repair. An annular ring prosthesis was present. Moderate regurgitation. Valve area by pressure half-time: 2.16cm^2. - Left atrium: The atrium was moderately to severely dilated. - Right ventricle: The cavity size was mildly dilated. Systolic function was normal. - Right atrium: The atrium was moderately to severely dilated. - Tricuspid valve: There was malcoaptation of the valve leaflets. Cannot exclude vegetation. There was a possible, small, mobile vegetation on the anterior leaflet versus partial chordal rupture anterior tricuspid leaflet. Clinical correlation needed Severe regurgitation. - Pulmonary arteries: PA peak pressure: 43mm Hg (S). - Inferior vena cava: Findings are consistent with elevated central venous pressure. Estimated RAP=10-53mmHg - Hepatic veins: The flow pattern showed systolic flow reversal, suggestive of severe tricuspid  regurgitation. - Pericardium, extracardiac: There was no pericardial effusion. There was a left pleural effusion. Impressions:  - The right ventricular systolic pressure was increased consistent with mild pulmonary hypertension.  ASSESSMENT AND PLAN: 1. Chronic diastolic heart failure related to valvular heart disease and malignant hypertension. Will increase the patient's amlodipine to 10 mg daily. Otherwise recommend continue his current medical program with outpatient followup in 2 months.  2. Mitral regurgitation, moderate. By exam I think his mitral regurgitation is at least moderate. He also has significant tricuspid regurgitation. Will continue to work on medical therapy and plan on followup echocardiogram in several months.  3. Cocaine abuse. The patient remains clean. He has received extensive counseling regarding his need to stay off  of drugs.  4. Chronic kidney disease, stage IV. He follows up with nephrology on a regular basis.  Tonny Bollman

## 2012-07-17 NOTE — ED Provider Notes (Signed)
Medical screening examination/treatment/procedure(s) were conducted as a shared visit with non-physician practitioner(s) and myself.  I personally evaluated the patient during the encounter.  No clinical evidence of meningitis. Pain is more lateral and muscular  Donnetta Hutching, MD 07/17/12 1745

## 2012-08-02 ENCOUNTER — Emergency Department (HOSPITAL_COMMUNITY): Payer: Medicaid Other

## 2012-08-02 ENCOUNTER — Inpatient Hospital Stay (HOSPITAL_COMMUNITY)
Admission: EM | Admit: 2012-08-02 | Discharge: 2012-08-04 | DRG: 292 | Disposition: A | Discharge status: Home / Self Care | Payer: Medicaid Other | Attending: Cardiovascular Disease | Admitting: Cardiovascular Disease

## 2012-08-02 ENCOUNTER — Encounter (HOSPITAL_COMMUNITY): Payer: Self-pay | Admitting: *Deleted

## 2012-08-02 DIAGNOSIS — I129 Hypertensive chronic kidney disease with stage 1 through stage 4 chronic kidney disease, or unspecified chronic kidney disease: Secondary | ICD-10-CM | POA: Diagnosis present

## 2012-08-02 DIAGNOSIS — I340 Nonrheumatic mitral (valve) insufficiency: Secondary | ICD-10-CM | POA: Diagnosis present

## 2012-08-02 DIAGNOSIS — I509 Heart failure, unspecified: Secondary | ICD-10-CM | POA: Diagnosis present

## 2012-08-02 DIAGNOSIS — I5033 Acute on chronic diastolic (congestive) heart failure: Principal | ICD-10-CM | POA: Diagnosis present

## 2012-08-02 DIAGNOSIS — K3184 Gastroparesis: Secondary | ICD-10-CM | POA: Diagnosis present

## 2012-08-02 DIAGNOSIS — F411 Generalized anxiety disorder: Secondary | ICD-10-CM | POA: Diagnosis present

## 2012-08-02 DIAGNOSIS — E785 Hyperlipidemia, unspecified: Secondary | ICD-10-CM | POA: Diagnosis present

## 2012-08-02 DIAGNOSIS — I10 Essential (primary) hypertension: Secondary | ICD-10-CM | POA: Diagnosis present

## 2012-08-02 DIAGNOSIS — K219 Gastro-esophageal reflux disease without esophagitis: Secondary | ICD-10-CM | POA: Diagnosis present

## 2012-08-02 DIAGNOSIS — N184 Chronic kidney disease, stage 4 (severe): Secondary | ICD-10-CM | POA: Diagnosis present

## 2012-08-02 DIAGNOSIS — F1410 Cocaine abuse, uncomplicated: Secondary | ICD-10-CM | POA: Diagnosis present

## 2012-08-02 DIAGNOSIS — Z87891 Personal history of nicotine dependence: Secondary | ICD-10-CM

## 2012-08-02 DIAGNOSIS — F319 Bipolar disorder, unspecified: Secondary | ICD-10-CM | POA: Diagnosis present

## 2012-08-02 DIAGNOSIS — D649 Anemia, unspecified: Secondary | ICD-10-CM | POA: Diagnosis present

## 2012-08-02 DIAGNOSIS — I059 Rheumatic mitral valve disease, unspecified: Secondary | ICD-10-CM | POA: Diagnosis present

## 2012-08-02 LAB — POCT I-STAT TROPONIN I
Troponin i, poc: 0.02 ng/mL (ref 0.00–0.08)
Troponin i, poc: 0.02 ng/mL (ref 0.00–0.08)

## 2012-08-02 LAB — BASIC METABOLIC PANEL
CO2: 21 mEq/L (ref 19–32)
Chloride: 100 mEq/L (ref 96–112)
Creatinine, Ser: 3.04 mg/dL — ABNORMAL HIGH (ref 0.50–1.35)
Potassium: 3.8 mEq/L (ref 3.5–5.1)

## 2012-08-02 LAB — CBC
Hemoglobin: 9.8 g/dL — ABNORMAL LOW (ref 13.0–17.0)
MCH: 29.3 pg (ref 26.0–34.0)
RBC: 3.34 MIL/uL — ABNORMAL LOW (ref 4.22–5.81)

## 2012-08-02 LAB — D-DIMER, QUANTITATIVE: D-Dimer, Quant: 0.71 ug/mL-FEU — ABNORMAL HIGH (ref 0.00–0.48)

## 2012-08-02 LAB — PRO B NATRIURETIC PEPTIDE: Pro B Natriuretic peptide (BNP): 2258 pg/mL — ABNORMAL HIGH (ref 0–125)

## 2012-08-02 MED ORDER — ALBUTEROL SULFATE (5 MG/ML) 0.5% IN NEBU
5.0000 mg | INHALATION_SOLUTION | Freq: Once | RESPIRATORY_TRACT | Status: AC
  Administered 2012-08-02: 5 mg via RESPIRATORY_TRACT
  Filled 2012-08-02: qty 1

## 2012-08-02 MED ORDER — IPRATROPIUM BROMIDE 0.02 % IN SOLN
0.5000 mg | Freq: Once | RESPIRATORY_TRACT | Status: AC
  Administered 2012-08-02: 0.5 mg via RESPIRATORY_TRACT
  Filled 2012-08-02 (×2): qty 2.5

## 2012-08-02 MED ORDER — LORAZEPAM 2 MG/ML IJ SOLN
1.0000 mg | Freq: Once | INTRAMUSCULAR | Status: AC
  Administered 2012-08-02: 1 mg via INTRAVENOUS
  Filled 2012-08-02: qty 1

## 2012-08-02 MED ORDER — METHYLPREDNISOLONE SODIUM SUCC 125 MG IJ SOLR
125.0000 mg | Freq: Once | INTRAMUSCULAR | Status: AC
  Administered 2012-08-02: 125 mg via INTRAVENOUS
  Filled 2012-08-02: qty 2

## 2012-08-02 NOTE — H&P (Signed)
Patient ID: Ryan Winters MRN: [id number], DOB/AGE: 51-Apr-1962   Admit date: 08/02/2012   Primary Physician: Jackie Plum, MD Primary Cardiologist: Excell Seltzer ([facility])  Pt. Profile:  Shortness of breath  Problem List  Past Medical History  Diagnosis Date  . Hypertension   . Bipolar disorder   . Schizophrenia     h/o hospitalized inpt therapy @ the [facility]  . 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   Marland Kitchen Hyperlipidemia   . H/O: suicide attempt 1998    was hospitalized at [facility]  . Anemia     Hx of iron infusions  . CHF (congestive heart failure)     Hx of Class IV CHF  . Gastroparesis   . Chest pain   . Abdominal pain   . Blood in stool   . N&V (nausea and vomiting)   . Generalized headaches     sinus  . Weakness   . Heart murmur   . Dysrhythmia     irreg heartbeat  . Shortness of breath     with exertion  x 2 years  . Chronic kidney disease     has fistula, not on HD  . Arthritis   . Depression   . Cocaine abuse     Past Surgical History  Procedure Date  . Mitral valve repair 05/2010  . Av fistula placement, radiocephalic 05/24/2011    Left arm  . Leg surgery   . Umbilical hernia repair 04/01/2012    Procedure: HERNIA REPAIR UMBILICAL ADULT;  Surgeon: Ardeth Sportsman, MD;  Location: [facility] OR;  Service: General;  Laterality: N/A;  supra umbilical     Allergies  Allergies  Allergen Reactions  . Cashew Nut Oil Anaphylaxis  . Food Anaphylaxis    Strawberries, Cashews, causes throat to swell - affects breathing    HPI  This is a 51yo WM with hx of mitral valve repair (most recent echo shows moderate MR), normal LV systolic function with restrictive filling pattern, CKD, and significant baseline functional limitations (NYHA Class III symptoms).  He was last seen by Dr. Excell Seltzer end of  August.  Since then he says he was at baseline in terms of symptoms and 2 days ago, his symptoms have been somewhat worse.  He can walk a few yards at baseline but now can walk a few steps and gets dyspnea.  He is taking all his meds as prescribed.  Denies change in salt intake. He weighs himself occasionally and has not noted change in his weight.  He has significant orthopnea that hasnt changed much recently. Patient presents to ER for further evaluation.  Home Medications  Prior to Admission medications   Medication Sig Start Date End Date Taking? Authorizing Provider  albuterol (PROVENTIL HFA;VENTOLIN HFA) 108 (90 BASE) MCG/ACT inhaler Inhale 2 puffs into the lungs every 4 (four) hours as needed. 04/22/12 04/22/13 Yes Lyanne Co, MD  amLODipine (NORVASC) 10 MG tablet Take 10 mg by mouth daily. 07/15/12 07/15/13 Yes Tonny Bollman, MD  ARIPiprazole (ABILIFY) 5 MG tablet Take 5 mg by mouth daily.     Yes Historical Provider, MD  aspirin 81 MG EC tablet Take 81 mg by mouth daily.     Yes Historical Provider, MD  docusate sodium (COLACE) 100 MG capsule Take 100 mg by mouth 2 (two) times  daily.   Yes Historical Provider, MD  hydrALAZINE (APRESOLINE) 25 MG tablet Take 50 mg by mouth 3 (three) times daily. 05/11/12 05/11/13 Yes Prescott Parma, PA  hydrOXYzine (VISTARIL) 50 MG capsule Take 50 mg by mouth at bedtime.    Yes Historical Provider, MD  lisinopril (PRINIVIL,ZESTRIL) 20 MG tablet Take 20 mg by mouth daily.   Yes Historical Provider, MD  metoCLOPramide (REGLAN) 10 MG tablet Take 10 mg by mouth 4 (four) times daily -  before meals and at bedtime. 03/25/12  Yes Ardeth Sportsman, MD  metoprolol (LOPRESSOR) 50 MG tablet Take 50 mg by mouth 2 (two) times daily.   Yes Historical Provider, MD  omeprazole (PRILOSEC) 40 MG capsule Take 40 mg by mouth 2 (two) times daily. 03/25/12 03/25/13 Yes Ardeth Sportsman, MD    Family History  Family History  Problem Relation Age of Onset  . Hypertension Mother      Social History  History   Social History  . Marital Status: Married    Spouse Name: N/A    Number of Children: N/A  . Years of Education: N/A   Occupational History  . disabled    Social History Main Topics  . Smoking status: Former Smoker -- 30 years    Types: Cigarettes    Quit date: 03/21/2011  . Smokeless tobacco: Never Used  . Alcohol Use: No     Quit alcohol in July of 2011  . Drug Use: Yes    Special: Cocaine, Marijuana     None since November when had rectal bleeding and went to hospital  . Sexually Active: No   Other Topics Concern  . Not on file   Social History Narrative   He lives at home with his wife and has a sister. He denies any cigs, drugs, alcohol at present.      Review of Systems General:  No chills, fever, night sweats or weight changes.  Cardiovascular:  As HPI Dermatological: No rash, lesions/masses Respiratory: as in HPI Urologic: No hematuria, dysuria Abdominal:   No nausea, vomiting, diarrhea, bright red blood per rectum, melena, or hematemesis Neurologic:  No visual changes, wkns, changes in mental status. All other systems reviewed and are otherwise negative except as noted above.  Physical Exam  Blood pressure 162/107, pulse 100, temperature 99.1 F (37.3 C), temperature source Oral, resp. rate 23, SpO2 100.00%.  General: Pleasant, NAD Psych: Normal affect. Neuro: Alert and oriented X 3. Moves all extremities spontaneously. HEENT: Normal  Neck: Supple without bruits or JVD. Lungs:  Resp regular and unlabored, CTA. Heart: RRR no s3, s4, or 3/6 holosystolic murmur at apex radiating to axilla Abdomen: Soft, non-tender, non-distended, BS + x 4.  Extremities: No clubbing, cyanosis or edema. DP/PT/Radials 2+ and equal bilaterally.  Labs  No results found for this basename: CKTOTAL:4,CKMB:4,TROPONINI:4 in the last 72 hours Lab Results  Component Value Date   WBC 7.2 08/02/2012   HGB 9.8* 08/02/2012   HCT 29.6* 08/02/2012   MCV  88.6 08/02/2012   PLT 128* 08/02/2012    Lab 08/02/12 1858  NA 133*  K 3.8  CL 100  CO2 21  BUN 43*  CREATININE 3.04*  CALCIUM 9.1  PROT --  BILITOT --  ALKPHOS --  ALT --  AST --  GLUCOSE 104*   Lab Results  Component Value Date   CHOL  Value: 134        ATP III CLASSIFICATION:  <200     mg/dL  Desirable  200-239  mg/dL   Borderline High  >=308    mg/dL   High        04/23/7845   HDL 63 05/25/2010   LDLCALC  Value: 63        Total Cholesterol/HDL:CHD Risk Coronary Heart Disease Risk Table                     Men   Women  1/2 Average Risk   3.4   3.3  Average Risk       5.0   4.4  2 X Average Risk   9.6   7.1  3 X Average Risk  23.4   11.0        Use the calculated Patient Ratio above and the CHD Risk Table to determine the patient's CHD Risk.        ATP III CLASSIFICATION (LDL):  <100     mg/dL   Optimal  962-952  mg/dL   Near or Above                    Optimal  130-159  mg/dL   Borderline  841-324  mg/dL   High  >401     mg/dL   Very High 0/12/7251   TRIG 39 05/25/2010   Lab Results  Component Value Date   DDIMER 0.71* 08/02/2012     Radiology/Studies  Dg Chest 2 View  08/02/2012  *RADIOLOGY REPORT*  Clinical Data: Chest pain.  Short of breath.  Congestive heart failure.  CHEST - 2 VIEW  Comparison: 06/04/2012  Findings: Artifact overlies chest.  There has been previous median sternotomy and mitral valve replacement.  The heart is enlarged. There may be venous hypertension but there is no frank edema.  No effusions.  No focal pulmonary abnormality.  IMPRESSION: Previous mitral valve replacement.  Cardiomegaly.  Possible venous hypertension without frank edema.   Original Report Authenticated By: Thomasenia Sales, M.D.     ECG  NSR, LVH  ASSESSMENT   1) Acute worsening of heart failure symptoms (ambulatory NYHA class IV) - normal LV systolic function with restrictive filling pattern and moderate MR per TTE 2) hx of mitral valve repair 3) HTN, uncontrolled 4) prior hx of cocaine  use  5) HLD  PLAN 1) Admit for treatment of heart failure symptoms with IV lasix 80mg  bid, daily wt, i/o, low sodium diet 2) May need TEE to better assess mitral valve and decide if redo surgery is needed 3) Increase hydralazine and metoprolol to better control BP 4) Heparin for DVT proph   Signed, Kirk Ruths, MD 08/02/2012, 11:32 PM

## 2012-08-02 NOTE — ED Notes (Signed)
Ativan given for that.

## 2012-08-02 NOTE — ED Provider Notes (Signed)
History     CSN: [id number]  Arrival date & time 08/02/12  Ryan Winters   First MD Initiated Contact with Patient 08/02/12 1901      Chief Complaint  Patient presents with  . Chest Pain  . Shortness of Breath    (Consider location/radiation/quality/duration/timing/severity/associated sxs/prior treatment) Patient is a 51 y.o. male presenting with chest pain and shortness of breath. The history is provided by the patient and a relative.  Chest Pain Primary symptoms include shortness of breath.    Shortness of Breath  Associated symptoms include chest pain and shortness of breath.   patient here with cough and chest pain since yesterday. Chest pain is described as sharp and worse with taking deep breath. Cough has been nonproductive and not associated with fever. Does have a history of asthma and has not been taking his medications appropriately. Also has a history of CHF and notes some increased lower extremity edema.  Past Medical History  Diagnosis Date  . Hypertension   . Bipolar disorder   . Schizophrenia     h/o hospitalized inpt therapy @ the [facility]  . 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 Hyperlipidemia   . H/O: suicide attempt 1998    was hospitalized at [facility]  . Anemia     Hx of iron infusions  . CHF (congestive heart failure)     Hx of Class IV CHF  . Gastroparesis   . Chest pain   . Abdominal pain   . Blood in stool   . N&V (nausea and vomiting)   . Generalized headaches     sinus  . Weakness   . Heart murmur   . Dysrhythmia     irreg heartbeat  . Shortness of breath     with exertion  x 2 years  . Chronic kidney disease     has fistula, not on HD  . Arthritis   . Depression   . Cocaine abuse     Past Surgical History  Procedure Date  . Mitral valve repair 05/2010  . Av fistula placement, radiocephalic 05/24/2011    Left arm  . Leg surgery   . Umbilical  hernia repair 04/01/2012    Procedure: HERNIA REPAIR UMBILICAL ADULT;  Surgeon: Ardeth Sportsman, MD;  Location: [facility] OR;  Service: General;  Laterality: N/A;  supra umbilical    Family History  Problem Relation Age of Onset  . Hypertension Mother     History  Substance Use Topics  . Smoking status: Former Smoker -- 30 years    Types: Cigarettes    Quit date: 03/21/2011  . Smokeless tobacco: Never Used  . Alcohol Use: No     Quit alcohol in July of 2011      Review of Systems  Respiratory: Positive for shortness of breath.   Cardiovascular: Positive for chest pain.  All other systems reviewed and are negative.    Allergies  Cashew nut oil and Food  Home Medications   Current Outpatient Rx  Name Route Sig Dispense Refill  . ALBUTEROL SULFATE HFA 108 (90 BASE) MCG/ACT IN AERS Inhalation Inhale 2 puffs into the lungs every 4 (four) hours as needed.    Marland Kitchen AMLODIPINE BESYLATE 10 MG PO TABS Oral Take 10 mg by mouth daily.    . ARIPIPRAZOLE 5 MG PO TABS Oral Take 5 mg by mouth daily.      Marland Kitchen  ASPIRIN 81 MG PO TBEC Oral Take 81 mg by mouth daily.      Marland Kitchen DOCUSATE SODIUM 100 MG PO CAPS Oral Take 100 mg by mouth 2 (two) times daily.    . FUROSEMIDE 80 MG PO TABS Oral Take 80 mg by mouth 2 (two) times daily.    Marland Kitchen HYDRALAZINE HCL 25 MG PO TABS Oral Take 50 mg by mouth 3 (three) times daily.    Marland Kitchen HYDROXYZINE PAMOATE 50 MG PO CAPS Oral Take 50 mg by mouth at bedtime.     Marland Kitchen LISINOPRIL 20 MG PO TABS Oral Take 20 mg by mouth daily.    Marland Kitchen METOCLOPRAMIDE HCL 10 MG PO TABS Oral Take 10 mg by mouth 4 (four) times daily -  before meals and at bedtime.    Marland Kitchen METOPROLOL TARTRATE 50 MG PO TABS Oral Take 50 mg by mouth 2 (two) times daily.    Carma Leaven M PLUS PO TABS Oral Take 1 tablet by mouth daily.    Marland Kitchen OMEPRAZOLE 40 MG PO CPDR Oral Take 40 mg by mouth 2 (two) times daily.    Marland Kitchen PROMETHAZINE HCL 25 MG PO TABS Oral Take 25 mg by mouth every 6 (six) hours as needed. Nausea       BP 158/122  Pulse 93   Temp 98.1 F (36.7 C) (Oral)  Resp 21  SpO2 100%  Physical Exam  Nursing note and vitals reviewed. Constitutional: He is oriented to person, place, and time. He appears well-developed and well-nourished.  Non-toxic appearance. No distress.  HENT:  Head: Normocephalic and atraumatic.  Eyes: Conjunctivae normal, EOM and lids are normal. Pupils are equal, round, and reactive to light.  Neck: Normal range of motion. Neck supple. No tracheal deviation present. No mass present.  Cardiovascular: Normal rate, regular rhythm and normal heart sounds.  Exam reveals no gallop.   No murmur heard. Pulmonary/Chest: Effort normal. No stridor. No respiratory distress. He has decreased breath sounds. He has no wheezes. He has no rhonchi. He has no rales.  Abdominal: Soft. Normal appearance and bowel sounds are normal. He exhibits no distension. There is no tenderness. There is no rebound and no CVA tenderness.  Musculoskeletal: Normal range of motion. He exhibits no edema and no tenderness.  Neurological: He is alert and oriented to person, place, and time. He has normal strength. No cranial nerve deficit or sensory deficit. GCS eye subscore is 4. GCS verbal subscore is 5. GCS motor subscore is 6.  Skin: Skin is warm and dry. No abrasion and no rash noted.  Psychiatric: He has a normal mood and affect. His speech is normal and behavior is normal.    ED Course  Procedures (including critical care time)   Labs Reviewed  CBC  PRO B NATRIURETIC PEPTIDE  BASIC METABOLIC PANEL  PROTIME-INR  PROTIME-INR  D-DIMER, QUANTITATIVE   No results found.   No diagnosis found.    MDM   Date: 08/02/2012  Rate: 93  Rhythm: normal sinus rhythm  QRS Axis: normal  Intervals: normal  ST/T Wave abnormalities: nonspecific T wave changes  Conduction Disutrbances:none  Narrative Interpretation:   Old EKG Reviewed: unchanged  9:01 PM Patient with early CHF. Will give Lasix here and have spoken with [facility] and they will admit the patient        Toy Baker, MD 08/02/12 2101

## 2012-08-02 NOTE — ED Notes (Signed)
[facility] called. 

## 2012-08-02 NOTE — ED Notes (Signed)
Pt has "nerve problems" that cause him to spasm and shake his legs.

## 2012-08-02 NOTE — ED Notes (Addendum)
Pt reports left sided chest pain that began last night. Hx of mitral valve repair 05/2009. Pain sharp, shooting, radiates to left arm.

## 2012-08-02 NOTE — ED Notes (Signed)
Pt reported cardiologist chest pain occurred only when he coughed.

## 2012-08-03 DIAGNOSIS — I509 Heart failure, unspecified: Secondary | ICD-10-CM

## 2012-08-03 LAB — BASIC METABOLIC PANEL
BUN: 50 mg/dL — ABNORMAL HIGH (ref 6–23)
Chloride: 102 mEq/L (ref 96–112)
Glucose, Bld: 139 mg/dL — ABNORMAL HIGH (ref 70–99)
Potassium: 4 mEq/L (ref 3.5–5.1)
Sodium: 137 mEq/L (ref 135–145)

## 2012-08-03 MED ORDER — FUROSEMIDE 10 MG/ML IJ SOLN
80.0000 mg | Freq: Two times a day (BID) | INTRAMUSCULAR | Status: DC
  Administered 2012-08-04: 80 mg via INTRAVENOUS
  Filled 2012-08-03 (×2): qty 8

## 2012-08-03 MED ORDER — METOCLOPRAMIDE HCL 10 MG PO TABS
10.0000 mg | ORAL_TABLET | Freq: Three times a day (TID) | ORAL | Status: DC
  Administered 2012-08-03 – 2012-08-04 (×5): 10 mg via ORAL
  Filled 2012-08-03 (×9): qty 1

## 2012-08-03 MED ORDER — AMLODIPINE BESYLATE 10 MG PO TABS
10.0000 mg | ORAL_TABLET | Freq: Every day | ORAL | Status: DC
  Administered 2012-08-03 – 2012-08-04 (×2): 10 mg via ORAL
  Filled 2012-08-03 (×2): qty 1

## 2012-08-03 MED ORDER — METOPROLOL TARTRATE 50 MG PO TABS
75.0000 mg | ORAL_TABLET | Freq: Two times a day (BID) | ORAL | Status: DC
  Administered 2012-08-03 (×3): 75 mg via ORAL
  Filled 2012-08-03 (×5): qty 1

## 2012-08-03 MED ORDER — LISINOPRIL 20 MG PO TABS
20.0000 mg | ORAL_TABLET | Freq: Every day | ORAL | Status: DC
  Administered 2012-08-03 – 2012-08-04 (×2): 20 mg via ORAL
  Filled 2012-08-03 (×2): qty 1

## 2012-08-03 MED ORDER — HYDROXYZINE HCL 50 MG PO TABS
50.0000 mg | ORAL_TABLET | Freq: Every day | ORAL | Status: DC
  Administered 2012-08-03 (×2): 50 mg via ORAL
  Filled 2012-08-03 (×3): qty 1

## 2012-08-03 MED ORDER — HYDRALAZINE HCL 50 MG PO TABS
100.0000 mg | ORAL_TABLET | Freq: Three times a day (TID) | ORAL | Status: DC
  Administered 2012-08-03 – 2012-08-04 (×4): 100 mg via ORAL
  Filled 2012-08-03 (×6): qty 2

## 2012-08-03 MED ORDER — ALBUTEROL SULFATE HFA 108 (90 BASE) MCG/ACT IN AERS
2.0000 | INHALATION_SPRAY | RESPIRATORY_TRACT | Status: DC | PRN
  Filled 2012-08-03: qty 6.7

## 2012-08-03 MED ORDER — AMLODIPINE BESYLATE 10 MG PO TABS: 10.0000 mg | ORAL_TABLET | Freq: Every day | ORAL | Status: DC

## 2012-08-03 MED ORDER — PANTOPRAZOLE SODIUM 40 MG PO TBEC
40.0000 mg | DELAYED_RELEASE_TABLET | Freq: Every day | ORAL | Status: DC
  Administered 2012-08-03 – 2012-08-04 (×2): 40 mg via ORAL
  Filled 2012-08-03: qty 1

## 2012-08-03 MED ORDER — DOCUSATE SODIUM 100 MG PO CAPS
100.0000 mg | ORAL_CAPSULE | Freq: Two times a day (BID) | ORAL | Status: DC
  Administered 2012-08-03 – 2012-08-04 (×4): 100 mg via ORAL
  Filled 2012-08-03 (×5): qty 1

## 2012-08-03 MED ORDER — HEPARIN SODIUM (PORCINE) 5000 UNIT/ML IJ SOLN
5000.0000 [IU] | Freq: Three times a day (TID) | INTRAMUSCULAR | Status: DC
  Administered 2012-08-03 (×3): 5000 [IU] via SUBCUTANEOUS
  Filled 2012-08-03 (×7): qty 1

## 2012-08-03 MED ORDER — ARIPIPRAZOLE 5 MG PO TABS
5.0000 mg | ORAL_TABLET | Freq: Every day | ORAL | Status: DC
  Administered 2012-08-03: 5 mg via ORAL
  Filled 2012-08-03 (×2): qty 1

## 2012-08-03 MED ORDER — ASPIRIN 81 MG PO CHEW
81.0000 mg | CHEWABLE_TABLET | Freq: Every day | ORAL | Status: DC
  Administered 2012-08-03 – 2012-08-04 (×2): 81 mg via ORAL
  Filled 2012-08-03 (×2): qty 1

## 2012-08-03 MED ORDER — ONDANSETRON HCL 4 MG/2ML IJ SOLN: 4.0000 mg | Freq: Four times a day (QID) | INTRAMUSCULAR | Status: DC | PRN

## 2012-08-03 MED ORDER — FUROSEMIDE 10 MG/ML IJ SOLN
80.0000 mg | Freq: Two times a day (BID) | INTRAMUSCULAR | Status: DC
  Administered 2012-08-03 (×2): 80 mg via INTRAVENOUS
  Filled 2012-08-03 (×3): qty 8

## 2012-08-03 MED ORDER — ACETAMINOPHEN 325 MG PO TABS: 650.0000 mg | ORAL_TABLET | ORAL | Status: DC | PRN

## 2012-08-03 MED ORDER — SODIUM CHLORIDE 0.9 % IV SOLN: 250.0000 mL | INTRAVENOUS | Status: DC | PRN

## 2012-08-03 MED ORDER — SODIUM CHLORIDE 0.9 % IJ SOLN
3.0000 mL | Freq: Two times a day (BID) | INTRAMUSCULAR | Status: DC
  Administered 2012-08-03 – 2012-08-04 (×3): 3 mL via INTRAVENOUS

## 2012-08-03 MED ORDER — SODIUM CHLORIDE 0.9 % IJ SOLN: 3.0000 mL | INTRAMUSCULAR | Status: DC | PRN

## 2012-08-03 NOTE — Progress Notes (Signed)
Patient ID: Ryan Winters, male   DOB: 07/21/1961, 51 y.o.   MRN: [id number]   Patient Name: Ryan Winters Date of Encounter: 08/03/2012    SUBJECTIVE  Ryan Winters is feeling better this morning after diuresis, specifically his shortness of breath and tightness in his chest. He has had chronic shortness of breath and tightness in his chest and his mitral valve repair several years ago.  When questioned, he does not weigh himself daily. He says his weight had not changed as of last Wednesday. He states to smoke from a grill at a family reunion is what tripped off his shortness of breath yesterday. He had a bad episode of shortness of breath responding to his inhaler on Wednesday night.  Chest x-ray on admission showed some vascular congestion but no obvious edema. He does not have pleural effusions.  He has been free of cocaine for 6 months.  His creatinine is chronically elevated in the threes. Had a mild increase in d-dimer at [facility] long but his presentation is not worrisome for pulmonary embolus. Creatinine precludes a CT angiogram. Again, he is better with diuresis. CURRENT MEDS    . albuterol  5 mg Nebulization Once  . amLODipine  10 mg Oral Daily  . ARIPiprazole  5 mg Oral Daily  . aspirin  81 mg Oral Daily  . docusate sodium  100 mg Oral BID  . furosemide  80 mg Intravenous Q12H  . heparin  5,000 Units Subcutaneous Q8H  . hydrALAZINE  100 mg Oral TID  . hydrOXYzine  50 mg Oral QHS  . ipratropium  0.5 mg Nebulization Once  . lisinopril  20 mg Oral Daily  . LORazepam  1 mg Intravenous Once  . methylPREDNISolone (SOLU-MEDROL) injection  125 mg Intravenous Once  . metoCLOPramide  10 mg Oral TID AC & HS  . metoprolol  75 mg Oral BID  . pantoprazole  40 mg Oral Q1200  . sodium chloride  3 mL Intravenous Q12H  . DISCONTD: amLODipine  10 mg Oral Daily    OBJECTIVE  Filed Vitals:   08/02/12 2300 08/02/12 2330 08/03/12 0023 08/03/12 0652  BP: 162/107  146/101 128/84    Pulse:   104 82  Temp:   97.6 F (36.4 C) 98.4 F (36.9 C)  TempSrc:   Oral Oral  Resp:  23 22 20   Height:   5\' 5"  (1.651 m)   Weight:   145 lb 1 oz (65.8 kg)   SpO2:   100% 100%    Intake/Output Summary (Last 24 hours) at 08/03/12 1043 Last data filed at 08/03/12 0900  Gross per 24 hour  Intake    360 ml  Output    750 ml  Net   -390 ml   Filed Weights   08/03/12 0023  Weight: 145 lb 1 oz (65.8 kg)    PHYSICAL EXAM  General: Pleasant, NAD. Neuro: Alert and oriented X 3. Moves all extremities spontaneously. Psych: Normal affect. HEENT:  Normal  Neck: Supple without bruits or JVD. Lungs:  Resp regular and unlabored, CTA. No rub. Heart: RRR no s3, s4, 3/6 systolic murmur Abdomen: Soft, non-tender, non-distended, BS + x 4.  Extremities: No clubbing, cyanosis or edema. DP/PT/Radials 2+ and equal bilaterally.  Accessory Clinical Findings  CBC  Basename 08/02/12 1858  WBC 7.2  NEUTROABS --  HGB 9.8*  HCT 29.6*  MCV 88.6  PLT 128*   Basic Metabolic Panel  Basename 08/03/12 0540 08/02/12 1858  NA 137 133*  K 4.0 3.8  CL 102 100  CO2 20 21  GLUCOSE 139* 104*  BUN 50* 43*  CREATININE 3.09* 3.04*  CALCIUM 9.8 9.1  MG -- --  PHOS -- --   Liver Function Tests No results found for this basename: AST:2,ALT:2,ALKPHOS:2,BILITOT:2,PROT:2,ALBUMIN:2 in the last 72 hours No results found for this basename: LIPASE:2,AMYLASE:2 in the last 72 hours Cardiac Enzymes No results found for this basename: CKTOTAL:3,CKMB:3,CKMBINDEX:3,TROPONINI:3 in the last 72 hours BNP No components found with this basename: POCBNP:3 D-Dimer  [facility] 08/02/12 1930  DDIMER 0.71*   Hemoglobin A1C No results found for this basename: HGBA1C in the last 72 hours Fasting Lipid Panel No results found for this basename: CHOL,HDL,LDLCALC,TRIG,CHOLHDL,LDLDIRECT in the last 72 hours Thyroid Function Tests No results found for this basename: TSH,T4TOTAL,FREET3,T3FREE,THYROIDAB in the last 72  hours  TELE  Normal sinus rhythm  ECG  Normal sinus rhythm, LVH Radiology/Studies  Dg Chest 2 View  08/02/2012  *RADIOLOGY REPORT*  Clinical Data: Chest pain.  Short of breath.  Congestive heart failure.  CHEST - 2 VIEW  Comparison: 06/04/2012  Findings: Artifact overlies chest.  There has been previous median sternotomy and mitral valve replacement.  The heart is enlarged. There may be venous hypertension but there is no frank edema.  No effusions.  No focal pulmonary abnormality.  IMPRESSION: Previous mitral valve replacement.  Cardiomegaly.  Possible venous hypertension without frank edema.   Original Report Authenticated By: Thomasenia Sales, M.D.     ASSESSMENT AND PLAN    Symptomatically he is better with some diuresis. I'm not over depressed with the real change in his clinical status based on his history. He needs to be more compliant with weighing daily which I reviewed with him at length. We'll have the heart failure teaching service work with him tomorrow. Plan discharge tomorrow after further intravenous diuresis. I would not workup the d-dimer.  Signed, Valera Castle MD

## 2012-08-04 ENCOUNTER — Encounter (HOSPITAL_COMMUNITY): Payer: Self-pay | Admitting: Nurse Practitioner

## 2012-08-04 ENCOUNTER — Encounter: Payer: Self-pay | Admitting: Cardiovascular Disease

## 2012-08-04 LAB — BASIC METABOLIC PANEL
BUN: 60 mg/dL — ABNORMAL HIGH (ref 6–23)
CO2: 22 mEq/L (ref 19–32)
Chloride: 105 mEq/L (ref 96–112)
Creatinine, Ser: 3.05 mg/dL — ABNORMAL HIGH (ref 0.50–1.35)
GFR calc Af Amer: 26 mL/min — ABNORMAL LOW (ref >90)
Potassium: 3.5 mEq/L (ref 3.5–5.1)

## 2012-08-04 MED ORDER — FUROSEMIDE 80 MG PO TABS: 80.0000 mg | ORAL_TABLET | Freq: Two times a day (BID) | ORAL | Status: DC

## 2012-08-04 MED ORDER — HYDRALAZINE HCL 100 MG PO TABS: 100.0000 mg | ORAL_TABLET | Freq: Three times a day (TID) | ORAL | Status: DC

## 2012-08-04 MED ORDER — FUROSEMIDE 80 MG PO TABS
80.0000 mg | ORAL_TABLET | Freq: Two times a day (BID) | ORAL | Status: DC
  Administered 2012-08-04: 80 mg via ORAL
  Filled 2012-08-04 (×3): qty 1

## 2012-08-04 MED ORDER — METOPROLOL TARTRATE 50 MG PO TABS: 75.0000 mg | ORAL_TABLET | Freq: Two times a day (BID) | ORAL | Status: DC

## 2012-08-04 NOTE — Progress Notes (Signed)
All d/c instructions explained and given to pt and verbalized understanding.  D/C home via w/c.  Amanda Pea, RN

## 2012-08-04 NOTE — Progress Notes (Signed)
PROGRESS NOTE  Subjective:   Mr. Voit is a 51 yo with hx of diastolic CHF ( restrictive physiology) , CKD, HTN, s/p MV repair,   The patient was admitted and is doing well.  He was up walking labs on the floor when I arrived on the floor to see him.  he denies any chest discomfort or dyspnea.  He is back to normal.   Objective:    Vital Signs:   Temp:  [97.9 F (36.6 C)-98.3 F (36.8 C)] 98.1 F (36.7 C) (09/16 0621) Pulse Rate:  [78-83] 78  (09/16 0621) Resp:  [18-20] 18  (09/16 0621) BP: (108-150)/(61-134) 108/78 mmHg (09/16 0621) SpO2:  [98 %-100 %] 98 % (09/16 0621) Weight:  [145 lb 15.1 oz (66.2 kg)] 145 lb 15.1 oz (66.2 kg) (09/16 0621)  Last BM Date: 08/03/12   24-hour weight change: Weight change: 14.1 oz (0.4 kg)  Weight trends: Filed Weights   08/03/12 0023 08/04/12 0621  Weight: 145 lb 1 oz (65.8 kg) 145 lb 15.1 oz (66.2 kg)    Intake/Output:  09/15 0701 - 09/16 0700 In: 1560 [P.O.:1560] Out: 900 [Urine:900]     Physical Exam: BP 108/78  Pulse 78  Temp 98.1 F (36.7 C) (Oral)  Resp 18  Ht 5\' 5"  (1.651 m)  Wt 145 lb 15.1 oz (66.2 kg)  BMI 24.29 kg/m2  SpO2 98%  General: Vital signs reviewed and noted. Well-developed, well-nourished, in no acute distress; alert, appropriate and cooperative . muscular  Head: Normocephalic, atraumatic.  Eyes: conjunctivae/corneas clear.  EOM's intact.   Throat: normal  Neck: Supple. Normal carotids. No JVD  Lungs:  Clear to auscultation  Heart: Regular rate,  With normal  S1 S2. 2-3/6 systolic murmur radiating to the axilla  Abdomen:  Soft, non-tender, non-distended with normoactive bowel sounds. No hepatomegaly. No rebound/guarding. No abdominal masses.  Extremities: Distal pedal pulses are 2+ .  No edema.    Neurologic: A&O X3, CN II - XII are grossly intact. Motor strength is 5/5 in the all 4 extremities.  Psych: Responds to questions appropriately with normal affect.    Labs: BMET:  Basename 08/04/12  0545 08/03/12 0540  NA 140 137  K 3.5 4.0  CL 105 102  CO2 22 20  GLUCOSE 96 139*  BUN 60* 50*  CREATININE 3.05* 3.09*  CALCIUM 9.2 9.8  MG -- --  PHOS -- --    Liver function tests: No results found for this basename: AST:2,ALT:2,ALKPHOS:2,BILITOT:2,PROT:2,ALBUMIN:2 in the last 72 hours No results found for this basename: LIPASE:2,AMYLASE:2 in the last 72 hours  CBC:  Basename 08/02/12 1858  WBC 7.2  NEUTROABS --  HGB 9.8*  HCT 29.6*  MCV 88.6  PLT 128*    Cardiac Enzymes: No results found for this basename: CKTOTAL:4,CKMB:4,TROPONINI:4 in the last 72 hours  Coagulation Studies: No results found for this basename: LABPROT:5,INR:5 in the last 72 hours  Other: No components found with this basename: POCBNP:3  Basename 08/02/12 1930  DDIMER 0.71*    Tele:  NSR  Medications:    Infusions:    Scheduled Medications:    . amLODipine  10 mg Oral Daily  . ARIPiprazole  5 mg Oral Daily  . aspirin  81 mg Oral Daily  . docusate sodium  100 mg Oral BID  . furosemide  80 mg Intravenous Q12H  . heparin  5,000 Units Subcutaneous Q8H  . hydrALAZINE  100 mg Oral TID  . hydrOXYzine  50 mg Oral QHS  .  lisinopril  20 mg Oral Daily  . metoCLOPramide  10 mg Oral TID AC & HS  . metoprolol  75 mg Oral BID  . pantoprazole  40 mg Oral Q1200  . sodium chloride  3 mL Intravenous Q12H  . DISCONTD: furosemide  80 mg Intravenous Q12H    Assessment/ Plan:    1. Dyspnea:  He has improved dramatically.  His I/Os do not support any significant diuresis but he is up walking up and down the halls without any difficulty.  He may have not saved all of his urine.  OK to DC to home today.  He had some salty foods this weekend that may have tipped him into mild CHF.  He has a normal LVEF and has restrictive physiology.     His murmur is c/w persistent MR.  Will do echo as OP for further evaluation.  He will see Dr. Excell Seltzer or PA in the next week or so.  2. CKD:  He will likely need  to see a nephrologist at some point.  He says that he has seen one in the past.      Disposition: dc to home today Length of Stay: 2  Vesta Mixer, Montez Hageman., MD, [facility] 08/04/2012, 8:01 AM Office [phone] Pager [phone]

## 2012-08-04 NOTE — Care Management Note (Signed)
    Page 1 of 1   08/04/2012     10:57:54 AM   CARE MANAGEMENT NOTE 08/04/2012  Patient:  Ryan Winters, Ryan Winters   Account Number:  [id number]  Date Initiated:  08/04/2012  Documentation initiated by:  Tera Mater  Subjective/Objective Assessment:   51yo male admitted with CHF.  Pt. lives at home with parents.     Action/Plan:   Discharge planning for [facility]   Anticipated DC Date:  08/04/2012   Anticipated DC Plan:  HOME W HOME HEALTH SERVICES      DC Planning Services  CM consult      [facility] Choice  HOME HEALTH   Choice offered to / List presented to:          [facility] arranged  HH-1 RN  HH-10 DISEASE MANAGEMENT      HH agency  [facility]   Status of service:  Completed, signed off Medicare Important Message given?   (If response is "NO", the following Medicare IM given date fields will be blank) Date Medicare IM given:   Date Additional Medicare IM given:    Discharge Disposition:    Per UR Regulation:  Reviewed for med. necessity/level of care/duration of stay  If discussed at Long Length of Stay Meetings, dates discussed:    Comments:  08/04/12 1045 Tera Mater, RN, BSN NCM [phone] In to complete HF Screen.  Pt. stated he had used [facility] in past and was satisfied with their services. TC to Hilda Lias, with [facility] to give referral for [facility] RN for HF management.  Pt. will be dc home today with mother.

## 2012-08-04 NOTE — Discharge Summary (Signed)
Patient ID: Ryan Winters,  MRN: [id number], DOB/AGE: 51-Mar-1962 51 y.o.  Admit date: 08/02/2012 Discharge date: 08/04/2012  Primary Care Provider: OSEI-BONSU,GEORGE Primary Cardiologist: M. Excell Seltzer, MD  Discharge Diagnoses Principal Problem:  *Acute on chronic diastolic CHF (congestive heart failure), NYHA class 3  **D/C weight of 145lbs (no significant change from admission weight) Active Problems:  HTN (hypertension), malignant  CKD (chronic kidney disease), stage IV  Moderate Mitral valvular regurgitation  **S/P MV repair 2011  GERD (gastroesophageal reflux disease)  Allergies Allergies  Allergen Reactions  . Cashew Nut Oil Anaphylaxis  . Food Anaphylaxis    Strawberries, Cashews, causes throat to swell - affects breathing   Procedures  None  History of Present Illness  51 y/o male with h/o MR s/p MVR in 2011 along with diastolic CHF who was in his USOH until approximately 2 days prior to admission when he began to note increasing DOE.  He presented to the ED on 9/14 where CXR showed possible venous hypertension w/o frank edema and pBNP was elevated @ 2258.  He was felt to be volume overloaded on exam and he was admitted for further evaluation and diuresis.  Hospital Course  Pt was placed on IV lasix with symptomatic improvement.  His weight has remained 145 lbs despite diuresis and I/O actually shows a net positive of 120 mL for this admission.  It is believed that pt was not saving all of his urine.  He has been counseled on the importance of daily weights, medication compliance, sodium avoidance, and symptom reporting.  He will be discharged home today in good condition.  We have arranged for f/u 2D echocardiogram next week in our office at the time of his f/u appt.  Of note, pts d-dimer was elevated @ 0.71.  As symptoms improved with diuresis and he had no other clinical evidence of possible PE, we have not pursued additional evaluation to r/o PE.  Discharge  Vitals Blood pressure 108/68, pulse 83, temperature 98.1 F (36.7 C), temperature source Oral, resp. rate 18, height 5\' 5"  (1.651 m), weight 145 lb 15.1 oz (66.2 kg), SpO2 98.00%.  Filed Weights   08/03/12 0023 08/04/12 0621  Weight: 145 lb 1 oz (65.8 kg) 145 lb 15.1 oz (66.2 kg)   Labs  CBC  Basename 08/02/12 1858  WBC 7.2  NEUTROABS --  HGB 9.8*  HCT 29.6*  MCV 88.6  PLT 128*   Basic Metabolic Panel  Basename 08/04/12 0545 08/03/12 0540  NA 140 137  K 3.5 4.0  CL 105 102  CO2 22 20  GLUCOSE 96 139*  BUN 60* 50*  CREATININE 3.05* 3.09*  CALCIUM 9.2 9.8  MG -- --  PHOS -- --   D-Dimer  Basename 08/02/12 1930  DDIMER 0.71*   Disposition  Pt is being discharged home today in good condition.  Follow-up Plans & Appointments  Follow-up Information    Follow up with Nicolasa Ducking, NP. On 08/12/2012. (Dr. Earmon Phoenix Nurse Practitioner - 12:00 PM;  Echocardiogram @ 1:00 PM)    Contact information:   [address] [place] [address] [phone]       Follow up with Jackie Plum, MD. (as scheduled)    Contact information:   [address], [address] [place] [address] [phone]        Discharge Medications    Medication List     As of 08/04/2012  9:34 AM    STOP taking these medications  multivitamins ther. w/minerals Tabs      promethazine 25 MG tablet   Commonly known as: PHENERGAN      TAKE these medications         ABILIFY 5 MG tablet   Generic drug: ARIPiprazole   Take 5 mg by mouth daily.      albuterol 108 (90 BASE) MCG/ACT inhaler   Commonly known as: PROVENTIL HFA;VENTOLIN HFA   Inhale 2 puffs into the lungs every 4 (four) hours as needed.      amLODipine 10 MG tablet   Commonly known as: NORVASC   Take 10 mg by mouth daily.      aspirin 81 MG EC tablet   Take 81 mg by mouth daily.      docusate sodium 100 MG capsule   Commonly known as: COLACE   Take 100 mg by mouth 2 (two) times  daily.      furosemide 80 MG tablet   Commonly known as: LASIX   Take 1 tablet (80 mg total) by mouth 2 (two) times daily.      hydrALAZINE 100 MG tablet   Commonly known as: APRESOLINE   Take 1 tablet (100 mg total) by mouth 3 (three) times daily.      hydrOXYzine 50 MG capsule   Commonly known as: VISTARIL   Take 50 mg by mouth at bedtime.      lisinopril 20 MG tablet   Commonly known as: PRINIVIL,ZESTRIL   Take 20 mg by mouth daily.      metoCLOPramide 10 MG tablet   Commonly known as: REGLAN   Take 10 mg by mouth 4 (four) times daily -  before meals and at bedtime.      metoprolol 50 MG tablet   Commonly known as: LOPRESSOR   Take 1.5 tablets (75 mg total) by mouth 2 (two) times daily.      omeprazole 40 MG capsule   Commonly known as: PRILOSEC   Take 40 mg by mouth 2 (two) times daily.      Outstanding Labs/Studies  2D echo scheduled for next week.  Duration of Discharge Encounter   Greater than 30 minutes including physician time.  Signed, Nicolasa Ducking NP 08/04/2012, 9:34 AM   Attending note:  Please see my note from earlier today.  He is completely asymptomatic.  He has made multiple laps up and down the hallway without any problems.   Will Dc to home in stable condition.    Vesta Mixer, Montez Hageman., MD, [facility] office [phone] Pager [phone]

## 2012-08-06 ENCOUNTER — Telehealth: Payer: Self-pay | Admitting: Cardiovascular Disease

## 2012-08-06 NOTE — Telephone Encounter (Signed)
New problem:   Did a start of care visit today. Nurse shown  patient the contract  patient refused to sign stating he didn't not need the services.

## 2012-08-06 NOTE — Telephone Encounter (Signed)
I spoke with the pt and he does not want home health care at this time.  The pt said he does have assistance at this time (friends and family) and he does not want his Medicaid used toward home health when he feels he does not need this assistance. I spoke with Dois Davenport and made her aware that I have spoken with the pt and confirmed that he does not want home health care.

## 2012-08-11 ENCOUNTER — Other Ambulatory Visit (HOSPITAL_COMMUNITY): Payer: Self-pay | Admitting: Radiology

## 2012-08-11 DIAGNOSIS — I509 Heart failure, unspecified: Secondary | ICD-10-CM

## 2012-08-12 ENCOUNTER — Other Ambulatory Visit (HOSPITAL_COMMUNITY): Payer: Medicaid Other

## 2012-08-12 ENCOUNTER — Ambulatory Visit: Payer: Medicaid Other | Admitting: Nurse Practitioner

## 2012-08-20 ENCOUNTER — Ambulatory Visit (INDEPENDENT_AMBULATORY_CARE_PROVIDER_SITE_OTHER): Payer: Medicaid Other | Admitting: Physician Assistant

## 2012-08-20 ENCOUNTER — Ambulatory Visit (HOSPITAL_COMMUNITY): Payer: Medicaid Other | Attending: Cardiovascular Disease

## 2012-08-20 ENCOUNTER — Encounter: Payer: Self-pay | Admitting: Physician Assistant

## 2012-08-20 VITALS — BP 120/90 | Ht 66.0 in | Wt 145.0 lb

## 2012-08-20 DIAGNOSIS — I059 Rheumatic mitral valve disease, unspecified: Secondary | ICD-10-CM

## 2012-08-20 DIAGNOSIS — N189 Chronic kidney disease, unspecified: Secondary | ICD-10-CM | POA: Insufficient documentation

## 2012-08-20 DIAGNOSIS — I340 Nonrheumatic mitral (valve) insufficiency: Secondary | ICD-10-CM

## 2012-08-20 DIAGNOSIS — I369 Nonrheumatic tricuspid valve disorder, unspecified: Secondary | ICD-10-CM | POA: Insufficient documentation

## 2012-08-20 DIAGNOSIS — I509 Heart failure, unspecified: Secondary | ICD-10-CM | POA: Insufficient documentation

## 2012-08-20 DIAGNOSIS — I2789 Other specified pulmonary heart diseases: Secondary | ICD-10-CM

## 2012-08-20 DIAGNOSIS — F209 Schizophrenia, unspecified: Secondary | ICD-10-CM | POA: Insufficient documentation

## 2012-08-20 DIAGNOSIS — I071 Rheumatic tricuspid insufficiency: Secondary | ICD-10-CM

## 2012-08-20 DIAGNOSIS — I079 Rheumatic tricuspid valve disease, unspecified: Secondary | ICD-10-CM

## 2012-08-20 DIAGNOSIS — I272 Pulmonary hypertension, unspecified: Secondary | ICD-10-CM

## 2012-08-20 DIAGNOSIS — I5032 Chronic diastolic (congestive) heart failure: Secondary | ICD-10-CM

## 2012-08-20 DIAGNOSIS — I129 Hypertensive chronic kidney disease with stage 1 through stage 4 chronic kidney disease, or unspecified chronic kidney disease: Secondary | ICD-10-CM | POA: Insufficient documentation

## 2012-08-20 DIAGNOSIS — I10 Essential (primary) hypertension: Secondary | ICD-10-CM

## 2012-08-20 NOTE — Progress Notes (Signed)
Echocardiogram performed.  

## 2012-08-20 NOTE — Patient Instructions (Addendum)
Your physician has requested that you have a TEE. During a TEE, sound waves are used to create images of your heart. It provides your doctor with information about the size and shape of your heart and how well your heart's chambers and valves are working. In this test, a transducer is attached to the end of a flexible tube that's guided down your throat and into your esophagus (the tube leading from you mouth to your stomach) to get a more detailed image of your heart. You are not awake for the procedure. Please see the instruction sheet given to you today. For further information please visit [email or web address]   Your physician recommends that you return for lab work in: TODAY BMET, CBC W/DIFF, PT/INR

## 2012-08-20 NOTE — Progress Notes (Signed)
[address] [place], [place]  [address] Phone: [phone] Fax:  [phone]  Date:  08/20/2012   Name:  Ryan Winters   DOB:  1961-10-17   MRN:  [id number]  PCP:  Dorrene German, MD  Primary Cardiologist:  Dr. Tonny Bollman  Primary Electrophysiologist:  None    History of Present Illness: Ryan Winters is a 51 y.o. male who returns for post hospital follow up.  He has a hx of diastolic CHF, mitral regurgitation, s/p MV repair in 2011, stage IV CKD, HTN, polysubstance abuse.  He had no CAD at [facility] in 7/11 prior to his valve repair.  Echo 6/13:  Mild LVH, EF 55-60%, mod MR, mod to severe LAE, mild RVE, mod to sever RAE, severe TR, ? Vegatation, PASP 43.  Admitted 9/14-9/16 with a/c diastolic CHF.  Improved with IV diruesis.  OP echo was arranged.  Echo done today.  Patient notes stable symptoms.  He states he has slept sitting up since his prior valve surgery.  Also notes frequent PND.  Since d/c, no increased weight or LE edema.  No chest pain.  No syncope.  He is clear to me today that he is not smoking, drinking ETOH or using cocaine.    Labs (9/13):  K 3.5, creatinine 3.05, Hgb 9.8   Wt Readings from Last 3 Encounters:  08/20/12 145 lb (65.772 kg)  08/04/12 145 lb 15.1 oz (66.2 kg)  07/15/12 149 lb 12.8 oz (67.949 kg)     Past Medical History  Diagnosis Date  . Hypertension   . Bipolar disorder   . Schizophrenia     h/o hospitalized inpt therapy @ the [facility]  . 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   Marland Kitchen Hyperlipidemia   . H/O: suicide attempt 1998    was hospitalized at [facility]  . Anemia     Hx of iron infusions  . Chronic diastolic heart failure     Hx of Class IV CHF  . Gastroparesis   . Generalized headaches     sinus  . 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 Arthritis   . Cocaine abuse     Current Outpatient Prescriptions  Medication Sig Dispense Refill  . albuterol (PROVENTIL HFA;VENTOLIN HFA) 108 (90 BASE) MCG/ACT inhaler Inhale 2 puffs into the lungs every 4 (four) hours as needed.      Marland Kitchen amLODipine (NORVASC) 10 MG tablet Take 10 mg by mouth daily.      . ARIPiprazole (ABILIFY) 5 MG tablet Take 5 mg by mouth daily.        Marland Kitchen aspirin 81 MG EC tablet Take 81 mg by mouth daily.        Marland Kitchen docusate sodium (COLACE) 100 MG capsule Take 100 mg by mouth 2 (two) times daily.      . furosemide (LASIX) 80 MG tablet Take 1 tablet (80 mg total) by mouth 2 (two) times daily.  60 tablet  6  . hydrALAZINE (APRESOLINE) 100 MG tablet Take 1 tablet (100 mg total) by mouth 3 (three) times daily.  90 tablet  6  . hydrOXYzine (VISTARIL) 50 MG capsule Take 50 mg by mouth at bedtime.       Marland Kitchen lisinopril (PRINIVIL,ZESTRIL) 20 MG tablet Take 20 mg  by mouth daily.      . metoCLOPramide (REGLAN) 10 MG tablet Take 10 mg by mouth 4 (four) times daily -  before meals and at bedtime.      . metoprolol (LOPRESSOR) 50 MG tablet Take 1.5 tablets (75 mg total) by mouth 2 (two) times daily.  90 tablet  6  . omeprazole (PRILOSEC) 40 MG capsule Take 40 mg by mouth 2 (two) times daily.        Allergies: Allergies  Allergen Reactions  . Cashew Nut Oil Anaphylaxis  . Food Anaphylaxis    Strawberries, Cashews, causes throat to swell - affects breathing    History  Substance Use Topics  . Smoking status: Former Smoker -- 30 years    Types: Cigarettes    Quit date: 03/21/2011  . Smokeless tobacco: Never Used  . Alcohol Use: No     Quit alcohol in July of 2011     ROS:  Please see the history of present illness.   He notes chills.  No melena, hematochezia.  All other systems reviewed and negative.   PHYSICAL EXAM: VS:  BP 120/90  Ht 5\' 6"  (1.676 m)  Wt 145 lb (65.772 kg)  BMI 23.40 kg/m2 Well nourished, well developed, in no acute distress HEENT:  normal Neck: no JVD Cardiac:  normal S1, S2; RRR; 3/6 holosystolic murmur along LLSB radiating to apex2 Lungs:  clear to auscultation bilaterally, no wheezing, rhonchi or rales Abd: soft, nontender, no hepatomegaly Ext: no edema Skin: warm and dry Neuro:  CNs 2-12 intact, no focal abnormalities noted  EKG:  NSR, HR 99, LVH, NSSTTW changes      ASSESSMENT AND PLAN:  1. Mitral Regurgitation:  I reviewed his echo today with Dr. Delane Ginger.  He also look at his prior echos.  His MR is worse and rated as severe.  He has severe TR as well as mod to severe pulmo HTN.  There is also a suggestion of MS although the gradient calculation does not seem accurate.  Patient admits to being clean from drugs for several mos.  His symptoms, although stable at this time, are severe.  I reviewed with Dr. Tonny Bollman.  He contacted Dr. Donata Clay by phone.  Will arrange TEE.  Refer to Dr. Donata Clay for consideration of MV replacement.    2.  Chronic Diastolic CHF:  In context of valvular heart disease.  BP is ok.  Volume is stable.  Continue current Rx.   3. Hypertension:  Controlled.  Continue current therapy.   4. Substance Abuse:  He has done a good job staying off drugs for several mos now.   Luna Glasgow, PA-C  3:39 PM 08/20/2012

## 2012-08-21 ENCOUNTER — Encounter: Payer: Self-pay | Admitting: *Deleted

## 2012-08-21 ENCOUNTER — Telehealth: Payer: Self-pay | Admitting: *Deleted

## 2012-08-21 LAB — CBC WITH DIFFERENTIAL/PLATELET
Basophils Relative: 1.2 % (ref 0.0–3.0)
Eosinophils Absolute: 2.2 10*3/uL — ABNORMAL HIGH (ref 0.0–0.7)
Hemoglobin: 10.7 g/dL — ABNORMAL LOW (ref 13.0–17.0)
MCHC: 32.9 g/dL (ref 30.0–36.0)
MCV: 91.4 fl (ref 78.0–100.0)
Monocytes Absolute: 0.9 10*3/uL (ref 0.1–1.0)
Neutro Abs: 3.9 10*3/uL (ref 1.4–7.7)
Neutrophils Relative %: 48.7 % (ref 43.0–77.0)
RBC: 3.55 Mil/uL — ABNORMAL LOW (ref 4.22–5.81)

## 2012-08-21 LAB — BASIC METABOLIC PANEL
CO2: 27 mEq/L (ref 19–32)
Chloride: 103 mEq/L (ref 96–112)
Creatinine, Ser: 4.2 mg/dL — ABNORMAL HIGH (ref 0.4–1.5)

## 2012-08-21 NOTE — Telephone Encounter (Signed)
pt notified about TEE 08/25/12 @ 11am @ [facility] short stay, pt gave verbal understanding today. I will mail out instructions today

## 2012-08-22 ENCOUNTER — Telehealth: Payer: Self-pay | Admitting: *Deleted

## 2012-08-22 DIAGNOSIS — I5033 Acute on chronic diastolic (congestive) heart failure: Secondary | ICD-10-CM

## 2012-08-22 NOTE — Telephone Encounter (Signed)
Message copied by Tarri Fuller on Fri Aug 22, 2012  9:49 AM ------      Message from: [place], [place] T      Created: Fri Aug 22, 2012  8:33 AM       Hgb stable.      Creatinine creeping up.      No change in meds at this time.      Repeat BMET Monday 08/25/12      Tereso Newcomer, PA-C  8:33 AM 08/22/2012

## 2012-08-22 NOTE — H&P (Signed)
History and Physical   Date: 08/20/2012   Name: Ryan Winters  DOB: [birth year]  MRN: [id number]   PCP: Dorrene German, MD  Primary Cardiologist: Dr. Tonny Bollman  Primary Electrophysiologist: None   History of Present Illness:  Ryan Winters is a 52 y.o. male who returns for post hospital follow up.  He has a hx of diastolic CHF, mitral regurgitation, s/p MV repair in 2011, stage IV CKD, HTN, polysubstance abuse. He had no CAD at [facility] in 7/11 prior to his valve repair. Echo 6/13: Mild LVH, EF 55-60%, mod MR, mod to severe LAE, mild RVE, mod to sever RAE, severe TR, ? Vegatation, PASP 43. Admitted 9/14-9/16 with a/c diastolic CHF. Improved with IV diruesis. OP echo was arranged. Echo done today. Patient notes stable symptoms. He states he has slept sitting up since his prior valve surgery. Also notes frequent PND. Since d/c, no increased weight or LE edema. No chest pain. No syncope. He is clear to me today that he is not smoking, drinking ETOH or using cocaine.    Labs (9/13): K 3.5, creatinine 3.05, Hgb 9.8    Wt Readings from Last 3 Encounters:   08/20/12  145 lb (65.772 kg)   08/04/12  145 lb 15.1 oz (66.2 kg)   07/15/12  149 lb 12.8 oz (67.949 kg)     Past Medical History   Diagnosis  Date   .  Hypertension    .  Bipolar disorder    .  Schizophrenia      h/o hospitalized inpt therapy @ the [facility]   .  MVP (mitral valve prolapse)      with severe MR s/p repair with annuloplasty and cleft MV repair in 09/2010, last echo June 2013 showed EF 55-60%; restrictive physiology; mod MR, severe TR.   Marland Kitchen  Hyperlipidemia    .  H/O: suicide attempt  1998     was hospitalized at [facility]   .  Anemia      Hx of iron infusions   .  Chronic diastolic heart failure      Hx of Class IV CHF   .  Gastroparesis    .  Generalized headaches      sinus   .  Chronic kidney disease      has fistula, not on HD   .  Arthritis    .  Cocaine abuse      Current  Outpatient Prescriptions   Medication  Sig  Dispense  Refill   .  albuterol (PROVENTIL HFA;VENTOLIN HFA) 108 (90 BASE) MCG/ACT inhaler  Inhale 2 puffs into the lungs every 4 (four) hours as needed.     Marland Kitchen  amLODipine (NORVASC) 10 MG tablet  Take 10 mg by mouth daily.     .  ARIPiprazole (ABILIFY) 5 MG tablet  Take 5 mg by mouth daily.     Marland Kitchen  aspirin 81 MG EC tablet  Take 81 mg by mouth daily.     Marland Kitchen  docusate sodium (COLACE) 100 MG capsule  Take 100 mg by mouth 2 (two) times daily.     .  furosemide (LASIX) 80 MG tablet  Take 1 tablet (80 mg total) by mouth 2 (two) times daily.  60 tablet  6   .  hydrALAZINE (APRESOLINE) 100 MG tablet  Take 1 tablet (100 mg total) by mouth 3 (three) times daily.  90 tablet  6   .  hydrOXYzine (VISTARIL) 50 MG capsule  Take  50 mg by mouth at bedtime.     Marland Kitchen  lisinopril (PRINIVIL,ZESTRIL) 20 MG tablet  Take 20 mg by mouth daily.     .  metoCLOPramide (REGLAN) 10 MG tablet  Take 10 mg by mouth 4 (four) times daily - before meals and at bedtime.     .  metoprolol (LOPRESSOR) 50 MG tablet  Take 1.5 tablets (75 mg total) by mouth 2 (two) times daily.  90 tablet  6   .  omeprazole (PRILOSEC) 40 MG capsule  Take 40 mg by mouth 2 (two) times daily.     Allergies:  Allergies   Allergen  Reactions   .  Cashew Nut Oil  Anaphylaxis   .  Food  Anaphylaxis     Strawberries, Cashews, causes throat to swell - affects breathing     History   Substance Use Topics   .  Smoking status:  Former Smoker -- 30 years     Types:  Cigarettes     Quit date:  03/21/2011   .  Smokeless tobacco:  Never Used   .  Alcohol Use:  No      Quit alcohol in July of 2011     ROS: Please see the history of present illness. He notes chills. No melena, hematochezia. All other systems reviewed and negative.    PHYSICAL EXAM:  VS: BP 120/90  Ht 5\' 6"  (1.676 m)  Wt 145 lb (65.772 kg)  BMI 23.40 kg/m2  Well nourished, well developed, in no acute distress  HEENT: normal  Neck: no JVD    Cardiac: normal S1, S2; RRR; 3/6 holosystolic murmur along LLSB radiating to apex2  Lungs: clear to auscultation bilaterally, no wheezing, rhonchi or rales  Abd: soft, nontender, no hepatomegaly  Ext: no edema  Skin: warm and dry  Neuro: CNs 2-12 intact, no focal abnormalities noted    EKG: NSR, HR 99, LVH, NSSTTW changes    ASSESSMENT AND PLAN:   1. Mitral Regurgitation: I reviewed his echo today with Dr. Delane Ginger. He also look at his prior echos. His MR is worse and rated as severe. He has severe TR as well as mod to severe pulmo HTN. There is also a suggestion of MS although the gradient calculation does not seem accurate. Patient admits to being clean from drugs for several mos. His symptoms, although stable at this time, are severe. I reviewed with Dr. Tonny Bollman. He contacted Dr. Donata Clay by phone. Will arrange TEE. Refer to Dr. Donata Clay for consideration of MV replacement.   2. Chronic Diastolic CHF: In context of valvular heart disease. BP is ok. Volume is stable. Continue current Rx.   3. Hypertension: Controlled. Continue current therapy.   4. Substance Abuse: He has done a good job staying off drugs for several mos now.   Luna Glasgow, PA-C 3:39 PM 08/20/2012

## 2012-08-22 NOTE — Telephone Encounter (Signed)
pt notified about lab results w/verbal understanding today, pt having TEE 10/7 will see if he can have bmet 10/7 @ the hospital

## 2012-08-25 ENCOUNTER — Ambulatory Visit (HOSPITAL_COMMUNITY)
Admission: RE | Admit: 2012-08-25 | Discharge: 2012-08-25 | Disposition: A | Discharge status: Home / Self Care | Payer: Medicaid Other | Source: Ambulatory Visit | Attending: Cardiovascular Disease | Admitting: Cardiovascular Disease

## 2012-08-25 ENCOUNTER — Other Ambulatory Visit: Payer: Self-pay | Admitting: Cardiovascular Disease

## 2012-08-25 ENCOUNTER — Encounter (HOSPITAL_COMMUNITY): Payer: Self-pay | Admitting: Gastroenterology

## 2012-08-25 ENCOUNTER — Encounter (HOSPITAL_COMMUNITY)
Admission: RE | Disposition: A | Discharge status: Home / Self Care | Payer: Self-pay | Source: Ambulatory Visit | Attending: Cardiovascular Disease

## 2012-08-25 DIAGNOSIS — I059 Rheumatic mitral valve disease, unspecified: Secondary | ICD-10-CM | POA: Insufficient documentation

## 2012-08-25 DIAGNOSIS — I079 Rheumatic tricuspid valve disease, unspecified: Secondary | ICD-10-CM | POA: Insufficient documentation

## 2012-08-25 DIAGNOSIS — I2789 Other specified pulmonary heart diseases: Secondary | ICD-10-CM | POA: Insufficient documentation

## 2012-08-25 DIAGNOSIS — I5033 Acute on chronic diastolic (congestive) heart failure: Secondary | ICD-10-CM

## 2012-08-25 DIAGNOSIS — I129 Hypertensive chronic kidney disease with stage 1 through stage 4 chronic kidney disease, or unspecified chronic kidney disease: Secondary | ICD-10-CM | POA: Insufficient documentation

## 2012-08-25 DIAGNOSIS — N184 Chronic kidney disease, stage 4 (severe): Secondary | ICD-10-CM | POA: Insufficient documentation

## 2012-08-25 HISTORY — PX: TEE WITHOUT CARDIOVERSION: SHX5443

## 2012-08-25 HISTORY — DX: Anxiety disorder, unspecified: F41.9

## 2012-08-25 HISTORY — DX: Gastro-esophageal reflux disease without esophagitis: K21.9

## 2012-08-25 LAB — BASIC METABOLIC PANEL
BUN: 41 mg/dL — ABNORMAL HIGH (ref 6–23)
Calcium: 8.8 mg/dL (ref 8.4–10.5)
GFR calc non Af Amer: 23 mL/min — ABNORMAL LOW (ref >90)
Glucose, Bld: 91 mg/dL (ref 70–99)
Sodium: 138 mEq/L (ref 135–145)

## 2012-08-25 LAB — CBC
Hemoglobin: 9.7 g/dL — ABNORMAL LOW (ref 13.0–17.0)
MCH: 29 pg (ref 26.0–34.0)
MCHC: 32.9 g/dL (ref 30.0–36.0)

## 2012-08-25 LAB — PROTIME-INR: INR: 1.22 (ref 0.00–1.49)

## 2012-08-25 SURGERY — ECHOCARDIOGRAM, TRANSESOPHAGEAL
Anesthesia: Moderate Sedation

## 2012-08-25 MED ORDER — SODIUM CHLORIDE 0.9 % IV SOLN: INTRAVENOUS | Status: DC

## 2012-08-25 MED ORDER — FENTANYL CITRATE 0.05 MG/ML IJ SOLN
INTRAMUSCULAR | Status: AC
  Filled 2012-08-25: qty 2

## 2012-08-25 MED ORDER — FENTANYL CITRATE 0.05 MG/ML IJ SOLN
INTRAMUSCULAR | Status: DC | PRN
  Administered 2012-08-25: 50 ug via INTRAVENOUS

## 2012-08-25 MED ORDER — BUTAMBEN-TETRACAINE-BENZOCAINE 2-2-14 % EX AERO
INHALATION_SPRAY | CUTANEOUS | Status: DC | PRN
  Administered 2012-08-25: 2 via TOPICAL
  Administered 2012-08-25: 1 via TOPICAL

## 2012-08-25 MED ORDER — MIDAZOLAM HCL 5 MG/ML IJ SOLN
INTRAMUSCULAR | Status: AC
  Filled 2012-08-25: qty 2

## 2012-08-25 MED ORDER — MIDAZOLAM HCL 10 MG/2ML IJ SOLN
INTRAMUSCULAR | Status: DC | PRN
  Administered 2012-08-25: 1 mg via INTRAVENOUS
  Administered 2012-08-25: 3 mg via INTRAVENOUS
  Administered 2012-08-25: 2 mg via INTRAVENOUS

## 2012-08-25 NOTE — CV Procedure (Signed)
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  Will need right and left heart cath Thursday with Dr Excell Seltzer and referral  To Dr Donata Clay for redo MVR and TV annuloplasty ring  [name]

## 2012-08-25 NOTE — H&P (View-Only) (Signed)
[address] [place], [place]  [address] Phone: [phone] Fax:  [phone]  Date:  08/20/2012   Name:  Ryan Winters   DOB:  03/07/1961   MRN:  [id number]  PCP:  AVBUERE,EDWIN A, MD  Primary Cardiologist:  Dr. Michael Cooper  Primary Electrophysiologist:  None    History of Present Illness: Ryan Winters is a 51 y.o. male who returns for post hospital follow up.  He has a hx of diastolic CHF, mitral regurgitation, s/p MV repair in 2011, stage IV CKD, HTN, polysubstance abuse.  He had no CAD at [facility] in 7/11 prior to his valve repair.  Echo 6/13:  Mild LVH, EF 55-60%, mod MR, mod to severe LAE, mild RVE, mod to sever RAE, severe TR, ? Vegatation, PASP 43.  Admitted 9/14-9/16 with a/c diastolic CHF.  Improved with IV diruesis.  OP echo was arranged.  Echo done today.  Patient notes stable symptoms.  He states he has slept sitting up since his prior valve surgery.  Also notes frequent PND.  Since d/c, no increased weight or LE edema.  No chest pain.  No syncope.  He is clear to me today that he is not smoking, drinking ETOH or using cocaine.    Labs (9/13):  K 3.5, creatinine 3.05, Hgb 9.8   Wt Readings from Last 3 Encounters:  08/20/12 145 lb (65.772 kg)  08/04/12 145 lb 15.1 oz (66.2 kg)  07/15/12 149 lb 12.8 oz (67.949 kg)     Past Medical History  Diagnosis Date  . Hypertension   . Bipolar disorder   . Schizophrenia     h/o hospitalized inpt therapy @ [facility]  . 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   . Hyperlipidemia   . H/O: suicide attempt 1998    was hospitalized at [name]  . Anemia     Hx of iron infusions  . Chronic diastolic heart failure     Hx of Class IV CHF  . Gastroparesis   . Generalized headaches     sinus  . 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 Arthritis   . Cocaine abuse     Current Outpatient Prescriptions  Medication Sig Dispense Refill  . albuterol (PROVENTIL HFA;VENTOLIN HFA) 108 (90 BASE) MCG/ACT inhaler Inhale 2 puffs into the lungs every 4 (four) hours as needed.      . amLODipine (NORVASC) 10 MG tablet Take 10 mg by mouth daily.      . ARIPiprazole (ABILIFY) 5 MG tablet Take 5 mg by mouth daily.        . aspirin 81 MG EC tablet Take 81 mg by mouth daily.        . docusate sodium (COLACE) 100 MG capsule Take 100 mg by mouth 2 (two) times daily.      . furosemide (LASIX) 80 MG tablet Take 1 tablet (80 mg total) by mouth 2 (two) times daily.  60 tablet  6  . hydrALAZINE (APRESOLINE) 100 MG tablet Take 1 tablet (100 mg total) by mouth 3 (three) times daily.  90 tablet  6  . hydrOXYzine (VISTARIL) 50 MG capsule Take 50 mg by mouth at bedtime.       . lisinopril (PRINIVIL,ZESTRIL) 20 MG tablet Take 20 mg   by mouth daily.      . metoCLOPramide (REGLAN) 10 MG tablet Take 10 mg by mouth 4 (four) times daily -  before meals and at bedtime.      . metoprolol (LOPRESSOR) 50 MG tablet Take 1.5 tablets (75 mg total) by mouth 2 (two) times daily.  90 tablet  6  . omeprazole (PRILOSEC) 40 MG capsule Take 40 mg by mouth 2 (two) times daily.        Allergies: Allergies  Allergen Reactions  . Cashew Nut Oil Anaphylaxis  . Food Anaphylaxis    Strawberries, Cashews, causes throat to swell - affects breathing    History  Substance Use Topics  . Smoking status: Former Smoker -- 30 years    Types: Cigarettes    Quit date: 03/21/2011  . Smokeless tobacco: Never Used  . Alcohol Use: No     Quit alcohol in July of 2011     ROS:  Please see the history of present illness.   He notes chills.  No melena, hematochezia.  All other systems reviewed and negative.   PHYSICAL EXAM: VS:  BP 120/90  Ht 5' 6" (1.676 m)  Wt 145 lb (65.772 kg)  BMI 23.40 kg/m2 Well nourished, well developed, in no acute distress HEENT:  normal Neck: no JVD Cardiac:  normal S1, S2; RRR; 3/6 holosystolic murmur along LLSB radiating to apex2 Lungs:  clear to auscultation bilaterally, no wheezing, rhonchi or rales Abd: soft, nontender, no hepatomegaly Ext: no edema Skin: warm and dry Neuro:  CNs 2-12 intact, no focal abnormalities noted  EKG:  NSR, HR 99, LVH, NSSTTW changes      ASSESSMENT AND PLAN:  1. Mitral Regurgitation:  I reviewed his echo today with Dr. Phil Nahser.  He also look at his prior echos.  His MR is worse and rated as severe.  He has severe TR as well as mod to severe pulmo HTN.  There is also a suggestion of MS although the gradient calculation does not seem accurate.  Patient admits to being clean from drugs for several mos.  His symptoms, although stable at this time, are severe.  I reviewed with Dr. Michael Cooper.  He contacted Dr. Van Trigt by phone.  Will arrange TEE.  Refer to Dr. Van Trigt for consideration of MV replacement.    2.  Chronic Diastolic CHF:  In context of valvular heart disease.  BP is ok.  Volume is stable.  Continue current Rx.   3. Hypertension:  Controlled.  Continue current therapy.   4. Substance Abuse:  He has done a good job staying off drugs for several mos now.   Signed, Scott Weaver, PA-C  3:39 PM 08/20/2012    

## 2012-08-25 NOTE — Interval H&P Note (Signed)
History and Physical Interval Note:  08/25/2012 11:33 AM  Ryan Winters  has presented today for surgery, with the diagnosis of severe mr and tr   The various methods of treatment have been discussed with the patient and family. After consideration of risks, benefits and other options for treatment, the patient has consented to  Procedure(s) (LRB) with comments: TRANSESOPHAGEAL ECHOCARDIOGRAM (TEE) (N/A) as a surgical intervention .  The patient's history has been reviewed, patient examined, no change in status, stable for surgery.  I have reviewed the patient's chart and labs.  Questions were answered to the patient's satisfaction.     [name]  Reviewed TTE  Severe MR and TR post repair in 2011 with pulmonary Hypertension  TEE and right and left heart cath needed before possible redo MVR and RV ring  [name]

## 2012-08-25 NOTE — Progress Notes (Signed)
*  PRELIMINARY RESULTS* Echocardiogram Echocardiogram Transesophageal has been performed.  Jeryl Columbia 08/25/2012, 1:36 PM

## 2012-08-26 ENCOUNTER — Encounter (HOSPITAL_COMMUNITY): Payer: Self-pay | Admitting: Pharmacy Technician

## 2012-08-26 ENCOUNTER — Encounter (HOSPITAL_COMMUNITY): Payer: Self-pay

## 2012-08-26 ENCOUNTER — Encounter (HOSPITAL_COMMUNITY): Payer: Self-pay | Admitting: Cardiovascular Disease

## 2012-08-28 ENCOUNTER — Ambulatory Visit (HOSPITAL_COMMUNITY)
Admission: RE | Admit: 2012-08-28 | Discharge: 2012-08-28 | Disposition: A | Discharge status: Home / Self Care | Payer: Medicaid Other | Source: Ambulatory Visit | Attending: Cardiovascular Disease | Admitting: Cardiovascular Disease

## 2012-08-28 ENCOUNTER — Encounter (HOSPITAL_COMMUNITY)
Admission: RE | Disposition: A | Discharge status: Home / Self Care | Payer: Self-pay | Source: Ambulatory Visit | Attending: Cardiovascular Disease

## 2012-08-28 DIAGNOSIS — I2510 Atherosclerotic heart disease of native coronary artery without angina pectoris: Secondary | ICD-10-CM | POA: Insufficient documentation

## 2012-08-28 DIAGNOSIS — I059 Rheumatic mitral valve disease, unspecified: Secondary | ICD-10-CM | POA: Insufficient documentation

## 2012-08-28 DIAGNOSIS — I509 Heart failure, unspecified: Secondary | ICD-10-CM

## 2012-08-28 DIAGNOSIS — F319 Bipolar disorder, unspecified: Secondary | ICD-10-CM | POA: Insufficient documentation

## 2012-08-28 DIAGNOSIS — N184 Chronic kidney disease, stage 4 (severe): Secondary | ICD-10-CM | POA: Insufficient documentation

## 2012-08-28 DIAGNOSIS — F209 Schizophrenia, unspecified: Secondary | ICD-10-CM | POA: Insufficient documentation

## 2012-08-28 DIAGNOSIS — F1410 Cocaine abuse, uncomplicated: Secondary | ICD-10-CM | POA: Insufficient documentation

## 2012-08-28 DIAGNOSIS — I5032 Chronic diastolic (congestive) heart failure: Secondary | ICD-10-CM | POA: Insufficient documentation

## 2012-08-28 DIAGNOSIS — I129 Hypertensive chronic kidney disease with stage 1 through stage 4 chronic kidney disease, or unspecified chronic kidney disease: Secondary | ICD-10-CM | POA: Insufficient documentation

## 2012-08-28 HISTORY — PX: LEFT AND RIGHT HEART CATHETERIZATION WITH CORONARY ANGIOGRAM: SHX5449

## 2012-08-28 LAB — POCT I-STAT 3, VENOUS BLOOD GAS (G3P V)
Acid-base deficit: 1 mmol/L (ref 0.0–2.0)
O2 Saturation: 65 %
TCO2: 25 mmol/L (ref 0–100)

## 2012-08-28 LAB — POCT I-STAT 3, ART BLOOD GAS (G3+): Acid-base deficit: 2 mmol/L (ref 0.0–2.0)

## 2012-08-28 SURGERY — LEFT AND RIGHT HEART CATHETERIZATION WITH CORONARY ANGIOGRAM
Anesthesia: LOCAL

## 2012-08-28 MED ORDER — HEPARIN (PORCINE) IN NACL 2-0.9 UNIT/ML-% IJ SOLN
INTRAMUSCULAR | Status: AC
  Filled 2012-08-28: qty 1000

## 2012-08-28 MED ORDER — ONDANSETRON HCL 4 MG/2ML IJ SOLN: 4.0000 mg | Freq: Four times a day (QID) | INTRAMUSCULAR | Status: DC | PRN

## 2012-08-28 MED ORDER — SODIUM CHLORIDE 0.9 % IV SOLN
INTRAVENOUS | Status: DC
  Administered 2012-08-28: 1000 mL via INTRAVENOUS

## 2012-08-28 MED ORDER — SODIUM CHLORIDE 0.9 % IJ SOLN: 3.0000 mL | INTRAMUSCULAR | Status: DC | PRN

## 2012-08-28 MED ORDER — ACETAMINOPHEN 325 MG PO TABS: 650.0000 mg | ORAL_TABLET | ORAL | Status: DC | PRN

## 2012-08-28 MED ORDER — SODIUM CHLORIDE 0.9 % IV SOLN: 250.0000 mL | INTRAVENOUS | Status: DC | PRN

## 2012-08-28 MED ORDER — LIDOCAINE HCL (PF) 1 % IJ SOLN
INTRAMUSCULAR | Status: AC
  Filled 2012-08-28: qty 30

## 2012-08-28 MED ORDER — ASPIRIN 81 MG PO CHEW
324.0000 mg | CHEWABLE_TABLET | ORAL | Status: AC
  Administered 2012-08-28: 324 mg via ORAL
  Filled 2012-08-28: qty 4

## 2012-08-28 MED ORDER — NITROGLYCERIN 0.2 MG/ML ON CALL CATH LAB
INTRAVENOUS | Status: AC
  Filled 2012-08-28: qty 1

## 2012-08-28 MED ORDER — FENTANYL CITRATE 0.05 MG/ML IJ SOLN
INTRAMUSCULAR | Status: AC
  Filled 2012-08-28: qty 2

## 2012-08-28 MED ORDER — MIDAZOLAM HCL 2 MG/2ML IJ SOLN
INTRAMUSCULAR | Status: AC
  Filled 2012-08-28: qty 2

## 2012-08-28 MED ORDER — SODIUM CHLORIDE 0.9 % IJ SOLN: 3.0000 mL | Freq: Two times a day (BID) | INTRAMUSCULAR | Status: DC

## 2012-08-28 MED ORDER — SODIUM CHLORIDE 0.9 % IV SOLN: 1.0000 mL/kg/h | INTRAVENOUS | Status: DC

## 2012-08-28 NOTE — H&P (View-Only) (Signed)
History and Physical   Date: 08/20/2012   Name: Ryan Winters  DOB: 07/07/1961  MRN: [id number]   PCP: AVBUERE,EDWIN A, MD  Primary Cardiologist: Dr. Michael Cooper  Primary Electrophysiologist: None   History of Present Illness:  Ryan Winters is a 51 y.o. male who returns for post hospital follow up.  He has a hx of diastolic CHF, mitral regurgitation, s/p MV repair in 2011, stage IV CKD, HTN, polysubstance abuse. He had no CAD at [facility] in 7/11 prior to his valve repair. Echo 6/13: Mild LVH, EF 55-60%, mod MR, mod to severe LAE, mild RVE, mod to sever RAE, severe TR, ? Vegatation, PASP 43. Admitted 9/14-9/16 with a/c diastolic CHF. Improved with IV diruesis. OP echo was arranged. Echo done today. Patient notes stable symptoms. He states he has slept sitting up since his prior valve surgery. Also notes frequent PND. Since d/c, no increased weight or LE edema. No chest pain. No syncope. He is clear to me today that he is not smoking, drinking ETOH or using cocaine.    Labs (9/13): K 3.5, creatinine 3.05, Hgb 9.8    Wt Readings from Last 3 Encounters:   08/20/12  145 lb (65.772 kg)   08/04/12  145 lb 15.1 oz (66.2 kg)   07/15/12  149 lb 12.8 oz (67.949 kg)     Past Medical History   Diagnosis  Date   .  Hypertension    .  Bipolar disorder    .  Schizophrenia      h/o hospitalized inpt therapy @ the [facility]   .  MVP (mitral valve prolapse)      with severe MR s/p repair with annuloplasty and cleft MV repair in 09/2010, last echo June 2013 showed EF 55-60%; restrictive physiology; mod MR, severe TR.   .  Hyperlipidemia    .  H/O: suicide attempt  1998     was hospitalized at [name]   .  Anemia      Hx of iron infusions   .  Chronic diastolic heart failure      Hx of Class IV CHF   .  Gastroparesis    .  Generalized headaches      sinus   .  Chronic kidney disease      has fistula, not on HD   .  Arthritis    .  Cocaine abuse      Current  Outpatient Prescriptions   Medication  Sig  Dispense  Refill   .  albuterol (PROVENTIL HFA;VENTOLIN HFA) 108 (90 BASE) MCG/ACT inhaler  Inhale 2 puffs into the lungs every 4 (four) hours as needed.     .  amLODipine (NORVASC) 10 MG tablet  Take 10 mg by mouth daily.     .  ARIPiprazole (ABILIFY) 5 MG tablet  Take 5 mg by mouth daily.     .  aspirin 81 MG EC tablet  Take 81 mg by mouth daily.     .  docusate sodium (COLACE) 100 MG capsule  Take 100 mg by mouth 2 (two) times daily.     .  furosemide (LASIX) 80 MG tablet  Take 1 tablet (80 mg total) by mouth 2 (two) times daily.  60 tablet  6   .  hydrALAZINE (APRESOLINE) 100 MG tablet  Take 1 tablet (100 mg total) by mouth 3 (three) times daily.  90 tablet  6   .  hydrOXYzine (VISTARIL) 50 MG capsule  Take   50 mg by mouth at bedtime.     .  lisinopril (PRINIVIL,ZESTRIL) 20 MG tablet  Take 20 mg by mouth daily.     .  metoCLOPramide (REGLAN) 10 MG tablet  Take 10 mg by mouth 4 (four) times daily - before meals and at bedtime.     .  metoprolol (LOPRESSOR) 50 MG tablet  Take 1.5 tablets (75 mg total) by mouth 2 (two) times daily.  90 tablet  6   .  omeprazole (PRILOSEC) 40 MG capsule  Take 40 mg by mouth 2 (two) times daily.     Allergies:  Allergies   Allergen  Reactions   .  Cashew Nut Oil  Anaphylaxis   .  Food  Anaphylaxis     Strawberries, Cashews, causes throat to swell - affects breathing     History   Substance Use Topics   .  Smoking status:  Former Smoker -- 30 years     Types:  Cigarettes     Quit date:  03/21/2011   .  Smokeless tobacco:  Never Used   .  Alcohol Use:  No      Quit alcohol in July of 2011     ROS: Please see the history of present illness. He notes chills. No melena, hematochezia. All other systems reviewed and negative.    PHYSICAL EXAM:  VS: BP 120/90  Ht 5' 6" (1.676 m)  Wt 145 lb (65.772 kg)  BMI 23.40 kg/m2  Well nourished, well developed, in no acute distress  HEENT: normal  Neck: no JVD    Cardiac: normal S1, S2; RRR; 3/6 holosystolic murmur along LLSB radiating to apex2  Lungs: clear to auscultation bilaterally, no wheezing, rhonchi or rales  Abd: soft, nontender, no hepatomegaly  Ext: no edema  Skin: warm and dry  Neuro: CNs 2-12 intact, no focal abnormalities noted    EKG: NSR, HR 99, LVH, NSSTTW changes    ASSESSMENT AND PLAN:   1. Mitral Regurgitation: I reviewed his echo today with Dr. Phil Nahser. He also look at his prior echos. His MR is worse and rated as severe. He has severe TR as well as mod to severe pulmo HTN. There is also a suggestion of MS although the gradient calculation does not seem accurate. Patient admits to being clean from drugs for several mos. His symptoms, although stable at this time, are severe. I reviewed with Dr. Michael Cooper. He contacted Dr. Van Trigt by phone. Will arrange TEE. Refer to Dr. Van Trigt for consideration of MV replacement.   2. Chronic Diastolic CHF: In context of valvular heart disease. BP is ok. Volume is stable. Continue current Rx.   3. Hypertension: Controlled. Continue current therapy.   4. Substance Abuse: He has done a good job staying off drugs for several mos now.   Signed,  Angelo Caroll, PA-C 3:39 PM 08/20/2012   

## 2012-08-28 NOTE — Interval H&P Note (Signed)
History and Physical Interval Note:  08/28/2012 10:13 AM  Ryan Winters  has presented today for surgery, with the diagnosis of hp  The various methods of treatment have been discussed with the patient and family. After consideration of risks, benefits and other options for treatment, the patient has consented to  Procedure(s) (LRB) with comments: LEFT AND RIGHT HEART CATHETERIZATION WITH CORONARY ANGIOGRAM (N/A) as a surgical intervention .  The patient's history has been reviewed, patient examined, no change in status, stable for surgery.  I have reviewed the patient's chart and labs.  Questions were answered to the patient's satisfaction.    The patient's TEE was reviewed and he has severe prosthetic mitral regurgitation. He presents today for right and left heart catheterization in anticipation of redo valve surgery.   Tonny Bollman

## 2012-08-28 NOTE — CV Procedure (Signed)
   Cardiac Catheterization Procedure Note  Name: Colan Laymon MRN: [id number] DOB: [birth year]  Procedure: Right Heart Cath, Left Heart Cath, Selective Coronary Angiography  Indication: Severe Mitral Regurgitation with CHF   Procedural Details: The right groin was prepped, draped, and anesthetized with 1% lidocaine. Using the modified Seldinger technique a 5 French sheath was placed in the right femoral artery and a 6 French sheath was placed in the right femoral vein. A multipurpose catheter was used for the right heart catheterization. Standard protocol was followed for recording of right heart pressures and sampling of oxygen saturations. Fick cardiac output was calculated. Standard Judkins catheters were used for selective coronary angiography and left ventriculography. There were no immediate procedural complications. The patient was transferred to the post catheterization recovery area for further monitoring.  Procedural Findings: Hemodynamics RA V wave 32, mean 22 RV 64/23 PA 57/23 mean 38 PCWP V wave 30, mean 25 LV 157/18 AO 151/98 mean 119  Oxygen saturations: PA 65 AO 97  Cardiac Output (Fick) 5.9  Cardiac Index (Fick) 3.2   Coronary angiography: Coronary dominance: right  Left mainstem: patent, no obstructive dz  Left anterior descending (LAD): Patent throughout, minimal nonobstructive plaque proximally, diags patent, vessel wraps the LV apex  Left circumflex (LCx): Patent ramus and small AV groove Lcx  Right coronary artery (RCA): nondominant, mild proximal stenosis.   Left ventriculography: Deferred secondary to CKD  Final Conclusions:   1. Nonobstructive CAD, mild 2. Hemodynamics consistent with severe TR and severe MR  Recommendations: Referral to Dr Maren Beach for consideration of redo cardiac surgery (mitral and tricuspid valves).  Tonny Bollman 08/28/2012, 10:54 AM

## 2012-08-29 ENCOUNTER — Encounter: Payer: Self-pay | Admitting: Cardiothoracic Surgery

## 2012-08-29 ENCOUNTER — Institutional Professional Consult (permissible substitution) (INDEPENDENT_AMBULATORY_CARE_PROVIDER_SITE_OTHER): Payer: Self-pay | Admitting: Cardiothoracic Surgery

## 2012-08-29 VITALS — BP 128/74 | HR 62 | Resp 18 | Ht 66.0 in | Wt 156.0 lb

## 2012-08-29 DIAGNOSIS — I059 Rheumatic mitral valve disease, unspecified: Secondary | ICD-10-CM

## 2012-08-29 DIAGNOSIS — Z9889 Other specified postprocedural states: Secondary | ICD-10-CM

## 2012-08-29 DIAGNOSIS — I079 Rheumatic tricuspid valve disease, unspecified: Secondary | ICD-10-CM

## 2012-08-29 DIAGNOSIS — I340 Nonrheumatic mitral (valve) insufficiency: Secondary | ICD-10-CM

## 2012-08-29 DIAGNOSIS — F209 Schizophrenia, unspecified: Secondary | ICD-10-CM

## 2012-08-29 DIAGNOSIS — I071 Rheumatic tricuspid insufficiency: Secondary | ICD-10-CM

## 2012-08-29 NOTE — Progress Notes (Signed)
PCP is Dorrene German, MD Referring Provider is Tonny Bollman, MD  Chief Complaint  Patient presents with  . Mitral Regurgitation    Referral from Dr Excell Seltzer for surgical eval, S/P mitral valve repair in 05/2010, Cardiac Cath 08/28/12  . Tricuspid Regurgitation    HPI: 51 year old Afro-American male with poorly controlled hypertension, bipolar disorder with schizophrenia and history of suicide attempt, and use of illicit drugs returns with significant mitral rotation after a mitral valve repair in July 2011. He has had recurrent dyspnea on exertion, orthopnea, and recurrence of a murmur. His original operation consisted of a ring annuloplasty and closure of the anterior leaflet cleft which left him with trace MR by TEE at the time of surgery. He septally has had recurrent problems with uncontrolled hypertension and drug use post cardiac surgery but for the past several months has been clean. Repeat echo showed moderate to severe mitral regurgitation. And moderate to severe tricuspid regurgitation. Recent right and left heart cath indicating no significant CAD. LV function is fairly well preserved. PA pressure is 58/25. RA pressure is 22. Cardiac index his 2.4 bv Fick calculation. He is referred for consideration of mitral valve replacement tricuspid valve repair.  The patient's past history significant for severe renal disease followed by nephrology. He has a left forearm AV fistula placed but has not yet been placed on dialysis. His creatinine is 3.0-3.5. Past Medical History  Diagnosis Date  . Hypertension   . Bipolar disorder   . Schizophrenia     h/o hospitalized inpt therapy @ the [facility]  . 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   Marland Kitchen Hyperlipidemia   . H/O: suicide attempt 1998    was hospitalized at [facility]  . Anemia      Hx of iron infusions  . Chronic diastolic heart failure     Hx of Class IV CHF  . Gastroparesis   . Generalized headaches     sinus  . Chronic kidney disease     has fistula, not on HD  . Arthritis   . Cocaine abuse   . Anxiety   . CHF (congestive heart failure)   . GERD (gastroesophageal reflux disease)     Past Surgical History  Procedure Date  . Mitral valve repair 05/2010  . Av fistula placement, radiocephalic 05/24/2011    Left arm  . Leg surgery   . Umbilical hernia repair 04/01/2012    Procedure: HERNIA REPAIR UMBILICAL ADULT;  Surgeon: Ardeth Sportsman, MD;  Location: [facility] OR;  Service: General;  Laterality: N/A;  supra umbilical  . Tee without cardioversion 08/25/2012    Procedure: TRANSESOPHAGEAL ECHOCARDIOGRAM (TEE);  Surgeon: Wendall Stade, MD;  Location: [facility] ENDOSCOPY;  Service: Cardiovascular;  Laterality: N/A;    Family History  Problem Relation Age of Onset  . Hypertension Mother     Social History History  Substance Use Topics  . Smoking status: Former Smoker -- 30 years    Types: Cigarettes    Quit date: 03/21/2011  . Smokeless tobacco: Never Used  . Alcohol Use: No     Quit alcohol in July of 2011    No current facility-administered medications for this visit.   No current outpatient prescriptions on file.   Facility-Administered Medications Ordered in Other Visits  Medication Dose Route Frequency Provider Last Rate  Last Dose  . 0.9 %  sodium chloride infusion  250 mL Intravenous PRN Tonny Bollman, MD      . 0.9 %  sodium chloride infusion   Intravenous Continuous Tonny Bollman, MD 75 mL/hr at 08/28/12 0742 1,000 mL at 08/28/12 0742  . 0.9 %  sodium chloride infusion  1 mL/kg/hr Intravenous Continuous Tonny Bollman, MD      . acetaminophen (TYLENOL) tablet 650 mg  650 mg Oral Q4H PRN Tonny Bollman, MD      . ondansetron [facility]) injection 4 mg  4 mg Intravenous Q6H PRN Tonny Bollman, MD      . sodium chloride 0.9 % injection 3 mL  3 mL  Intravenous Q12H Tonny Bollman, MD      . sodium chloride 0.9 % injection 3 mL  3 mL Intravenous PRN Tonny Bollman, MD        Allergies  Allergen Reactions  . Cashew Nut Oil Anaphylaxis  . Food Anaphylaxis    Strawberries, Cashews, causes throat to swell - affects breathing    Review of Systems question of vegetation on the tricuspid valve. Patient denies recent fevers or night sweats. He states he has lost some weight down to 145 pounds. No hemoptysis or recent productive cough. No ankle swelling. No recent cocaine use or smoking or alcohol.  BP 128/74  Pulse 62  Resp 18  Ht 5\' 6"  (1.676 m)  Wt 156 lb (70.761 kg)  BMI 25.18 kg/m2  SpO2 98% Physical Exam Gen. middle-age black male anxious but in no distress HEENT pupils small dentition adequate Neck mild JVD no mass palpable or bruit present Thorax well-healed sternal incision breath sounds clear no deformity or tenderness Cardiac regular rhythm 3 or 6 holosystolic murmur no gallop Abdomen soft nontender without pulsatile mass Extremities no clubbing cyanosis or edema Vascular positive pulses all extremities Neurologic no focal motor deficit alert and responsive, complete by mother  Diagnostic Tests: TEE reviewed with right heart cath data reviewed chest x-ray reviewed  Impression: Recurrent moderate to severe MR with class III to class IV symptoms of. Making sinus rhythm. Would benefit from mitral valve replacement with a tissue valve the to his psychosocial situation to avoid Coumadin and probable tricuspid ring annuloplasty. Redo surgery would be at increased risk significantly due to his renal disease and could result in permanent hemodialysis which would be difficult in this patient Plan: Dental evaluation and cleaning and return to the office to discuss scheduling a date for high-risk redo mitral valve replacement and tricuspid valve repair

## 2012-09-04 ENCOUNTER — Emergency Department (HOSPITAL_COMMUNITY)
Admission: EM | Admit: 2012-09-04 | Discharge: 2012-09-04 | Disposition: A | Discharge status: Home / Self Care | Payer: Medicaid Other | Attending: Emergency Medicine | Admitting: Emergency Medicine

## 2012-09-04 ENCOUNTER — Encounter (HOSPITAL_COMMUNITY): Payer: Self-pay | Admitting: *Deleted

## 2012-09-04 DIAGNOSIS — K219 Gastro-esophageal reflux disease without esophagitis: Secondary | ICD-10-CM | POA: Insufficient documentation

## 2012-09-04 DIAGNOSIS — Z87891 Personal history of nicotine dependence: Secondary | ICD-10-CM | POA: Insufficient documentation

## 2012-09-04 DIAGNOSIS — N189 Chronic kidney disease, unspecified: Secondary | ICD-10-CM | POA: Insufficient documentation

## 2012-09-04 DIAGNOSIS — F319 Bipolar disorder, unspecified: Secondary | ICD-10-CM | POA: Insufficient documentation

## 2012-09-04 DIAGNOSIS — I059 Rheumatic mitral valve disease, unspecified: Secondary | ICD-10-CM | POA: Insufficient documentation

## 2012-09-04 DIAGNOSIS — I129 Hypertensive chronic kidney disease with stage 1 through stage 4 chronic kidney disease, or unspecified chronic kidney disease: Secondary | ICD-10-CM | POA: Insufficient documentation

## 2012-09-04 DIAGNOSIS — M129 Arthropathy, unspecified: Secondary | ICD-10-CM | POA: Insufficient documentation

## 2012-09-04 DIAGNOSIS — R109 Unspecified abdominal pain: Secondary | ICD-10-CM | POA: Insufficient documentation

## 2012-09-04 DIAGNOSIS — D649 Anemia, unspecified: Secondary | ICD-10-CM | POA: Insufficient documentation

## 2012-09-04 DIAGNOSIS — F209 Schizophrenia, unspecified: Secondary | ICD-10-CM | POA: Insufficient documentation

## 2012-09-04 DIAGNOSIS — E785 Hyperlipidemia, unspecified: Secondary | ICD-10-CM | POA: Insufficient documentation

## 2012-09-04 DIAGNOSIS — K3184 Gastroparesis: Secondary | ICD-10-CM | POA: Insufficient documentation

## 2012-09-04 LAB — COMPREHENSIVE METABOLIC PANEL
ALT: 32 U/L (ref 0–53)
Albumin: 3.3 g/dL — ABNORMAL LOW (ref 3.5–5.2)
Alkaline Phosphatase: 162 U/L — ABNORMAL HIGH (ref 39–117)
BUN: 31 mg/dL — ABNORMAL HIGH (ref 6–23)
Chloride: 107 mEq/L (ref 96–112)
Potassium: 4.1 mEq/L (ref 3.5–5.1)
Sodium: 141 mEq/L (ref 135–145)
Total Bilirubin: 1.9 mg/dL — ABNORMAL HIGH (ref 0.3–1.2)

## 2012-09-04 LAB — URINE MICROSCOPIC-ADD ON

## 2012-09-04 LAB — CBC WITH DIFFERENTIAL/PLATELET
Basophils Relative: 1 % (ref 0–1)
Hemoglobin: 10 g/dL — ABNORMAL LOW (ref 13.0–17.0)
Lymphs Abs: 1 10*3/uL (ref 0.7–4.0)
MCHC: 33.1 g/dL (ref 30.0–36.0)
Monocytes Relative: 11 % (ref 3–12)
Neutro Abs: 3.9 10*3/uL (ref 1.7–7.7)
Neutrophils Relative %: 65 % (ref 43–77)
RBC: 3.48 MIL/uL — ABNORMAL LOW (ref 4.22–5.81)

## 2012-09-04 LAB — LIPASE, BLOOD: Lipase: 36 U/L (ref 11–59)

## 2012-09-04 LAB — URINALYSIS, ROUTINE W REFLEX MICROSCOPIC
Bilirubin Urine: NEGATIVE
Glucose, UA: NEGATIVE mg/dL
Ketones, ur: NEGATIVE mg/dL
Nitrite: NEGATIVE
pH: 6 (ref 5.0–8.0)

## 2012-09-04 LAB — POCT I-STAT TROPONIN I: Troponin i, poc: 0.02 ng/mL (ref 0.00–0.08)

## 2012-09-04 MED ORDER — METOCLOPRAMIDE HCL 5 MG/ML IJ SOLN
10.0000 mg | Freq: Once | INTRAMUSCULAR | Status: AC
  Administered 2012-09-04: 10 mg via INTRAVENOUS
  Filled 2012-09-04: qty 2

## 2012-09-04 MED ORDER — MORPHINE SULFATE 4 MG/ML IJ SOLN
4.0000 mg | Freq: Once | INTRAMUSCULAR | Status: AC
  Administered 2012-09-04: 4 mg via INTRAVENOUS
  Filled 2012-09-04: qty 1

## 2012-09-04 MED ORDER — ONDANSETRON 8 MG PO TBDP: 8.0000 mg | ORAL_TABLET | Freq: Three times a day (TID) | ORAL | Status: DC | PRN

## 2012-09-04 MED ORDER — SODIUM CHLORIDE 0.9 % IV BOLUS (SEPSIS)
500.0000 mL | Freq: Once | INTRAVENOUS | Status: AC
  Administered 2012-09-04: 500 mL via INTRAVENOUS

## 2012-09-04 MED ORDER — OXYCODONE-ACETAMINOPHEN 5-325 MG PO TABS: 1.0000 | ORAL_TABLET | ORAL | Status: DC | PRN

## 2012-09-04 NOTE — ED Notes (Signed)
Pt in from home via [facility], pt c/o central CP radiaiting L arm & abd, pt describes pain as stabbing tightness, now describes as dull ache upon arrival to ED, pt rcvd x 4 SL nitro & ASA 324 prior to arrival & 4 mg Zofran, pt A&O x4, follows commands, speaks in complete sentences

## 2012-09-04 NOTE — ED Provider Notes (Signed)
Medical screening examination/treatment/procedure(s) were performed by non-physician practitioner and as supervising physician I was immediately available for consultation/collaboration.  Liliani Bobo R. Loreal Schuessler, MD 09/04/12 2355 

## 2012-09-04 NOTE — ED Provider Notes (Signed)
History     CSN: [id number]  Arrival date & time 09/04/12  1916   First MD Initiated Contact with Patient 09/04/12 1929      Chief Complaint  Patient presents with  . Chest Pain    (Consider location/radiation/quality/duration/timing/severity/associated sxs/prior treatment) Patient is a 51 y.o. male presenting with abdominal pain. The history is provided by the patient.  Abdominal Pain The primary symptoms of the illness include abdominal pain, nausea and vomiting. The primary symptoms of the illness do not include fever, shortness of breath, diarrhea, hematemesis or dysuria. The onset of the illness was gradual. The problem has not changed since onset. Associated symptoms comments: He reports history of gastroparesis now with recurrent abdominal pain described as "all over", N, V. No diarrhea, no fever. He had chest pain after vomiting began that has subsided. He states this feels as it has in the past with his gastroparesis..    Past Medical History  Diagnosis Date  . Hypertension   . Bipolar disorder   . Schizophrenia     h/o hospitalized inpt therapy @ the [facility]  . 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   Marland Kitchen Hyperlipidemia   . H/O: suicide attempt 1998    was hospitalized at [facility]  . Anemia     Hx of iron infusions  . Chronic diastolic heart failure     Hx of Class IV CHF  . Gastroparesis   . Generalized headaches     sinus  . Chronic kidney disease     has fistula, not on HD  . Arthritis   . Cocaine abuse   . Anxiety   . CHF (congestive heart failure)   . GERD (gastroesophageal reflux disease)     Past Surgical History  Procedure Date  . Mitral valve repair 05/2010  . Av fistula placement, radiocephalic 05/24/2011    Left arm  . Leg surgery   . Umbilical hernia repair 04/01/2012   Procedure: HERNIA REPAIR UMBILICAL ADULT;  Surgeon: Ardeth Sportsman, MD;  Location: [facility] OR;  Service: General;  Laterality: N/A;  supra umbilical  . Tee without cardioversion 08/25/2012    Procedure: TRANSESOPHAGEAL ECHOCARDIOGRAM (TEE);  Surgeon: Wendall Stade, MD;  Location: [facility] ENDOSCOPY;  Service: Cardiovascular;  Laterality: N/A;    Family History  Problem Relation Age of Onset  . Hypertension Mother     History  Substance Use Topics  . Smoking status: Former Smoker -- 30 years    Types: Cigarettes    Quit date: 03/21/2011  . Smokeless tobacco: Never Used  . Alcohol Use: No     Quit alcohol in July of 2011      Review of Systems  Constitutional: Negative for fever.  Respiratory: Negative for cough and shortness of breath.   Cardiovascular: Positive for chest pain.  Gastrointestinal: Positive for nausea, vomiting and abdominal pain. Negative for diarrhea and hematemesis.  Genitourinary: Negative for dysuria.    Allergies  Cashew nut oil and Food  Home Medications   Current Outpatient Rx  Name Route Sig Dispense Refill  . ALBUTEROL SULFATE HFA 108 (90 BASE) MCG/ACT IN AERS Inhalation Inhale 2 puffs into the lungs every 4 (four) hours as needed. For shortness of breath or wheezing    . AMLODIPINE BESYLATE 10 MG PO TABS Oral Take 10 mg by  mouth daily.    . ARIPIPRAZOLE 5 MG PO TABS Oral Take 5 mg by mouth daily.      . ASPIRIN 81 MG PO TBEC Oral Take 81 mg by mouth daily.      Marland Kitchen DOCUSATE SODIUM 100 MG PO CAPS Oral Take 100 mg by mouth 2 (two) times daily as needed. For constipation    . FUROSEMIDE 80 MG PO TABS Oral Take 1 tablet (80 mg total) by mouth 2 (two) times daily. 60 tablet 6  . HYDRALAZINE HCL 100 MG PO TABS Oral Take 1 tablet (100 mg total) by mouth 3 (three) times daily. 90 tablet 6  . HYDROXYZINE PAMOATE 50 MG PO CAPS Oral Take 50 mg by mouth at bedtime.     Marland Kitchen LISINOPRIL 20 MG PO TABS Oral Take 20 mg by mouth daily.    Marland Kitchen METOCLOPRAMIDE HCL 10 MG PO TABS Oral  Take 10 mg by mouth 4 (four) times daily -  before meals and at bedtime.    Marland Kitchen METOPROLOL TARTRATE 50 MG PO TABS Oral Take 1.5 tablets (75 mg total) by mouth 2 (two) times daily. 90 tablet 6  . OMEPRAZOLE 40 MG PO CPDR Oral Take 40 mg by mouth 2 (two) times daily as needed. For heart burn      BP 165/114  Pulse 84  Temp 98 F (36.7 C) (Oral)  SpO2 100%  Physical Exam  Constitutional: He is oriented to person, place, and time. He appears well-developed and well-nourished.       Uncomfortable appearing.  HENT:  Head: Normocephalic.  Neck: Normal range of motion. Neck supple.  Cardiovascular: Normal rate and regular rhythm.   Murmur heard. Pulmonary/Chest: Effort normal and breath sounds normal.  Abdominal: Soft. There is no rebound and no guarding.       Generalized tenderness.   Musculoskeletal: Normal range of motion.  Neurological: He is alert and oriented to person, place, and time.  Skin: Skin is warm and dry. No rash noted.  Psychiatric: He has a normal mood and affect.    ED Course  Procedures (including critical care time)   Labs Reviewed  CBC WITH DIFFERENTIAL  COMPREHENSIVE METABOLIC PANEL  LIPASE, BLOOD  URINALYSIS, ROUTINE W REFLEX MICROSCOPIC   Results for orders placed during the hospital encounter of 09/04/12  CBC WITH DIFFERENTIAL      Component Value Range   WBC 6.0  4.0 - 10.5 K/uL   RBC 3.48 (*) 4.22 - 5.81 MIL/uL   Hemoglobin 10.0 (*) 13.0 - 17.0 g/dL   HCT 09.8 (*) 11.9 - 14.7 %   MCV 86.8  78.0 - 100.0 fL   MCH 28.7  26.0 - 34.0 pg   MCHC 33.1  30.0 - 36.0 g/dL   RDW 82.9 (*) 56.2 - 13.0 %   Platelets 157  150 - 400 K/uL   Neutrophils Relative 65  43 - 77 %   Neutro Abs 3.9  1.7 - 7.7 K/uL   Lymphocytes Relative 16  12 - 46 %   Lymphs Abs 1.0  0.7 - 4.0 K/uL   Monocytes Relative 11  3 - 12 %   Monocytes Absolute 0.7  0.1 - 1.0 K/uL   Eosinophils Relative 7 (*) 0 - 5 %   Eosinophils Absolute 0.4  0.0 - 0.7 K/uL   Basophils Relative 1  0 - 1  %   Basophils Absolute 0.0  0.0 - 0.1 K/uL  COMPREHENSIVE METABOLIC PANEL  Component Value Range   Sodium 141  135 - 145 mEq/L   Potassium 4.1  3.5 - 5.1 mEq/L   Chloride 107  96 - 112 mEq/L   CO2 25  19 - 32 mEq/L   Glucose, Bld 97  70 - 99 mg/dL   BUN 31 (*) 6 - 23 mg/dL   Creatinine, Ser 5.78 (*) 0.50 - 1.35 mg/dL   Calcium 9.0  8.4 - 46.9 mg/dL   Total Protein 6.6  6.0 - 8.3 g/dL   Albumin 3.3 (*) 3.5 - 5.2 g/dL   AST 54 (*) 0 - 37 U/L   ALT 32  0 - 53 U/L   Alkaline Phosphatase 162 (*) 39 - 117 U/L   Total Bilirubin 1.9 (*) 0.3 - 1.2 mg/dL   GFR calc non Af Amer 23 (*) >90 mL/min   GFR calc Af Amer 27 (*) >90 mL/min  LIPASE, BLOOD      Component Value Range   Lipase 36  11 - 59 U/L  URINALYSIS, ROUTINE W REFLEX MICROSCOPIC      Component Value Range   Color, Urine AMBER (*) YELLOW   APPearance CLEAR  CLEAR   Specific Gravity, Urine 1.024  1.005 - 1.030   pH 6.0  5.0 - 8.0   Glucose, UA NEGATIVE  NEGATIVE mg/dL   Hgb urine dipstick LARGE (*) NEGATIVE   Bilirubin Urine NEGATIVE  NEGATIVE   Ketones, ur NEGATIVE  NEGATIVE mg/dL   Protein, ur 629 (*) NEGATIVE mg/dL   Urobilinogen, UA 1.0  0.0 - 1.0 mg/dL   Nitrite NEGATIVE  NEGATIVE   Leukocytes, UA NEGATIVE  NEGATIVE  URINE MICROSCOPIC-ADD ON      Component Value Range   Squamous Epithelial / LPF RARE  RARE   RBC / HPF 3-6  <3 RBC/hpf   Bacteria, UA FEW (*) RARE  POCT I-STAT TROPONIN I      Component Value Range   Troponin i, poc 0.02  0.00 - 0.08 ng/mL   Comment 3             No results found.   No diagnosis found.  1. Abdominal pain 2. History of gastroparesis   MDM  Reglan and Morphine given with complete resolution of symptoms. Re-examination - Abdomen nontender. No further vomiting. Review of records shows that the patient had a catheterization on 08-28-12 that showed no CAD, only significant valvular disease.         Rodena Medin, PA-C 09/04/12 2215

## 2012-09-04 NOTE — ED Notes (Signed)
Pt states, "I take a lot of medicine, but have not been able to keep them down."

## 2012-09-09 ENCOUNTER — Ambulatory Visit (HOSPITAL_COMMUNITY): Payer: Medicaid Other | Admitting: Dentistry

## 2012-09-09 ENCOUNTER — Encounter (HOSPITAL_COMMUNITY): Payer: Self-pay | Admitting: Dentistry

## 2012-09-09 VITALS — BP 147/95 | HR 78 | Temp 98.1°F

## 2012-09-09 DIAGNOSIS — K036 Deposits [accretions] on teeth: Secondary | ICD-10-CM

## 2012-09-09 DIAGNOSIS — K029 Dental caries, unspecified: Secondary | ICD-10-CM

## 2012-09-09 DIAGNOSIS — K0530 Chronic periodontitis, unspecified: Secondary | ICD-10-CM

## 2012-09-09 DIAGNOSIS — Z0271 Encounter for disability determination: Secondary | ICD-10-CM

## 2012-09-09 DIAGNOSIS — K08109 Complete loss of teeth, unspecified cause, unspecified class: Secondary | ICD-10-CM

## 2012-09-09 DIAGNOSIS — M264 Malocclusion, unspecified: Secondary | ICD-10-CM

## 2012-09-09 MED ORDER — AMOXICILLIN 500 MG PO CAPS: ORAL_CAPSULE | ORAL | Status: DC

## 2012-09-09 NOTE — Patient Instructions (Addendum)
Return to dental clinic for dental cleaning and filling appointment as scheduled. Take amoxicillin 500mg -Four capsules one hour before dental appointment as premedication.

## 2012-09-09 NOTE — Progress Notes (Signed)
DENTAL CONSULTATION  Date of Consultation:  09/09/2012 Patient Name:   Ryan Winters Date of Birth:   1961-01-29 Medical Record Number: [id number]  VITALS: BP 147/95  Pulse 78  Temp 98.1 F (36.7 C) (Oral)   CHIEF COMPLAINT: Patient referred for a pre-heart valve replacement dental protocol evaluation.  HPI: Ryan Winters is a 51 year old male with a history of mitral valve repair. Patient with worsening mitral regurgitation. Patient with anticipated mitral valve replacement along with possible tricuspid valve repair. Patient now seen as part of a pre-heart valve surgery dental protocol evaluation to rule out dental infection that may affect the patient's systemic health and anticipated heart valve surgery.  The patient currently denies acute toothache, swellings, or abscesses. Patient was last seen approximately one year ago to have an exam and cleaning performed. Patient saw Dr. Ned Card at that time. Patient denies having any unmet dental needs at this time.   PMH: Past Medical History  Diagnosis Date  . Hypertension   . Bipolar disorder   . Schizophrenia     h/o hospitalized inpt therapy @ the [facility]  . 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   Marland Kitchen Hyperlipidemia   . H/O: suicide attempt 1998    was hospitalized at [facility]  . Anemia     Hx of iron infusions  . Chronic diastolic heart failure     Hx of Class IV CHF  . Gastroparesis   . Generalized headaches     sinus  . Chronic kidney disease     has fistula, not on HD  . Arthritis   . Cocaine abuse   . Anxiety   . CHF (congestive heart failure)   . GERD (gastroesophageal reflux disease)     PSH: Past Surgical History  Procedure Date  . Mitral valve repair 05/2010  . Av fistula placement, radiocephalic 05/24/2011    Left arm  . Leg  surgery   . Umbilical hernia repair 04/01/2012    Procedure: HERNIA REPAIR UMBILICAL ADULT;  Surgeon: Ardeth Sportsman, MD;  Location: [facility] OR;  Service: General;  Laterality: N/A;  supra umbilical  . Tee without cardioversion 08/25/2012    Procedure: TRANSESOPHAGEAL ECHOCARDIOGRAM (TEE);  Surgeon: Wendall Stade, MD;  Location: [facility] ENDOSCOPY;  Service: Cardiovascular;  Laterality: N/A;    ALLERGIES: Allergies  Allergen Reactions  . Cashew Nut Oil Anaphylaxis  . Food Anaphylaxis    Strawberries, Cashews, causes throat to swell - affects breathing    MEDICATIONS: Current Outpatient Prescriptions  Medication Sig Dispense Refill  . albuterol (PROVENTIL HFA;VENTOLIN HFA) 108 (90 BASE) MCG/ACT inhaler Inhale 2 puffs into the lungs every 4 (four) hours as needed. For shortness of breath or wheezing      . amLODipine (NORVASC) 10 MG tablet Take 10 mg by mouth daily.      . ARIPiprazole (ABILIFY) 5 MG tablet Take 5 mg by mouth daily.        Marland Kitchen aspirin 81 MG EC tablet Take 81 mg by mouth daily.        Marland Kitchen docusate sodium (COLACE) 100 MG capsule Take 100 mg by mouth 2 (two) times daily as needed. For constipation      . furosemide (LASIX) 80 MG tablet Take 1 tablet (80 mg total) by mouth 2 (two) times daily.  60 tablet  6  . hydrALAZINE (APRESOLINE) 100 MG tablet Take 1 tablet (100 mg total) by mouth 3 (three) times daily.  90 tablet  6  . hydrOXYzine (VISTARIL) 50 MG capsule Take 50 mg by mouth at bedtime.       Marland Kitchen lisinopril (PRINIVIL,ZESTRIL) 20 MG tablet Take 20 mg by mouth daily.      . metoCLOPramide (REGLAN) 10 MG tablet Take 10 mg by mouth 4 (four) times daily -  before meals and at bedtime.      . metoprolol (LOPRESSOR) 50 MG tablet Take 1.5 tablets (75 mg total) by mouth 2 (two) times daily.  90 tablet  6  . omeprazole (PRILOSEC) 40 MG capsule Take 40 mg by mouth 2 (two) times daily as needed. For heart burn      . ondansetron (ZOFRAN ODT) 8 MG disintegrating tablet Take 1 tablet (8 mg total) by  mouth every 8 (eight) hours as needed for nausea.  5 tablet  0  . oxyCODONE-acetaminophen (ROXICET) 5-325 MG per tablet Take 1 tablet by mouth every 4 (four) hours as needed for pain.  12 tablet  0  . amoxicillin (AMOXIL) 500 MG capsule Take four capsules one hour before dental appointment.  4 capsule  1    LABS: Lab Results  Component Value Date   WBC 6.0 09/04/2012   HGB 10.0* 09/04/2012   HCT 30.2* 09/04/2012   MCV 86.8 09/04/2012   PLT 157 09/04/2012      Component Value Date/Time   NA 141 09/04/2012 2035   K 4.1 09/04/2012 2035   CL 107 09/04/2012 2035   CO2 25 09/04/2012 2035   GLUCOSE 97 09/04/2012 2035   BUN 31* 09/04/2012 2035   CREATININE 2.93* 09/04/2012 2035   CALCIUM 9.0 09/04/2012 2035   CALCIUM PENDING 06/28/2011 1331   CALCIUM 9.3 06/28/2011 1331   GFRNONAA 23* 09/04/2012 2035   GFRAA 27* 09/04/2012 2035   Lab Results  Component Value Date   INR 1.22 08/25/2012   INR 1.2* 08/20/2012   INR 1.21 05/09/2012   No results found for this basename: PTT    SOCIAL HISTORY: History   Social History  . Marital Status: Married    Spouse Name: N/A    Number of Children: N/A  . Years of Education: N/A   Occupational History  . disabled    Social History Main Topics  . Smoking status: Former Smoker -- 1.5 packs/day for 30 years    Types: Cigarettes    Quit date: 03/21/2011  . Smokeless tobacco: Never Used  . Alcohol Use: No     Quit alcohol in July of 2011  . Drug Use: Yes    Special: Cocaine, Marijuana     None since November when had rectal bleeding and went to hospital  . Sexually Active: No   Other Topics Concern  . Not on file   Social History Narrative   10/22/2013He lives with mother and is separated from wife. Daughter in [place] with 2 grandchildren. He denies any cigs, drugs, alcohol at present.     FAMILY HISTORY: Family History  Problem Relation Age of Onset  . Hypertension Mother      REVIEW OF SYSTEMS: Reviewed with patient and  included in dental chart.  DENTAL HISTORY: CHIEF COMPLAINT: Patient referred for a pre-heart valve replacement dental protocol evaluation.  HPI: Ryan Winters is a 51 year old male with a history of mitral valve repair. Patient with worsening mitral regurgitation. Patient with anticipated mitral valve replacement  along with possible tricuspid valve repair. Patient now seen as part of a pre-heart valve surgery dental protocol evaluation to rule out dental infection that may affect the patient's systemic health and anticipated heart valve surgery.  The patient currently denies acute toothache, swellings, or abscesses. Patient was last seen approximately one year ago to have an exam and cleaning performed. Patient saw Dr. Ned Card at that time. Patient denies having any unmet dental needs at this time.    DENTAL EXAMINATION:  GENERAL: Patient well-developed, well-nourished male in no acute distress. HEAD AND NECK: There is no lymphadenopathy. Patient denies acute TMJ symptoms. INTRAORAL EXAM: Patient has incipient xerostomia.  Patient has deep palatal vault. DENTITION: Patient is missing tooth numbers 16, 19, and tooth #30. PERIODONTAL: Patient with chronic periodontitis with plaque accumulations, selective areas of gingival recession and no significant tooth mobility. Periodontal charting was deferred secondary to need for antibiotic premedication. DENTAL CARIES/SUBOPTIMAL RESTORATIONS: Dental caries are affecting tooth #31 at the distal. ENDODONTIC: Patient currently denies acute pulpitis symptoms. I do not see any evidence of periapical pathology. CROWN AND BRIDGE: There are no crown or bridge restorations. PROSTHODONTIC:There are no dentures  OCCLUSION:Patient has a poor occlusal scheme secondary to multiple missing teeth, supra-eruption and drifting of the unopposed teeth into the edentulous areas, and bilateral posterior crossbite.  RADIOGRAPHIC INTERPRETATION: A full series of  dental radiographs was taken. There are multiple missing teeth. There is supra-eruption and drifting of the unopposed teeth into the edentulous areas. Dental caries are affecting the distal of tooth #31. There is incipient to moderate bone loss. There is no obvious  periapical radiolucency or pathology.   ASSESSMENTS: 1. Chronic periodontitis with bone loss 2. Plaque and calculus accumulations 3. Selective areas of gingival recession 4. Missing teeth 5. Bilateral posterior crossbite 6. Supra-eruption and drifting of the unopposed teeth into the edentulous areas 7. Dental caries 8. Poor occlusal scheme but a stable occlusion 9. Need for an about a premedication prior to invasive dental procedures due to previous mitral valve repair and anticipated mitral valve replacement.    PLAN/RECOMMENDATIONS: 1. I discussed the risks, benefits, and complications of various treatment options with the patient in relationship to his medical and dental conditions. We discussed various treatment options to include no treatment, extractions with alveoloplasty, pre-prosthetic surgery as indicated, periodontal therapy, dental restorations, root canal therapy, crown and bridge therapy, implant therapy, and replacement of missing teeth as indicated. The patient currently wishes to proceed with dental restoration of tooth #31 with initial periodontal therapy with antibiotic premedication. Patient understands that the caries may be extensive on tooth #31 and may require future root canal therapy or extraction.  He understands that he will need antibiotic premedication prior to invasive dental procedures and indicates that he has no problems taking amoxicillin 500 mg-4 capsules one are prior dental appointment for the premedication.  This prescription was faxed to [facility] as per his agreement. Patient will then need to followup with a primary dentist of her choice for routine exam and cleaning after he is medically  stable from the heart valve surgery.   2. Discussion of findings with medical team and coordination of future medical and dental care.  Charlynne Pander, DDS

## 2012-09-10 ENCOUNTER — Encounter (HOSPITAL_COMMUNITY): Payer: Self-pay | Admitting: Dentistry

## 2012-09-10 ENCOUNTER — Emergency Department (HOSPITAL_COMMUNITY)
Admission: EM | Admit: 2012-09-10 | Discharge: 2012-09-10 | Discharge status: Against Medical Advice | Payer: Medicaid Other | Attending: Emergency Medicine | Admitting: Emergency Medicine

## 2012-09-10 ENCOUNTER — Ambulatory Visit (HOSPITAL_COMMUNITY): Payer: Self-pay | Admitting: Dentistry

## 2012-09-10 ENCOUNTER — Encounter (HOSPITAL_COMMUNITY): Payer: Self-pay | Admitting: Emergency Medicine

## 2012-09-10 VITALS — BP 115/65 | HR 65 | Temp 98.7°F

## 2012-09-10 DIAGNOSIS — K0530 Chronic periodontitis, unspecified: Secondary | ICD-10-CM

## 2012-09-10 DIAGNOSIS — K036 Deposits [accretions] on teeth: Secondary | ICD-10-CM

## 2012-09-10 DIAGNOSIS — K029 Dental caries, unspecified: Secondary | ICD-10-CM

## 2012-09-10 DIAGNOSIS — R109 Unspecified abdominal pain: Secondary | ICD-10-CM | POA: Insufficient documentation

## 2012-09-10 NOTE — ED Notes (Signed)
Per patient, states he has a dental appt today at 1:45-took "vitamins" they requested he take prior to appt-started having abdominal pain 15 minutes ago

## 2012-09-10 NOTE — ED Notes (Signed)
Patient left without being seen.

## 2012-09-10 NOTE — Progress Notes (Signed)
09/10/2012  Patient:            Ryan Winters Date of Birth:  April 27, 1961 MRN:                [id number]  BP 115/65  Pulse 65  Temp 98.7 F (37.1 C) (Oral)  [name] presents for dental cleaning and restoration. Patient took amoxicillin 2000 mg one hour prior dental appointment. Patient agrees to proceed with cleaning and restoration accepts the risks, benefits, complications of this proposed treatment.  Procedure: W0981 Debridement of teeth with KaVo Sonic scaler. Hnad curettes. [place]. Oral hygiene instructions. 108 mg of lidocaine with .054 epinephrine via inferior areolar nerve block of the lower right side, long buccal nerve block, deep cervical block, and infiltration #31. Patient still without profound anesthesia for total removal of caries and definitive restoration. We discussed various treatment options at that time. Patient decided that he wanted to have the OB amalgam replaced today and he would follow up with definitve restoration after the valve surgery. Tooth is asymptomatic and no periapical pathology is noted and this should be acceptable as long as patient follows up with treatment after he is medically cleared from the valve surgery.  [id number] #31 OB/amalgam placed. gluma, tytin alloy.Occlusion acceptable.  RTC in one month for F/U evaluation after heart valve surgery. Call if problems arise before then. Patient dismissed in stable condition.   Charlynne Pander, DDS

## 2012-09-16 ENCOUNTER — Ambulatory Visit: Payer: Self-pay | Admitting: Nurse Practitioner

## 2012-09-16 ENCOUNTER — Encounter: Payer: Self-pay | Admitting: Cardiothoracic Surgery

## 2012-09-16 ENCOUNTER — Ambulatory Visit (INDEPENDENT_AMBULATORY_CARE_PROVIDER_SITE_OTHER): Payer: Self-pay | Admitting: Cardiothoracic Surgery

## 2012-09-16 VITALS — BP 123/84 | HR 81 | Resp 18 | Ht 67.0 in | Wt 152.0 lb

## 2012-09-16 DIAGNOSIS — I059 Rheumatic mitral valve disease, unspecified: Secondary | ICD-10-CM

## 2012-09-16 DIAGNOSIS — I340 Nonrheumatic mitral (valve) insufficiency: Secondary | ICD-10-CM

## 2012-09-16 DIAGNOSIS — I071 Rheumatic tricuspid insufficiency: Secondary | ICD-10-CM

## 2012-09-16 DIAGNOSIS — I079 Rheumatic tricuspid valve disease, unspecified: Secondary | ICD-10-CM

## 2012-09-16 NOTE — Progress Notes (Signed)
PCP is Dorrene German, MD Referring Provider is Tonny Bollman, MD  Chief Complaint  Patient presents with  . Mitral Regurgitation    Further discuss surgery, S/P Dental eval  . 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 Patient has been helping symptoms of dyspnea on exertion and orthopnea. Repeat right heart and left heart cath shows no significant coronary disease with PA pressures of 60/25 cardiac index of 2.4 next venous saturation 65%. Transesophageal echocardiogram shows severe TR and severe MR. The patient is maintained sinus rhythm. He has had dental clearance by Dr. Kristin Bruins of the dental clinic. He is referred by Dr. Excell Seltzer for redo mitral valve replacement with a tissue valve and tricuspid valve annuloplasty repair.  He denies any recent tobacco smoking or use of illicit drugs. He denies fever productive cough or weight loss.  His past medical history significant for chronic renal insufficiency with a creatinine of 3.1. He has had a left forearm AV fistula in the past that is never been on dialysis. He also has fairly significant hypertension on Apresoline lisinopril metoprolol and amlodipine. He takes Abilify and Vistaril for psychotropic therapy.  Past Medical History  Diagnosis Date  . Hypertension   . Bipolar disorder   . Schizophrenia     h/o hospitalized inpt therapy @ the [facility]  . 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   Marland Kitchen Hyperlipidemia   . H/O: suicide attempt 1998    was hospitalized at [facility]  . Anemia     Hx of iron infusions  . Chronic diastolic heart failure     Hx of Class IV CHF  . Gastroparesis   . Generalized headaches     sinus  . Chronic kidney disease     has fistula, not on HD  . Arthritis   . Cocaine abuse   . Anxiety   . CHF (congestive heart failure)   . GERD (gastroesophageal reflux disease)     Past Surgical History  Procedure Date  . Mitral valve repair 05/2010  . Av fistula placement, radiocephalic 05/24/2011    Left arm  . Leg surgery   . Umbilical hernia repair 04/01/2012    Procedure: HERNIA REPAIR UMBILICAL ADULT;  Surgeon: Ardeth Sportsman, MD;  Location: [facility] OR;  Service: General;  Laterality: N/A;  supra umbilical  . Tee without cardioversion 08/25/2012    Procedure: TRANSESOPHAGEAL ECHOCARDIOGRAM (TEE);  Surgeon: Wendall Stade, MD;  Location: [facility] ENDOSCOPY;  Service: Cardiovascular;  Laterality: N/A;    Family History  Problem Relation Age of Onset  . Hypertension Mother     Social History History  Substance Use Topics  . Smoking status: Former Smoker -- 1.5 packs/day for 30 years    Types: Cigarettes    Quit date: 03/21/2011  . Smokeless tobacco: Never Used  . Alcohol Use: No     Quit alcohol in July of 2011    Current Outpatient Prescriptions  Medication Sig Dispense Refill  . albuterol (PROVENTIL HFA;VENTOLIN HFA) 108 (90 BASE) MCG/ACT inhaler Inhale 2 puffs into the lungs every 4 (four) hours as  needed. For shortness of breath or wheezing      . amLODipine (NORVASC) 10 MG tablet Take 10 mg by mouth daily.      Marland Kitchen amoxicillin (AMOXIL) 500 MG capsule Take four capsules one hour before dental appointment.  4 capsule  1  . ARIPiprazole (ABILIFY) 5 MG tablet Take 5 mg by mouth daily.        Marland Kitchen aspirin 81 MG EC tablet Take 81 mg by mouth daily.        Marland Kitchen docusate sodium (COLACE) 100 MG capsule Take 100 mg by mouth 2 (two) times daily as needed. For constipation      . furosemide (LASIX) 80 MG tablet Take 1 tablet (80 mg total)  by mouth 2 (two) times daily.  60 tablet  6  . hydrALAZINE (APRESOLINE) 100 MG tablet Take 1 tablet (100 mg total) by mouth 3 (three) times daily.  90 tablet  6  . hydrOXYzine (VISTARIL) 50 MG capsule Take 50 mg by mouth at bedtime.       Marland Kitchen lisinopril (PRINIVIL,ZESTRIL) 20 MG tablet Take 20 mg by mouth daily.      . metoCLOPramide (REGLAN) 10 MG tablet Take 10 mg by mouth 4 (four) times daily -  before meals and at bedtime.      . metoprolol (LOPRESSOR) 50 MG tablet Take 1.5 tablets (75 mg total) by mouth 2 (two) times daily.  90 tablet  6  . omeprazole (PRILOSEC) 40 MG capsule Take 40 mg by mouth 2 (two) times daily as needed. For heart burn      . ondansetron (ZOFRAN ODT) 8 MG disintegrating tablet Take 1 tablet (8 mg total) by mouth every 8 (eight) hours as needed for nausea.  5 tablet  0  . oxyCODONE-acetaminophen (ROXICET) 5-325 MG per tablet Take 1 tablet by mouth every 4 (four) hours as needed for pain.  12 tablet  0    Allergies  Allergen Reactions  . Cashew Nut Oil Anaphylaxis  . Food Anaphylaxis    Strawberries, Cashews, causes throat to swell - affects breathing    Review of Systems No change since his previous exam earlier in October, his renal failure remains stable a creatinine of 3.1  BP 123/84  Pulse 81  Resp 18  Ht 5\' 7"  (1.702 m)  Wt 152 lb (68.947 kg)  BMI 23.81 kg/m2  SpO2 98% Physical Exam Gen. alert anxious accompanied by his wife in no distress HEENT dentition good pupils equal Neck positive for JVD no adenopathy or carotid bruit Thorax well-healed sternal incision scattered rhonchi Cardiac regular rhythm grade 3/6 holosystolic murmur right sternal border and left lower sternal border radiating to left axilla Abdomen mildly distended nontender Extremities nontender Well-perfused no clubbing Vascular positive peripheral pulses Neuro walks with a cane but states it is due to shortness of breath, no focal motor deficit  Diagnostic Tests: 2-D echo, cardiac  cath results reviewed with patient and wife  Impression: Severe symptomatic MR and secondary TR in a patient with chronic renal failure with psychiatric history. He would benefit from mitral replacement and tricuspid valve annuloplasty repair but this would be at increased risk due to his renal failure and prior surgery. He does not appear to be a safe candidate for long-term Coumadin so we will plan on a bioprosthetic valve.  Plan: Redo mitral valve replacement, tricuspid valve repair scheduled for November 5. Indications benefits risks alternatives discussed with patient and wife and he understands and agrees to proceed. He understands  this is at increased risk. He understands he could end up on permanent dialysis, with stroke, or death.

## 2012-09-17 ENCOUNTER — Other Ambulatory Visit: Payer: Self-pay | Admitting: *Deleted

## 2012-09-17 DIAGNOSIS — I071 Rheumatic tricuspid insufficiency: Secondary | ICD-10-CM

## 2012-09-17 DIAGNOSIS — I059 Rheumatic mitral valve disease, unspecified: Secondary | ICD-10-CM

## 2012-09-18 ENCOUNTER — Encounter (HOSPITAL_COMMUNITY): Payer: Self-pay | Admitting: Pharmacy Technician

## 2012-09-19 ENCOUNTER — Ambulatory Visit (HOSPITAL_COMMUNITY)
Admission: RE | Admit: 2012-09-19 | Discharge: 2012-09-19 | Disposition: A | Discharge status: Home / Self Care | Payer: Medicaid Other | Source: Ambulatory Visit | Attending: Cardiothoracic Surgery | Admitting: Cardiothoracic Surgery

## 2012-09-19 ENCOUNTER — Encounter (HOSPITAL_COMMUNITY): Payer: Self-pay

## 2012-09-19 ENCOUNTER — Inpatient Hospital Stay (HOSPITAL_COMMUNITY)
Admission: RE | Admit: 2012-09-19 | Discharge: 2012-09-19 | Disposition: A | Discharge status: Home / Self Care | Payer: Medicaid Other | Source: Ambulatory Visit | Attending: Cardiothoracic Surgery | Admitting: Cardiothoracic Surgery

## 2012-09-19 ENCOUNTER — Encounter (HOSPITAL_COMMUNITY)
Admission: RE | Admit: 2012-09-19 | Discharge: 2012-09-19 | Disposition: A | Discharge status: Home / Self Care | Payer: Medicaid Other | Source: Ambulatory Visit | Attending: Cardiothoracic Surgery | Admitting: Cardiothoracic Surgery

## 2012-09-19 VITALS — BP 107/69 | HR 74 | Temp 97.8°F | Resp 20 | Ht 66.0 in | Wt 151.2 lb

## 2012-09-19 DIAGNOSIS — I059 Rheumatic mitral valve disease, unspecified: Secondary | ICD-10-CM

## 2012-09-19 DIAGNOSIS — Z01810 Encounter for preprocedural cardiovascular examination: Secondary | ICD-10-CM

## 2012-09-19 DIAGNOSIS — I071 Rheumatic tricuspid insufficiency: Secondary | ICD-10-CM

## 2012-09-19 HISTORY — DX: Pruritus, unspecified: L29.9

## 2012-09-19 HISTORY — DX: Dizziness and giddiness: R42

## 2012-09-19 LAB — PULMONARY FUNCTION TEST

## 2012-09-19 LAB — CBC
HCT: 32.4 % — ABNORMAL LOW (ref 39.0–52.0)
Hemoglobin: 10.4 g/dL — ABNORMAL LOW (ref 13.0–17.0)
MCH: 28.3 pg (ref 26.0–34.0)
MCHC: 32.1 g/dL (ref 30.0–36.0)
MCV: 88 fL (ref 78.0–100.0)
Platelets: 148 10*3/uL — ABNORMAL LOW (ref 150–400)
RBC: 3.68 MIL/uL — ABNORMAL LOW (ref 4.22–5.81)
RDW: 19.6 % — ABNORMAL HIGH (ref 11.5–15.5)
WBC: 6.1 10*3/uL (ref 4.0–10.5)

## 2012-09-19 LAB — PROTIME-INR
INR: 1.14 (ref 0.00–1.49)
Prothrombin Time: 14.4 seconds (ref 11.6–15.2)

## 2012-09-19 LAB — URINALYSIS, ROUTINE W REFLEX MICROSCOPIC
Bilirubin Urine: NEGATIVE
Glucose, UA: NEGATIVE mg/dL
Ketones, ur: NEGATIVE mg/dL
Leukocytes, UA: NEGATIVE
Nitrite: NEGATIVE
Protein, ur: NEGATIVE mg/dL
Specific Gravity, Urine: 1.009 (ref 1.005–1.030)
Urobilinogen, UA: 1 mg/dL (ref 0.0–1.0)
pH: 7 (ref 5.0–8.0)

## 2012-09-19 LAB — SURGICAL PCR SCREEN
MRSA, PCR: NEGATIVE
Staphylococcus aureus: NEGATIVE

## 2012-09-19 LAB — BLOOD GAS, ARTERIAL
Acid-base deficit: 0.4 mmol/L (ref 0.0–2.0)
Bicarbonate: 23.2 mEq/L (ref 20.0–24.0)
Drawn by: 344381
FIO2: 0.21 %
O2 Saturation: 97.9 %
Patient temperature: 98.6
TCO2: 24.2 mmol/L (ref 0–100)
pCO2 arterial: 34.1 mmHg — ABNORMAL LOW (ref 35.0–45.0)
pH, Arterial: 7.447 (ref 7.350–7.450)
pO2, Arterial: 95.6 mmHg (ref 80.0–100.0)

## 2012-09-19 LAB — COMPREHENSIVE METABOLIC PANEL
ALT: 28 U/L (ref 0–53)
AST: 51 U/L — ABNORMAL HIGH (ref 0–37)
Albumin: 3.3 g/dL — ABNORMAL LOW (ref 3.5–5.2)
Alkaline Phosphatase: 169 U/L — ABNORMAL HIGH (ref 39–117)
BUN: 35 mg/dL — ABNORMAL HIGH (ref 6–23)
CO2: 21 mEq/L (ref 19–32)
Calcium: 9.2 mg/dL (ref 8.4–10.5)
Chloride: 102 mEq/L (ref 96–112)
Creatinine, Ser: 2.53 mg/dL — ABNORMAL HIGH (ref 0.50–1.35)
GFR calc Af Amer: 32 mL/min — ABNORMAL LOW (ref >90)
GFR calc non Af Amer: 28 mL/min — ABNORMAL LOW (ref >90)
Glucose, Bld: 96 mg/dL (ref 70–99)
Potassium: 3.7 mEq/L (ref 3.5–5.1)
Sodium: 136 mEq/L (ref 135–145)
Total Bilirubin: 1.4 mg/dL — ABNORMAL HIGH (ref 0.3–1.2)
Total Protein: 6.9 g/dL (ref 6.0–8.3)

## 2012-09-19 LAB — APTT: aPTT: 32 seconds (ref 24–37)

## 2012-09-19 LAB — URINE MICROSCOPIC-ADD ON

## 2012-09-19 NOTE — Progress Notes (Signed)
Patient informed Nurse that he had a stress test and cardiac cath. Cardiac cath in [facility] 08/28/12. Echo in [facility] 08/20/12. Patient denied having a sleep study. Patient has a fistula in left arm, but has yet to receive dialysis. Nephrologist is Dr. Arrie Aran at [facility]. LOV requested. Cardiologist is Dr. Tonny Bollman office visits in [facility], and patient goes to [facility] in downtown [place].

## 2012-09-19 NOTE — Progress Notes (Signed)
VASCULAR LAB PRELIMINARY  PRELIMINARY  PRELIMINARY  PRELIMINARY  Pre-op Cardiac Surgery  Carotid Findings:  Bilateral:  No evidence of hemodynamically significant internal carotid artery stenosis.   Vertebral artery flow is antegrade.      Upper Extremity Right Left  Brachial Pressures 132 Triphasic  AV fistula  Radial Waveforms Triphasic    Ulnar Waveforms Triphasic    Palmar Arch (Allen's Test) Within normal limits      Ryan Winters, RVT 09/19/2012, 1:34 PM

## 2012-09-20 LAB — HEMOGLOBIN A1C
Hgb A1c MFr Bld: 4.7 % (ref <5.7)
Mean Plasma Glucose: 88 mg/dL (ref <117)

## 2012-09-22 ENCOUNTER — Other Ambulatory Visit: Payer: Self-pay | Admitting: *Deleted

## 2012-09-22 ENCOUNTER — Encounter (HOSPITAL_COMMUNITY): Payer: Self-pay | Admitting: Surgery

## 2012-09-22 DIAGNOSIS — I059 Rheumatic mitral valve disease, unspecified: Secondary | ICD-10-CM

## 2012-09-22 MED ORDER — DEXTROSE 5 % IV SOLN
750.0000 mg | INTRAVENOUS | Status: DC
  Filled 2012-09-22: qty 750

## 2012-09-22 MED ORDER — NITROGLYCERIN IN D5W 200-5 MCG/ML-% IV SOLN
2.0000 ug/min | INTRAVENOUS | Status: AC
  Administered 2012-09-23: 5 ug/min via INTRAVENOUS
  Filled 2012-09-22: qty 250

## 2012-09-22 MED ORDER — SODIUM CHLORIDE 0.9 % IV SOLN
INTRAVENOUS | Status: AC
  Administered 2012-09-23: 70 mL/h via INTRAVENOUS
  Administered 2012-09-23: 14:00:00 via INTRAVENOUS
  Filled 2012-09-22: qty 40

## 2012-09-22 MED ORDER — POTASSIUM CHLORIDE 2 MEQ/ML IV SOLN
80.0000 meq | INTRAVENOUS | Status: DC
  Filled 2012-09-22: qty 40

## 2012-09-22 MED ORDER — DOPAMINE-DEXTROSE 3.2-5 MG/ML-% IV SOLN
2.0000 ug/kg/min | INTRAVENOUS | Status: AC
  Administered 2012-09-23: 2.5 ug/kg/min via INTRAVENOUS
  Filled 2012-09-22: qty 250

## 2012-09-22 MED ORDER — VANCOMYCIN HCL 1000 MG IV SOLR
1250.0000 mg | INTRAVENOUS | Status: AC
  Administered 2012-09-23: 1250 mg via INTRAVENOUS
  Filled 2012-09-22 (×2): qty 1250

## 2012-09-22 MED ORDER — METOPROLOL TARTRATE 12.5 MG HALF TABLET: 12.5000 mg | ORAL_TABLET | Freq: Once | ORAL | Status: DC

## 2012-09-22 MED ORDER — MAGNESIUM SULFATE 50 % IJ SOLN
40.0000 meq | INTRAMUSCULAR | Status: DC
  Filled 2012-09-22: qty 10

## 2012-09-22 MED ORDER — DEXMEDETOMIDINE HCL IN NACL 400 MCG/100ML IV SOLN
0.1000 ug/kg/h | INTRAVENOUS | Status: AC
  Administered 2012-09-23: 0.2 ug/kg/h via INTRAVENOUS
  Filled 2012-09-22: qty 100

## 2012-09-22 MED ORDER — EPINEPHRINE HCL 1 MG/ML IJ SOLN
0.5000 ug/min | INTRAVENOUS | Status: DC
  Filled 2012-09-22: qty 4

## 2012-09-22 MED ORDER — SODIUM CHLORIDE 0.9 % IV SOLN
INTRAVENOUS | Status: AC
  Administered 2012-09-23: 1.1 [IU]/h via INTRAVENOUS
  Filled 2012-09-22: qty 1

## 2012-09-22 MED ORDER — PHENYLEPHRINE HCL 10 MG/ML IJ SOLN
30.0000 ug/min | INTRAVENOUS | Status: AC
  Administered 2012-09-23: 10 ug/min via INTRAVENOUS
  Filled 2012-09-22: qty 2

## 2012-09-22 MED ORDER — DEXTROSE 5 % IV SOLN
1.5000 g | INTRAVENOUS | Status: AC
  Administered 2012-09-23: .75 g via INTRAVENOUS
  Administered 2012-09-23: 1.5 g via INTRAVENOUS
  Filled 2012-09-22 (×2): qty 1.5

## 2012-09-22 MED ORDER — PLASMA-LYTE 148 IV SOLN
INTRAVENOUS | Status: DC
  Filled 2012-09-22: qty 2.5

## 2012-09-23 ENCOUNTER — Encounter (HOSPITAL_COMMUNITY)
Admission: RE | Disposition: A | Discharge status: Home / Self Care | Payer: Self-pay | Source: Ambulatory Visit | Attending: Cardiothoracic Surgery

## 2012-09-23 ENCOUNTER — Encounter (HOSPITAL_COMMUNITY): Payer: Self-pay | Admitting: *Deleted

## 2012-09-23 ENCOUNTER — Inpatient Hospital Stay (HOSPITAL_COMMUNITY)
Admission: RE | Admit: 2012-09-23 | Discharge: 2012-10-01 | DRG: 219 | Disposition: A | Discharge status: Home / Self Care | Payer: Medicaid Other | Source: Ambulatory Visit | Attending: Cardiothoracic Surgery | Admitting: Cardiothoracic Surgery

## 2012-09-23 ENCOUNTER — Inpatient Hospital Stay (HOSPITAL_COMMUNITY): Payer: Medicaid Other

## 2012-09-23 ENCOUNTER — Encounter (HOSPITAL_COMMUNITY): Payer: Self-pay | Admitting: Certified Registered"

## 2012-09-23 ENCOUNTER — Ambulatory Visit (HOSPITAL_COMMUNITY): Payer: Medicaid Other | Admitting: Certified Registered"

## 2012-09-23 DIAGNOSIS — I079 Rheumatic tricuspid valve disease, unspecified: Secondary | ICD-10-CM

## 2012-09-23 DIAGNOSIS — E872 Acidosis, unspecified: Secondary | ICD-10-CM | POA: Diagnosis present

## 2012-09-23 DIAGNOSIS — D631 Anemia in chronic kidney disease: Secondary | ICD-10-CM | POA: Diagnosis present

## 2012-09-23 DIAGNOSIS — D62 Acute posthemorrhagic anemia: Secondary | ICD-10-CM | POA: Diagnosis present

## 2012-09-23 DIAGNOSIS — F319 Bipolar disorder, unspecified: Secondary | ICD-10-CM | POA: Diagnosis present

## 2012-09-23 DIAGNOSIS — K5900 Constipation, unspecified: Secondary | ICD-10-CM | POA: Diagnosis not present

## 2012-09-23 DIAGNOSIS — Z79899 Other long term (current) drug therapy: Secondary | ICD-10-CM

## 2012-09-23 DIAGNOSIS — Z9889 Other specified postprocedural states: Secondary | ICD-10-CM

## 2012-09-23 DIAGNOSIS — I129 Hypertensive chronic kidney disease with stage 1 through stage 4 chronic kidney disease, or unspecified chronic kidney disease: Secondary | ICD-10-CM | POA: Diagnosis present

## 2012-09-23 DIAGNOSIS — Z954 Presence of other heart-valve replacement: Secondary | ICD-10-CM

## 2012-09-23 DIAGNOSIS — Z7901 Long term (current) use of anticoagulants: Secondary | ICD-10-CM

## 2012-09-23 DIAGNOSIS — I059 Rheumatic mitral valve disease, unspecified: Secondary | ICD-10-CM

## 2012-09-23 DIAGNOSIS — I5033 Acute on chronic diastolic (congestive) heart failure: Secondary | ICD-10-CM | POA: Diagnosis present

## 2012-09-23 DIAGNOSIS — D696 Thrombocytopenia, unspecified: Secondary | ICD-10-CM | POA: Diagnosis not present

## 2012-09-23 DIAGNOSIS — I2510 Atherosclerotic heart disease of native coronary artery without angina pectoris: Secondary | ICD-10-CM | POA: Diagnosis present

## 2012-09-23 DIAGNOSIS — Z7982 Long term (current) use of aspirin: Secondary | ICD-10-CM

## 2012-09-23 DIAGNOSIS — F209 Schizophrenia, unspecified: Secondary | ICD-10-CM | POA: Diagnosis present

## 2012-09-23 DIAGNOSIS — I498 Other specified cardiac arrhythmias: Secondary | ICD-10-CM | POA: Diagnosis not present

## 2012-09-23 DIAGNOSIS — Z952 Presence of prosthetic heart valve: Secondary | ICD-10-CM

## 2012-09-23 DIAGNOSIS — E8779 Other fluid overload: Secondary | ICD-10-CM | POA: Diagnosis present

## 2012-09-23 DIAGNOSIS — F1411 Cocaine abuse, in remission: Secondary | ICD-10-CM | POA: Diagnosis present

## 2012-09-23 DIAGNOSIS — I509 Heart failure, unspecified: Secondary | ICD-10-CM | POA: Diagnosis present

## 2012-09-23 DIAGNOSIS — I4891 Unspecified atrial fibrillation: Secondary | ICD-10-CM | POA: Diagnosis not present

## 2012-09-23 DIAGNOSIS — K219 Gastro-esophageal reflux disease without esophagitis: Secondary | ICD-10-CM | POA: Diagnosis present

## 2012-09-23 DIAGNOSIS — N184 Chronic kidney disease, stage 4 (severe): Secondary | ICD-10-CM | POA: Diagnosis present

## 2012-09-23 DIAGNOSIS — D72829 Elevated white blood cell count, unspecified: Secondary | ICD-10-CM | POA: Diagnosis present

## 2012-09-23 DIAGNOSIS — I071 Rheumatic tricuspid insufficiency: Secondary | ICD-10-CM

## 2012-09-23 HISTORY — PX: TRICUSPID VALVE REPLACEMENT: SHX816

## 2012-09-23 HISTORY — PX: MITRAL VALVE REPLACEMENT: SHX147

## 2012-09-23 LAB — POCT I-STAT 4, (NA,K, GLUC, HGB,HCT)
Glucose, Bld: 96 mg/dL (ref 70–99)
Glucose, Bld: 95 mg/dL (ref 70–99)
Glucose, Bld: 113 mg/dL — ABNORMAL HIGH (ref 70–99)
Glucose, Bld: 98 mg/dL (ref 70–99)
Glucose, Bld: 102 mg/dL — ABNORMAL HIGH (ref 70–99)
Glucose, Bld: 106 mg/dL — ABNORMAL HIGH (ref 70–99)
Glucose, Bld: 103 mg/dL — ABNORMAL HIGH (ref 70–99)
Glucose, Bld: 111 mg/dL — ABNORMAL HIGH (ref 70–99)
Glucose, Bld: 130 mg/dL — ABNORMAL HIGH (ref 70–99)
Glucose, Bld: 91 mg/dL (ref 70–99)
HCT: 30 % — ABNORMAL LOW (ref 39.0–52.0)
HCT: 26 % — ABNORMAL LOW (ref 39.0–52.0)
HCT: 25 % — ABNORMAL LOW (ref 39.0–52.0)
HCT: 29 % — ABNORMAL LOW (ref 39.0–52.0)
HCT: 23 % — ABNORMAL LOW (ref 39.0–52.0)
HCT: 20 % — ABNORMAL LOW (ref 39.0–52.0)
HCT: 22 % — ABNORMAL LOW (ref 39.0–52.0)
HCT: 23 % — ABNORMAL LOW (ref 39.0–52.0)
HCT: 22 % — ABNORMAL LOW (ref 39.0–52.0)
HCT: 30 % — ABNORMAL LOW (ref 39.0–52.0)
Hemoglobin: 10.2 g/dL — ABNORMAL LOW (ref 13.0–17.0)
Hemoglobin: 8.8 g/dL — ABNORMAL LOW (ref 13.0–17.0)
Hemoglobin: 8.5 g/dL — ABNORMAL LOW (ref 13.0–17.0)
Hemoglobin: 9.9 g/dL — ABNORMAL LOW (ref 13.0–17.0)
Hemoglobin: 7.8 g/dL — ABNORMAL LOW (ref 13.0–17.0)
Hemoglobin: 6.8 g/dL — CL (ref 13.0–17.0)
Hemoglobin: 7.5 g/dL — ABNORMAL LOW (ref 13.0–17.0)
Hemoglobin: 7.8 g/dL — ABNORMAL LOW (ref 13.0–17.0)
Hemoglobin: 7.5 g/dL — ABNORMAL LOW (ref 13.0–17.0)
Hemoglobin: 10.2 g/dL — ABNORMAL LOW (ref 13.0–17.0)
Potassium: 3.8 mEq/L (ref 3.5–5.1)
Potassium: 4 mEq/L (ref 3.5–5.1)
Potassium: 4 mEq/L (ref 3.5–5.1)
Potassium: 4.1 mEq/L (ref 3.5–5.1)
Potassium: 4.9 mEq/L (ref 3.5–5.1)
Potassium: 4.2 mEq/L (ref 3.5–5.1)
Potassium: 4.9 mEq/L (ref 3.5–5.1)
Potassium: 5.7 mEq/L — ABNORMAL HIGH (ref 3.5–5.1)
Potassium: 5.5 mEq/L — ABNORMAL HIGH (ref 3.5–5.1)
Potassium: 4.8 mEq/L (ref 3.5–5.1)
Sodium: 139 mEq/L (ref 135–145)
Sodium: 136 mEq/L (ref 135–145)
Sodium: 138 mEq/L (ref 135–145)
Sodium: 139 mEq/L (ref 135–145)
Sodium: 138 mEq/L (ref 135–145)
Sodium: 132 mEq/L — ABNORMAL LOW (ref 135–145)
Sodium: 127 mEq/L — ABNORMAL LOW (ref 135–145)
Sodium: 131 mEq/L — ABNORMAL LOW (ref 135–145)
Sodium: 130 mEq/L — ABNORMAL LOW (ref 135–145)
Sodium: 135 mEq/L (ref 135–145)

## 2012-09-23 LAB — CBC
Hemoglobin: 10.1 g/dL — ABNORMAL LOW (ref 13.0–17.0)
MCHC: 33.7 g/dL (ref 30.0–36.0)
Platelets: 106 10*3/uL — ABNORMAL LOW (ref 150–400)
RDW: 18.2 % — ABNORMAL HIGH (ref 11.5–15.5)

## 2012-09-23 LAB — POCT I-STAT GLUCOSE
Glucose, Bld: 100 mg/dL — ABNORMAL HIGH (ref 70–99)
Operator id: 156951

## 2012-09-23 LAB — POCT I-STAT 3, ART BLOOD GAS (G3+)
Acid-base deficit: 1 mmol/L (ref 0.0–2.0)
Acid-base deficit: 2 mmol/L (ref 0.0–2.0)
Acid-base deficit: 7 mmol/L — ABNORMAL HIGH (ref 0.0–2.0)
Acid-base deficit: 6 mmol/L — ABNORMAL HIGH (ref 0.0–2.0)
Acid-base deficit: 5 mmol/L — ABNORMAL HIGH (ref 0.0–2.0)
Bicarbonate: 25.3 mEq/L — ABNORMAL HIGH (ref 20.0–24.0)
Bicarbonate: 23.9 mEq/L (ref 20.0–24.0)
Bicarbonate: 19.9 mEq/L — ABNORMAL LOW (ref 20.0–24.0)
Bicarbonate: 21.4 mEq/L (ref 20.0–24.0)
Bicarbonate: 20.3 mEq/L (ref 20.0–24.0)
O2 Saturation: 100 %
O2 Saturation: 100 %
O2 Saturation: 100 %
O2 Saturation: 88 %
O2 Saturation: 100 %
Patient temperature: 35.9
Patient temperature: 36.8
TCO2: 27 mmol/L (ref 0–100)
TCO2: 25 mmol/L (ref 0–100)
TCO2: 21 mmol/L (ref 0–100)
TCO2: 23 mmol/L (ref 0–100)
TCO2: 21 mmol/L (ref 0–100)
pCO2 arterial: 47.6 mmHg — ABNORMAL HIGH (ref 35.0–45.0)
pCO2 arterial: 45.9 mmHg — ABNORMAL HIGH (ref 35.0–45.0)
pCO2 arterial: 47.4 mmHg — ABNORMAL HIGH (ref 35.0–45.0)
pCO2 arterial: 45.5 mmHg — ABNORMAL HIGH (ref 35.0–45.0)
pCO2 arterial: 38 mmHg (ref 35.0–45.0)
pH, Arterial: 7.334 — ABNORMAL LOW (ref 7.350–7.450)
pH, Arterial: 7.325 — ABNORMAL LOW (ref 7.350–7.450)
pH, Arterial: 7.23 — ABNORMAL LOW (ref 7.350–7.450)
pH, Arterial: 7.275 — ABNORMAL LOW (ref 7.350–7.450)
pH, Arterial: 7.335 — ABNORMAL LOW (ref 7.350–7.450)
pO2, Arterial: 398 mmHg — ABNORMAL HIGH (ref 80.0–100.0)
pO2, Arterial: 405 mmHg — ABNORMAL HIGH (ref 80.0–100.0)
pO2, Arterial: 213 mmHg — ABNORMAL HIGH (ref 80.0–100.0)
pO2, Arterial: 58 mmHg — ABNORMAL LOW (ref 80.0–100.0)
pO2, Arterial: 184 mmHg — ABNORMAL HIGH (ref 80.0–100.0)

## 2012-09-23 LAB — RAPID URINE DRUG SCREEN, HOSP PERFORMED
Amphetamines: NOT DETECTED
Barbiturates: NOT DETECTED
Benzodiazepines: NOT DETECTED
Cocaine: NOT DETECTED
Opiates: POSITIVE — AB
Tetrahydrocannabinol: NOT DETECTED

## 2012-09-23 LAB — CARBOXYHEMOGLOBIN
Carboxyhemoglobin: 2.1 % — ABNORMAL HIGH (ref 0.5–1.5)
Methemoglobin: 1.5 % (ref 0.0–1.5)
O2 Saturation: 90.3 %
Total hemoglobin: 10.4 g/dL — ABNORMAL LOW (ref 13.5–18.0)

## 2012-09-23 LAB — PROTIME-INR
INR: 1.85 — ABNORMAL HIGH (ref 0.00–1.49)
Prothrombin Time: 20.7 seconds — ABNORMAL HIGH (ref 11.6–15.2)

## 2012-09-23 LAB — DIC (DISSEMINATED INTRAVASCULAR COAGULATION)PANEL
D-Dimer, Quant: 2.37 ug/mL-FEU — ABNORMAL HIGH (ref 0.00–0.48)
Fibrinogen: 183 mg/dL — ABNORMAL LOW (ref 204–475)
INR: 1.98 — ABNORMAL HIGH (ref 0.00–1.49)
Platelets: 102 10*3/uL — ABNORMAL LOW (ref 150–400)
Prothrombin Time: 21.7 seconds — ABNORMAL HIGH (ref 11.6–15.2)
aPTT: 42 seconds — ABNORMAL HIGH (ref 24–37)

## 2012-09-23 LAB — PLATELET COUNT: Platelets: 50 10*3/uL — ABNORMAL LOW (ref 150–400)

## 2012-09-23 LAB — HEMOGLOBIN AND HEMATOCRIT, BLOOD
HCT: 22.5 % — ABNORMAL LOW (ref 39.0–52.0)
Hemoglobin: 7.5 g/dL — ABNORMAL LOW (ref 13.0–17.0)

## 2012-09-23 SURGERY — REPLACEMENT, MITRAL VALVE, REPEAT
Anesthesia: General | Site: Chest | Wound class: Clean

## 2012-09-23 MED ORDER — SODIUM CHLORIDE 0.45 % IV SOLN
INTRAVENOUS | Status: DC
Start: 2012-09-23 — End: 2012-09-25
  Administered 2012-09-23 – 2012-09-25 (×2): via INTRAVENOUS

## 2012-09-23 MED ORDER — PROTAMINE SULFATE 10 MG/ML IV SOLN
INTRAVENOUS | Status: DC | PRN
  Administered 2012-09-23: 25 mg via INTRAVENOUS
  Administered 2012-09-23: 30 mg via INTRAVENOUS
  Administered 2012-09-23: 40 mg via INTRAVENOUS
  Administered 2012-09-23: 10 mg via INTRAVENOUS
  Administered 2012-09-23 (×3): 25 mg via INTRAVENOUS

## 2012-09-23 MED ORDER — SODIUM BICARBONATE 8.4 % IV SOLN
INTRAVENOUS | Status: DC | PRN
  Administered 2012-09-23: 50 meq via INTRAVENOUS

## 2012-09-23 MED ORDER — BISACODYL 5 MG PO TBEC
10.0000 mg | DELAYED_RELEASE_TABLET | Freq: Every day | ORAL | Status: DC
  Administered 2012-09-25 – 2012-09-27 (×3): 10 mg via ORAL
  Filled 2012-09-23 (×3): qty 2

## 2012-09-23 MED ORDER — BISACODYL 10 MG RE SUPP
10.0000 mg | Freq: Every day | RECTAL | Status: DC
  Filled 2012-09-23: qty 1

## 2012-09-23 MED ORDER — LACTATED RINGERS IV SOLN: 500.0000 mL | Freq: Once | INTRAVENOUS | Status: AC | PRN

## 2012-09-23 MED ORDER — SODIUM CHLORIDE 0.9 % IR SOLN
Status: DC | PRN
  Administered 2012-09-23: 1000 mL
  Administered 2012-09-23: 5000 mL

## 2012-09-23 MED ORDER — MILRINONE IN DEXTROSE 20 MG/100ML IV SOLN
0.1250 ug/kg/min | INTRAVENOUS | Status: DC
  Filled 2012-09-23: qty 100

## 2012-09-23 MED ORDER — AMINOCAPROIC ACID 250 MG/ML IV SOLN
INTRAVENOUS | Status: DC
  Filled 2012-09-23: qty 40

## 2012-09-23 MED ORDER — MILRINONE IN DEXTROSE 20 MG/100ML IV SOLN
0.3000 ug/kg/min | INTRAVENOUS | Status: DC
  Administered 2012-09-24: 0.3 ug/kg/min via INTRAVENOUS
  Filled 2012-09-23: qty 100

## 2012-09-23 MED ORDER — SODIUM CHLORIDE 0.9 % IJ SOLN
OROMUCOSAL | Status: DC | PRN
  Administered 2012-09-23 (×4): via TOPICAL

## 2012-09-23 MED ORDER — DOPAMINE-DEXTROSE 3.2-5 MG/ML-% IV SOLN
3.0000 ug/kg/min | INTRAVENOUS | Status: DC
  Administered 2012-09-25: 3 ug/kg/min via INTRAVENOUS
  Filled 2012-09-23 (×2): qty 250

## 2012-09-23 MED ORDER — DOCUSATE SODIUM 100 MG PO CAPS
200.0000 mg | ORAL_CAPSULE | Freq: Every day | ORAL | Status: DC
  Administered 2012-09-25 – 2012-09-27 (×3): 200 mg via ORAL
  Filled 2012-09-23 (×3): qty 2

## 2012-09-23 MED ORDER — INSULIN ASPART 100 UNIT/ML ~~LOC~~ SOLN
0.0000 [IU] | SUBCUTANEOUS | Status: AC
  Administered 2012-09-24: 2 [IU] via SUBCUTANEOUS

## 2012-09-23 MED ORDER — SODIUM BICARBONATE 8.4 % IV SOLN
50.0000 meq | Freq: Once | INTRAVENOUS | Status: AC
  Administered 2012-09-23: 50 meq via INTRAVENOUS
  Filled 2012-09-23: qty 50

## 2012-09-23 MED ORDER — OXYCODONE HCL 5 MG PO TABS
5.0000 mg | ORAL_TABLET | ORAL | Status: DC | PRN
  Administered 2012-09-24 – 2012-10-01 (×31): 10 mg via ORAL
  Filled 2012-09-23 (×31): qty 2

## 2012-09-23 MED ORDER — ONDANSETRON HCL 4 MG/2ML IJ SOLN
4.0000 mg | Freq: Four times a day (QID) | INTRAMUSCULAR | Status: DC | PRN
  Administered 2012-09-25 – 2012-09-26 (×2): 4 mg via INTRAVENOUS
  Filled 2012-09-23 (×2): qty 2

## 2012-09-23 MED ORDER — INSULIN REGULAR HUMAN 100 UNIT/ML IJ SOLN
INTRAMUSCULAR | Status: DC
  Filled 2012-09-23: qty 1

## 2012-09-23 MED ORDER — MILRINONE IN DEXTROSE 20 MG/100ML IV SOLN
INTRAVENOUS | Status: DC | PRN
  Administered 2012-09-23: .3 ug/kg/min via INTRAVENOUS

## 2012-09-23 MED ORDER — ASPIRIN 81 MG PO CHEW
324.0000 mg | CHEWABLE_TABLET | Freq: Every day | ORAL | Status: DC
  Administered 2012-09-24: 324 mg
  Filled 2012-09-23: qty 1
  Filled 2012-09-23: qty 3

## 2012-09-23 MED ORDER — PROPOFOL 10 MG/ML IV BOLUS
INTRAVENOUS | Status: DC | PRN
  Administered 2012-09-23: 130 mg via INTRAVENOUS

## 2012-09-23 MED ORDER — ASPIRIN EC 325 MG PO TBEC
325.0000 mg | DELAYED_RELEASE_TABLET | Freq: Every day | ORAL | Status: DC
  Administered 2012-09-25 – 2012-09-27 (×3): 325 mg via ORAL
  Filled 2012-09-23 (×4): qty 1

## 2012-09-23 MED ORDER — ALBUMIN HUMAN 5 % IV SOLN
250.0000 mL | INTRAVENOUS | Status: AC | PRN
  Administered 2012-09-24: 250 mL via INTRAVENOUS

## 2012-09-23 MED ORDER — DEXTROSE 5 % IV SOLN
1.5000 g | Freq: Two times a day (BID) | INTRAVENOUS | Status: AC
  Administered 2012-09-24 – 2012-09-25 (×4): 1.5 g via INTRAVENOUS
  Filled 2012-09-23 (×5): qty 1.5

## 2012-09-23 MED ORDER — MORPHINE SULFATE 2 MG/ML IJ SOLN: 1.0000 mg | INTRAMUSCULAR | Status: AC | PRN

## 2012-09-23 MED ORDER — LACTATED RINGERS IV SOLN
INTRAVENOUS | Status: DC | PRN
  Administered 2012-09-23 (×3): via INTRAVENOUS

## 2012-09-23 MED ORDER — SODIUM CHLORIDE 0.9 % IV SOLN: INTRAVENOUS | Status: DC

## 2012-09-23 MED ORDER — CHLORHEXIDINE GLUCONATE 4 % EX LIQD: 30.0000 mL | CUTANEOUS | Status: DC

## 2012-09-23 MED ORDER — ALBUTEROL SULFATE HFA 108 (90 BASE) MCG/ACT IN AERS
INHALATION_SPRAY | RESPIRATORY_TRACT | Status: DC | PRN
  Administered 2012-09-23 (×2): 2 via RESPIRATORY_TRACT

## 2012-09-23 MED ORDER — FAMOTIDINE IN NACL 20-0.9 MG/50ML-% IV SOLN
20.0000 mg | Freq: Two times a day (BID) | INTRAVENOUS | Status: AC
  Administered 2012-09-23 – 2012-09-24 (×2): 20 mg via INTRAVENOUS
  Filled 2012-09-23: qty 50

## 2012-09-23 MED ORDER — SODIUM CHLORIDE 0.9 % IV SOLN
INTRAVENOUS | Status: DC
  Administered 2012-09-24: 05:00:00 via INTRAVENOUS
  Administered 2012-09-24: 20 mL via INTRAVENOUS

## 2012-09-23 MED ORDER — MIDAZOLAM HCL 5 MG/5ML IJ SOLN
INTRAMUSCULAR | Status: DC | PRN
  Administered 2012-09-23 (×3): 3 mg via INTRAVENOUS
  Administered 2012-09-23 (×4): 2 mg via INTRAVENOUS

## 2012-09-23 MED ORDER — METOPROLOL TARTRATE 12.5 MG HALF TABLET
12.5000 mg | ORAL_TABLET | Freq: Two times a day (BID) | ORAL | Status: DC
  Administered 2012-09-26 – 2012-09-28 (×5): 12.5 mg via ORAL
  Filled 2012-09-23 (×13): qty 1

## 2012-09-23 MED ORDER — ACETAMINOPHEN 500 MG PO TABS
1000.0000 mg | ORAL_TABLET | Freq: Four times a day (QID) | ORAL | Status: DC
  Administered 2012-09-24 – 2012-09-27 (×12): 1000 mg via ORAL
  Filled 2012-09-23 (×20): qty 2

## 2012-09-23 MED ORDER — INSULIN ASPART 100 UNIT/ML ~~LOC~~ SOLN
0.0000 [IU] | SUBCUTANEOUS | Status: DC
  Administered 2012-09-24 (×5): 2 [IU] via SUBCUTANEOUS
  Administered 2012-09-25: 01:00:00 via SUBCUTANEOUS
  Administered 2012-09-25 (×2): 2 [IU] via SUBCUTANEOUS

## 2012-09-23 MED ORDER — SODIUM CHLORIDE 0.9 % IV SOLN
20.0000 ug | Freq: Once | INTRAVENOUS | Status: AC
  Administered 2012-09-23: 20 ug via INTRAVENOUS
  Filled 2012-09-23: qty 5

## 2012-09-23 MED ORDER — CALCIUM CHLORIDE 10 % IV SOLN
INTRAVENOUS | Status: DC | PRN
  Administered 2012-09-23 (×2): 50 mg via INTRAVENOUS

## 2012-09-23 MED ORDER — ACETAMINOPHEN 160 MG/5ML PO SOLN
975.0000 mg | Freq: Four times a day (QID) | ORAL | Status: DC
  Administered 2012-09-24 (×2): 975 mg
  Filled 2012-09-23 (×2): qty 40.6

## 2012-09-23 MED ORDER — HEPARIN SODIUM (PORCINE) 1000 UNIT/ML IJ SOLN
INTRAMUSCULAR | Status: DC | PRN
  Administered 2012-09-23: 18000 [IU] via INTRAVENOUS

## 2012-09-23 MED ORDER — SODIUM CHLORIDE 0.9 % IV SOLN
250.0000 mL | INTRAVENOUS | Status: DC
  Administered 2012-09-24: 250 mL via INTRAVENOUS

## 2012-09-23 MED ORDER — EPINEPHRINE HCL 1 MG/ML IJ SOLN
1000.0000 ug | INTRAVENOUS | Status: DC | PRN
  Administered 2012-09-23: 2 ug/min via INTRAVENOUS

## 2012-09-23 MED ORDER — VANCOMYCIN HCL IN DEXTROSE 1-5 GM/200ML-% IV SOLN
1000.0000 mg | INTRAVENOUS | Status: DC
Start: 2012-09-24 — End: 2012-09-24
  Administered 2012-09-24: 1000 mg via INTRAVENOUS
  Filled 2012-09-23 (×2): qty 200

## 2012-09-23 MED ORDER — FENTANYL CITRATE 0.05 MG/ML IJ SOLN
INTRAMUSCULAR | Status: DC | PRN
  Administered 2012-09-23: 100 ug via INTRAVENOUS
  Administered 2012-09-23: 150 ug via INTRAVENOUS
  Administered 2012-09-23 (×4): 250 ug via INTRAVENOUS

## 2012-09-23 MED ORDER — METOPROLOL TARTRATE 25 MG/10 ML ORAL SUSPENSION
12.5000 mg | Freq: Two times a day (BID) | ORAL | Status: DC
Start: 2012-09-23 — End: 2012-09-25
  Filled 2012-09-23 (×5): qty 5

## 2012-09-23 MED ORDER — HEMOSTATIC AGENTS (NO CHARGE) OPTIME
TOPICAL | Status: DC | PRN
  Administered 2012-09-23: 1 via TOPICAL

## 2012-09-23 MED ORDER — NITROGLYCERIN IN D5W 200-5 MCG/ML-% IV SOLN: 0.0000 ug/min | INTRAVENOUS | Status: DC

## 2012-09-23 MED ORDER — PANTOPRAZOLE SODIUM 40 MG PO TBEC
40.0000 mg | DELAYED_RELEASE_TABLET | Freq: Every day | ORAL | Status: DC
  Administered 2012-09-25 – 2012-09-30 (×6): 40 mg via ORAL
  Filled 2012-09-23 (×6): qty 1

## 2012-09-23 MED ORDER — VECURONIUM BROMIDE 10 MG IV SOLR
INTRAVENOUS | Status: DC | PRN
  Administered 2012-09-23 (×2): 5 mg via INTRAVENOUS

## 2012-09-23 MED ORDER — SODIUM CHLORIDE 0.9 % IV SOLN
INTRAVENOUS | Status: DC | PRN
  Administered 2012-09-23: 11:00:00 via INTRAVENOUS

## 2012-09-23 MED ORDER — POTASSIUM CHLORIDE 10 MEQ/50ML IV SOLN: 10.0000 meq | INTRAVENOUS | Status: AC

## 2012-09-23 MED ORDER — MAGNESIUM SULFATE 40 MG/ML IJ SOLN
4.0000 g | Freq: Once | INTRAMUSCULAR | Status: AC
  Administered 2012-09-23: 4 g via INTRAVENOUS
  Filled 2012-09-23: qty 100

## 2012-09-23 MED ORDER — INSULIN REGULAR BOLUS VIA INFUSION
0.0000 [IU] | Freq: Three times a day (TID) | INTRAVENOUS | Status: DC
  Filled 2012-09-23: qty 10

## 2012-09-23 MED ORDER — SODIUM CHLORIDE 0.9 % IJ SOLN
3.0000 mL | Freq: Two times a day (BID) | INTRAMUSCULAR | Status: DC
  Administered 2012-09-24 – 2012-09-27 (×4): 3 mL via INTRAVENOUS

## 2012-09-23 MED ORDER — VANCOMYCIN HCL IN DEXTROSE 1-5 GM/200ML-% IV SOLN: 1000.0000 mg | Freq: Once | INTRAVENOUS | Status: DC

## 2012-09-23 MED ORDER — PHENYLEPHRINE HCL 10 MG/ML IJ SOLN
0.0000 ug/min | INTRAVENOUS | Status: DC
  Administered 2012-09-24: 30 ug/min via INTRAVENOUS
  Administered 2012-09-24: 20 ug/min via INTRAVENOUS
  Administered 2012-09-24 – 2012-09-25 (×2): 30 ug/min via INTRAVENOUS
  Filled 2012-09-23 (×3): qty 2

## 2012-09-23 MED ORDER — LEVALBUTEROL HCL 1.25 MG/0.5ML IN NEBU
1.2500 mg | INHALATION_SOLUTION | Freq: Three times a day (TID) | RESPIRATORY_TRACT | Status: DC
  Administered 2012-09-23 – 2012-09-27 (×12): 1.25 mg via RESPIRATORY_TRACT
  Filled 2012-09-23 (×15): qty 0.5

## 2012-09-23 MED ORDER — MIDAZOLAM HCL 2 MG/2ML IJ SOLN
2.0000 mg | INTRAMUSCULAR | Status: DC | PRN
Start: 2012-09-23 — End: 2012-09-24
  Administered 2012-09-24: 1 mg via INTRAVENOUS
  Administered 2012-09-24 (×3): 2 mg via INTRAVENOUS
  Filled 2012-09-23 (×4): qty 2

## 2012-09-23 MED ORDER — DEXMEDETOMIDINE HCL IN NACL 200 MCG/50ML IV SOLN
0.1000 ug/kg/h | INTRAVENOUS | Status: DC
  Administered 2012-09-23 – 2012-09-24 (×5): 0.7 ug/kg/h via INTRAVENOUS
  Filled 2012-09-23 (×5): qty 50

## 2012-09-23 MED ORDER — ARTIFICIAL TEARS OP OINT
TOPICAL_OINTMENT | OPHTHALMIC | Status: DC | PRN
  Administered 2012-09-23: 1 via OPHTHALMIC

## 2012-09-23 MED ORDER — PROTAMINE SULFATE 10 MG/ML IV SOLN
25.0000 mg | Freq: Once | INTRAVENOUS | Status: AC
  Administered 2012-09-23: 25 mg via INTRAVENOUS
  Filled 2012-09-23: qty 5

## 2012-09-23 MED ORDER — LACTATED RINGERS IV SOLN: INTRAVENOUS | Status: DC

## 2012-09-23 MED ORDER — MORPHINE SULFATE 2 MG/ML IJ SOLN
2.0000 mg | INTRAMUSCULAR | Status: DC | PRN
  Administered 2012-09-24 (×4): 2 mg via INTRAVENOUS
  Administered 2012-09-24 (×2): 4 mg via INTRAVENOUS
  Administered 2012-09-24: 2 mg via INTRAVENOUS
  Administered 2012-09-25 (×2): 4 mg via INTRAVENOUS
  Administered 2012-09-25 – 2012-09-27 (×2): 2 mg via INTRAVENOUS
  Filled 2012-09-23 (×2): qty 1
  Filled 2012-09-23 (×3): qty 2
  Filled 2012-09-23: qty 1
  Filled 2012-09-23 (×3): qty 2

## 2012-09-23 MED ORDER — SODIUM CHLORIDE 0.9 % IJ SOLN: 3.0000 mL | INTRAMUSCULAR | Status: DC | PRN

## 2012-09-23 MED ORDER — ACETAMINOPHEN 10 MG/ML IV SOLN
1000.0000 mg | Freq: Once | INTRAVENOUS | Status: AC
  Administered 2012-09-23: 1000 mg via INTRAVENOUS
  Filled 2012-09-23: qty 100

## 2012-09-23 MED ORDER — METOPROLOL TARTRATE 1 MG/ML IV SOLN: 2.5000 mg | INTRAVENOUS | Status: DC | PRN

## 2012-09-23 MED ORDER — ROCURONIUM BROMIDE 100 MG/10ML IV SOLN
INTRAVENOUS | Status: DC | PRN
  Administered 2012-09-23 (×2): 50 mg via INTRAVENOUS
  Administered 2012-09-23: 100 mg via INTRAVENOUS

## 2012-09-23 SURGICAL SUPPLY — 116 items
ADAPTER CARDIO PERF ANTE/RETRO (ADAPTER) ×3 IMPLANT
ADPR PRFSN 84XANTGRD RTRGD (ADAPTER) ×1
APPLICATOR COTTON TIP 6IN STRL (MISCELLANEOUS) ×3 IMPLANT
ATTRACTOMAT 16X20 MAGNETIC DRP (DRAPES) ×3 IMPLANT
BAG DECANTER FOR FLEXI CONT (MISCELLANEOUS) ×3 IMPLANT
BLADE CORE FAN STRYKER (BLADE) ×6 IMPLANT
BLADE STERNUM SYSTEM 6 (BLADE) ×3 IMPLANT
BLADE SURG 12 STRL SS (BLADE) IMPLANT
BLADE SURG 15 STRL LF DISP TIS (BLADE) ×1 IMPLANT
BLADE SURG 15 STRL SS (BLADE) ×9
BLOOD HAEMOCONCENTR 700 MIDI (MISCELLANEOUS) ×2 IMPLANT
BOOT SUTURE AID YELLOW STND (SUTURE) ×1 IMPLANT
CANISTER SUCTION 2500CC (MISCELLANEOUS) ×3 IMPLANT
CANN PRFSN 3/8X14X24FR PCFC (MISCELLANEOUS) ×1
CANN PRFSN 3/8XCNCT ST RT ANG (MISCELLANEOUS) ×1
CANN PRFSN 3/8XRT ANG TPR 14 (MISCELLANEOUS) ×2
CANNULA AORTIC ROOT 20012 (MISCELLANEOUS) ×3 IMPLANT
CANNULA GUNDRY RCSP 15FR (MISCELLANEOUS) ×3 IMPLANT
CANNULA PRFSN 3/8X14X24FR PCFC (MISCELLANEOUS) IMPLANT
CANNULA PRFSN 3/8XCNCT RT ANG (MISCELLANEOUS) IMPLANT
CANNULA PRFSN 3/8XRT ANG TPR14 (MISCELLANEOUS) IMPLANT
CANNULA SOFTFLOW AORTIC 7M21FR (CANNULA) ×2 IMPLANT
CANNULA VEN MTL TIP RT (MISCELLANEOUS) ×12
CATH RETROPLEGIA CORONARY 14FR (CATHETERS) ×3 IMPLANT
CATH ROBINSON RED A/P 18FR (CATHETERS) ×19 IMPLANT
CATH THORACIC 36FR (CATHETERS) ×3 IMPLANT
CATH THORACIC 36FR RT ANG (CATHETERS) ×3 IMPLANT
CLIP FOGARTY SPRING 6M (CLIP) ×3 IMPLANT
CLOTH BEACON ORANGE TIMEOUT ST (SAFETY) ×3 IMPLANT
CONN 1/2X1/2X1/2  BEN (MISCELLANEOUS) ×4
CONN 1/2X1/2X1/2 BEN (MISCELLANEOUS) ×1 IMPLANT
CONN 3/8X1/2 ST GISH (MISCELLANEOUS) ×8 IMPLANT
COVER SURGICAL LIGHT HANDLE (MISCELLANEOUS) ×4 IMPLANT
CRADLE DONUT ADULT HEAD (MISCELLANEOUS) ×3 IMPLANT
DRAPE CARDIOVASCULAR INCISE (DRAPES) ×3
DRAPE SLUSH MACHINE 52X66 (DRAPES) IMPLANT
DRAPE SLUSH/WARMER DISC (DRAPES) ×2 IMPLANT
DRAPE SRG 135X102X78XABS (DRAPES) ×1 IMPLANT
DRSG COVADERM 4X14 (GAUZE/BANDAGES/DRESSINGS) ×3 IMPLANT
ELECT CAUTERY BLADE 6.4 (BLADE) ×3 IMPLANT
ELECT REM PT RETURN 9FT ADLT (ELECTROSURGICAL) ×6
ELECTRODE REM PT RTRN 9FT ADLT (ELECTROSURGICAL) ×2 IMPLANT
GAUZE XEROFORM 1X8 LF (GAUZE/BANDAGES/DRESSINGS) ×2 IMPLANT
GLOVE BIO SURGEON STRL SZ 6 (GLOVE) ×7 IMPLANT
GLOVE BIO SURGEON STRL SZ 6.5 (GLOVE) ×11 IMPLANT
GLOVE BIO SURGEON STRL SZ7 (GLOVE) ×7 IMPLANT
GLOVE BIO SURGEONS STRL SZ 6.5 (GLOVE) ×10
GLOVE BIOGEL PI IND STRL 6.5 (GLOVE) IMPLANT
GLOVE BIOGEL PI INDICATOR 6.5 (GLOVE) ×12
GLOVE EUDERMIC 7 POWDERFREE (GLOVE) ×2 IMPLANT
GOWN PREVENTION PLUS XLARGE (GOWN DISPOSABLE) ×5 IMPLANT
GOWN STRL NON-REIN LRG LVL3 (GOWN DISPOSABLE) ×16 IMPLANT
HEMOSTAT POWDER SURGIFOAM 1G (HEMOSTASIS) ×11 IMPLANT
HEMOSTAT SURGICEL 2X14 (HEMOSTASIS) ×3 IMPLANT
INSERT FOGARTY XLG (MISCELLANEOUS) ×2 IMPLANT
KIT BASIN OR (CUSTOM PROCEDURE TRAY) ×3 IMPLANT
KIT CATH CPB BARTLE (MISCELLANEOUS) ×3 IMPLANT
KIT PAIN CUSTOM (MISCELLANEOUS) ×3 IMPLANT
KIT ROOM TURNOVER OR (KITS) ×3 IMPLANT
KIT SUCTION CATH 14FR (SUCTIONS) ×3 IMPLANT
LINE VENT (MISCELLANEOUS) ×2 IMPLANT
LOOP VESSEL SUPERMAXI WHITE (MISCELLANEOUS) ×3 IMPLANT
NS IRRIG 1000ML POUR BTL (IV SOLUTION) ×15 IMPLANT
PACK OPEN HEART (CUSTOM PROCEDURE TRAY) ×3 IMPLANT
PAD ARMBOARD 7.5X6 YLW CONV (MISCELLANEOUS) ×6 IMPLANT
PEDIATRIC SUCKERS (MISCELLANEOUS) ×3 IMPLANT
SET CARDIOPLEGIA MPS 5001102 (MISCELLANEOUS) ×2 IMPLANT
SPONGE GAUZE 4X4 12PLY (GAUZE/BANDAGES/DRESSINGS) ×6 IMPLANT
SPONGE LAP 18X18 X RAY DECT (DISPOSABLE) ×4 IMPLANT
STOPCOCK 4 WAY LG BORE MALE ST (IV SETS) ×2 IMPLANT
SUCKER INTRACARDIAC WEIGHTED (SUCKER) ×2 IMPLANT
SUCKER WEIGHTED FLEX (MISCELLANEOUS) ×3 IMPLANT
SURGIFLO TRUKIT (HEMOSTASIS) ×2 IMPLANT
SUT BONE WAX W31G (SUTURE) ×3 IMPLANT
SUT ETHIBON 2 0 V 52N 30 (SUTURE) ×2 IMPLANT
SUT ETHIBOND 2 0 SH (SUTURE) ×15 IMPLANT
SUT ETHIBOND 2 0 SH 36X2 (SUTURE) ×1 IMPLANT
SUT ETHIBOND 2 0 V4 (SUTURE) IMPLANT
SUT ETHIBOND 2 0V4 GREEN (SUTURE) IMPLANT
SUT ETHIBOND 4 0 TF (SUTURE) ×3 IMPLANT
SUT ETHIBOND 5 0 C 1 30 (SUTURE) ×3 IMPLANT
SUT PROLENE 3 0 SH 1 (SUTURE) ×3 IMPLANT
SUT PROLENE 3 0 SH DA (SUTURE) ×7 IMPLANT
SUT PROLENE 3 0 SH1 36 (SUTURE) ×11 IMPLANT
SUT PROLENE 4 0 RB 1 (SUTURE) ×33
SUT PROLENE 4 0 SH DA (SUTURE) ×6 IMPLANT
SUT PROLENE 4-0 RB1 .5 CRCL 36 (SUTURE) ×2 IMPLANT
SUT PROLENE 5 0 C 1 36 (SUTURE) ×3 IMPLANT
SUT PROLENE 6 0 C 1 30 (SUTURE) ×12 IMPLANT
SUT SILK  1 MH (SUTURE) ×2
SUT SILK 1 MH (SUTURE) ×1 IMPLANT
SUT SILK 1 TIES 10X30 (SUTURE) ×3 IMPLANT
SUT SILK 2 0 (SUTURE) ×3
SUT SILK 2 0 SH CR/8 (SUTURE) ×7 IMPLANT
SUT SILK 2-0 18XBRD TIE 12 (SUTURE) ×1 IMPLANT
SUT SILK 3 0 SH CR/8 (SUTURE) ×3 IMPLANT
SUT SILK 4 0 (SUTURE) ×3
SUT SILK 4-0 18XBRD TIE 12 (SUTURE) ×1 IMPLANT
SUT STEEL 6MS V (SUTURE) ×5 IMPLANT
SUT TEM PAC WIRE 2 0 SH (SUTURE) ×3 IMPLANT
SUT VIC AB 1 CTX 18 (SUTURE) ×6 IMPLANT
SUT VIC AB 1 CTX 36 (SUTURE) ×6
SUT VIC AB 1 CTX36XBRD ANBCTR (SUTURE) ×2 IMPLANT
SUT VIC AB 2-0 CTX 27 (SUTURE) ×3 IMPLANT
SUT VIC AB 3-0 X1 27 (SUTURE) ×3 IMPLANT
SYSTEM ANNULOPLASTY MC3 (Orthopedic Implant) ×2 IMPLANT
SYSTEM SAHARA CHEST DRAIN ATS (WOUND CARE) ×3 IMPLANT
TOWEL OR 17X24 6PK STRL BLUE (TOWEL DISPOSABLE) ×3 IMPLANT
TOWEL OR 17X26 10 PK STRL BLUE (TOWEL DISPOSABLE) ×3 IMPLANT
TRAY CATH LUMEN 1 20CM STRL (SET/KITS/TRAYS/PACK) ×4 IMPLANT
TRAY FOLEY IC TEMP SENS 14FR (CATHETERS) ×3 IMPLANT
TUBE SUCT INTRACARD DLP 20F (MISCELLANEOUS) ×3 IMPLANT
TUBING ART PRESS 48 MALE/FEM (TUBING) ×6 IMPLANT
UNDERPAD 30X30 INCONTINENT (UNDERPADS AND DIAPERS) ×3 IMPLANT
VALVE MITRAL MAGNA 27 (Prosthesis & Implant Heart) ×2 IMPLANT
WATER STERILE IRR 1000ML POUR (IV SOLUTION) ×6 IMPLANT

## 2012-09-23 NOTE — Anesthesia Postprocedure Evaluation (Signed)
  Anesthesia Post-op Note  [name]  Procedure(s) Performed: Procedure(s) (LRB) with comments: REDO MITRAL VALVE REPLACEMENT (MVR) (N/A) - Nitric Oxide TRICUSPID VALVE REPAIR (N/A)  Patient Location: SICU  Anesthesia Type:General  Level of Consciousness: sedated and Patient remains intubated per anesthesia plan  Airway and Oxygen Therapy: Patient remains intubated per anesthesia plan and Patient placed on Ventilator (see vital sign flow sheet for setting)  Post-op Pain: none  Post-op Assessment: Post-op Vital signs reviewed  Post-op Vital Signs: Reviewed  Complications: No apparent anesthesia complications

## 2012-09-23 NOTE — Brief Op Note (Signed)
                   [address]            [place] [address]          [phone]    09/23/2012  3:09 PM  PATIENT:  Ryan Winters  51 y.o. male  PRE-OPERATIVE DIAGNOSIS:  Mitral Regurgitation  POST-OPERATIVE DIAGNOSIS:  Mitral Regurgitation  PROCEDURE:  Procedure(s): REDO MITRAL VALVE REPLACEMENT (MVR)#27 Magna ease mitral bioprosthetic TRICUSPID VALVE REPAIR #28 MC3 ring annuloplasty  SURGEON:  Surgeon(s): [name], MD  PHYSICIAN ASSISTANT: Gershon Crane PA-C  ANESTHESIA:   general  PATIENT CONDITION:  ICU - intubated and hemodynamically stable.  PRE-OPERATIVE WEIGHT: 68kg  Complications: No known

## 2012-09-23 NOTE — Transfer of Care (Signed)
Immediate Anesthesia Transfer of Care Note  [name]  Procedure(s) Performed: Procedure(s) (LRB) with comments: REDO MITRAL VALVE REPLACEMENT (MVR) (N/A) - Nitric Oxide TRICUSPID VALVE REPAIR (N/A)  Patient Location: SICU  Anesthesia Type:General  Level of Consciousness: sedated and Patient remains intubated per anesthesia plan  Airway & Oxygen Therapy: Patient remains intubated per anesthesia plan and Patient placed on Ventilator (see vital sign flow sheet for setting)  Post-op Assessment: Report given to PACU RN  Post vital signs: Reviewed and stable  Complications: No apparent anesthesia complications

## 2012-09-23 NOTE — Anesthesia Preprocedure Evaluation (Addendum)
Anesthesia Evaluation  Patient identified by MRN, date of birth, ID band Patient awake    Reviewed: Allergy & Precautions, H&P , NPO status , reviewed documented beta blocker date and time   Airway Mallampati: I TM Distance: >3 FB Neck ROM: Full    Dental  (+) Teeth Intact and Dental Advisory Given   Pulmonary shortness of breath, with exertion and at rest,  breath sounds clear to auscultation        Cardiovascular hypertension, Pt. on home beta blockers +CHF + Valvular Problems/Murmurs MR Rhythm:Regular Rate:Normal + Systolic murmurs    Neuro/Psych  Headaches, Anxiety Depression    GI/Hepatic GERD-  Medicated and Controlled,  Endo/Other    Renal/GU Renal disease     Musculoskeletal   Abdominal   Peds  Hematology   Anesthesia Other Findings   Reproductive/Obstetrics                          Anesthesia Physical Anesthesia Plan  ASA: IV  Anesthesia Plan: General   Post-op Pain Management:    Induction: Intravenous  Airway Management Planned: Oral ETT  Additional Equipment: Arterial line, PA Cath, 3D TEE and Ultrasound Guidance Line Placement  Intra-op Plan:   Post-operative Plan: Post-operative intubation/ventilation  Informed Consent: I have reviewed the patients History and Physical, chart, labs and discussed the procedure including the risks, benefits and alternatives for the proposed anesthesia with the patient or authorized representative who has indicated his/her understanding and acceptance.   Dental advisory given  Plan Discussed with: CRNA, Anesthesiologist and Surgeon  Anesthesia Plan Comments:         Anesthesia Quick Evaluation

## 2012-09-23 NOTE — Progress Notes (Signed)
ANTIBIOTIC CONSULT NOTE - INITIAL  Pharmacy Consult for vancomycin/zinacef Indication: Post-op antibiotics for prophylaxis  Allergies  Allergen Reactions  . Cashew Nut Oil Anaphylaxis  . Food Anaphylaxis    Strawberries, Cashews, causes throat to swell - affects breathing    Patient Measurements: Weight: 151 lb (68.493 kg)   Vital Signs: Temp: 96.6 F (35.9 C) (11/05 1800) Temp src: Core (Comment) (11/05 1800) BP: 82/50 mmHg (11/05 1800) Pulse Rate: 90  (11/05 1800) Intake/Output from previous day:   Intake/Output from this shift: Total I/O In: 5651 [I.V.:3400; Blood:2251] Out: 390 [Urine:390]  Labs:  Basename 09/23/12 1749 09/23/12 1608 09/23/12 1510 09/23/12 1509  WBC 15.6* -- -- --  HGB 10.1* 7.5* 7.8* --  PLT PENDING -- -- 50*  LABCREA -- -- -- --  CREATININE -- -- -- --   The CrCl is unknown because both a height and weight (above a minimum accepted value) are required for this calculation. No results found for this basename: VANCOTROUGH:2,VANCOPEAK:2,VANCORANDOM:2,GENTTROUGH:2,GENTPEAK:2,GENTRANDOM:2,TOBRATROUGH:2,TOBRAPEAK:2,TOBRARND:2,AMIKACINPEAK:2,AMIKACINTROU:2,AMIKACIN:2, in the last 72 hours   Microbiology: Recent Results (from the past 720 hour(s))  SURGICAL PCR SCREEN     Status: Normal   Collection Time   09/19/12  3:10 PM      Component Value Range Status Comment   MRSA, PCR NEGATIVE  NEGATIVE Final    Staphylococcus aureus NEGATIVE  NEGATIVE Final     Medical History: Past Medical History  Diagnosis Date  . Hypertension   . Bipolar disorder   . Schizophrenia     h/o hospitalized inpt therapy @ the [facility]  . 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   Marland Kitchen Hyperlipidemia   . H/O: suicide attempt 1998    was hospitalized at [facility]  . Anemia     Hx of iron infusions  .  Chronic diastolic heart failure     Hx of Class IV CHF  . Gastroparesis   . Generalized headaches     sinus  . Arthritis   . Cocaine abuse   . Anxiety   . CHF (congestive heart failure)   . GERD (gastroesophageal reflux disease)   . Heart murmur   . Shortness of breath     with ambulation and sometimes sitting  . Depression   . Vertigo   . Constipation   . Itchy skin   . Chronic kidney disease     has fistula, not on HD   Assessment: 51 year old male presents to [facility] for redo MVR and tricuspid valve repair. The patient has CKD with fistula but not currently on HD. His scr pre-op was 2.5 giving him a clearance of ~35. The zinacef will not need dose adjusting but will decrease the vancomycin to 1g q24 x2 doses to provide 48 hours of coverage.  Plan:  Follow scr in am - if significantly elevated will need to discontinue 2nd vanc dose  Severiano Gilbert 09/23/2012,6:24 PM

## 2012-09-23 NOTE — Preoperative (Signed)
Beta Blockers   Reason not to administer Beta Blockers:Not Applicable, last dose 09/23/12 at 04:20

## 2012-09-23 NOTE — Progress Notes (Signed)
  Echocardiogram Echocardiogram Transesophageal has been performed.  Georgian Co 09/23/2012, 10:27 AM

## 2012-09-23 NOTE — Progress Notes (Signed)
The patient was examined and preop studies reviewed. There has been no change from the prior exam and the patient is ready for surgery.  Urine drug screen negative for cocaine.  Plan high-risk redo mitral valve replacement and tricuspid valve repair. Patient understands the increased risks of bleeding, renal failure, stroke, and death. High risks involved with this operation also understood by his wife.

## 2012-09-23 NOTE — Anesthesia Procedure Notes (Signed)
Procedure Name: Intubation Date/Time: 09/23/2012 9:05 AM Performed by: Jefm Miles E Pre-anesthesia Checklist: Patient identified, Timeout performed, Emergency Drugs available, Suction available and Patient being monitored Patient Re-evaluated:Patient Re-evaluated prior to inductionOxygen Delivery Method: Circle system utilized Preoxygenation: Pre-oxygenation with 100% oxygen Intubation Type: IV induction Ventilation: Mask ventilation without difficulty Laryngoscope Size: Mac and 3 Grade View: Grade I Tube type: Oral Tube size: 8.0 mm Number of attempts: 1 Airway Equipment and Method: Stylet Placement Confirmation: ETT inserted through vocal cords under direct vision,  breath sounds checked- equal and bilateral and positive ETCO2 Secured at: 22 cm Tube secured with: Tape Dental Injury: Teeth and Oropharynx as per pre-operative assessment

## 2012-09-24 ENCOUNTER — Inpatient Hospital Stay (HOSPITAL_COMMUNITY): Payer: Medicaid Other

## 2012-09-24 ENCOUNTER — Ambulatory Visit: Payer: Self-pay | Admitting: Cardiothoracic Surgery

## 2012-09-24 LAB — CBC
HCT: 21.1 % — ABNORMAL LOW (ref 39.0–52.0)
HCT: 23 % — ABNORMAL LOW (ref 39.0–52.0)
HCT: 26.1 % — ABNORMAL LOW (ref 39.0–52.0)
Hemoglobin: 7.8 g/dL — ABNORMAL LOW (ref 13.0–17.0)
MCH: 30.2 pg (ref 26.0–34.0)
MCH: 30.4 pg (ref 26.0–34.0)
MCHC: 33.2 g/dL (ref 30.0–36.0)
MCHC: 33.9 g/dL (ref 30.0–36.0)
MCHC: 34.5 g/dL (ref 30.0–36.0)
MCV: 90.9 fL (ref 78.0–100.0)
MCV: 89.5 fL (ref 78.0–100.0)
Platelets: 121 10*3/uL — ABNORMAL LOW (ref 150–400)
Platelets: 90 10*3/uL — ABNORMAL LOW (ref 150–400)
RBC: 2.57 MIL/uL — ABNORMAL LOW (ref 4.22–5.81)
RDW: 20.4 % — ABNORMAL HIGH (ref 11.5–15.5)
RDW: 20.1 % — ABNORMAL HIGH (ref 11.5–15.5)
RDW: 17.7 % — ABNORMAL HIGH (ref 11.5–15.5)
WBC: 17.2 10*3/uL — ABNORMAL HIGH (ref 4.0–10.5)
WBC: 16 10*3/uL — ABNORMAL HIGH (ref 4.0–10.5)

## 2012-09-24 LAB — CARBOXYHEMOGLOBIN
Carboxyhemoglobin: 1.8 % — ABNORMAL HIGH (ref 0.5–1.5)
Carboxyhemoglobin: 1.8 % — ABNORMAL HIGH (ref 0.5–1.5)
Carboxyhemoglobin: 1.8 % — ABNORMAL HIGH (ref 0.5–1.5)
Methemoglobin: 1.7 % — ABNORMAL HIGH (ref 0.0–1.5)
Methemoglobin: 1.6 % — ABNORMAL HIGH (ref 0.0–1.5)
Methemoglobin: 1.2 % (ref 0.0–1.5)
O2 Saturation: 61.8 %
O2 Saturation: 66.8 %
O2 Saturation: 79.5 %
Total hemoglobin: 8 g/dL — ABNORMAL LOW (ref 13.5–18.0)
Total hemoglobin: 6.6 g/dL — CL (ref 13.5–18.0)
Total hemoglobin: 8.4 g/dL — ABNORMAL LOW (ref 13.5–18.0)

## 2012-09-24 LAB — POCT I-STAT, CHEM 8
BUN: 56 mg/dL — ABNORMAL HIGH (ref 6–23)
Calcium, Ion: 1.12 mmol/L (ref 1.12–1.23)
Chloride: 101 mEq/L (ref 96–112)
Creatinine, Ser: 3.5 mg/dL — ABNORMAL HIGH (ref 0.50–1.35)
Glucose, Bld: 141 mg/dL — ABNORMAL HIGH (ref 70–99)
HCT: 24 % — ABNORMAL LOW (ref 39.0–52.0)
Hemoglobin: 8.2 g/dL — ABNORMAL LOW (ref 13.0–17.0)
Potassium: 3.5 mEq/L (ref 3.5–5.1)
Sodium: 139 mEq/L (ref 135–145)
TCO2: 23 mmol/L (ref 0–100)

## 2012-09-24 LAB — PREPARE PLATELET PHERESIS
Unit division: 0
Unit division: 0

## 2012-09-24 LAB — GLUCOSE, CAPILLARY
Glucose-Capillary: 79 mg/dL (ref 70–99)
Glucose-Capillary: 72 mg/dL (ref 70–99)
Glucose-Capillary: 96 mg/dL (ref 70–99)
Glucose-Capillary: 107 mg/dL — ABNORMAL HIGH (ref 70–99)
Glucose-Capillary: 130 mg/dL — ABNORMAL HIGH (ref 70–99)
Glucose-Capillary: 130 mg/dL — ABNORMAL HIGH (ref 70–99)
Glucose-Capillary: 133 mg/dL — ABNORMAL HIGH (ref 70–99)
Glucose-Capillary: 131 mg/dL — ABNORMAL HIGH (ref 70–99)
Glucose-Capillary: 142 mg/dL — ABNORMAL HIGH (ref 70–99)
Glucose-Capillary: 128 mg/dL — ABNORMAL HIGH (ref 70–99)

## 2012-09-24 LAB — POCT I-STAT 3, ART BLOOD GAS (G3+)
Acid-base deficit: 8 mmol/L — ABNORMAL HIGH (ref 0.0–2.0)
Acid-base deficit: 2 mmol/L (ref 0.0–2.0)
Acid-base deficit: 1 mmol/L (ref 0.0–2.0)
Acid-base deficit: 2 mmol/L (ref 0.0–2.0)
Bicarbonate: 17.7 mEq/L — ABNORMAL LOW (ref 20.0–24.0)
Bicarbonate: 23.3 mEq/L (ref 20.0–24.0)
Bicarbonate: 25.2 mEq/L — ABNORMAL HIGH (ref 20.0–24.0)
Bicarbonate: 23.5 mEq/L (ref 20.0–24.0)
O2 Saturation: 99 %
O2 Saturation: 99 %
O2 Saturation: 98 %
O2 Saturation: 96 %
Patient temperature: 37.7
Patient temperature: 36.7
Patient temperature: 35.7
Patient temperature: 35.1
TCO2: 19 mmol/L (ref 0–100)
TCO2: 24 mmol/L (ref 0–100)
TCO2: 27 mmol/L (ref 0–100)
TCO2: 25 mmol/L (ref 0–100)
pCO2 arterial: 36.1 mmHg (ref 35.0–45.0)
pCO2 arterial: 38.6 mmHg (ref 35.0–45.0)
pCO2 arterial: 44 mmHg (ref 35.0–45.0)
pCO2 arterial: 36.9 mmHg (ref 35.0–45.0)
pH, Arterial: 7.301 — ABNORMAL LOW (ref 7.350–7.450)
pH, Arterial: 7.388 (ref 7.350–7.450)
pH, Arterial: 7.361 (ref 7.350–7.450)
pH, Arterial: 7.404 (ref 7.350–7.450)
pO2, Arterial: 132 mmHg — ABNORMAL HIGH (ref 80.0–100.0)
pO2, Arterial: 165 mmHg — ABNORMAL HIGH (ref 80.0–100.0)
pO2, Arterial: 98 mmHg (ref 80.0–100.0)
pO2, Arterial: 75 mmHg — ABNORMAL LOW (ref 80.0–100.0)

## 2012-09-24 LAB — PREPARE FRESH FROZEN PLASMA
Unit division: 0
Unit division: 0

## 2012-09-24 LAB — CREATININE, SERUM
Creatinine, Ser: 3.77 mg/dL — ABNORMAL HIGH (ref 0.50–1.35)
GFR calc Af Amer: 20 mL/min — ABNORMAL LOW (ref >90)
GFR calc non Af Amer: 17 mL/min — ABNORMAL LOW (ref >90)

## 2012-09-24 LAB — PREPARE CRYOPRECIPITATE
Unit division: 0
Unit division: 0

## 2012-09-24 LAB — BASIC METABOLIC PANEL
BUN: 54 mg/dL — ABNORMAL HIGH (ref 6–23)
Creatinine, Ser: 3.74 mg/dL — ABNORMAL HIGH (ref 0.50–1.35)
GFR calc Af Amer: 20 mL/min — ABNORMAL LOW (ref >90)
GFR calc non Af Amer: 17 mL/min — ABNORMAL LOW (ref >90)

## 2012-09-24 LAB — MAGNESIUM: Magnesium: 2.2 mg/dL (ref 1.5–2.5)

## 2012-09-24 LAB — LACTIC ACID, PLASMA: Lactic Acid, Venous: 6.9 mmol/L — ABNORMAL HIGH (ref 0.5–2.2)

## 2012-09-24 MED ORDER — BUDESONIDE-FORMOTEROL FUMARATE 160-4.5 MCG/ACT IN AERO
2.0000 | INHALATION_SPRAY | Freq: Two times a day (BID) | RESPIRATORY_TRACT | Status: DC
  Administered 2012-09-24 – 2012-09-30 (×13): 2 via RESPIRATORY_TRACT
  Filled 2012-09-24: qty 6

## 2012-09-24 MED ORDER — EPINEPHRINE HCL 1 MG/ML IJ SOLN
0.5000 ug/min | INTRAVENOUS | Status: DC
  Administered 2012-09-25: 2 ug/min via INTRAVENOUS
  Filled 2012-09-24: qty 4

## 2012-09-24 MED ORDER — BIOTENE DRY MOUTH MT LIQD
15.0000 mL | Freq: Four times a day (QID) | OROMUCOSAL | Status: DC
  Administered 2012-09-24 – 2012-09-26 (×9): 15 mL via OROMUCOSAL

## 2012-09-24 MED ORDER — SODIUM BICARBONATE 8.4 % IV SOLN
50.0000 meq | Freq: Once | INTRAVENOUS | Status: AC
  Administered 2012-09-24: 50 meq via INTRAVENOUS
  Filled 2012-09-24: qty 50

## 2012-09-24 MED ORDER — CHLORHEXIDINE GLUCONATE 0.12 % MT SOLN
15.0000 mL | Freq: Two times a day (BID) | OROMUCOSAL | Status: DC
  Administered 2012-09-24 – 2012-09-25 (×3): 15 mL via OROMUCOSAL
  Filled 2012-09-24 (×3): qty 15

## 2012-09-24 MED ORDER — POTASSIUM CHLORIDE 10 MEQ/50ML IV SOLN
10.0000 meq | INTRAVENOUS | Status: AC
  Administered 2012-09-24 (×2): 10 meq via INTRAVENOUS

## 2012-09-24 MED ORDER — FUROSEMIDE 10 MG/ML IJ SOLN
40.0000 mg | Freq: Two times a day (BID) | INTRAMUSCULAR | Status: DC
  Administered 2012-09-24 – 2012-09-27 (×6): 40 mg via INTRAVENOUS
  Filled 2012-09-24 (×7): qty 4

## 2012-09-24 MED ORDER — MILRINONE IN DEXTROSE 20 MG/100ML IV SOLN
0.2500 ug/kg/min | INTRAVENOUS | Status: DC
  Administered 2012-09-25 – 2012-09-26 (×2): 0.25 ug/kg/min via INTRAVENOUS
  Filled 2012-09-24 (×3): qty 100

## 2012-09-24 MED ORDER — SILDENAFIL CITRATE 20 MG PO TABS
20.0000 mg | ORAL_TABLET | Freq: Two times a day (BID) | ORAL | Status: DC
  Administered 2012-09-24 – 2012-09-30 (×13): 20 mg via ORAL
  Filled 2012-09-24 (×17): qty 1

## 2012-09-24 MED FILL — Potassium Chloride Inj 2 mEq/ML: INTRAVENOUS | Qty: 40 | Status: AC

## 2012-09-24 MED FILL — Magnesium Sulfate Inj 50%: INTRAMUSCULAR | Qty: 10 | Status: AC

## 2012-09-24 NOTE — Op Note (Signed)
NAMEVAISHNAV, DEMARTIN NO.:  [id number]  MEDICAL RECORD NO.:  [id number]  LOCATION:  2316                         FACILITY:  [facility]  PHYSICIAN:  [name], M.D.  DATE OF BIRTH:  04/21/1961  DATE OF PROCEDURE:  09/23/2012 DATE OF DISCHARGE:                              OPERATIVE REPORT   OPERATION: 1. Redo mitral valve replacement with a 27-mm Edwards tissue valve,     serial [id number]. 2. Tricuspid annuloplasty with a MC3 annuloplasty ring, serial     #[id number]. 3. Placement of right femoral A-line for blood pressure monitoring.  SURGEON:  [name], M.D.  ASSISTANT:  Rowe Clack, P.A.-C.  ANESTHESIA:  General by Quita Skye. Krista Blue, M.D.  PREOPERATIVE DIAGNOSES: 1. Moderate-to-severe recurrent mitral regurgitation after mitral     valve repair in 2011. 2. History of cocaine-substance abuse. 3. History of malignant hypertension and chronic non-dialysis     dependent renal failure.  INDICATIONS:  The patient is a 51 year old gentleman, who was recently hospitalized with recurrent heart failure.  Cardiac catheterizations are clean coronaries.  Transesophageal echo showed evidence of moderate-to- severe mitral regurgitation and mild-to-moderate mitral stenosis, and moderate-to-severe tricuspid regurgitation, with biventricular enlargement and pulmonary hypertension.  The patient was in sinus rhythm.  He was felt to be candidate for redo mitral valve replacement with a tissue valve, and tricuspid valve annuloplasty.  Prior to surgery, I examined the patient in the office and reviewed the results of his studies.  I discussed the indications and expected benefits, as well as the risks of redo-sternotomy, mitral valve redo replacement and tricuspid valve annuloplasty.  He understood this operation would have significant risks of renal failure due to his long-standing renal insufficiency, bleeding, blood transfusion requirement, stroke, and death.  He  agreed to proceed with the surgery as it was the only realistic therapeutic option for this patient, that being high risk redo valve surgery.  On the morning of surgery, the patient underwent a drug screen with urine testing and he was negative for cocaine.  OPERATIVE PROCEDURE:  The patient was brought to the operating room and placed supine on the operating room table, where general anesthesia was induced under invasive hemodynamic monitor.  A transesophageal echo probe was placed by the anesthesiologist, which confirmed the preoperative diagnosis of moderate-to-severe mitral regurgitation, biventricular enlargement and biventricular dysfunction.  There was also moderate-to-severe TR.  The patient was started on nitric oxide to help reduce his PA pressures while we were preparing for sternal re-entry. The patient was prepped and draped as a sterile field.  A femoral A-line was placed in the right femoral artery for blood pressure monitoring.  A sternal incision was made through the old incision and the wires were removed.  The sternum was divided using an oscillating saw.  The heart and right ventricle were adherent to the undersurface of the sternum. This was carefully dissected free.  The anterior mediastinum was carefully dissected with tedious time consuming dissection.  The sternal retractor was placed, and the plane was developed along the right side of the heart to dissect up the right ventricle and right atrium.  The plane of dissection was then carried superiorly to  dissect out the ascending aorta.  At this point, the patient was heparinized. Pursestrings placed in the ascending aorta and in the superior vena cava, and the patient was cannulated and placed on partial bypass.  A second pursestring was placed above the inferior vena cava and a second cannula was placed in the IVC, and the patient was placed on bi-caval drainage and caval tapes were placed around the SVC and  IVC.  Next, the remainder of the right atrium was dissected out for plans to do a transseptal approach to the mitral valve.  Next, cardioplegic cannulas were placed for both antegrade and retrograde cold blood cardioplegia.  The patient was then cooled to 30 degrees and the aortic crossclamp was applied.  A 1 liter of cold blood cardioplegia was delivered in split doses between the antegrade aortic and retrograde coronary sinus catheters.  There was good cardioplegic arrest.  Septal temperature dropped less than 15 degrees.  Cardioplegia was delivered every 20 minutes while the crossclamp was in place.  A right atriotomy incision was made as the caval tapes were tightened. The fossa ovalis was identified and incision was made to the right side or inferior aspect of the fossa ovalis.  This entered into the left atrium.  This incision was then extended superiorly and inferiorly, so that we could expose the mitral valve.  The mitral valve was exposed adequately.  The valve was inspected.  The ring was intact, but some of the sutures along the trigone on the superior annulus had pulled through.  The sutures were intact, but pulled through the heart cardiac muscle.  The ring was excised, and all the ring sutures were removed. The valve was inspected.  It appeared to be retracted and rheumatic with some foreshortening of the cords of the posterior leaflet.  This made the valve annulus somewhat small.  I released the thickened foreshortened cords on the posterior leaflet and removed some of the posterior leaflet, which was thickened, but not calcified.  This opened up the annulus to accept a 27 sizer.  Next, 2-0 pledgeted Ethibond sutures were placed around the annulus, numbering 15 total.  A 27-mm Edwards Magna Ease mitral valve was selected, and was repaired according to protocol.  The sutures were placed through the sewing ring and the valve was seated and sutures were tied.  The valve  fit the annulus very nicely and there was no evidence of any perivalvular spaces for a week.  The whole area was irrigated copiously and then the interatrial septum was closed using careful reapproximation with running 3-0 Prolene.  Cardioplegia was continuously redosed every 20 minutes.  Next, attention was directed to the tricuspid valve.  The retractors were placed through the same atriotomy incision.  A 2-0 Ethibond sutures were placed around the anulus numbering 9 sutures total.  The tricuspid valve was sized to a 28-mm annuloplasty ring.  The MC3 Edwards ring was selected and prepared, and the annular sutures were placed at the ring and the ring was seated, and the sutures were tied.  The valve was tested with the saline test, and there is no regurgitation.  Care was being taken to avoid the suture placement in the area of the conduction system.  Next, the right atrium incision was closed with 2 layers of running Prolene and his air is being evacuated from the heart.  The crossclamp was removed.  The heart resumed a spontaneous rhythm.  The patient was rewarmed and reperfused.  The surgical incisions were  checked and found to be intact. The cable tapes had been released.  Temporary pacing wires were applied. Low-dose dopamine, milrinone, and epinephrine was started.  The patient was placed on nitric oxide, and the ventilator was resumed, and the lungs re-expanded.  The patient was atrially paced, and was weaned off bypass.  At first the right ventricle had some distention and dysfunction, but with time, some calcium chloride, and the nitric oxide, the RV function significantly improved, as did the overall cardiac function.  The echo showed both valves to be functioning well.  It should be noted that the anterior leaflet of the native mitral valve was left intact.  The patient was given protamine carefully.  There was no adverse reaction.  There was still diffuse coagulopathy.   The patient's platelet count returned at 50,000.  The patient was given platelets and FFP with some improvement of coagulopathy, but not complete resolution. Careful search for areas of bleeding was then performed with meticulous coagulation of the mediastinum edges of the sternum and the cannulation sites were all checked and found to be hemostatic.  Bilateral pleural tubes and anterior mediastinal and a posterior mediastinal chest tubes were placed and brought out through separate incisions.  The patient remained hemodynamically stable.  The sternal wires were placed, and the sternum was closed.  The patient tolerated this well.  The pectoralis fascia was closed in running #1 Vicryl.  Subcutaneous and skin layers were closed in running Vicryl.  Sterile dressings were applied.  Total cardiopulmonary bypass time was 230 minutes.     [name], M.D.     PV/MEDQ  D:  09/23/2012  T:  09/24/2012  Job:  [id number]  cc:   Veverly Fells. Excell Seltzer, MD

## 2012-09-24 NOTE — Progress Notes (Signed)
ANTIBIOTIC CONSULT NOTE - INITIAL  Pharmacy Consult for vancomycin/zinacef Indication: Post-op antibiotics for prophylaxis  Allergies  Allergen Reactions  . Cashew Nut Oil Anaphylaxis  . Food Anaphylaxis    Strawberries, Cashews, causes throat to swell - affects breathing    Patient Measurements: Weight: 160 lb 7.9 oz (72.8 kg)   Vital Signs: Temp: 96.4 F (35.8 C) (11/06 1615) Temp src: Core (Comment) (11/06 1615) BP: 85/47 mmHg (11/06 1600) Pulse Rate: 90  (11/06 1615) Intake/Output from previous day: 11/05 0701 - 11/06 0700 In: 1610 [I.V.:5074.6; Blood:2844.3; NG/GT:90; IV Piggyback:672] Out: 1885 [Urine:985; Chest Tube:900] Intake/Output from this shift: Total I/O In: 2267.4 [I.V.:1067.4; Blood:700; NG/GT:200; IV Piggyback:300] Out: 870 [Urine:760; Chest Tube:110]  Labs:  Basename 09/24/12 1300 09/24/12 0443 09/23/12 1900 09/23/12 1803 09/23/12 1749  WBC 17.2* 20.2* -- -- 15.6*  HGB 7.8* 7.0* -- 10.2* --  PLT 121* 154 102* -- --  LABCREA -- -- -- -- --  CREATININE -- 3.74* -- -- --   The CrCl is unknown because both a height and weight (above a minimum accepted value) are required for this calculation. No results found for this basename: VANCOTROUGH:2,VANCOPEAK:2,VANCORANDOM:2,GENTTROUGH:2,GENTPEAK:2,GENTRANDOM:2,TOBRATROUGH:2,TOBRAPEAK:2,TOBRARND:2,AMIKACINPEAK:2,AMIKACINTROU:2,AMIKACIN:2, in the last 72 hours   Microbiology: Recent Results (from the past 720 hour(s))  SURGICAL PCR SCREEN     Status: Normal   Collection Time   09/19/12  3:10 PM      Component Value Range Status Comment   MRSA, PCR NEGATIVE  NEGATIVE Final    Staphylococcus aureus NEGATIVE  NEGATIVE Final     Medical History: Past Medical History  Diagnosis Date  . Hypertension   . Bipolar disorder   . Schizophrenia     h/o hospitalized inpt therapy @ the [facility]  . MVP (mitral valve prolapse)     with severe MR s/p repair with annuloplasty and cleft MV repair in  09/2010, last echo June 2013 showed EF 55-60%; restrictive physiology; mod MR, severe TR.   Marland Kitchen Hyperlipidemia   . H/O: suicide attempt 1998    was hospitalized at [facility]  . Anemia     Hx of iron infusions  . Chronic diastolic heart failure     Hx of Class IV CHF  . Gastroparesis   . Generalized headaches     sinus  . Arthritis   . Cocaine abuse   . Anxiety   . CHF (congestive heart failure)   . GERD (gastroesophageal reflux disease)   . Heart murmur   . Shortness of breath     with ambulation and sometimes sitting  . Depression   . Vertigo   . Constipation   . Itchy skin   . Chronic kidney disease     has fistula, not on HD   Assessment: 52 year old male presents to [facility] for redo MVR and tricuspid valve repair. The patient has CKD with fistula but not currently on HD. His scr is 3.74 which is elevated from 2.5 5 days ago, est. crcl ~ 20 . The zinacef will not need dose adjusting. Vancomycin 1.25g received pre-op yesterday and 1g this Am should get his serum level therapeutic for 48 hrs  Plan:  D/C vanc dose for tomorrow. Please consult Rx if further vancomycin is needed.  Bayard Hugger, PharmD, BCPS  Clinical Pharmacist  Pager: [phone]  09/24/2012,4:32 PM

## 2012-09-24 NOTE — Progress Notes (Signed)
INITIAL ADULT NUTRITION ASSESSMENT Date: 09/24/2012   Time: 3:34 PM  INTERVENTION:  If EN started, recommend Jevity 1.2 formula -- initiate at 15 ml/hr and increase by 10 ml every 4 hours to goal rate of 65 ml/hr to provide 1872 kcals, 87 gm protein, 1259 ml of free water RD to follow for nutrition care plan  DOCUMENTATION CODES Per approved criteria  -Not Applicable   Reason for Assessment: VDRF  ASSESSMENT: Male 51 y.o.  Dx: mitral regurgitation  Hx:  Past Medical History  Diagnosis Date  . Hypertension   . Bipolar disorder   . Schizophrenia     h/o hospitalized inpt therapy @ the [facility]  . 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   Marland Kitchen Hyperlipidemia   . H/O: suicide attempt 1998    was hospitalized at [facility]  . Anemia     Hx of iron infusions  . Chronic diastolic heart failure     Hx of Class IV CHF  . Gastroparesis   . Generalized headaches     sinus  . Arthritis   . Cocaine abuse   . Anxiety   . CHF (congestive heart failure)   . GERD (gastroesophageal reflux disease)   . Heart murmur   . Shortness of breath     with ambulation and sometimes sitting  . Depression   . Vertigo   . Constipation   . Itchy skin   . Chronic kidney disease     has fistula, not on HD    Related Meds:     . [COMPLETED] acetaminophen  1,000 mg Intravenous Once  . acetaminophen  1,000 mg Oral Q6H   Or  . acetaminophen (TYLENOL) oral liquid 160 mg/5 mL  975 mg Per Tube Q6H  . antiseptic oral rinse  15 mL Mouth Rinse QID  . aspirin EC  325 mg Oral Daily   Or  . aspirin  324 mg Per Tube Daily  . bisacodyl  10 mg Oral Daily   Or  . bisacodyl  10 mg Rectal Daily  . budesonide-formoterol  2 puff Inhalation BID  . [COMPLETED] cefUROXime (ZINACEF)  IV  1.5 g Intravenous To OR  . cefUROXime (ZINACEF)  IV  1.5 g  Intravenous Q12H  . chlorhexidine  15 mL Mouth Rinse BID  . [COMPLETED] desmopressin (DDAVP) IV  20 mcg Intravenous Once  . docusate sodium  200 mg Oral Daily  . [COMPLETED] famotidine (PEPCID) IV  20 mg Intravenous Q12H  . furosemide  40 mg Intravenous BID  . [EXPIRED] insulin aspart  0-24 Units Subcutaneous Q2H   Followed by  . insulin aspart  0-24 Units Subcutaneous Q4H  . levalbuterol  1.25 mg Nebulization TID  . [COMPLETED] magnesium sulfate  4 g Intravenous Once  . metoprolol tartrate  12.5 mg Oral BID   Or  . metoprolol tartrate  12.5 mg Per Tube BID  . [COMPLETED] nitroGLYCERIN  2-200 mcg/min Intravenous To OR  . pantoprazole  40 mg Oral Daily  . [EXPIRED] potassium chloride  10 mEq Intravenous Q1 Hr x 3  . [COMPLETED] protamine  25 mg Intravenous Once  . sildenafil  20 mg Oral BID AC  . [COMPLETED] sodium bicarbonate  50 mEq Intravenous Once  . [COMPLETED] sodium bicarbonate  50 mEq Intravenous Once  . sodium chloride  3 mL Intravenous Q12H  . vancomycin  1,000 mg Intravenous Q24 Hr x 2  . [DISCONTINUED] chlorhexidine  30 mL Topical UD  . [DISCONTINUED] insulin regular  0-10 Units Intravenous TID WC  . [DISCONTINUED] milrinone  0.125 mcg/kg/min Intravenous To OR  . [DISCONTINUED] vancomycin  1,000 mg Intravenous Once    Ht: 5\' 7"  (170.2 cm)  Wt: 160 lb 7.9 oz (72.8 kg)  Ideal Wt: 67.2 kg % Ideal Wt: 108%  Usual Wt: 152 lb -- per office visit record 09/16/12 % Usual Wt: 105%  BMI = 25.2 kg/m2  Food/Nutrition Related Hx: admission nutrition screen incomplete  Labs:  CMP     Component Value Date/Time   NA 136 09/24/2012 0443   K 3.6 09/24/2012 0443   CL 100 09/24/2012 0443   CO2 17* 09/24/2012 0443   GLUCOSE 133* 09/24/2012 0443   BUN 54* 09/24/2012 0443   CREATININE 3.74* 09/24/2012 0443   CALCIUM 8.5 09/24/2012 0443   CALCIUM PENDING 06/28/2011 1331   CALCIUM 9.3 06/28/2011 1331   PROT 6.9 09/19/2012 1508   ALBUMIN 3.3* 09/19/2012 1508   AST 51* 09/19/2012 1508    ALT 28 09/19/2012 1508   ALKPHOS 169* 09/19/2012 1508   BILITOT 1.4* 09/19/2012 1508   GFRNONAA 17* 09/24/2012 0443   GFRAA 20* 09/24/2012 0443      Intake/Output Summary (Last 24 hours) at 09/24/12 1536 Last data filed at 09/24/12 1500  Gross per 24 hour  Intake 8964.74 ml  Output   2400 ml  Net 6564.74 ml    CBG (last 3)   Basename 09/24/12 1114 09/24/12 0734 09/24/12 0400  GLUCAP 131* 133* 130*    Diet Order: NPO  Supplements/Tube Feeding: N/A  IVF:    sodium chloride Last Rate: 20 mL/hr at 09/24/12 1400  sodium chloride Last Rate: 20 mL/hr at 09/24/12 1400  sodium chloride Last Rate: 250 mL (09/24/12 1400)  dexmedetomidine Last Rate: 0.7 mcg/kg/hr (09/24/12 1420)  DOPamine Last Rate: 4.983 mcg/kg/min (09/24/12 1400)  epinephrine Last Rate: 2 mcg/min (09/24/12 1400)  milrinone Last Rate: 0.5 mcg/kg/min (09/24/12 1430)  phenylephrine (NEO-SYNEPHRINE) Adult infusion Last Rate: 30 mcg/min (09/24/12 1400)  [DISCONTINUED] sodium chloride   [DISCONTINUED] insulin (NOVOLIN-R) infusion Last Rate: Stopped (09/23/12 2050)  [DISCONTINUED] lactated ringers Last Rate: Stopped (09/23/12 2000)  [DISCONTINUED] milrinone Last Rate: 0.5 mcg/kg/min (09/24/12 1401)  [DISCONTINUED] nitroGLYCERIN Last Rate: Stopped (09/23/12 1830)    Estimated Nutritional Needs:   Kcal: 1900-2000 Protein: 90-100 gm Fluid: 1.9-2.0 L  Patient is currently intubated on ventilator support MV: 10.4 Temp: 37.5  S/p redo mitral valve replacement, tricuspid valve repair 11/5; remains intubated post-op; OGT in place; on nitric oxide at this time -- RT trying to wean patient off; no skin breakdown noted.  NUTRITION DIAGNOSIS: -Inadequate oral intake (NI-2.1).  Status: Ongoing  RELATED TO: inability to eat  AS EVIDENCE BY: NPO status  MONITORING/EVALUATION(Goals): Goal: Initiate EN within next 24 hours if extubation not expected Monitor: EN initiation, respiratory status, weight, labs, I/O's  EDUCATION  NEEDS: -No education needs identified at this time  Kirkland Hun, RD, LDN Pager #: [phone] After-Hours Pager #: [phone]

## 2012-09-24 NOTE — Plan of Care (Signed)
Problem: Phase II Progression Outcomes Goal: Dangles within 2 hrs after extubation Outcome: Not Applicable Date Met:  09/24/12 Pt has femoral A-line. MD aware of inability to dangle

## 2012-09-24 NOTE — Progress Notes (Addendum)
TCTS DAILY PROGRESS NOTE                   [address]            [place] [address]          [phone]      1 Day Post-Op Procedure(s) (LRB): REDO MITRAL VALVE REPLACEMENT (MVR) (N/A) TRICUSPID VALVE REPAIR (N/A)  Total Length of Stay:  LOS: 1 day   Subjective: very alert, moving extrems to command. Radial art line running 30mm below femoral.NO now on 15. Precidex now on 0.1  Objective: Vital signs in last 24 hours: Temp:  [96.4 F (35.8 C)-100.2 F (37.9 C)] 100 F (37.8 C) (11/06 0700) Pulse Rate:  [89-90] 90  (11/06 0700) Cardiac Rhythm:  [-] Atrial paced (11/05 2000) Resp:  [12-24] 23  (11/06 0700) BP: (51-102)/(28-61) 81/40 mmHg (11/06 0700) SpO2:  [94 %-100 %] 100 % (11/06 0700) Arterial Line BP: (89-143)/(37-67) 108/40 mmHg (11/06 0700) FiO2 (%):  [50 %-60 %] 50 % (11/06 0325) Weight:  [160 lb 7.9 oz (72.8 kg)] 160 lb 7.9 oz (72.8 kg) (11/06 0500)  Filed Weights   09/22/12 1300 09/24/12 0500  Weight: 151 lb (68.493 kg) 160 lb 7.9 oz (72.8 kg)    Weight change: 9 lb 7.9 oz (4.307 kg)   Hemodynamic parameters for last 24 hours: PAP: (36-54)/(15-29) 42/16 mmHg CVP:  [10 mmHg-14 mmHg] 12 mmHg CO:  [4.8 L/min-6.2 L/min] 6.2 L/min CI:  [2.7 L/min/m2-3.5 L/min/m2] 3.5 L/min/m2  Intake/Output from previous day: 11/05 0701 - 11/06 0700 In: 1308 [I.V.:5074.6; Blood:2844.3; NG/GT:90; IV Piggyback:672] Out: 6578 [Urine:985; Chest Tube:900]  Intake/Output this shift:    Current Meds: Scheduled Meds:   . [COMPLETED] acetaminophen  1,000 mg Intravenous Once  . acetaminophen  1,000 mg Oral Q6H   Or  . acetaminophen (TYLENOL) oral liquid 160 mg/5 mL  975 mg Per Tube Q6H  . [COMPLETED] aminocaproic acid (AMICAR) for OHS   Intravenous To OR  . aspirin EC  325 mg Oral Daily   Or  . aspirin  324 mg Per Tube Daily  . bisacodyl  10 mg Oral Daily   Or  . bisacodyl  10 mg Rectal Daily  . [COMPLETED] cefUROXime (ZINACEF)  IV  1.5 g Intravenous To  OR  . cefUROXime (ZINACEF)  IV  1.5 g Intravenous Q12H  . [COMPLETED] desmopressin (DDAVP) IV  20 mcg Intravenous Once  . [COMPLETED] dexmedetomidine  0.1-0.7 mcg/kg/hr Intravenous To OR  . docusate sodium  200 mg Oral Daily  . [COMPLETED] DOPamine  2-20 mcg/kg/min Intravenous To OR  . famotidine (PEPCID) IV  20 mg Intravenous Q12H  . [EXPIRED] insulin aspart  0-24 Units Subcutaneous Q2H   Followed by  . insulin aspart  0-24 Units Subcutaneous Q4H  . [COMPLETED] insulin (NOVOLIN-R) infusion   Intravenous To OR  . levalbuterol  1.25 mg Nebulization TID  . [COMPLETED] magnesium sulfate  4 g Intravenous Once  . metoprolol tartrate  12.5 mg Oral BID   Or  . metoprolol tartrate  12.5 mg Per Tube BID  . [COMPLETED] nitroGLYCERIN  2-200 mcg/min Intravenous To OR  . pantoprazole  40 mg Oral Daily  . [COMPLETED] phenylephrine (NEO-SYNEPHRINE) Adult infusion  30-200 mcg/min Intravenous To OR  . [EXPIRED] potassium chloride  10 mEq Intravenous Q1 Hr x 3  . [COMPLETED] protamine  25 mg Intravenous Once  . [COMPLETED] sodium bicarbonate  50 mEq Intravenous Once  . [COMPLETED] sodium  bicarbonate  50 mEq Intravenous Once  . sodium chloride  3 mL Intravenous Q12H  . [COMPLETED] vancomycin  1,250 mg Intravenous To OR  . vancomycin  1,000 mg Intravenous Q24 Hr x 2  . [DISCONTINUED] aminocaproic acid (AMICAR) for OHS   Intravenous To OR  . [DISCONTINUED] cefUROXime (ZINACEF)  IV  750 mg Intravenous To OR  . [DISCONTINUED] chlorhexidine  30 mL Topical UD  . [DISCONTINUED] epinephrine  0.5-20 mcg/min Intravenous To OR  . [DISCONTINUED] heparin-papaverine-plasmalyte irrigation   Irrigation To OR  . [DISCONTINUED] insulin regular  0-10 Units Intravenous TID WC  . [DISCONTINUED] magnesium sulfate  40 mEq Other To OR  . [DISCONTINUED] metoprolol tartrate  12.5 mg Oral Once  . [DISCONTINUED] milrinone  0.125 mcg/kg/min Intravenous To OR  . [DISCONTINUED] potassium chloride  80 mEq Other To OR  .  [DISCONTINUED] vancomycin  1,000 mg Intravenous Once   Continuous Infusions:   . sodium chloride 20 mL/hr at 09/24/12 0600  . sodium chloride 20 mL/hr at 09/24/12 0600  . sodium chloride 250 mL (09/24/12 0559)  . dexmedetomidine 0.1 mcg/kg/hr (09/24/12 0645)  . DOPamine 5 mcg/kg/min (09/24/12 0700)  . epinephrine 2 mcg/min (09/24/12 0700)  . lactated ringers Stopped (09/23/12 2000)  . milrinone 0.3 mcg/kg/min (09/24/12 0700)  . nitroGLYCERIN Stopped (09/23/12 1830)  . phenylephrine (NEO-SYNEPHRINE) Adult infusion 30 mcg/min (09/24/12 0700)  . [DISCONTINUED] sodium chloride    . [DISCONTINUED] insulin (NOVOLIN-R) infusion Stopped (09/23/12 2050)   PRN Meds:.albumin human, [EXPIRED] lactated ringers, metoprolol, midazolam, [EXPIRED]  morphine injection, morphine injection, ondansetron (ZOFRAN) IV, oxyCODONE, sodium chloride, [DISCONTINUED] hemostatic agents, [DISCONTINUED] hemostatic agents, [DISCONTINUED] sodium chloride irrigation, [DISCONTINUED] Surgifoam 1 Gm with 0.9% sodium chloride (4 ml) topical solution  General appearance: alert, cooperative and no distress Neurologic: intact Heart: regular rate and rhythm and systolic murmur 2/6heard throughout precordium Lungs: some upper airway ronchi Abdomen: soft, + BS Extremities: not edematous Wound: dressings CDI  Lab Results: CBC: Basename 09/24/12 0443 09/23/12 1900 09/23/12 1803 09/23/12 1749  WBC 20.2* -- -- 15.6*  HGB 7.0* -- 10.2* --  HCT 21.1* -- 30.0* --  PLT 154 102* -- --   BMET:  Basename 09/24/12 0443 09/23/12 1803  NA 136 135  K 3.6 4.8  CL 100 --  CO2 17* --  GLUCOSE 133* 91  BUN 54* --  CREATININE 3.74* --  CALCIUM 8.5 --    PT/INR:  Basename 09/23/12 1900  LABPROT 21.7*  INR 1.98*   ABG    Component Value Date/Time   PHART 7.301* 09/24/2012 0433   PCO2ART 36.1 09/24/2012 0433   PO2ART 132.0* 09/24/2012 0433   HCO3 17.7* 09/24/2012 0433   TCO2 19 09/24/2012 0433   ACIDBASEDEF 8.0* 09/24/2012 0433    O2SAT 66.8 09/24/2012 0554    Radiology: Dg Chest Portable 1 View  09/23/2012  *RADIOLOGY REPORT*  Clinical Data: Postoperative radiograph from mitral valve replacement.  PORTABLE CHEST - 1 VIEW  Comparison: 09/19/2012  Findings: Endotracheal tube 3.7 cm proximal to the carina.  Right IJ Swan-Ganz catheter loops over the right atrium/ventricle, with tip projecting over the main pulmonary artery. Right subclavian central venous catheter loops over the right neck, with tip projecting over the proximal SVC.  Left-sided chest tube and mediastinal drains in place.  NG tube descends below the level of the image, side port at the GE junction.  No confluent airspace opacity.  No definite pleural effusion.  No definite pneumothorax. Cardiomediastinal contours within normal range. Status post median  sternotomy and valve replacement. Otherwise, no acute osseous finding.  IMPRESSION: Postoperative changes.  Support devices as above, including a right IJ Swan-Ganz catheter which loops over the right atrium/ventricle and terminates over the main pulmonary artery.  NG tube side port at the GE junction. Recommend advancement.   Original Report Authenticated By: Jearld Lesch, M.D.      Assessment/Plan: S/P Procedure(s) (LRB): REDO MITRAL VALVE REPLACEMENT (MVR) (N/A) TRICUSPID VALVE REPAIR (N/A)  1. Stable 2 Should be able to wean NO and vent fairly quickly with him being so alert, minimal clinical volume overload. Will need some diuresis. Some degree of metabolic acidosis will need to be monitored. A-a gradient is elevated at 179 3 Wean inotropes , can prob d/c radial aline as not accurate 4 leave CT's for now with moderate drainage 5 creat is rising, will need close observation /support . UO is fair 6 Watch H/H closely, just received 1 unit for HGB 7.0 7 Monitor leukocytosis 8 Rhythm acc junct- cont apace for now  GOLD,WAYNE E 09/24/2012 7:24 AM    patient examined and medical record reviewed,agree with  above note.  Will wean nitric oxide and extubate today Sputum culture VAN TRIGT III,Zaia Carre 09/24/2012

## 2012-09-24 NOTE — Progress Notes (Signed)
Final wean with good VE and VT.

## 2012-09-24 NOTE — Progress Notes (Signed)
Nitric Oxide turned off at 1535.  PAS pressures 52-54.  CI 4.7.  CVP 12.  Dr Donata Clay paged and aware.  Orders received to leave nitric oxide off and proceed with TCTS rapid wean.  Call with pre extubation ABG.  Pre extubation gas called and reported. Orders received to extubate.

## 2012-09-24 NOTE — Progress Notes (Signed)
Placed back to NO due elevated PA pressures, upper 50's

## 2012-09-24 NOTE — Progress Notes (Signed)
TCTS pm rounds  Patient was successfully weaned off nitric oxide and extubated today  Hemodynamics remained stable on low-dose dopamine, milrinone and neo-  Excellent diuresis. Hemoglobin 8.2  Will plan on removing some mediastinal drains and Swan tomorrow and femoral A-line in getting out of bed to chair  Junctional rhythm being atrially paced

## 2012-09-24 NOTE — Procedures (Signed)
Extubation Procedure Note  Patient Details:   Name: Ryan Winters DOB: [birth year] MRN: [id number]   Airway Documentation:  Airway 8 mm (Active)  Secured at (cm) 23 cm 09/24/2012  3:44 PM  Measured From Lips 09/24/2012  3:44 PM  Secured Location Right 09/24/2012  3:44 PM  Secured By Caron Presume Tape 09/24/2012  3:44 PM  Tube Holder Repositioned Yes 09/24/2012  3:44 PM  Site Condition Dry 09/24/2012  3:44 PM    Evaluation  O2 sats: stable throughout Complications: No apparent complications Patient did tolerate procedure well. Bilateral Breath Sounds: Rhonchi Suctioning: Airway Yes NIF and FVC WNL Dr. Donata Clay called with results, positive cuff leak.  Ryan Winters 09/24/2012, 5:14 PM

## 2012-09-24 NOTE — Progress Notes (Signed)
Turned NO off per Dr.Van Trigt.

## 2012-09-25 ENCOUNTER — Encounter (HOSPITAL_COMMUNITY): Payer: Self-pay | Admitting: Cardiothoracic Surgery

## 2012-09-25 ENCOUNTER — Inpatient Hospital Stay (HOSPITAL_COMMUNITY): Payer: Medicaid Other

## 2012-09-25 LAB — CARBOXYHEMOGLOBIN
Carboxyhemoglobin: 2.1 % — ABNORMAL HIGH (ref 0.5–1.5)
Carboxyhemoglobin: 2.2 % — ABNORMAL HIGH (ref 0.5–1.5)
Methemoglobin: 1.2 % (ref 0.0–1.5)
Methemoglobin: 1.2 % (ref 0.0–1.5)
O2 Saturation: 84.7 %
O2 Saturation: 64.9 %
Total hemoglobin: 8.4 g/dL — ABNORMAL LOW (ref 13.5–18.0)
Total hemoglobin: 8.8 g/dL — ABNORMAL LOW (ref 13.5–18.0)

## 2012-09-25 LAB — COMPREHENSIVE METABOLIC PANEL
ALT: 68 U/L — ABNORMAL HIGH (ref 0–53)
AST: 186 U/L — ABNORMAL HIGH (ref 0–37)
Albumin: 2.1 g/dL — ABNORMAL LOW (ref 3.5–5.2)
Alkaline Phosphatase: 69 U/L (ref 39–117)
BUN: 60 mg/dL — ABNORMAL HIGH (ref 6–23)
CO2: 23 mEq/L (ref 19–32)
Calcium: 7.8 mg/dL — ABNORMAL LOW (ref 8.4–10.5)
Chloride: 100 mEq/L (ref 96–112)
Creatinine, Ser: 3.77 mg/dL — ABNORMAL HIGH (ref 0.50–1.35)
GFR calc Af Amer: 20 mL/min — ABNORMAL LOW (ref >90)
GFR calc non Af Amer: 17 mL/min — ABNORMAL LOW (ref >90)
Glucose, Bld: 118 mg/dL — ABNORMAL HIGH (ref 70–99)
Potassium: 3.7 mEq/L (ref 3.5–5.1)
Sodium: 137 mEq/L (ref 135–145)
Total Bilirubin: 1.8 mg/dL — ABNORMAL HIGH (ref 0.3–1.2)
Total Protein: 4.5 g/dL — ABNORMAL LOW (ref 6.0–8.3)

## 2012-09-25 LAB — POCT I-STAT 3, ART BLOOD GAS (G3+)
Acid-base deficit: 1 mmol/L (ref 0.0–2.0)
Bicarbonate: 24.8 mEq/L — ABNORMAL HIGH (ref 20.0–24.0)
O2 Saturation: 96 %
Patient temperature: 36.6
TCO2: 26 mmol/L (ref 0–100)
pCO2 arterial: 43.1 mmHg (ref 35.0–45.0)
pH, Arterial: 7.367 (ref 7.350–7.450)
pO2, Arterial: 80 mmHg (ref 80.0–100.0)

## 2012-09-25 LAB — POCT I-STAT 4, (NA,K, GLUC, HGB,HCT)
Glucose, Bld: 98 mg/dL (ref 70–99)
HCT: 27 % — ABNORMAL LOW (ref 39.0–52.0)
Hemoglobin: 9.2 g/dL — ABNORMAL LOW (ref 13.0–17.0)
Potassium: 4 mEq/L (ref 3.5–5.1)
Sodium: 135 mEq/L (ref 135–145)

## 2012-09-25 LAB — GLUCOSE, CAPILLARY
Glucose-Capillary: 111 mg/dL — ABNORMAL HIGH (ref 70–99)
Glucose-Capillary: 114 mg/dL — ABNORMAL HIGH (ref 70–99)
Glucose-Capillary: 121 mg/dL — ABNORMAL HIGH (ref 70–99)
Glucose-Capillary: 125 mg/dL — ABNORMAL HIGH (ref 70–99)
Glucose-Capillary: 144 mg/dL — ABNORMAL HIGH (ref 70–99)

## 2012-09-25 LAB — CBC
HCT: 23.4 % — ABNORMAL LOW (ref 39.0–52.0)
Hemoglobin: 8 g/dL — ABNORMAL LOW (ref 13.0–17.0)
MCH: 29.5 pg (ref 26.0–34.0)
MCHC: 34.2 g/dL (ref 30.0–36.0)
MCV: 86.3 fL (ref 78.0–100.0)
Platelets: 72 10*3/uL — ABNORMAL LOW (ref 150–400)
RBC: 2.71 MIL/uL — ABNORMAL LOW (ref 4.22–5.81)
RDW: 18.4 % — ABNORMAL HIGH (ref 11.5–15.5)
WBC: 16.3 10*3/uL — ABNORMAL HIGH (ref 4.0–10.5)

## 2012-09-25 LAB — PROTIME-INR: INR: 1.73 — ABNORMAL HIGH (ref 0.00–1.49)

## 2012-09-25 LAB — DRUG SCREEN PANEL (SERUM)

## 2012-09-25 MED ORDER — POTASSIUM CHLORIDE 10 MEQ/50ML IV SOLN
10.0000 meq | INTRAVENOUS | Status: AC
  Administered 2012-09-25 (×2): 10 meq via INTRAVENOUS

## 2012-09-25 MED ORDER — INSULIN ASPART 100 UNIT/ML ~~LOC~~ SOLN
0.0000 [IU] | SUBCUTANEOUS | Status: DC
  Administered 2012-09-26: 2 [IU] via SUBCUTANEOUS

## 2012-09-25 MED FILL — Sodium Chloride IV Soln 0.9%: INTRAVENOUS | Qty: 1000 | Status: AC

## 2012-09-25 MED FILL — Mannitol IV Soln 20%: INTRAVENOUS | Qty: 500 | Status: AC

## 2012-09-25 MED FILL — Heparin Sodium (Porcine) Inj 1000 Unit/ML: INTRAMUSCULAR | Qty: 30 | Status: AC

## 2012-09-25 MED FILL — Electrolyte-R (PH 7.4) Solution: INTRAVENOUS | Qty: 5000 | Status: AC

## 2012-09-25 MED FILL — Calcium Chloride Inj 10%: INTRAVENOUS | Qty: 10 | Status: AC

## 2012-09-25 MED FILL — Sodium Bicarbonate IV Soln 8.4%: INTRAVENOUS | Qty: 50 | Status: AC

## 2012-09-25 MED FILL — Heparin Sodium (Porcine) Inj 1000 Unit/ML: INTRAMUSCULAR | Qty: 20 | Status: AC

## 2012-09-25 MED FILL — Sodium Chloride Irrigation Soln 0.9%: Qty: 3000 | Status: AC

## 2012-09-25 MED FILL — Lidocaine HCl IV Inj 20 MG/ML: INTRAVENOUS | Qty: 5 | Status: AC

## 2012-09-25 NOTE — Care Management Note (Unsigned)
    Page 1 of 1   09/30/2012     3:35:52 PM   CARE MANAGEMENT NOTE 09/30/2012  Patient:  Ryan Winters, Ryan Winters   Account Number:  [id number]  Date Initiated:  09/25/2012  Documentation initiated by:  Avie Arenas  Subjective/Objective Assessment:   Redo MVR on 09-23-12 -     Action/Plan:   PTA, PT INDEPENDENT, LIVES WITH MOTHER, WHO WILL PROVIDE CARE AT DC.   Anticipated DC Date:  10/01/2012   Anticipated DC Plan:  HOME W HOME HEALTH SERVICES      DC Planning Services  CM consult      Choice offered to / List presented to:             Status of service:  In process, will continue to follow Medicare Important Message given?   (If response is "NO", the following Medicare IM given date fields will be blank) Date Medicare IM given:   Date Additional Medicare IM given:    Discharge Disposition:    Per UR Regulation:  Reviewed for med. necessity/level of care/duration of stay  If discussed at Long Length of Stay Meetings, dates discussed:    Comments:  ContactRuel, Tosi Spouse [phone]                 Pinnix,Pauline Mother [phone]  09-25-12 12:30pm Avie Arenas, RNBSN [phone] Now extubated.  Talked with patient at bedside - states lives with mother and she will care for on dc.  Informed we would follow for further needs as he progressed.  Nodded understanding.  09-24-12 12noon - Avie Arenas, RNBSN [phone] Still on vent.

## 2012-09-25 NOTE — Progress Notes (Signed)
Patient ID: Ryan Winters, male   DOB: [birth year], 51 y.o.   MRN: [id number]                   [address]            [place] [address]          [phone]     2 Days Post-Op Procedure(s) (LRB): REDO MITRAL VALVE REPLACEMENT (MVR) (N/A) TRICUSPID VALVE REPAIR (N/A)  Total Length of Stay:  LOS: 2 days  BP 125/78  Pulse 88  Temp 97.2 F (36.2 C) (Oral)  Resp 26  Ht 5\' 6"  (1.676 m)  Wt 157 lb 10.1 oz (71.5 kg)  BMI 25.44 kg/m2  SpO2 95%  .Intake/Output      11/07 0701 - 11/08 0700   P.O. 450   I.V. (mL/kg) 613.5 (8.6)   Blood    NG/GT    IV Piggyback 160   Total Intake(mL/kg) 1223.5 (17.1)   Urine (mL/kg/hr) 2550 (2.8)   Chest Tube 260   Total Output 2810   Net -1586.5            . sodium chloride 20 mL/hr at 09/25/12 1200  . sodium chloride 250 mL (09/24/12 1900)  . DOPamine 3 mcg/kg/min (09/25/12 1900)  . epinephrine Stopped (09/25/12 1400)  . milrinone 0.25 mcg/kg/min (09/25/12 1900)  . phenylephrine (NEO-SYNEPHRINE) Adult infusion Stopped (09/25/12 1500)  . [DISCONTINUED] sodium chloride Stopped (09/25/12 0831)  . [DISCONTINUED] dexmedetomidine Stopped (09/24/12 1645)     Lab Results  Component Value Date   WBC 16.3* 09/25/2012   HGB 9.2* 09/25/2012   HCT 27.0* 09/25/2012   PLT 72* 09/25/2012   GLUCOSE 98 09/25/2012   CHOL  Value: 134        ATP III CLASSIFICATION:  <200     mg/dL   Desirable  962-952  mg/dL   Borderline High  >=841    mg/dL   High        01/19/4400   TRIG 39 05/25/2010   HDL 63 05/25/2010   LDLCALC  Value: 63        Total Cholesterol/HDL:CHD Risk Coronary Heart Disease Risk Table                     Men   Women  1/2 Average Risk   3.4   3.3  Average Risk       5.0   4.4  2 X Average Risk   9.6   7.1  3 X Average Risk  23.4   11.0        Use the calculated Patient Ratio above and the CHD Risk Table to determine the patient's CHD Risk.        ATP III CLASSIFICATION (LDL):  <100     mg/dL   Optimal  027-253  mg/dL   Near or Above                     Optimal  130-159  mg/dL   Borderline  664-403  mg/dL   High  >474     mg/dL   Very High 12/24/9561   ALT 68* 09/25/2012   AST 186* 09/25/2012   NA 135 09/25/2012   K 4.0 09/25/2012   CL 100 09/25/2012   CREATININE 3.77* 09/25/2012   BUN 60* 09/25/2012   CO2 23 09/25/2012   TSH 1.871 05/09/2012   INR 1.73* 09/25/2012   HGBA1C 4.7 09/19/2012  MICROALBUR 0.64 05/25/2010   Now off vent  still on dopamine and milrinone  2.5 l uop today on lasix   Delight Ovens MD  Beeper [phone] Office [phone] 09/25/2012 7:42 PM

## 2012-09-25 NOTE — Progress Notes (Addendum)
TCTS DAILY PROGRESS NOTE                   [address]            [place] [address]          [phone]      2 Days Post-Op Procedure(s) (LRB): REDO MITRAL VALVE REPLACEMENT (MVR) (N/A) TRICUSPID VALVE REPAIR (N/A)  Total Length of Stay:  LOS: 2 days   Subjective: Extubated, very pleasant and conversant, no specific c/o  Objective: Vital signs in last 24 hours: Temp:  [94.8 F (34.9 C)-99.9 F (37.7 C)] 97.7 F (36.5 C) (11/07 0700) Pulse Rate:  [78-95] 90  (11/07 0700) Cardiac Rhythm:  [-] Atrial paced (11/06 2000) Resp:  [12-31] 30  (11/07 0600) BP: (77-104)/(14-56) 104/54 mmHg (11/07 0700) SpO2:  [96 %-100 %] 99 % (11/07 0700) Arterial Line BP: (103-128)/(40-53) 125/53 mmHg (11/07 0700) FiO2 (%):  [40 %-50 %] 40 % (11/06 1629) Weight:  [157 lb 10.1 oz (71.5 kg)] 157 lb 10.1 oz (71.5 kg) (11/07 0600)  Filed Weights   09/22/12 1300 09/24/12 0500 09/25/12 0600  Weight: 151 lb (68.493 kg) 160 lb 7.9 oz (72.8 kg) 157 lb 10.1 oz (71.5 kg)   Weight change: -2 lb 13.9 oz (-1.3 kg)   Hemodynamic parameters for last 24 hours: PAP: (30-65)/(13-21) 64/20 mmHg CVP:  [5 mmHg-14 mmHg] 10 mmHg CO:  [6.6 L/min-9.3 L/min] 9.3 L/min CI:  [3.7 L/min/m2-7.3 L/min/m2] 5.2 L/min/m2  Intake/Output from previous day: 11/06 0701 - 11/07 0700 In: 3891.5 [P.O.:245; I.V.:2292.5; Blood:700; NG/GT:200; IV Piggyback:454] Out: 3520 [Urine:3310; Chest Tube:210]  Intake/Output this shift:    Current Meds: Scheduled Meds:   . acetaminophen  1,000 mg Oral Q6H   Or  . acetaminophen (TYLENOL) oral liquid 160 mg/5 mL  975 mg Per Tube Q6H  . antiseptic oral rinse  15 mL Mouth Rinse QID  . aspirin EC  325 mg Oral Daily   Or  . aspirin  324 mg Per Tube Daily  . bisacodyl  10 mg Oral Daily   Or  . bisacodyl  10 mg Rectal Daily  . budesonide-formoterol  2 puff Inhalation BID  . cefUROXime (ZINACEF)  IV  1.5 g Intravenous Q12H  . chlorhexidine  15 mL Mouth Rinse BID  .  docusate sodium  200 mg Oral Daily  . [COMPLETED] famotidine (PEPCID) IV  20 mg Intravenous Q12H  . furosemide  40 mg Intravenous BID  . insulin aspart  0-24 Units Subcutaneous Q4H  . levalbuterol  1.25 mg Nebulization TID  . metoprolol tartrate  12.5 mg Oral BID   Or  . metoprolol tartrate  12.5 mg Per Tube BID  . pantoprazole  40 mg Oral Daily  . [COMPLETED] potassium chloride  10 mEq Intravenous Q1 Hr x 2  . sildenafil  20 mg Oral BID AC  . sodium chloride  3 mL Intravenous Q12H  . [DISCONTINUED] vancomycin  1,000 mg Intravenous Q24 Hr x 2   Continuous Infusions:   . sodium chloride 20 mL/hr at 09/25/12 0700  . sodium chloride 20 mL/hr at 09/25/12 0700  . sodium chloride 250 mL (09/24/12 1900)  . dexmedetomidine Stopped (09/24/12 1645)  . DOPamine 3 mcg/kg/min (09/25/12 0700)  . epinephrine 2 mcg/min (09/25/12 0700)  . milrinone 0.375 mcg/kg/min (09/25/12 0700)  . phenylephrine (NEO-SYNEPHRINE) Adult infusion 30 mcg/min (09/25/12 0700)  . [DISCONTINUED] lactated ringers Stopped (09/23/12 2000)  . [DISCONTINUED] milrinone 0.5 mcg/kg/min (09/24/12 1401)  . [  DISCONTINUED] nitroGLYCERIN Stopped (09/23/12 1830)   PRN Meds:.[EXPIRED] albumin human, metoprolol, morphine injection, ondansetron (ZOFRAN) IV, oxyCODONE, sodium chloride, [DISCONTINUED] midazolam  General appearance: alert, appears stated age and no distress Neurologic: intact Heart: regular rate and rhythm and systolic murmur Lungs: clear anteriorly Abdomen: soft, nontender, nondistended Extremities: no edema Wound: dressings CDI  Lab Results: CBC: Basename 09/25/12 0500 09/24/12 1706 09/24/12 1657  WBC 16.3* -- 16.0*  HGB 8.0* 8.2* --  HCT 23.4* 24.0* --  PLT 72* -- 90*   BMET:  Basename 09/25/12 0500 09/24/12 1706 09/24/12 0443  NA 137 139 --  K 3.7 3.5 --  CL 100 101 --  CO2 23 -- 17*  GLUCOSE 118* 141* --  BUN 60* 56* --  CREATININE 3.77* 3.50* --  CALCIUM 7.8* -- 8.5    PT/INR:  Basename 09/25/12  0500  LABPROT 19.7*  INR 1.73*   ABG    Component Value Date/Time   PHART 7.367 09/25/2012 0541   PCO2ART 43.1 09/25/2012 0541   PO2ART 80.0 09/25/2012 0541   HCO3 24.8* 09/25/2012 0541   TCO2 26 09/25/2012 0541   ACIDBASEDEF 1.0 09/25/2012 0541   O2SAT 84.7 09/25/2012 0543    Radiology: Dg Chest Portable 1 View In Am  09/24/2012  *RADIOLOGY REPORT*  Clinical Data: Postoperative day #1, status post redo mitral valve replacement and tricuspid valve repair; pericarditis and mitral repair.  PORTABLE CHEST - 1 VIEW  Comparison: Multiple exams, including 09/23/2012  Findings: Endotracheal tube tip 3.3 cm above carina.  Nasogastric tube noted with side port in the distal esophagus; tubing is coiled in the neck region, and I cannot exclude some pharyngeal coiling of the nasogastric tube.  Bilateral chest tubes and pericardial drain are unchanged in position.  Right internal jugular Swan-Ganz catheter noted with tip projecting over the main pulmonary artery. The right internal jugular central line is present with tip projecting over the SVC.  Prosthetic mitral and tricuspid valves noted mild cardiomegaly is present with cardiothoracic index of 0.62.  No pneumothorax observed.  There is potentially mild pulmonary venous hypertension without overt edema.  Suspected mild right retrocardiac atelectasis.  No pleural fluid collection is observed.  IMPRESSION:  1.  Cardiomegaly, with borderline pulmonary venous hypertension. 2.  The nasogastric tube side port is in the distal esophagus, and coiled tubing over the neck may be outside the patient but could represent a coil of the nasogastric tube in the cervical esophagus. 3.  Indistinct right retrocardiac density, suspicious for atelectasis.   Original Report Authenticated By: Gaylyn Rong, M.D.    Dg Chest Portable 1 View  09/23/2012  *RADIOLOGY REPORT*  Clinical Data: Postoperative radiograph from mitral valve replacement.  PORTABLE CHEST - 1 VIEW  Comparison:  09/19/2012  Findings: Endotracheal tube 3.7 cm proximal to the carina.  Right IJ Swan-Ganz catheter loops over the right atrium/ventricle, with tip projecting over the main pulmonary artery. Right subclavian central venous catheter loops over the right neck, with tip projecting over the proximal SVC.  Left-sided chest tube and mediastinal drains in place.  NG tube descends below the level of the image, side port at the GE junction.  No confluent airspace opacity.  No definite pleural effusion.  No definite pneumothorax. Cardiomediastinal contours within normal range. Status post median sternotomy and valve replacement. Otherwise, no acute osseous finding.  IMPRESSION: Postoperative changes.  Support devices as above, including a right IJ Swan-Ganz catheter which loops over the right atrium/ventricle and terminates over the main pulmonary artery.  NG tube side port at the GE junction. Recommend advancement.   Original Report Authenticated By: Jearld Lesch, M.D.     Assessment/Plan: S/P Procedure(s) (LRB): REDO MITRAL VALVE REPLACEMENT (MVR) (N/A) TRICUSPID VALVE REPAIR (N/A)  1. Extubated, push pulm toilet, pa pressures elevated off NO ,revatio started. 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  Cont low dose MIL, dopa Follow creatinine VAN TRIGT III,PETER 09/25/2012

## 2012-09-26 ENCOUNTER — Inpatient Hospital Stay (HOSPITAL_COMMUNITY): Payer: Medicaid Other

## 2012-09-26 LAB — GLUCOSE, CAPILLARY
Glucose-Capillary: 101 mg/dL — ABNORMAL HIGH (ref 70–99)
Glucose-Capillary: 155 mg/dL — ABNORMAL HIGH (ref 70–99)
Glucose-Capillary: 69 mg/dL — ABNORMAL LOW (ref 70–99)
Glucose-Capillary: 85 mg/dL (ref 70–99)
Glucose-Capillary: 91 mg/dL (ref 70–99)
Glucose-Capillary: 92 mg/dL (ref 70–99)
Glucose-Capillary: 93 mg/dL (ref 70–99)
Glucose-Capillary: 94 mg/dL (ref 70–99)

## 2012-09-26 LAB — CBC
HCT: 24.2 % — ABNORMAL LOW (ref 39.0–52.0)
Hemoglobin: 8.2 g/dL — ABNORMAL LOW (ref 13.0–17.0)
MCH: 29.2 pg (ref 26.0–34.0)
MCHC: 33.9 g/dL (ref 30.0–36.0)
MCV: 86.1 fL (ref 78.0–100.0)
Platelets: 56 10*3/uL — ABNORMAL LOW (ref 150–400)
RBC: 2.81 MIL/uL — ABNORMAL LOW (ref 4.22–5.81)
RDW: 17.8 % — ABNORMAL HIGH (ref 11.5–15.5)
WBC: 13.5 10*3/uL — ABNORMAL HIGH (ref 4.0–10.5)

## 2012-09-26 LAB — BASIC METABOLIC PANEL
BUN: 63 mg/dL — ABNORMAL HIGH (ref 6–23)
CO2: 25 mEq/L (ref 19–32)
Calcium: 8.3 mg/dL — ABNORMAL LOW (ref 8.4–10.5)
Chloride: 99 mEq/L (ref 96–112)
Creatinine, Ser: 3.57 mg/dL — ABNORMAL HIGH (ref 0.50–1.35)
GFR calc Af Amer: 21 mL/min — ABNORMAL LOW (ref >90)
GFR calc non Af Amer: 18 mL/min — ABNORMAL LOW (ref >90)
Glucose, Bld: 91 mg/dL (ref 70–99)
Potassium: 4.3 mEq/L (ref 3.5–5.1)
Sodium: 134 mEq/L — ABNORMAL LOW (ref 135–145)

## 2012-09-26 LAB — CULTURE, RESPIRATORY W GRAM STAIN

## 2012-09-26 MED ORDER — DOPAMINE-DEXTROSE 3.2-5 MG/ML-% IV SOLN: 0.0000 ug/kg/min | INTRAVENOUS | Status: DC

## 2012-09-26 MED ORDER — AMIODARONE HCL 200 MG PO TABS
200.0000 mg | ORAL_TABLET | Freq: Two times a day (BID) | ORAL | Status: DC
  Administered 2012-09-26 – 2012-09-27 (×4): 200 mg via ORAL
  Filled 2012-09-26 (×6): qty 1

## 2012-09-26 MED ORDER — METOCLOPRAMIDE HCL 5 MG/ML IJ SOLN
5.0000 mg | Freq: Four times a day (QID) | INTRAMUSCULAR | Status: DC
  Administered 2012-09-26 – 2012-09-27 (×4): 5 mg via INTRAVENOUS
  Filled 2012-09-26 (×7): qty 1

## 2012-09-26 MED ORDER — WARFARIN - PHYSICIAN DOSING INPATIENT
Freq: Every day | Status: DC
  Administered 2012-09-26 – 2012-09-30 (×2)

## 2012-09-26 MED ORDER — AMIODARONE IV BOLUS ONLY 150 MG/100ML
150.0000 mg | Freq: Once | INTRAVENOUS | Status: AC
  Administered 2012-09-26: 150 mg via INTRAVENOUS
  Filled 2012-09-26: qty 100

## 2012-09-26 MED ORDER — BOOST / RESOURCE BREEZE PO LIQD
1.0000 | Freq: Three times a day (TID) | ORAL | Status: DC
  Administered 2012-09-26 – 2012-09-30 (×9): 1 via ORAL

## 2012-09-26 MED ORDER — WARFARIN SODIUM 2.5 MG PO TABS
2.5000 mg | ORAL_TABLET | Freq: Every day | ORAL | Status: DC
  Administered 2012-09-26 – 2012-09-27 (×2): 2.5 mg via ORAL
  Filled 2012-09-26 (×3): qty 1

## 2012-09-26 MED ORDER — METOPROLOL TARTRATE 12.5 MG HALF TABLET: 12.5000 mg | ORAL_TABLET | Freq: Two times a day (BID) | ORAL | Status: DC

## 2012-09-26 NOTE — Progress Notes (Signed)
Patient ID: Ryan Winters, male   DOB: [birth year], 51 y.o.   MRN: [id number]  SICU Evening Rounds:  Hemodynamically stable on dop 3  Ambulated well.  Diuresing fairly well  CBC    Component Value Date/Time   WBC 13.5* 09/26/2012 0445   RBC 2.81* 09/26/2012 0445   HGB 8.2* 09/26/2012 0445   HCT 24.2* 09/26/2012 0445   PLT 56* 09/26/2012 0445   MCV 86.1 09/26/2012 0445   MCH 29.2 09/26/2012 0445   MCHC 33.9 09/26/2012 0445   RDW 17.8* 09/26/2012 0445   LYMPHSABS 1.0 09/04/2012 2035   MONOABS 0.7 09/04/2012 2035   EOSABS 0.4 09/04/2012 2035   BASOSABS 0.0 09/04/2012 2035    BMET    Component Value Date/Time   NA 134* 09/26/2012 0445   K 4.3 09/26/2012 0445   CL 99 09/26/2012 0445   CO2 25 09/26/2012 0445   GLUCOSE 91 09/26/2012 0445   BUN 63* 09/26/2012 0445   CREATININE 3.57* 09/26/2012 0445   CALCIUM 8.3* 09/26/2012 0445   CALCIUM PENDING 06/28/2011 1331   CALCIUM 9.3 06/28/2011 1331   GFRNONAA 18* 09/26/2012 0445   GFRAA 21* 09/26/2012 0445

## 2012-09-26 NOTE — Progress Notes (Signed)
Nutrition Follow-up  Intervention:    Resource Breeze supplement daily (250 kcals, 9 gm protein per 8 fl oz carton) RD to follow for nutrition care plan  Assessment:   Patient extubated 11/6.  Diet advanced to Regular 11/7.  States he vomiting his coffee this AM.  PO intake 50% per flowsheet records.  Would like to try [employer] supplement -- RD to order.  Diet Order:  Regular  Meds: Scheduled Meds:   . acetaminophen  1,000 mg Oral Q6H   Or  . acetaminophen (TYLENOL) oral liquid 160 mg/5 mL  975 mg Per Tube Q6H  . [COMPLETED] amiodarone  150 mg Intravenous Once  . amiodarone  200 mg Oral BID  . antiseptic oral rinse  15 mL Mouth Rinse QID  . aspirin EC  325 mg Oral Daily   Or  . aspirin  324 mg Per Tube Daily  . bisacodyl  10 mg Oral Daily   Or  . bisacodyl  10 mg Rectal Daily  . budesonide-formoterol  2 puff Inhalation BID  . [COMPLETED] cefUROXime (ZINACEF)  IV  1.5 g Intravenous Q12H  . docusate sodium  200 mg Oral Daily  . furosemide  40 mg Intravenous BID  . insulin aspart  0-24 Units Subcutaneous Q4H  . levalbuterol  1.25 mg Nebulization TID  . metoprolol tartrate  12.5 mg Oral BID  . pantoprazole  40 mg Oral Daily  . [COMPLETED] potassium chloride  10 mEq Intravenous Q1 Hr x 2  . sildenafil  20 mg Oral BID AC  . sodium chloride  3 mL Intravenous Q12H  . warfarin  2.5 mg Oral q1800  . Warfarin - Physician Dosing Inpatient   Does not apply q1800  . [DISCONTINUED] chlorhexidine  15 mL Mouth Rinse BID  . [DISCONTINUED] insulin aspart  0-24 Units Subcutaneous Q4H  . [DISCONTINUED] metoprolol tartrate  12.5 mg Oral BID   Continuous Infusions:   . sodium chloride 20 mL/hr at 09/26/12 0800  . sodium chloride 250 mL (09/24/12 1900)  . DOPamine 3 mcg/kg/min (09/26/12 1100)  . phenylephrine (NEO-SYNEPHRINE) Adult infusion Stopped (09/25/12 1500)  . [DISCONTINUED] epinephrine Stopped (09/25/12 1400)  . [DISCONTINUED] milrinone 0.125 mcg/kg/min (09/26/12 0900)    PRN Meds:.metoprolol, morphine injection, ondansetron (ZOFRAN) IV, oxyCODONE, sodium chloride   CMP     Component Value Date/Time   NA 134* 09/26/2012 0445   K 4.3 09/26/2012 0445   CL 99 09/26/2012 0445   CO2 25 09/26/2012 0445   GLUCOSE 91 09/26/2012 0445   BUN 63* 09/26/2012 0445   CREATININE 3.57* 09/26/2012 0445   CALCIUM 8.3* 09/26/2012 0445   CALCIUM PENDING 06/28/2011 1331   CALCIUM 9.3 06/28/2011 1331   PROT 4.5* 09/25/2012 0500   ALBUMIN 2.1* 09/25/2012 0500   AST 186* 09/25/2012 0500   ALT 68* 09/25/2012 0500   ALKPHOS 69 09/25/2012 0500   BILITOT 1.8* 09/25/2012 0500   GFRNONAA 18* 09/26/2012 0445   GFRAA 21* 09/26/2012 0445    CBG (last 3)   Basename 09/26/12 0840 09/26/12 0344 09/25/12 2325  GLUCAP 94 93 101*     Intake/Output Summary (Last 24 hours) at 09/26/12 1251 Last data filed at 09/26/12 1120  Gross per 24 hour  Intake 1163.08 ml  Output   3260 ml  Net -2096.92 ml    Weight Status:  70.1 kg (11/8) -- down likely due to diuresis  Re-estimated needs:  2000-2200 kcals, 100-110 gm protein  Nutrition Dx:  Inadequate Oral Intake now r/t decreased  appetite, vomiting as evidenced by patient report, ongoing  New Goal:  Oral intake with meals & supplements to meet >/= 90% of estimated nutrition needs, currently unmet  Monitor:  PO & supplemental intake, weight, labs, I/O's  Kirkland Hun, RD, LDN Pager #: [phone] After-Hours Pager #: [phone]

## 2012-09-26 NOTE — Progress Notes (Addendum)
[facility] DAILY PROGRESS NOTE                   [address]            [place] [address]          [phone]      3 Days Post-Op Procedure(s) (LRB): REDO MITRAL VALVE REPLACEMENT (MVR) (N/A) TRICUSPID VALVE REPAIR (N/A)  Total Length of Stay:  LOS: 3 days   Subjective: Feels better, mild nausea, getting stronger  Objective: Vital signs in last 24 hours: Temp:  [97.2 F (36.2 C)-98.2 F (36.8 C)] 98.2 F (36.8 C) (11/08 0343) Pulse Rate:  [78-98] 86  (11/08 0700) Cardiac Rhythm:  [-] Atrial paced (11/08 0000) Resp:  [14-31] 30  (11/08 0700) BP: (90-155)/(48-86) 155/86 mmHg (11/08 0700) SpO2:  [87 %-98 %] 95 % (11/08 0700) Arterial Line BP: (110-119)/(47-54) 110/47 mmHg (11/07 0900) Weight:  [154 lb 8.7 oz (70.1 kg)] 154 lb 8.7 oz (70.1 kg) (11/08 0600)  Filed Weights   09/24/12 0500 09/25/12 0600 09/26/12 0600  Weight: 160 lb 7.9 oz (72.8 kg) 157 lb 10.1 oz (71.5 kg) 154 lb 8.7 oz (70.1 kg)    Weight change: -3 lb 1.4 oz (-1.4 kg)   Hemodynamic parameters for last 24 hours: PAP: (46-78)/(16-23) 78/23 mmHg CVP:  [4 mmHg-17 mmHg] 5 mmHg CO:  [9.2 L/min] 9.2 L/min CI:  [5.2 L/min/m2] 5.2 L/min/m2  Intake/Output from previous day: 11/07 0701 - 11/08 0700 In: 1542.5 [P.O.:450; I.V.:932.5; IV Piggyback:160] Out: 4015 [Urine:3725; Chest Tube:290]  Intake/Output this shift:    Current Meds: Scheduled Meds:   . acetaminophen  1,000 mg Oral Q6H   Or  . acetaminophen (TYLENOL) oral liquid 160 mg/5 mL  975 mg Per Tube Q6H  . antiseptic oral rinse  15 mL Mouth Rinse QID  . aspirin EC  325 mg Oral Daily   Or  . aspirin  324 mg Per Tube Daily  . bisacodyl  10 mg Oral Daily   Or  . bisacodyl  10 mg Rectal Daily  . budesonide-formoterol  2 puff Inhalation BID  . [COMPLETED] cefUROXime (ZINACEF)  IV  1.5 g Intravenous Q12H  . docusate sodium  200 mg Oral Daily  . furosemide  40 mg Intravenous BID  . insulin aspart  0-24 Units Subcutaneous Q4H  .  levalbuterol  1.25 mg Nebulization TID  . metoprolol tartrate  12.5 mg Oral BID  . pantoprazole  40 mg Oral Daily  . [COMPLETED] potassium chloride  10 mEq Intravenous Q1 Hr x 2  . [COMPLETED] potassium chloride  10 mEq Intravenous Q1 Hr x 2  . sildenafil  20 mg Oral BID AC  . sodium chloride  3 mL Intravenous Q12H  . [DISCONTINUED] chlorhexidine  15 mL Mouth Rinse BID  . [DISCONTINUED] insulin aspart  0-24 Units Subcutaneous Q4H  . [DISCONTINUED] metoprolol tartrate  12.5 mg Per Tube BID   Continuous Infusions:   . sodium chloride 20 mL/hr at 09/26/12 0700  . sodium chloride 250 mL (09/24/12 1900)  . DOPamine 3 mcg/kg/min (09/26/12 0700)  . epinephrine Stopped (09/25/12 1400)  . milrinone 0.25 mcg/kg/min (09/26/12 0700)  . phenylephrine (NEO-SYNEPHRINE) Adult infusion Stopped (09/25/12 1500)  . [DISCONTINUED] sodium chloride Stopped (09/25/12 0831)  . [DISCONTINUED] dexmedetomidine Stopped (09/24/12 1645)   PRN Meds:.metoprolol, morphine injection, ondansetron (ZOFRAN) IV, oxyCODONE, sodium chloride  General appearance: alert, cooperative and no distress Heart: regular rate and rhythm and soft syst murmur Lungs: dim  R>L base Abdomen: soft, nontender, + BS, mild distension Extremities: no edema Wound: dressings dry and intact  Lab Results: CBC: Basename 09/26/12 0445 09/25/12 1532 09/25/12 0500  WBC 13.5* -- 16.3*  HGB 8.2* 9.2* --  HCT 24.2* 27.0* --  PLT 56* -- 72*   BMET:  Basename 09/26/12 0445 09/25/12 1532 09/25/12 0500  NA 134* 135 --  K 4.3 4.0 --  CL 99 -- 100  CO2 25 -- 23  GLUCOSE 91 98 --  BUN 63* -- 60*  CREATININE 3.57* -- 3.77*  CALCIUM 8.3* -- 7.8*    PT/INR:  Basename 09/26/12 0445  LABPROT 17.7*  INR 1.50*   ABG    Component Value Date/Time   PHART 7.367 09/25/2012 0541   PCO2ART 43.1 09/25/2012 0541   PO2ART 80.0 09/25/2012 0541   HCO3 24.8* 09/25/2012 0541   TCO2 26 09/25/2012 0541   ACIDBASEDEF 1.0 09/25/2012 0541   O2SAT 64.9 09/25/2012  1530    Radiology: Dg Chest Port 1 View  09/25/2012  *RADIOLOGY REPORT*  Clinical Data: Mitral valve replacement.  PORTABLE CHEST - 1 VIEW  Comparison: Chest x-ray 09/24/2012.  Findings: The patient has been extubated, and the previously noted nasogastric tube has been removed.  Right IJ Cordis is in position, through which a Swan Ganz catheter has been passed into the proximal left main pulmonary artery.  Additional right-sided catheter (likely right IJ) with tip terminating in the proximal superior vena cava.  The bilateral chest tubes in position with tips and side ports projecting over the thorax.  Mediastinal drains remain in position.  Epicardial pacing wires are noted.  Status post median sternotomy for mitral valve replacement (likely a stented bioprosthesis) and tricuspid annuloplasty.  Worsening aeration throughout the right hemithorax, particularly at the right base, favored to reflect increasing moderate right pleural effusion and associated passive atelectasis.  There is also some increased cephalization of the pulmonary vasculature, and some interstitial distinctness in the lungs bilaterally, which could reflect some developing interstitial pulmonary edema.  No definite pneumothorax.  IMPRESSION: 1.  Postoperative changes and support apparatus, as above. 2.  Worsening aeration favored to reflects some developing mild interstitial pulmonary edema, moderate right-sided pleural effusion and associated passive atelectasis at the right base.   Original Report Authenticated By: Trudie Reed, M.D.      Assessment/Plan: S/P Procedure(s) (LRB): REDO MITRAL VALVE REPLACEMENT (MVR) (N/A) TRICUSPID VALVE REPAIR (N/A)  1. Steady clinical improvement, getting stronger 2 some hypertension, wean inotropes as able, may need vasodilatory agent, on Revatio, will have to monitor dosing with renal insuff. 3 renal fxn stable with good UO, cont to diurese, not ACE Inhibitor candidate at this time 4 H/H  stable 5 moderate CT drainage- cont for now 6 leukocytosis conts to improve 7 CBG's fine 8 push pulm toilet as able 9 nutrition as able 10 pacer not capturing, junct in 80's currently with VVI backup GOLD,WAYNE E 09/26/2012 7:31 AM  start coumadin slowly rhythm now afib - po amio and lopressor Cont renal dopa for creat > 3.0

## 2012-09-27 ENCOUNTER — Inpatient Hospital Stay (HOSPITAL_COMMUNITY): Payer: Medicaid Other

## 2012-09-27 LAB — CBC
HCT: 24.3 % — ABNORMAL LOW (ref 39.0–52.0)
Hemoglobin: 8.2 g/dL — ABNORMAL LOW (ref 13.0–17.0)
MCH: 29.5 pg (ref 26.0–34.0)
MCHC: 33.7 g/dL (ref 30.0–36.0)
MCV: 87.4 fL (ref 78.0–100.0)
Platelets: 63 10*3/uL — ABNORMAL LOW (ref 150–400)
RBC: 2.78 MIL/uL — ABNORMAL LOW (ref 4.22–5.81)
RDW: 17.7 % — ABNORMAL HIGH (ref 11.5–15.5)
WBC: 10.1 10*3/uL (ref 4.0–10.5)

## 2012-09-27 LAB — TYPE AND SCREEN
ABO/RH(D): A POS
Antibody Screen: NEGATIVE
Unit division: 0
Unit division: 0
Unit division: 0
Unit division: 0
Unit division: 0
Unit division: 0

## 2012-09-27 LAB — COMPREHENSIVE METABOLIC PANEL
ALT: 62 U/L — ABNORMAL HIGH (ref 0–53)
AST: 116 U/L — ABNORMAL HIGH (ref 0–37)
Albumin: 2.4 g/dL — ABNORMAL LOW (ref 3.5–5.2)
Alkaline Phosphatase: 108 U/L (ref 39–117)
BUN: 59 mg/dL — ABNORMAL HIGH (ref 6–23)
CO2: 26 mEq/L (ref 19–32)
Calcium: 8.5 mg/dL (ref 8.4–10.5)
Chloride: 97 mEq/L (ref 96–112)
Creatinine, Ser: 3.1 mg/dL — ABNORMAL HIGH (ref 0.50–1.35)
GFR calc Af Amer: 25 mL/min — ABNORMAL LOW (ref >90)
GFR calc non Af Amer: 22 mL/min — ABNORMAL LOW (ref >90)
Glucose, Bld: 89 mg/dL (ref 70–99)
Potassium: 3.6 mEq/L (ref 3.5–5.1)
Sodium: 135 mEq/L (ref 135–145)
Total Bilirubin: 1.3 mg/dL — ABNORMAL HIGH (ref 0.3–1.2)
Total Protein: 5.8 g/dL — ABNORMAL LOW (ref 6.0–8.3)

## 2012-09-27 LAB — GLUCOSE, CAPILLARY
Glucose-Capillary: 105 mg/dL — ABNORMAL HIGH (ref 70–99)
Glucose-Capillary: 111 mg/dL — ABNORMAL HIGH (ref 70–99)
Glucose-Capillary: 91 mg/dL (ref 70–99)
Glucose-Capillary: 96 mg/dL (ref 70–99)

## 2012-09-27 LAB — PROTIME-INR
INR: 1.16 (ref 0.00–1.49)
Prothrombin Time: 14.6 seconds (ref 11.6–15.2)

## 2012-09-27 MED ORDER — MOVING RIGHT ALONG BOOK
Freq: Once | Status: AC
  Administered 2012-09-27: 16:00:00
  Filled 2012-09-27: qty 1

## 2012-09-27 MED ORDER — POTASSIUM CHLORIDE 10 MEQ/50ML IV SOLN
INTRAVENOUS | Status: AC
  Filled 2012-09-27: qty 100

## 2012-09-27 MED ORDER — ONDANSETRON HCL 4 MG/2ML IJ SOLN: 4.0000 mg | Freq: Four times a day (QID) | INTRAMUSCULAR | Status: DC | PRN

## 2012-09-27 MED ORDER — SODIUM CHLORIDE 0.9 % IJ SOLN
3.0000 mL | Freq: Two times a day (BID) | INTRAMUSCULAR | Status: DC
  Administered 2012-09-27 – 2012-09-30 (×7): 3 mL via INTRAVENOUS

## 2012-09-27 MED ORDER — BISACODYL 10 MG RE SUPP: 10.0000 mg | Freq: Every day | RECTAL | Status: DC | PRN

## 2012-09-27 MED ORDER — FERROUS GLUCONATE 324 (38 FE) MG PO TABS
324.0000 mg | ORAL_TABLET | Freq: Two times a day (BID) | ORAL | Status: DC
  Administered 2012-09-27 – 2012-10-01 (×8): 324 mg via ORAL
  Filled 2012-09-27 (×10): qty 1

## 2012-09-27 MED ORDER — SODIUM CHLORIDE 0.9 % IV SOLN: 250.0000 mL | INTRAVENOUS | Status: DC | PRN

## 2012-09-27 MED ORDER — ONDANSETRON HCL 4 MG PO TABS: 4.0000 mg | ORAL_TABLET | Freq: Four times a day (QID) | ORAL | Status: DC | PRN

## 2012-09-27 MED ORDER — SODIUM CHLORIDE 0.9 % IJ SOLN: 3.0000 mL | INTRAMUSCULAR | Status: DC | PRN

## 2012-09-27 MED ORDER — DOCUSATE SODIUM 100 MG PO CAPS
200.0000 mg | ORAL_CAPSULE | Freq: Every day | ORAL | Status: DC
  Administered 2012-09-28 – 2012-09-30 (×3): 200 mg via ORAL
  Filled 2012-09-27 (×5): qty 2

## 2012-09-27 MED ORDER — FUROSEMIDE 80 MG PO TABS
80.0000 mg | ORAL_TABLET | Freq: Every day | ORAL | Status: DC
  Administered 2012-09-27 – 2012-09-28 (×2): 80 mg via ORAL
  Filled 2012-09-27 (×2): qty 1

## 2012-09-27 MED ORDER — BISACODYL 5 MG PO TBEC
10.0000 mg | DELAYED_RELEASE_TABLET | Freq: Every day | ORAL | Status: DC | PRN
  Administered 2012-09-28 – 2012-09-30 (×3): 10 mg via ORAL
  Filled 2012-09-27 (×3): qty 2

## 2012-09-27 NOTE — Progress Notes (Signed)
Patient ambulated  600 ft in hall with mother. Patient  tolerated well.

## 2012-09-27 NOTE — Progress Notes (Signed)
4 Days Post-Op Procedure(s) (LRB): REDO MITRAL VALVE REPLACEMENT (MVR) (N/A) TRICUSPID VALVE REPAIR (N/A) Subjective: No complaints  Objective: Vital signs in last 24 hours: Temp:  [97.4 F (36.3 C)-98.2 F (36.8 C)] 98.2 F (36.8 C) (11/09 0726) Pulse Rate:  [59-79] 62  (11/09 0900) Cardiac Rhythm:  [-] Normal sinus rhythm (11/09 1000) Resp:  [12-31] 25  (11/09 1000) BP: (122-179)/(63-85) 133/65 mmHg (11/09 0900) SpO2:  [88 %-100 %] 100 % (11/09 0900) Weight:  [68 kg (149 lb 14.6 oz)] 68 kg (149 lb 14.6 oz) (11/09 0446)  Hemodynamic parameters for last 24 hours: CVP:  [6 mmHg-15 mmHg] 9 mmHg  Intake/Output from previous day: 11/08 0701 - 11/09 0700 In: 2310.7 [P.O.:1250; I.V.:1054.7; IV Piggyback:6] Out: 3850 [Urine:3730; Chest Tube:120] Intake/Output this shift: Total I/O In: 64 [I.V.:60; IV Piggyback:4] Out: 1000 [Urine:800; Chest Tube:200]  General appearance: alert and cooperative Neurologic: intact Heart: regular rate and rhythm, S1, S2 normal, no murmur, click, rub or gallop Lungs: diminished breath sounds RLL Extremities: extremities normal, atraumatic, no cyanosis or edema Wound: incision ok  Lab Results:  Basename 09/27/12 0400 09/26/12 0445  WBC 10.1 13.5*  HGB 8.2* 8.2*  HCT 24.3* 24.2*  PLT 63* 56*   BMET:  Basename 09/27/12 0400 09/26/12 0445  NA 135 134*  K 3.6 4.3  CL 97 99  CO2 26 25  GLUCOSE 89 91  BUN 59* 63*  CREATININE 3.10* 3.57*  CALCIUM 8.5 8.3*    PT/INR:  Basename 09/27/12 0400  LABPROT 14.6  INR 1.16   ABG    Component Value Date/Time   PHART 7.367 09/25/2012 0541   HCO3 24.8* 09/25/2012 0541   TCO2 26 09/25/2012 0541   ACIDBASEDEF 1.0 09/25/2012 0541   O2SAT 64.9 09/25/2012 1530   CBG (last 3)   Basename 09/27/12 0728 09/27/12 0407 09/27/12 0006  GLUCAP 96 91 105*   *RADIOLOGY REPORT*  Clinical Data: Status post redo mitral valve replacement and  tricuspid valve annuloplasty.  PORTABLE CHEST - 1 VIEW  Comparison:  09/26/2012 and  Findings: Decrease in interstitial edema since the prior chest x-  ray and improved aeration noted of the left lower lobe. There  remains a right pleural effusion of relatively stable volume with  associated right lower lung atelectasis. No pneumothorax. Stable  positioning of central line. Stable heart size and mediastinal  contours.  IMPRESSION:  Improved aeration of the left lower lobe and diminished  interstitial edema. Stable right pleural effusion and associated  right lower lung atelectasis.  Original Report Authenticated By: Irish Lack, M.D.  Assessment/Plan: S/P Procedure(s) (LRB): REDO MITRAL VALVE REPLACEMENT (MVR) (N/A) TRICUSPID VALVE REPAIR (N/A) Juntional rhythm 68: continue to monior. Backup VVI pacer at 60 Continue coumadin Chronic Kidney disease: creat almost back to baseline at 3.1. Diuresing spontaneously. Mobilize d/c tubes/lines Plan for transfer to step-down: see transfer orders   LOS: 4 days    Javaughn Opdahl K 09/27/2012

## 2012-09-27 NOTE — Progress Notes (Signed)
Hypoglycemic Event  CBG: 69  Treatment: 15 GM carbohydrate snack  Symptoms: None  Follow-up CBG: Time:0008 CBG Result:105  Possible Reasons for Event: Inadequate meal intake  Comments/MD notified:hypoclycemia orders placed    Ryan Winters, [name]  Remember to initiate Hypoglycemia Order Set & complete

## 2012-09-28 LAB — CBC
HCT: 25 % — ABNORMAL LOW (ref 39.0–52.0)
MCHC: 33.6 g/dL (ref 30.0–36.0)
MCV: 86.8 fL (ref 78.0–100.0)
RDW: 16.7 % — ABNORMAL HIGH (ref 11.5–15.5)

## 2012-09-28 LAB — BASIC METABOLIC PANEL
BUN: 58 mg/dL — ABNORMAL HIGH (ref 6–23)
Creatinine, Ser: 3.01 mg/dL — ABNORMAL HIGH (ref 0.50–1.35)
GFR calc Af Amer: 26 mL/min — ABNORMAL LOW (ref >90)
GFR calc non Af Amer: 23 mL/min — ABNORMAL LOW (ref >90)

## 2012-09-28 MED ORDER — WARFARIN SODIUM 2.5 MG PO TABS: 2.5000 mg | ORAL_TABLET | Freq: Every day | ORAL | Status: DC

## 2012-09-28 MED ORDER — AMIODARONE HCL 200 MG PO TABS
200.0000 mg | ORAL_TABLET | Freq: Every day | ORAL | Status: DC
  Administered 2012-09-29: 200 mg via ORAL
  Filled 2012-09-28 (×3): qty 1

## 2012-09-28 MED ORDER — SORBITOL 70 % PO SOLN
30.0000 mL | Freq: Once | ORAL | Status: AC
  Administered 2012-09-28: 30 mL via ORAL
  Filled 2012-09-28: qty 30

## 2012-09-28 MED ORDER — LACTULOSE 10 GM/15ML PO SOLN
30.0000 g | Freq: Every day | ORAL | Status: DC
  Filled 2012-09-28 (×2): qty 45

## 2012-09-28 MED ORDER — WARFARIN SODIUM 5 MG PO TABS
5.0000 mg | ORAL_TABLET | Freq: Once | ORAL | Status: AC
  Administered 2012-09-28: 5 mg via ORAL
  Filled 2012-09-28: qty 1

## 2012-09-28 NOTE — Progress Notes (Signed)
Pt ambulating hall with relative.

## 2012-09-28 NOTE — Progress Notes (Signed)
Pt ambulating independently, whole unit x 2.

## 2012-09-28 NOTE — Progress Notes (Addendum)
                   [address]            [place] [address]          [phone]      5 Days Post-Op Procedure(s) (LRB): REDO MITRAL VALVE REPLACEMENT (MVR) (N/A) TRICUSPID VALVE REPAIR (N/A)  Subjective: Patient has not had bowel movement since surgery.  Objective: Vital signs in last 24 hours: Temp:  [97.7 F (36.5 C)-99 F (37.2 C)] 99 F (37.2 C) (11/10 0436) Pulse Rate:  [59-67] 63  (11/10 0436) Cardiac Rhythm:  [-] Junctional rhythm (11/10 0810) Resp:  [15-25] 19  (11/10 0436) BP: (118-136)/(61-74) 126/68 mmHg (11/10 0436) SpO2:  [90 %-100 %] 90 % (11/10 0436) Weight:  [146 lb 6.4 oz (66.407 kg)] 146 lb 6.4 oz (66.407 kg) (11/10 0436)  Pre op weight 68 kg Current Weight  09/28/12 146 lb 6.4 oz (66.407 kg)      Intake/Output from previous day: 11/09 0701 - 11/10 0700 In: 104 [I.V.:100; IV Piggyback:4] Out: 2700 [Urine:2400; Chest Tube:300]   Physical Exam:  Cardiovascular: Junctional on tele, systolic murmur Pulmonary: Clear to auscultation bilaterally; no rales, wheezes, or rhonchi. Abdomen: Soft, non tender, bowel sounds present. Extremities: Trace bilateral lower extremity edema. Wound: Clean and dry.  No erythema or signs of infection.  Lab Results: CBC: Basename 09/28/12 0420 09/27/12 0400  WBC 8.8 10.1  HGB 8.4* 8.2*  HCT 25.0* 24.3*  PLT 101* 63*   BMET:  Basename 09/28/12 0420 09/27/12 0400  NA 133* 135  K 4.1 3.6  CL 94* 97  CO2 25 26  GLUCOSE 83 89  BUN 58* 59*  CREATININE 3.01* 3.10*  CALCIUM 8.8 8.5    PT/INR:  Lab Results  Component Value Date   INR 1.08 09/28/2012   INR 1.16 09/27/2012   INR 1.50* 09/26/2012   ABG:  INR: Will add last result for INR, ABG once components are confirmed Will add last 4 CBG results once components are confirmed  Assessment/Plan:  1. CV - Junctional. On back up VVI at 60. On Amiodarone 200 bid, Lopressor 12.5 bid, and Coumadin. Will give Coumadin 5 tonight. HR in the low  60's. Will decrease Amiodarone to 200 daily. Lopressor to be given if meets parameters. 2.  Pulmonary - Encourage incentive spirometer 3. Volume Overload - On Lasix 80 daily and is below pre op weight 4.  Acute blood loss anemia - H and H 8.4 and 25 this am.   . Continue Ferrous gluconate bid. 5.Thrombocytopenia-platelets up to 101,000. 6.CKD-his baseline Cr is around 3.1. His Cr this am is down to 3. 7.LOC constipation  ZIMMERMAN,DONIELLE MPA-C 09/28/2012,9:14 AM    Chart reviewed, patient examined, agree with above. Will stop diuretic since below preop wt at baseline creat 3.0 with ongoing diuresis.

## 2012-09-29 ENCOUNTER — Inpatient Hospital Stay (HOSPITAL_COMMUNITY): Payer: Medicaid Other

## 2012-09-29 LAB — BASIC METABOLIC PANEL
BUN: 51 mg/dL — ABNORMAL HIGH (ref 6–23)
CO2: 29 mEq/L (ref 19–32)
Calcium: 9 mg/dL (ref 8.4–10.5)
Chloride: 92 mEq/L — ABNORMAL LOW (ref 96–112)
Creatinine, Ser: 2.98 mg/dL — ABNORMAL HIGH (ref 0.50–1.35)
GFR calc Af Amer: 26 mL/min — ABNORMAL LOW (ref >90)
GFR calc non Af Amer: 23 mL/min — ABNORMAL LOW (ref >90)
Glucose, Bld: 133 mg/dL — ABNORMAL HIGH (ref 70–99)
Potassium: 3.5 mEq/L (ref 3.5–5.1)
Sodium: 131 mEq/L — ABNORMAL LOW (ref 135–145)

## 2012-09-29 LAB — CBC
HCT: 25.2 % — ABNORMAL LOW (ref 39.0–52.0)
Hemoglobin: 8.5 g/dL — ABNORMAL LOW (ref 13.0–17.0)
MCH: 29.6 pg (ref 26.0–34.0)
MCHC: 33.7 g/dL (ref 30.0–36.0)
MCV: 87.8 fL (ref 78.0–100.0)
Platelets: 98 10*3/uL — ABNORMAL LOW (ref 150–400)
RBC: 2.87 MIL/uL — ABNORMAL LOW (ref 4.22–5.81)
RDW: 16.8 % — ABNORMAL HIGH (ref 11.5–15.5)
WBC: 9.9 10*3/uL (ref 4.0–10.5)

## 2012-09-29 MED ORDER — WARFARIN SODIUM 5 MG PO TABS
5.0000 mg | ORAL_TABLET | Freq: Once | ORAL | Status: AC
  Administered 2012-09-29: 5 mg via ORAL
  Filled 2012-09-29: qty 1

## 2012-09-29 MED ORDER — CARVEDILOL 6.25 MG PO TABS
6.2500 mg | ORAL_TABLET | Freq: Two times a day (BID) | ORAL | Status: DC
  Administered 2012-09-29 – 2012-10-01 (×4): 6.25 mg via ORAL
  Filled 2012-09-29 (×7): qty 1

## 2012-09-29 MED ORDER — LACTULOSE 10 GM/15ML PO SOLN
20.0000 g | Freq: Once | ORAL | Status: DC
  Filled 2012-09-29: qty 30

## 2012-09-29 NOTE — Progress Notes (Signed)
UR Completed.  Adit Riddles Jane [phone] 09/29/2012  

## 2012-09-29 NOTE — Progress Notes (Signed)
Discontinued pt from external pacer. Rolled and taped wires to pt's abdomen. Ryan Winters D 09/29/2012

## 2012-09-29 NOTE — Progress Notes (Signed)
CARDIAC REHAB PHASE I   PRE:  Rate/Rhythm: 69 JR    BP: sitting 126/70    SaO2: 91-92  RA  MODE:  Ambulation: 800 ft   POST:  Rate/Rhythm: 77 JR with PJC    BP: sitting 130/70     SaO2: 87-89  RA  Very mobile independently. Doesn't like to be still/sitting. Does have SOB and SaO2 low after walk, 87-89 Ra. Encouraged pt to use O2 next walk and we will re-eval tomorrow. Pt using IS frequently but only able to do 750cc.  [phone]  Harriet Masson CES, ACSM

## 2012-09-29 NOTE — Progress Notes (Addendum)
[address]            [place] [address]          [phone]     6 Days Post-Op  Procedure(s) (LRB): REDO MITRAL VALVE REPLACEMENT (MVR) (N/A) TRICUSPID VALVE REPAIR (N/A) Subjective: Walking halls this am without difficulty  Objective  Telemetry JR, PVC's   Temp:  [98.7 F (37.1 C)-98.8 F (37.1 C)] 98.7 F (37.1 C) (11/11 0412) Pulse Rate:  [65-69] 69  (11/11 0412) Resp:  [18-19] 19  (11/11 0412) BP: (120-141)/(65-82) 141/82 mmHg (11/11 0412) SpO2:  [91 %-97 %] 94 % (11/11 0751) Weight:  [145 lb 12.8 oz (66.134 kg)] 145 lb 12.8 oz (66.134 kg) (11/11 0412)   Intake/Output Summary (Last 24 hours) at 09/29/12 0803 Last data filed at 09/29/12 0500  Gross per 24 hour  Intake    360 ml  Output    800 ml  Net   -440 ml       General appearance: alert, cooperative and no distress Heart: regular rate and rhythm and 2/6 systolic Murmur, + S4 Lungs: fair air exchange Abdomen: + BS nontender Extremities: + LE edema Wound: incis healing well  Lab Results:  Basename 09/29/12 0510 09/28/12 0420  NA 131* 133*  K 3.5 4.1  CL 92* 94*  CO2 29 25  GLUCOSE 133* 83  BUN 51* 58*  CREATININE 2.98* 3.01*  CALCIUM 9.0 8.8  MG -- --  PHOS -- --    Basename 09/27/12 0400  AST 116*  ALT 62*  ALKPHOS 108  BILITOT 1.3*  PROT 5.8*  ALBUMIN 2.4*   No results found for this basename: LIPASE:2,AMYLASE:2 in the last 72 hours  Basename 09/29/12 0510 09/28/12 0420  WBC 9.9 8.8  NEUTROABS -- --  HGB 8.5* 8.4*  HCT 25.2* 25.0*  MCV 87.8 86.8  PLT 98* 101*   No results found for this basename: CKTOTAL:4,CKMB:4,TROPONINI:4 in the last 72 hours No components found with this basename: POCBNP:3 No results found for this basename: DDIMER in the last 72 hours No results found for this basename: HGBA1C in the last 72 hours No results found for this basename: CHOL,HDL,LDLCALC,TRIG,CHOLHDL in the last 72 hours No results found for this  basename: TSH,T4TOTAL,FREET3,T3FREE,THYROIDAB in the last 72 hours No results found for this basename: VITAMINB12,FOLATE,FERRITIN,TIBC,IRON,RETICCTPCT in the last 72 hours  Medications: Scheduled    . amiodarone  200 mg Oral Daily  . budesonide-formoterol  2 puff Inhalation BID  . docusate sodium  200 mg Oral Daily  . feeding supplement  1 Container Oral TID BM  . ferrous gluconate  324 mg Oral BID WC  . lactulose  30 g Oral Daily  . metoprolol tartrate  12.5 mg Oral BID  . pantoprazole  40 mg Oral Daily  . sildenafil  20 mg Oral BID AC  . sodium chloride  3 mL Intravenous Q12H  . [COMPLETED] sorbitol  30 mL Oral Once  . [COMPLETED] warfarin  5 mg Oral ONCE-1800  . Warfarin - Physician Dosing Inpatient   Does not apply q1800  . [DISCONTINUED] amiodarone  200 mg Oral BID  . [DISCONTINUED] furosemide  80 mg Oral Daily  . [DISCONTINUED] warfarin  2.5 mg Oral q1800  . [DISCONTINUED] warfarin  2.5 mg Oral q1800     Radiology/Studies:  No results found.  INR:1.16 Will add last result for INR, ABG once  components are confirmed Will add last 4 CBG results once components are confirmed  Assessment/Plan: S/P Procedure(s) (LRB): REDO MITRAL VALVE REPLACEMENT (MVR) (N/A) TRICUSPID VALVE REPAIR (N/A)  1. Rhythm status unchanged on current rx, VVI backup.  2 appears to have good UO , more edematous than he has been,weight conts to decrease,  creat improved, cont to monitor 3 cont AC rx 4 push pulm /rehab as able 5 check BNP  LOS: 6 days    GOLD,WAYNE E 11/11/20138:03 AM    patient examined and medical record reviewed,agree with above note.  Change lopressor to coreg due to low HR, LV dysfunction Follow R pl effusion w/ CXR VAN TRIGT III,PETER 09/29/2012

## 2012-09-30 ENCOUNTER — Inpatient Hospital Stay (HOSPITAL_COMMUNITY): Payer: Medicaid Other

## 2012-09-30 DIAGNOSIS — Z9889 Other specified postprocedural states: Secondary | ICD-10-CM

## 2012-09-30 DIAGNOSIS — Z952 Presence of prosthetic heart valve: Secondary | ICD-10-CM

## 2012-09-30 LAB — CBC
HCT: 24 % — ABNORMAL LOW (ref 39.0–52.0)
Hemoglobin: 8.2 g/dL — ABNORMAL LOW (ref 13.0–17.0)
MCH: 30 pg (ref 26.0–34.0)
MCHC: 34.2 g/dL (ref 30.0–36.0)
MCV: 87.9 fL (ref 78.0–100.0)
Platelets: 127 10*3/uL — ABNORMAL LOW (ref 150–400)
RBC: 2.73 MIL/uL — ABNORMAL LOW (ref 4.22–5.81)
RDW: 16.1 % — ABNORMAL HIGH (ref 11.5–15.5)
WBC: 9.7 10*3/uL (ref 4.0–10.5)

## 2012-09-30 LAB — BASIC METABOLIC PANEL
BUN: 51 mg/dL — ABNORMAL HIGH (ref 6–23)
CO2: 26 mEq/L (ref 19–32)
Calcium: 8.8 mg/dL (ref 8.4–10.5)
Chloride: 91 mEq/L — ABNORMAL LOW (ref 96–112)
Creatinine, Ser: 2.59 mg/dL — ABNORMAL HIGH (ref 0.50–1.35)
GFR calc Af Amer: 31 mL/min — ABNORMAL LOW (ref >90)
GFR calc non Af Amer: 27 mL/min — ABNORMAL LOW (ref >90)
Glucose, Bld: 102 mg/dL — ABNORMAL HIGH (ref 70–99)
Potassium: 3.6 mEq/L (ref 3.5–5.1)
Sodium: 129 mEq/L — ABNORMAL LOW (ref 135–145)

## 2012-09-30 LAB — PROTIME-INR: INR: 1.34 (ref 0.00–1.49)

## 2012-09-30 MED ORDER — CARVEDILOL 6.25 MG PO TABS: 6.2500 mg | ORAL_TABLET | Freq: Two times a day (BID) | ORAL | Status: DC

## 2012-09-30 MED ORDER — SILDENAFIL CITRATE 20 MG PO TABS: 20.0000 mg | ORAL_TABLET | Freq: Two times a day (BID) | ORAL | Status: DC

## 2012-09-30 MED ORDER — WARFARIN SODIUM 5 MG PO TABS: 5.0000 mg | ORAL_TABLET | Freq: Every day | ORAL | Status: DC

## 2012-09-30 MED ORDER — LACTULOSE 10 GM/15ML PO SOLN
30.0000 g | Freq: Every day | ORAL | Status: DC
  Filled 2012-09-30 (×2): qty 45

## 2012-09-30 MED ORDER — BUDESONIDE-FORMOTEROL FUMARATE 160-4.5 MCG/ACT IN AERO: 2.0000 | INHALATION_SPRAY | Freq: Two times a day (BID) | RESPIRATORY_TRACT | Status: DC

## 2012-09-30 MED ORDER — WARFARIN SODIUM 5 MG PO TABS
5.0000 mg | ORAL_TABLET | Freq: Every day | ORAL | Status: AC
  Administered 2012-09-30: 5 mg via ORAL
  Filled 2012-09-30: qty 1

## 2012-09-30 NOTE — Progress Notes (Addendum)
CARDIAC REHAB PHASE I   PRE:  Rate/Rhythm: 56 JR    BP: sitting 106/60    SaO2: 99 2L, 97 RA  MODE:  Ambulation: 550 ft   POST:  Rate/Rhythm: 62    BP: sitting 90/54     SaO2: 93 RA  Tolerated well. SaO2 good, not needing O2. Independent. BP lower after walk, not dizzy. [phone]  Harriet Masson CES, ACSM

## 2012-09-30 NOTE — Progress Notes (Addendum)
7 Days Post-Op Procedure(s) (LRB): REDO MITRAL VALVE REPLACEMENT (MVR) (N/A) TRICUSPID VALVE REPAIR (N/A) Subjective:  Ryan Winters has no complaints this morning.  He is ambulating +BM  Objective: Vital signs in last 24 hours: Temp:  [98.3 F (36.8 C)-98.7 F (37.1 C)] 98.3 F (36.8 C) (11/12 0550) Pulse Rate:  [57-71] 60  (11/12 0550) Cardiac Rhythm:  [-] Junctional rhythm (11/11 2021) Resp:  [18] 18  (11/12 0550) BP: (108-144)/(63-76) 144/76 mmHg (11/12 0550) SpO2:  [93 %-100 %] 94 % (11/12 0550) Weight:  [152 lb 11.2 oz (69.264 kg)] 152 lb 11.2 oz (69.264 kg) (11/12 0550)  Intake/Output from previous day: 11/11 0701 - 11/12 0700 In: 483 [P.O.:480; I.V.:3] Out: 1050 [Urine:1050] Intake/Output this shift: Total I/O In: 43 [P.O.:40; I.V.:3] Out: -   General appearance: alert, cooperative and no distress Heart: regular rate and rhythm Lungs: clear to auscultation bilaterally Abdomen: soft, non-tender; bowel sounds normal; no masses,  no organomegaly Extremities: edema 1-2+ Wound: clean and dry  Lab Results:  [facility] 09/30/12 0540 09/29/12 0510  WBC 9.7 9.9  HGB 8.2* 8.5*  HCT 24.0* 25.2*  PLT 127* 98*   BMET:  Basename 09/30/12 0540 09/29/12 0510  NA 129* 131*  K 3.6 3.5  CL 91* 92*  CO2 26 29  GLUCOSE 102* 133*  BUN 51* 51*  CREATININE 2.59* 2.98*  CALCIUM 8.8 9.0    PT/INR:  Basename 09/30/12 0540  LABPROT 16.3*  INR 1.34   ABG    Component Value Date/Time   PHART 7.367 09/25/2012 0541   HCO3 24.8* 09/25/2012 0541   TCO2 26 09/25/2012 0541   ACIDBASEDEF 1.0 09/25/2012 0541   O2SAT 64.9 09/25/2012 1530   CBG (last 3)   Basename 09/27/12 1150  GLUCAP 111*    Assessment/Plan: S/P Procedure(s) (LRB): REDO MITRAL VALVE REPLACEMENT (MVR) (N/A) TRICUSPID VALVE REPAIR (N/A)  1. CV- NSR, pressure elevated, bradycardic- will restart home Norvasc 10mg  daily 2. Pulm- off oxygen, PAH on Slidenafil, stable right sided pleural effusion 3. Renal-  creatinine trending down, continue to hold ACEI 4. INR 1.34 will continue Coumadin 5. Dispo- will d/c EPW, if no issues arise plan for d/c home in next 24-48 hours  LOS: 7 days    BARRETT, ERIN 09/30/2012   patient examined and medical record reviewed,agree with above note. VAN TRIGT III,PETER 10/01/2012

## 2012-09-30 NOTE — Discharge Summary (Signed)
Physician Discharge Summary  Patient ID: Ryan Winters MRN: [id number] DOB/AGE: 1961-07-25 51 y.o.  Admit date: 09/23/2012 Discharge date: 09/30/2012  Admission Diagnoses:  Patient Active Problem List  Diagnosis  . Anemia in chronic kidney disease  . SCHIZOPHRENIA  . BIPOLAR AFFECTIVE DISORDER  . HTN (hypertension), malignant  . Mitral valve disorders  . Acute on chronic diastolic heart failure  . HEMATURIA UNSPECIFIED  . CARDIAC MURMUR  . COCAINE ABUSE, HX OF  . Rectal bleeding  . Abdominal pain  . CKD (chronic kidney disease), stage IV  . Mitral valvular regurgitation  . Ascites  . Constipation  . Chest pain  . GERD (gastroesophageal reflux disease)  . Substance abuse  . Atypical chest pain  . Gastroparesis  . T wave inversion in EKG  . Schizophrenia  . N&V (nausea and vomiting)  . Acute on chronic diastolic CHF (congestive heart failure), NYHA class 3   Discharge Diagnoses:   Patient Active Problem List  Diagnosis  . Anemia in chronic kidney disease  . SCHIZOPHRENIA  . BIPOLAR AFFECTIVE DISORDER  . HTN (hypertension), malignant  . Mitral valve disorders  . Acute on chronic diastolic heart failure  . HEMATURIA UNSPECIFIED  . CARDIAC MURMUR  . COCAINE ABUSE, HX OF  . Rectal bleeding  . Abdominal pain  . CKD (chronic kidney disease), stage IV  . Mitral valvular regurgitation  . Ascites  . Constipation  . Chest pain  . GERD (gastroesophageal reflux disease)  . Substance abuse  . Atypical chest pain  . Gastroparesis  . T wave inversion in EKG  . Schizophrenia  . N&V (nausea and vomiting)  . Acute on chronic diastolic CHF (congestive heart failure), NYHA class 3  . S/P mitral valve replacement  . S/P tricuspid valve repair   Discharged Condition: good  History of Present Illness:   Mr. [name] is a 51 yo African American male with history of poly substance abuse (cocaine) with known history of Mitral Valve Repair.  He also has a history of Mitral  Valve Prolapse, Severe Hypertension, Bipolar and Schizoaffective disorder with previous suicide attempts.  He was referred to Dr. 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  He was evaluated on 09/16/2012 at which time he was complains of dyspnea on exertion and orthopnea.  He had previously undergone cardiac catheterization which did not show significant coronary disease put showed elevated PA pressures.  Transesophageal Echo confirmed the presence of severe mitral and tricuspid regurgitation.  It was felt the patient would benefit from Mitral Valve replacement.  The risks and benefits were explained to the patient and he was agreeable to proceed with surgery scheduled for September 23, 2012.  Hospital Course:   The patient presented to [facility] on 09/23/2012.  Urine drug screen obtained on arrival was negative for cocaine and the patient was taken to the operating room and underwent Mitral Valve Replacement utilizing a 27mm Magna Ease bioprosthetic valve and a Tricuspid Valve Repair with a 28 MC3 Ring Annuloplasty.  The patient tolerated the procedure well and was taken to the ICU in stable condition.  POD #1 the patient was extubated.  He remained stable on Dopamine, Milrinone, and Neo.  POD #2 patient weaned of vasodilators.  He was weaned off inotrope's as tolerated. He was placed on Revatio for PAH.  POD #3  He was started on Coumadin.  He developed atrial fibrillation treated with Amiodarone and Lopressor.  POD #4  His chest tube and arterial lines were removed without difficulty.  Patient with junctional rhythm.  He was medically stable and transferred to the step down unit.  POD #5 patient with bradycardia.  His Amiodarone dose was decreased.  POD #6 patient was transitioned to Coreg.  POD #7 patient in NSR.  His pacing wires were removed without difficulty.  He is doing well and is medically stable at this time.    Should no issues arise he will be discharged home in the next 24-48 hours.  He will need to have a PT/INR drawn on Friday by Dr. Randolm Idol office.  He will follow up with Dr. Donata Clay on 10/22/2012 at 1:00pm with a chest xray prior to his appointment.  He will also need to schedule a 2 week follow up with Dr. Randolm Idol office.   Significant Diagnostic Studies: Echocardiogram  - Left ventricle: The cavity size was normal. There was mild concentric hypertrophy. Systolic function was normal. The estimated ejection fraction was in the range of 50% to 55%. - Mitral valve: Severe regurgitation directed centrally. Mild-moderate perivalvular regurgitation. - Left atrium: The atrium was dilated. No evidence of thrombus in the atrial cavity or appendage. - Right ventricle: The cavity size was dilated. There was mild hypertrophy. - Right atrium: The atrium was dilated. No evidence of thrombus in the atrial cavity or appendage. - Tricuspid valve: Dilated annulus. Normal thickness leaflets. Severe regurgitation originating from the central coaptation point. Impressions:  - Post valve replacement surgery, the mitral prosthetic valve appears to be functioning normally. Tricuspid ring well seated with trace tricuspid regurge. RV function improved during post bypass period. Tricuspid annular ring in good position.  Treatments: surgery:   1. Redo mitral valve replacement with a 27-mm Edwards tissue valve,  serial [id number].  2. Tricuspid annuloplasty with a MC3 annuloplasty ring, serial  #[id number].  3. Placement of right femoral A-line for blood pressure monitoring   Disposition: Home  The patient has been discharged on:   1.Beta Blocker:  Yes [x   ]                              No   [   ]                              If No, reason:  2.Ace Inhibitor/ARB: Yes [   ]                                     No  [ x   ]                                     If No, reason: Elevated  Creatinine  3.Statin:   Yes [   ]                  No  [  x ]                  If No, reason: No Coronary Disease  4.Marlowe Kays:  Yes  [ x  ]                  No   [   ]  If No, reason:     Discharge Orders    Future Appointments: Provider: Department: Dept Phone: Center:   10/22/2012 1:00 PM [name], MD [facility] [phone] [facility]     Future Orders Please Complete By Expires   Amb Referral to Cardiac Rehabilitation          Medication List     As of 09/30/2012 10:04 AM    STOP taking these medications         furosemide 80 MG tablet   Commonly known as: LASIX      hydrALAZINE 100 MG tablet   Commonly known as: APRESOLINE      lisinopril 20 MG tablet   Commonly known as: PRINIVIL,ZESTRIL      metoprolol 50 MG tablet   Commonly known as: LOPRESSOR      TAKE these medications         ABILIFY 5 MG tablet   Generic drug: ARIPiprazole   Take 5 mg by mouth daily.      albuterol 108 (90 BASE) MCG/ACT inhaler   Commonly known as: PROVENTIL HFA;VENTOLIN HFA   Inhale 2 puffs into the lungs every 4 (four) hours as needed. For shortness of breath or wheezing      amoxicillin 500 MG capsule   Commonly known as: AMOXIL   Take four capsules one hour before dental appointment.      aspirin 81 MG EC tablet   Take 81 mg by mouth daily.      budesonide-formoterol 160-4.5 MCG/ACT inhaler   Commonly known as: SYMBICORT   Inhale 2 puffs into the lungs 2 (two) times daily.      carvedilol 6.25 MG tablet   Commonly known as: COREG   Take 1 tablet (6.25 mg total) by mouth 2 (two) times daily with a meal.      docusate sodium 100 MG capsule   Commonly known as: COLACE   Take 100 mg by mouth 2 (two) times daily as needed. For constipation      hydrOXYzine 50 MG capsule   Commonly known as: VISTARIL   Take 50 mg by mouth at bedtime as needed. For sleep      metoCLOPramide 10 MG tablet   Commonly known as:  REGLAN   Take 10 mg by mouth 4 (four) times daily -  before meals and at bedtime.      omeprazole 40 MG capsule   Commonly known as: PRILOSEC   Take 40 mg by mouth 2 (two) times daily as needed. For heart burn      ondansetron 8 MG disintegrating tablet   Commonly known as: ZOFRAN-ODT   Take 1 tablet (8 mg total) by mouth every 8 (eight) hours as needed for nausea.      oxyCODONE-acetaminophen 5-325 MG per tablet   Commonly known as: PERCOCET/ROXICET   Take 1 tablet by mouth every 4 (four) hours as needed for pain.      sildenafil 20 MG tablet   Commonly known as: REVATIO   Take 1 tablet (20 mg total) by mouth 2 (two) times daily before a meal.      warfarin 5 MG tablet   Commonly known as: COUMADIN   Take 1 tablet (5 mg total) by mouth daily.           Follow-up Information    Follow up with VAN Dinah Beers, MD. On 10/22/2012. (Appoiintment is at 1:00)    Contact information:   [address] [place] [address] [phone]       Follow up with [facility]. On 10/22/2012. (Please get chest xray 1 hour prior to appointment with Dr. Donata Clay)    Contact information:   [address] [place] [address]       Follow up with Tonny Bollman, MD. On 10/03/2012. (Please contact office for PT/INR Check on Friday)    Contact information:   [address] [place] [address] [phone]       Schedule an appointment as soon as possible for a visit with Tonny Bollman, MD. (Contact office to 2 week follow up appointment)    Contact information:   [address] [place] [address] [phone]          Signed: Lowella Dandy 09/30/2012, 10:04 AM

## 2012-10-01 ENCOUNTER — Telehealth: Payer: Self-pay | Admitting: *Deleted

## 2012-10-01 DIAGNOSIS — I272 Pulmonary hypertension, unspecified: Secondary | ICD-10-CM

## 2012-10-01 LAB — PROTIME-INR: INR: 1.46 (ref 0.00–1.49)

## 2012-10-01 MED ORDER — OXYCODONE-ACETAMINOPHEN 5-325 MG PO TABS: 1.0000 | ORAL_TABLET | ORAL | Status: DC | PRN

## 2012-10-01 MED ORDER — TADALAFIL 20 MG PO TABS
10.0000 mg | ORAL_TABLET | Freq: Two times a day (BID) | ORAL | Status: DC
Start: 2012-10-01 — End: 2012-10-24

## 2012-10-01 NOTE — Progress Notes (Signed)
CTS x 4 DC'd per MD order and protocol.  1/4 inch steris with betadine applied to sites.  Pt tolerated well, no bleeding noted, sites approximated.  Will continue to monitor closely. Ave Filter

## 2012-10-01 NOTE — Telephone Encounter (Signed)
[facility] called saying his sildenafil needed prior authorization through [place].  I called Medicaid and was told that they prefer he try tadalafil(adcrica) 20 mg twice daily and that is approved.  This was called to his pharmacy.

## 2012-10-01 NOTE — Progress Notes (Signed)
Discharge instructions, appointments, prescriptions reviewed with patient, no questions or concerns.  Pt will be discharged with assistance via volunteer services. Ave [facility]

## 2012-10-01 NOTE — Discharge Summary (Signed)
patient examined and medical record reviewed,agree with above note. VAN TRIGT III,Dandrae Kustra 10/01/2012

## 2012-10-06 ENCOUNTER — Ambulatory Visit: Payer: Medicaid Other

## 2012-10-06 ENCOUNTER — Ambulatory Visit
Admission: RE | Admit: 2012-10-06 | Discharge: 2012-10-06 | Disposition: A | Discharge status: Home / Self Care | Payer: Medicaid Other | Source: Ambulatory Visit | Attending: Cardiothoracic Surgery | Admitting: Cardiothoracic Surgery

## 2012-10-06 ENCOUNTER — Ambulatory Visit (INDEPENDENT_AMBULATORY_CARE_PROVIDER_SITE_OTHER): Payer: Medicaid Other | Admitting: Cardiothoracic Surgery

## 2012-10-06 ENCOUNTER — Other Ambulatory Visit: Payer: Self-pay | Admitting: Cardiothoracic Surgery

## 2012-10-06 ENCOUNTER — Encounter: Payer: Self-pay | Admitting: Cardiothoracic Surgery

## 2012-10-06 VITALS — BP 134/81 | HR 88 | Resp 18 | Ht 66.0 in | Wt 151.0 lb

## 2012-10-06 DIAGNOSIS — I059 Rheumatic mitral valve disease, unspecified: Secondary | ICD-10-CM

## 2012-10-06 NOTE — Progress Notes (Signed)
PCP is Dorrene German, MD Referring Provider is Tonny Bollman, MD  Chief Complaint  Patient presents with  . Routine Post Op    Redo MVR on 09/24/12- access swollen feet since d/c from hospital     HPI: The patient returns complaining of swollen ankles after being discharged for redo mitral valve replacement with a tissue valve and tricuspid valve repair. He has noted improvement in his breathing and he has noted he can sleep flat now the incisions are healing well he denies fever he is taking Coumadin but has not had a prothrombin time checked and he denies bleeding   Past Medical History  Diagnosis Date  . Hypertension   . Bipolar disorder   . Schizophrenia     h/o hospitalized inpt therapy @ the [facility]  . 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   Marland Kitchen Hyperlipidemia   . H/O: suicide attempt 1998    was hospitalized at [facility]  . Anemia     Hx of iron infusions  . Chronic diastolic heart failure     Hx of Class IV CHF  . Gastroparesis   . Generalized headaches     sinus  . Arthritis   . Cocaine abuse   . Anxiety   . CHF (congestive heart failure)   . GERD (gastroesophageal reflux disease)   . Heart murmur   . Shortness of breath     with ambulation and sometimes sitting  . Depression   . Vertigo   . Constipation   . Itchy skin   . Chronic kidney disease     has fistula, not on HD    Past Surgical History  Procedure Date  . Mitral valve repair 05/2010  . Av fistula placement, radiocephalic 05/24/2011    Left arm  . Leg surgery   . Umbilical hernia repair 04/01/2012    Procedure: HERNIA REPAIR UMBILICAL ADULT;  Surgeon: Ardeth Sportsman, MD;  Location: [facility] OR;  Service: General;  Laterality: N/A;  supra umbilical  . Tee without cardioversion 08/25/2012    Procedure: TRANSESOPHAGEAL ECHOCARDIOGRAM  (TEE);  Surgeon: Wendall Stade, MD;  Location: [facility] ENDOSCOPY;  Service: Cardiovascular;  Laterality: N/A;  . Mitral valve replacement 09/23/2012    Procedure: REDO MITRAL VALVE REPLACEMENT (MVR);  Surgeon: [name], MD;  Location: [facility] OR;  Service: Open Heart Surgery;  Laterality: N/A;  Nitric Oxide  . Tricuspid valve replacement 09/23/2012    Procedure: TRICUSPID VALVE REPAIR;  Surgeon: [name], MD;  Location: [facility] OR;  Service: Open Heart Surgery;  Laterality: N/A;    Family History  Problem Relation Age of Onset  . Hypertension Mother     Social History History  Substance Use Topics  . Smoking status: Former Smoker -- 1.5 packs/day for 30 years    Types: Cigarettes    Quit date: 03/21/2011  . Smokeless tobacco: Never Used  . Alcohol Use: No     Comment: Quit alcohol in July of 2011    Current Outpatient Prescriptions  Medication Sig Dispense Refill  . albuterol (PROVENTIL HFA;VENTOLIN HFA) 108 (90 BASE) MCG/ACT inhaler Inhale 2 puffs into the lungs every 4 (four) hours as needed. For shortness of breath or wheezing      . amoxicillin (AMOXIL) 500 MG capsule Take four capsules one hour before dental  appointment.  4 capsule  1  . ARIPiprazole (ABILIFY) 5 MG tablet Take 5 mg by mouth daily.        Marland Kitchen aspirin 81 MG EC tablet Take 81 mg by mouth daily.        . budesonide-formoterol (SYMBICORT) 160-4.5 MCG/ACT inhaler Inhale 2 puffs into the lungs 2 (two) times daily.  1 Inhaler  1  . carvedilol (COREG) 6.25 MG tablet Take 1 tablet (6.25 mg total) by mouth 2 (two) times daily with a meal.  60 tablet  1  . docusate sodium (COLACE) 100 MG capsule Take 100 mg by mouth 2 (two) times daily as needed. For constipation      . hydrOXYzine (VISTARIL) 50 MG capsule Take 50 mg by mouth at bedtime as needed. For sleep      . metoCLOPramide (REGLAN) 10 MG tablet Take 10 mg by mouth 4 (four) times daily -  before meals and at bedtime.      Marland Kitchen omeprazole (PRILOSEC) 40 MG capsule Take 40 mg  by mouth 2 (two) times daily as needed. For heart burn      . ondansetron (ZOFRAN ODT) 8 MG disintegrating tablet Take 1 tablet (8 mg total) by mouth every 8 (eight) hours as needed for nausea.  5 tablet  0  . oxyCODONE-acetaminophen (ROXICET) 5-325 MG per tablet Take 1-2 tablets by mouth every 4 (four) hours as needed for pain.  30 tablet  0  . tadalafil (CIALIS) 20 MG tablet Take 0.5 tablets (10 mg total) by mouth 2 (two) times daily before a meal.  30 tablet  1  . warfarin (COUMADIN) 5 MG tablet Take 1 tablet (5 mg total) by mouth daily.  30 tablet  1    Allergies  Allergen Reactions  . Cashew Nut Oil Anaphylaxis  . Food Anaphylaxis    Strawberries, Cashews, causes throat to swell - affects breathing    Review of Systems no fever still having chest wall pain BP 134/81  Pulse 88  Resp 18  Ht 5\' 6"  (1.676 m)  Wt 151 lb (68.493 kg)  BMI 24.37 kg/m2  SpO2 94% Physical Exam Alert and comfortable Incisions clean and dry Heart rate regular, sinus No systolic murmur of MR or TR 2+ ankle edema  Diagnostic Tests: Chest x-ray shows increased in right pleural effusion probable mild to moderate  Impression: Fluid retention after he is discharged home without Lasix after being on Lasix for one to 2 years   Plan: Resume Lasix 80 mg a day for 5 days then 40 mg a day and take 1 potassium tablet daily Prescription for oxycodone provided tablets #45.0 mg We will stop Coumadin as he is been now 4 weeks postop and he is unable to get reliable prothrombin time drawn We will follow up a chest x-ray when he returns in 10 days and consider right thoracentesis if needed

## 2012-10-14 ENCOUNTER — Telehealth: Payer: Self-pay | Admitting: Cardiovascular Disease

## 2012-10-20 ENCOUNTER — Other Ambulatory Visit: Payer: Self-pay | Admitting: *Deleted

## 2012-10-20 ENCOUNTER — Other Ambulatory Visit: Payer: Self-pay | Admitting: Cardiothoracic Surgery

## 2012-10-20 DIAGNOSIS — G8918 Other acute postprocedural pain: Secondary | ICD-10-CM

## 2012-10-20 DIAGNOSIS — Z9889 Other specified postprocedural states: Secondary | ICD-10-CM

## 2012-10-20 MED ORDER — HYDROCODONE-ACETAMINOPHEN 7.5-500 MG PO TABS: 1.0000 | ORAL_TABLET | ORAL | Status: DC | PRN

## 2012-10-20 NOTE — Telephone Encounter (Signed)
Generic Lortab 7.5/325mg  was ordered....unable to enter into med list d/t not being in [facility] formulary.

## 2012-10-22 ENCOUNTER — Encounter: Payer: Self-pay | Admitting: Nurse Practitioner

## 2012-10-22 ENCOUNTER — Encounter: Payer: Self-pay | Admitting: Cardiothoracic Surgery

## 2012-10-22 ENCOUNTER — Ambulatory Visit (INDEPENDENT_AMBULATORY_CARE_PROVIDER_SITE_OTHER): Payer: Self-pay | Admitting: Cardiothoracic Surgery

## 2012-10-22 ENCOUNTER — Ambulatory Visit
Admission: RE | Admit: 2012-10-22 | Discharge: 2012-10-22 | Disposition: A | Discharge status: Home / Self Care | Payer: Medicaid Other | Source: Ambulatory Visit | Attending: Cardiothoracic Surgery | Admitting: Cardiothoracic Surgery

## 2012-10-22 VITALS — BP 127/69 | HR 69 | Resp 16 | Ht 66.0 in | Wt 147.0 lb

## 2012-10-22 DIAGNOSIS — N189 Chronic kidney disease, unspecified: Secondary | ICD-10-CM

## 2012-10-22 DIAGNOSIS — Z954 Presence of other heart-valve replacement: Secondary | ICD-10-CM

## 2012-10-22 DIAGNOSIS — Z952 Presence of prosthetic heart valve: Secondary | ICD-10-CM

## 2012-10-22 DIAGNOSIS — I340 Nonrheumatic mitral (valve) insufficiency: Secondary | ICD-10-CM

## 2012-10-22 DIAGNOSIS — I10 Essential (primary) hypertension: Secondary | ICD-10-CM

## 2012-10-22 DIAGNOSIS — Z9889 Other specified postprocedural states: Secondary | ICD-10-CM

## 2012-10-22 DIAGNOSIS — I059 Rheumatic mitral valve disease, unspecified: Secondary | ICD-10-CM

## 2012-10-22 NOTE — Progress Notes (Signed)
PCP is Dorrene German, MD Referring Provider is Tonny Bollman, MD  Chief Complaint  Patient presents with  . Routine Post Op    3 wk f/u REDO MVR with cxr    HPI: [name] is doing much better since his last office visit when he had evidence of significant fluid retention and edema with shortness of breath orthopnea and pedal edema. He is stop his Lasix. The patient was given a short course of oral Zaroxolyn and 40 mg of Lasix daily. He is taking Lasix now chills much better. His incisions continued healed nicely. It is not smoking or taking alcohol or drugs. He denies any bleeding complications from Coumadin. He still has significant chest wall pain and your lives on oxycodone for pain relief. He was given refill prescriptions today.   Past Medical History  Diagnosis Date  . Hypertension   . Bipolar disorder   . Schizophrenia     h/o hospitalized inpt therapy @ the [facility]  . 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   Marland Kitchen Hyperlipidemia   . H/O: suicide attempt 1998    was hospitalized at [facility]  . Anemia     Hx of iron infusions  . Chronic diastolic heart failure     Hx of Class IV CHF  . Gastroparesis   . Generalized headaches     sinus  . Arthritis   . Cocaine abuse   . Anxiety   . CHF (congestive heart failure)   . GERD (gastroesophageal reflux disease)   . Heart murmur   . Shortness of breath     with ambulation and sometimes sitting  . Depression   . Vertigo   . Constipation   . Itchy skin   . Chronic kidney disease     has fistula, not on HD    Past Surgical History  Procedure Date  . Mitral valve repair 05/2010  . Av fistula placement, radiocephalic 05/24/2011    Left arm  . Leg surgery   . Umbilical hernia repair 04/01/2012    Procedure: HERNIA REPAIR UMBILICAL ADULT;  Surgeon: Ardeth Sportsman, MD;  Location: [facility] OR;  Service: General;  Laterality: N/A;  supra umbilical  . Tee without cardioversion 08/25/2012    Procedure: TRANSESOPHAGEAL ECHOCARDIOGRAM (TEE);  Surgeon: Wendall Stade, MD;  Location: [facility] ENDOSCOPY;  Service: Cardiovascular;  Laterality: N/A;  . Mitral valve replacement 09/23/2012    Procedure: REDO MITRAL VALVE REPLACEMENT (MVR);  Surgeon: [name], MD;  Location: [facility] OR;  Service: Open Heart Surgery;  Laterality: N/A;  Nitric Oxide  . Tricuspid valve replacement 09/23/2012    Procedure: TRICUSPID VALVE REPAIR;  Surgeon: [name], MD;  Location: [facility] OR;  Service: Open Heart Surgery;  Laterality: N/A;    Family History  Problem Relation Age of Onset  . Hypertension Mother     Social History History  Substance Use Topics  . Smoking status: Former Smoker -- 1.5 packs/day for 30 years    Types: Cigarettes    Quit date: 03/21/2011  . Smokeless tobacco: Never Used  . Alcohol Use: No     Comment: Quit alcohol in July of 2011    Current Outpatient Prescriptions  Medication Sig Dispense Refill  . albuterol (PROVENTIL HFA;VENTOLIN HFA) 108 (90 BASE) MCG/ACT inhaler Inhale 2 puffs into  the lungs every 4 (four) hours as needed. For shortness of breath or wheezing      . amoxicillin (AMOXIL) 500 MG capsule Take four capsules one hour before dental appointment.  4 capsule  1  . ARIPiprazole (ABILIFY) 5 MG tablet Take 5 mg by mouth daily.        Marland Kitchen aspirin 81 MG EC tablet Take 81 mg by mouth daily.        . carvedilol (COREG) 6.25 MG tablet Take 1 tablet (6.25 mg total) by mouth 2 (two) times daily with a meal.  60 tablet  1  . docusate sodium (COLACE) 100 MG capsule Take 100 mg by mouth 2 (two) times daily as needed. For constipation      . hydrOXYzine (VISTARIL) 50 MG capsule Take 50 mg by mouth at bedtime as needed. For sleep      . omeprazole (PRILOSEC) 40 MG capsule Take 40 mg by mouth 2 (two) times daily as needed. For heart burn      .  oxyCODONE-acetaminophen (ROXICET) 5-325 MG per tablet Take 1-2 tablets by mouth every 4 (four) hours as needed for pain.  30 tablet  0  . tadalafil (CIALIS) 20 MG tablet Take 0.5 tablets (10 mg total) by mouth 2 (two) times daily before a meal.  30 tablet  1  . warfarin (COUMADIN) 5 MG tablet Take 1 tablet (5 mg total) by mouth daily.  30 tablet  1  . budesonide-formoterol (SYMBICORT) 160-4.5 MCG/ACT inhaler Inhale 2 puffs into the lungs 2 (two) times daily.  1 Inhaler  1  . HYDROcodone-acetaminophen (LORTAB 7.5) 7.5-500 MG per tablet Take 1 tablet by mouth every 4 (four) hours as needed for pain (may take one or two tablets every 4-6 hrs prn).  40 tablet  0  . ondansetron (ZOFRAN ODT) 8 MG disintegrating tablet Take 1 tablet (8 mg total) by mouth every 8 (eight) hours as needed for nausea.  5 tablet  0    Allergies  Allergen Reactions  . Cashew Nut Oil Anaphylaxis  . Food Anaphylaxis    Strawberries, Cashews, causes throat to swell - affects breathing    Review of Systems incisions healing well, no edema,  BP 127/69  Pulse 69  Resp 16  Ht 5\' 6"  (1.676 m)  Wt 147 lb (66.679 kg)  BMI 23.73 kg/m2  SpO2 97% Physical Exam Alert and comfortable Lungs clear No murmur of MR or TR, regular heart rate No peripheral edema Sternum well-healed  Diagnostic Tests: Chest x-ray shows no interstitial edema, elevation of right hemidiaphragm from redo MVR, sternal wires intact  Impression: Good functional improvement following mitral valve replacement for severe MR and MS-rheumatic mitral valvular degeneration  Plan: Continue current medications followup in one month. Patient encouraged to walk 20 minutes daily.

## 2012-10-24 ENCOUNTER — Encounter: Payer: Self-pay | Admitting: Cardiovascular Disease

## 2012-10-24 ENCOUNTER — Ambulatory Visit (INDEPENDENT_AMBULATORY_CARE_PROVIDER_SITE_OTHER): Payer: Medicaid Other | Admitting: Cardiovascular Disease

## 2012-10-24 VITALS — BP 133/76 | HR 70 | Ht 66.0 in | Wt 155.0 lb

## 2012-10-24 DIAGNOSIS — I340 Nonrheumatic mitral (valve) insufficiency: Secondary | ICD-10-CM

## 2012-10-24 DIAGNOSIS — I5032 Chronic diastolic (congestive) heart failure: Secondary | ICD-10-CM

## 2012-10-24 DIAGNOSIS — F17200 Nicotine dependence, unspecified, uncomplicated: Secondary | ICD-10-CM

## 2012-10-24 DIAGNOSIS — Z720 Tobacco use: Secondary | ICD-10-CM

## 2012-10-24 DIAGNOSIS — I059 Rheumatic mitral valve disease, unspecified: Secondary | ICD-10-CM

## 2012-10-24 MED ORDER — NICOTINE 14 MG/24HR TD PT24: 1.0000 | MEDICATED_PATCH | TRANSDERMAL | Status: DC

## 2012-10-24 MED ORDER — FUROSEMIDE 40 MG PO TABS: 40.0000 mg | ORAL_TABLET | Freq: Every day | ORAL | Status: DC

## 2012-10-24 NOTE — Progress Notes (Signed)
HPI:  51 year old Ryan Winters presented for followup evaluation. He has a history of valvular heart disease and underwent mitral valve repair a few years ago. He has had continued heart failure with recurrent severe mitral regurgitation. He underwent redo cardiac surgery with bioprosthetic mitral valve replacement and tricuspid annuloplasty on November 5. He presents today for hospital followup evaluation. He also has comorbidities of severe malignant hypertension and chronic kidney disease.  The patient has done well following surgery. He has some residual sternal soreness, but otherwise no complaints. His leg swelling has greatly improved. He is still taking furosemide 40 mg daily. He still has shortness of breath with activity, but is much improved. He is feeling better day by day. He smoking just one or 2 cigarettes a day and would like a nicotine patch because he continues to have strong cravings. He has stayed off of drugs and alcohol.  Outpatient Encounter Prescriptions as of 10/24/2012  Medication Sig Dispense Refill  . albuterol (PROVENTIL HFA;VENTOLIN HFA) 108 (90 BASE) MCG/ACT inhaler Inhale 2 puffs into the lungs every 4 (four) hours as needed. For shortness of breath or wheezing      . amoxicillin (AMOXIL) 500 MG capsule Take four capsules one hour before dental appointment.  4 capsule  1  . ARIPiprazole (ABILIFY) 5 MG tablet Take 5 mg by mouth daily.        Marland Kitchen aspirin 81 MG EC tablet Take 81 mg by mouth daily.        . budesonide-formoterol (SYMBICORT) 160-4.5 MCG/ACT inhaler Inhale 2 puffs into the lungs 2 (two) times daily.  1 Inhaler  1  . carvedilol (COREG) 6.25 MG tablet Take 1 tablet (6.25 mg total) by mouth 2 (two) times daily with a meal.  60 tablet  1  . docusate sodium (COLACE) 100 MG capsule Take 100 mg by mouth 2 (two) times daily as needed. For constipation      . HYDROcodone-acetaminophen (LORTAB 7.5) 7.5-500 MG per tablet Take 1 tablet by mouth every 4 (four) hours as needed for  pain (may take one or two tablets every 4-6 hrs prn).  40 tablet  0  . hydrOXYzine (VISTARIL) 50 MG capsule Take 50 mg by mouth at bedtime as needed. For sleep      . metoCLOPramide (REGLAN) 10 MG tablet Take 10 mg by mouth 4 (four) times daily.      Marland Kitchen omeprazole (PRILOSEC) 40 MG capsule Take 40 mg by mouth 2 (two) times daily as needed. For heart burn      . ondansetron (ZOFRAN ODT) 8 MG disintegrating tablet Take 1 tablet (8 mg total) by mouth every 8 (eight) hours as needed for nausea.  5 tablet  0  . oxyCODONE-acetaminophen (ROXICET) 5-325 MG per tablet Take 1-2 tablets by mouth every 4 (four) hours as needed for pain.  30 tablet  0  . sildenafil (REVATIO) 20 MG tablet Take 20 mg by mouth 2 (two) times daily.      Marland Kitchen warfarin (COUMADIN) 5 MG tablet Take 1 tablet (5 mg total) by mouth daily.  30 tablet  1  . [DISCONTINUED] tadalafil (CIALIS) 20 MG tablet Take 0.5 tablets (10 mg total) by mouth 2 (two) times daily before a meal.  30 tablet  1    Allergies  Allergen Reactions  . Cashew Nut Oil Anaphylaxis  . Food Anaphylaxis    Strawberries, Cashews, causes throat to swell - affects breathing    Past Medical History  Diagnosis Date  .  Hypertension   . Bipolar disorder   . Schizophrenia     h/o hospitalized inpt therapy @ the [facility]  . 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   Marland Kitchen Hyperlipidemia   . H/O: suicide attempt 1998    was hospitalized at [facility]  . Anemia     Hx of iron infusions  . Chronic diastolic heart failure     Hx of Class IV CHF  . Gastroparesis   . Generalized headaches     sinus  . Arthritis   . Cocaine abuse   . Anxiety   . CHF (congestive heart failure)   . GERD (gastroesophageal reflux disease)   . Heart murmur   . Shortness of breath     with ambulation and sometimes sitting  .  Depression   . Vertigo   . Constipation   . Itchy skin   . Chronic kidney disease     has fistula, not on HD    ROS: Negative except as per HPI  BP 133/76  Pulse 70  Ht 5\' 6"  (1.676 m)  Wt 155 lb (70.308 kg)  BMI 25.02 kg/m2  PHYSICAL EXAM: Pt is alert and oriented, NAD HEENT: normal Neck: JVP - normal, carotids 2+= without bruits Lungs: CTA bilaterally CV: RRR with a soft systolic ejection murmur at the left sternal border, no diastolic murmurs are present. Abd: soft, NT, Positive BS, no hepatomegaly Ext: Trace to 1+ bilateral pretibial edema, distal pulses intact and equal Skin: warm/dry no rash  EKG:  Normal sinus rhythm 72 beats per minute, nonspecific T wave abnormality.  ASSESSMENT AND PLAN: 1. Valvular heart disease status post mitral valve replacement and tricuspid valve repair with ring annuloplasty. The patient appears to be doing very well. I'm going to check an echocardiogram before his followup visit in 2 months.  2. Hypertension. Blood pressure is well-controlled today.  3. Chronic diastolic heart failure secondary malignant hypertension and valvular heart disease. His volume status appears to be improving. I think he should remain on furosemide 40 mg daily.  4. Tobacco abuse. A prescription for a nicotine patch was written and tobacco cessation counseling was done.  For followup I would like to see the patient back in 8 weeks with an echo before his office visit. He should begin cardiac rehabilitation.  Tonny Bollman 10/24/2012 12:31 PM

## 2012-10-24 NOTE — Patient Instructions (Addendum)
Your physician recommends that you schedule a follow-up appointment in:  2 months.   Your physician has requested that you have an echocardiogram. Echocardiography is a painless test that uses sound waves to create images of your heart. It provides your doctor with information about the size and shape of your heart and how well your heart's chambers and valves are working. This procedure takes approximately one hour. There are no restrictions for this procedure. To be done in about 2 months---few days prior to appt with Dr. Excell Seltzer.  Continue furosemide 40 mg daily.   Use Nicoderm patch daily as directed.

## 2012-10-30 ENCOUNTER — Encounter (HOSPITAL_COMMUNITY)
Admission: RE | Admit: 2012-10-30 | Discharge: 2012-10-30 | Disposition: A | Discharge status: Home / Self Care | Payer: Medicaid Other | Source: Ambulatory Visit | Attending: Cardiovascular Disease | Admitting: Cardiovascular Disease

## 2012-10-30 DIAGNOSIS — I5032 Chronic diastolic (congestive) heart failure: Secondary | ICD-10-CM | POA: Insufficient documentation

## 2012-10-30 DIAGNOSIS — Z954 Presence of other heart-valve replacement: Secondary | ICD-10-CM | POA: Insufficient documentation

## 2012-10-30 DIAGNOSIS — Z5189 Encounter for other specified aftercare: Secondary | ICD-10-CM | POA: Insufficient documentation

## 2012-10-30 DIAGNOSIS — I059 Rheumatic mitral valve disease, unspecified: Secondary | ICD-10-CM | POA: Insufficient documentation

## 2012-10-30 NOTE — Progress Notes (Signed)
Cardiac Rehab Medication Review by a Pharmacist  Does the patient  feel that his/her medications are working for him/her?  yes  Has the patient been experiencing any side effects to the medications prescribed?  no  Does the patient measure his/her own blood pressure or blood glucose at home?  yes  - blood pressure every evening  Does the patient have any problems obtaining medications due to transportation or finances?   no  Understanding of regimen: fair Understanding of indications: good Potential of compliance: good    Pharmacist comments: He understood the indications for the majority of his medications but not all. These were reviewed. He reported no issues with any of his medicines.  He initially said he took his carvedilol as needed but was not sure. He reports using a pill box.  It was reinforced to take the carvedilol twice a day and the importance of this medicine.      Barbaraann Faster, Philamena Kramar 10/30/2012 9:02 AM

## 2012-11-03 ENCOUNTER — Encounter (HOSPITAL_COMMUNITY)
Admission: RE | Admit: 2012-11-03 | Discharge: 2012-11-03 | Disposition: A | Discharge status: Home / Self Care | Payer: Medicaid Other | Source: Ambulatory Visit | Attending: Cardiovascular Disease | Admitting: Cardiovascular Disease

## 2012-11-04 NOTE — Progress Notes (Addendum)
Pt started cardiac rehab today.  Pt tolerated light exercise without difficulty. Telemetry rhythm Sinus Rhythm without ectopy.  Will continue to monitor the patient throughout  the program.

## 2012-11-05 ENCOUNTER — Encounter (HOSPITAL_COMMUNITY)
Admission: RE | Admit: 2012-11-05 | Discharge: 2012-11-05 | Disposition: A | Discharge status: Home / Self Care | Payer: Medicaid Other | Source: Ambulatory Visit | Attending: Cardiovascular Disease | Admitting: Cardiovascular Disease

## 2012-11-07 ENCOUNTER — Encounter (HOSPITAL_COMMUNITY)
Admission: RE | Admit: 2012-11-07 | Discharge: 2012-11-07 | Disposition: A | Discharge status: Home / Self Care | Payer: Medicaid Other | Source: Ambulatory Visit | Attending: Cardiovascular Disease | Admitting: Cardiovascular Disease

## 2012-11-08 ENCOUNTER — Emergency Department (HOSPITAL_COMMUNITY)
Admission: EM | Admit: 2012-11-08 | Discharge: 2012-11-09 | Disposition: A | Discharge status: Home / Self Care | Payer: Medicaid Other | Attending: Emergency Medicine | Admitting: Emergency Medicine

## 2012-11-08 ENCOUNTER — Encounter (HOSPITAL_COMMUNITY): Payer: Self-pay | Admitting: *Deleted

## 2012-11-08 DIAGNOSIS — I129 Hypertensive chronic kidney disease with stage 1 through stage 4 chronic kidney disease, or unspecified chronic kidney disease: Secondary | ICD-10-CM | POA: Insufficient documentation

## 2012-11-08 DIAGNOSIS — F319 Bipolar disorder, unspecified: Secondary | ICD-10-CM | POA: Insufficient documentation

## 2012-11-08 DIAGNOSIS — Z87891 Personal history of nicotine dependence: Secondary | ICD-10-CM | POA: Insufficient documentation

## 2012-11-08 DIAGNOSIS — Z872 Personal history of diseases of the skin and subcutaneous tissue: Secondary | ICD-10-CM | POA: Insufficient documentation

## 2012-11-08 DIAGNOSIS — Z8679 Personal history of other diseases of the circulatory system: Secondary | ICD-10-CM | POA: Insufficient documentation

## 2012-11-08 DIAGNOSIS — K5900 Constipation, unspecified: Secondary | ICD-10-CM | POA: Insufficient documentation

## 2012-11-08 DIAGNOSIS — F1910 Other psychoactive substance abuse, uncomplicated: Secondary | ICD-10-CM | POA: Insufficient documentation

## 2012-11-08 DIAGNOSIS — Z79899 Other long term (current) drug therapy: Secondary | ICD-10-CM | POA: Insufficient documentation

## 2012-11-08 DIAGNOSIS — R109 Unspecified abdominal pain: Secondary | ICD-10-CM | POA: Insufficient documentation

## 2012-11-08 DIAGNOSIS — F329 Major depressive disorder, single episode, unspecified: Secondary | ICD-10-CM | POA: Insufficient documentation

## 2012-11-08 DIAGNOSIS — R0789 Other chest pain: Secondary | ICD-10-CM

## 2012-11-08 DIAGNOSIS — R0602 Shortness of breath: Secondary | ICD-10-CM | POA: Insufficient documentation

## 2012-11-08 DIAGNOSIS — Z8669 Personal history of other diseases of the nervous system and sense organs: Secondary | ICD-10-CM | POA: Insufficient documentation

## 2012-11-08 DIAGNOSIS — Z954 Presence of other heart-valve replacement: Secondary | ICD-10-CM | POA: Insufficient documentation

## 2012-11-08 DIAGNOSIS — E785 Hyperlipidemia, unspecified: Secondary | ICD-10-CM | POA: Insufficient documentation

## 2012-11-08 DIAGNOSIS — Z8719 Personal history of other diseases of the digestive system: Secondary | ICD-10-CM | POA: Insufficient documentation

## 2012-11-08 DIAGNOSIS — Z862 Personal history of diseases of the blood and blood-forming organs and certain disorders involving the immune mechanism: Secondary | ICD-10-CM | POA: Insufficient documentation

## 2012-11-08 DIAGNOSIS — N189 Chronic kidney disease, unspecified: Secondary | ICD-10-CM | POA: Insufficient documentation

## 2012-11-08 DIAGNOSIS — R011 Cardiac murmur, unspecified: Secondary | ICD-10-CM | POA: Insufficient documentation

## 2012-11-08 DIAGNOSIS — Z8739 Personal history of other diseases of the musculoskeletal system and connective tissue: Secondary | ICD-10-CM | POA: Insufficient documentation

## 2012-11-08 DIAGNOSIS — K219 Gastro-esophageal reflux disease without esophagitis: Secondary | ICD-10-CM | POA: Insufficient documentation

## 2012-11-08 DIAGNOSIS — F209 Schizophrenia, unspecified: Secondary | ICD-10-CM | POA: Insufficient documentation

## 2012-11-08 DIAGNOSIS — Z8659 Personal history of other mental and behavioral disorders: Secondary | ICD-10-CM | POA: Insufficient documentation

## 2012-11-08 DIAGNOSIS — I5032 Chronic diastolic (congestive) heart failure: Secondary | ICD-10-CM | POA: Insufficient documentation

## 2012-11-08 DIAGNOSIS — Z9889 Other specified postprocedural states: Secondary | ICD-10-CM | POA: Insufficient documentation

## 2012-11-08 DIAGNOSIS — Z7982 Long term (current) use of aspirin: Secondary | ICD-10-CM | POA: Insufficient documentation

## 2012-11-08 DIAGNOSIS — F3289 Other specified depressive episodes: Secondary | ICD-10-CM | POA: Insufficient documentation

## 2012-11-08 DIAGNOSIS — Z952 Presence of prosthetic heart valve: Secondary | ICD-10-CM

## 2012-11-08 LAB — CBC WITH DIFFERENTIAL/PLATELET
Basophils Absolute: 0 10*3/uL (ref 0.0–0.1)
Basophils Relative: 1 % (ref 0–1)
Eosinophils Absolute: 0.2 10*3/uL (ref 0.0–0.7)
Eosinophils Relative: 4 % (ref 0–5)
Lymphs Abs: 0.8 10*3/uL (ref 0.7–4.0)
MCH: 24.6 pg — ABNORMAL LOW (ref 26.0–34.0)
MCV: 78.6 fL (ref 78.0–100.0)
Neutrophils Relative %: 72 % (ref 43–77)
Platelets: 196 10*3/uL (ref 150–400)
RBC: 3.37 MIL/uL — ABNORMAL LOW (ref 4.22–5.81)
RDW: 16.9 % — ABNORMAL HIGH (ref 11.5–15.5)

## 2012-11-08 NOTE — ED Notes (Signed)
The pt is c/o chest tightness while visiting his mother in the ed.  He had valve surgery.  Dec 5th

## 2012-11-08 NOTE — ED Notes (Signed)
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  No stics in that arm

## 2012-11-09 ENCOUNTER — Emergency Department (HOSPITAL_COMMUNITY): Payer: Medicaid Other

## 2012-11-09 LAB — URINALYSIS, ROUTINE W REFLEX MICROSCOPIC
Glucose, UA: NEGATIVE mg/dL
Ketones, ur: NEGATIVE mg/dL
Leukocytes, UA: NEGATIVE
Nitrite: NEGATIVE
pH: 7 (ref 5.0–8.0)

## 2012-11-09 LAB — URINE MICROSCOPIC-ADD ON

## 2012-11-09 LAB — COMPREHENSIVE METABOLIC PANEL
ALT: 11 U/L (ref 0–53)
AST: 22 U/L (ref 0–37)
Albumin: 3 g/dL — ABNORMAL LOW (ref 3.5–5.2)
Alkaline Phosphatase: 206 U/L — ABNORMAL HIGH (ref 39–117)
Calcium: 9 mg/dL (ref 8.4–10.5)
GFR calc Af Amer: 45 mL/min — ABNORMAL LOW (ref >90)
Potassium: 3.7 mEq/L (ref 3.5–5.1)
Sodium: 139 mEq/L (ref 135–145)
Total Protein: 7 g/dL (ref 6.0–8.3)

## 2012-11-09 LAB — TROPONIN I: Troponin I: <0.3 ng/mL (ref <0.30)

## 2012-11-09 LAB — RAPID URINE DRUG SCREEN, HOSP PERFORMED
Amphetamines: NOT DETECTED
Benzodiazepines: NOT DETECTED
Opiates: NOT DETECTED

## 2012-11-09 LAB — PROTIME-INR: INR: 1.21 (ref 0.00–1.49)

## 2012-11-09 MED ORDER — OXYCODONE-ACETAMINOPHEN 5-325 MG PO TABS
2.0000 | ORAL_TABLET | Freq: Once | ORAL | Status: AC
  Administered 2012-11-09: 2 via ORAL
  Filled 2012-11-09: qty 2

## 2012-11-09 MED ORDER — NITROGLYCERIN 0.4 MG SL SUBL
0.4000 mg | SUBLINGUAL_TABLET | SUBLINGUAL | Status: DC | PRN
Start: 2012-11-09 — End: 2012-11-09

## 2012-11-09 MED ORDER — MORPHINE SULFATE 4 MG/ML IJ SOLN
4.0000 mg | Freq: Once | INTRAMUSCULAR | Status: DC
  Filled 2012-11-09: qty 1

## 2012-11-09 NOTE — ED Provider Notes (Signed)
History     CSN: [id number]  Arrival date & time 11/08/12  2314   First MD Initiated Contact with Patient 11/09/12 0005      Chief Complaint  Patient presents with  . Chest Pain    (Consider location/radiation/quality/duration/timing/severity/associated sxs/prior treatment) HPI 51 year old male presents to emergency room complaining of chest pressure, left flank pain and mild shortness of breath. Patient with Compazine past medical history of polysubstance abuse, valve replacement surgery 11/5 for mitral and tricuspid. Patient was here in the emergency department visiting his mother who is a patient when he developed chest pressure and shortness of breath. He has been having the left flank pain on and off since his surgery. He denies any fever, no leg swelling, no recent drug use. No cough no congestion. Patient ran out of his pain medicine 3 days ago which is when the flank pain began to flare. Pain in his chest is dull and substernal it does not radiate.  Past Medical History  Diagnosis Date  . Hypertension   . Bipolar disorder   . Schizophrenia     h/o hospitalized inpt therapy @ the [facility]  . 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   Marland Kitchen Hyperlipidemia   . H/O: suicide attempt 1998    was hospitalized at [facility]  . Anemia     Hx of iron infusions  . Chronic diastolic heart failure     Hx of Class IV CHF  . Gastroparesis   . Generalized headaches     sinus  . Arthritis   . Cocaine abuse   . Anxiety   . CHF (congestive heart failure)   . GERD (gastroesophageal reflux disease)   . Heart murmur   . Shortness of breath     with ambulation and sometimes sitting  . Depression   . Vertigo   . Constipation   . Itchy skin   . Chronic kidney disease     has fistula, not on HD    Past Surgical History   Procedure Date  . Mitral valve repair 05/2010  . Av fistula placement, radiocephalic 05/24/2011    Left arm  . Leg surgery   . Umbilical hernia repair 04/01/2012    Procedure: HERNIA REPAIR UMBILICAL ADULT;  Surgeon: Ardeth Sportsman, MD;  Location: [facility] OR;  Service: General;  Laterality: N/A;  supra umbilical  . Tee without cardioversion 08/25/2012    Procedure: TRANSESOPHAGEAL ECHOCARDIOGRAM (TEE);  Surgeon: Wendall Stade, MD;  Location: [facility] ENDOSCOPY;  Service: Cardiovascular;  Laterality: N/A;  . Mitral valve replacement 09/23/2012    Procedure: REDO MITRAL VALVE REPLACEMENT (MVR);  Surgeon: [name], MD;  Location: [facility] OR;  Service: Open Heart Surgery;  Laterality: N/A;  Nitric Oxide  . Tricuspid valve replacement 09/23/2012    Procedure: TRICUSPID VALVE REPAIR;  Surgeon: [name], MD;  Location: [facility] OR;  Service: Open Heart Surgery;  Laterality: N/A;    Family History  Problem Relation Age of Onset  . Hypertension Mother     History  Substance Use Topics  . Smoking status: Former Smoker -- 1.5 packs/day for 30 years    Types: Cigarettes    Quit date: 03/21/2011  . Smokeless tobacco: Never Used  . Alcohol Use: No     Comment: Quit alcohol in July of 2011  Review of Systems  See History of Present Illness; otherwise all other systems are reviewed and negative  Allergies  Cashew nut oil and Food  Home Medications   Current Outpatient Rx  Name  Route  Sig  Dispense  Refill  . ALBUTEROL SULFATE HFA 108 (90 BASE) MCG/ACT IN AERS   Inhalation   Inhale 2 puffs into the lungs every 4 (four) hours as needed. For shortness of breath or wheezing         . ARIPIPRAZOLE 5 MG PO TABS   Oral   Take 5 mg by mouth daily.           . ASPIRIN 81 MG PO TBEC   Oral   Take 81 mg by mouth daily.           . BUDESONIDE-FORMOTEROL FUMARATE 160-4.5 MCG/ACT IN AERO   Inhalation   Inhale 2 puffs into the lungs 2 (two) times daily.   1 Inhaler   1   . CARVEDILOL  6.25 MG PO TABS   Oral   Take 1 tablet (6.25 mg total) by mouth 2 (two) times daily with a meal.   60 tablet   1   . DOCUSATE SODIUM 100 MG PO CAPS   Oral   Take 100 mg by mouth 2 (two) times daily as needed. For constipation         . FUROSEMIDE 40 MG PO TABS   Oral   Take 1 tablet (40 mg total) by mouth daily.   30 tablet   6   . HYDROCODONE-ACETAMINOPHEN 7.5-500 MG PO TABS   Oral   Take 1 tablet by mouth every 4 (four) hours as needed for pain (may take one or two tablets every 4-6 hrs prn).   40 tablet   0   . HYDROXYZINE PAMOATE 50 MG PO CAPS   Oral   Take 50 mg by mouth at bedtime as needed. For sleep         . METOCLOPRAMIDE HCL 10 MG PO TABS   Oral   Take 10 mg by mouth 4 (four) times daily.         Marland Kitchen NICOTINE 14 MG/24HR TD PT24   Transdermal   Place 1 patch onto the skin daily.   28 patch   0   . OMEPRAZOLE 40 MG PO CPDR   Oral   Take 40 mg by mouth 2 (two) times daily as needed. For heart burn         . SILDENAFIL CITRATE 20 MG PO TABS   Oral   Take 20 mg by mouth 2 (two) times daily.           BP 161/83  Pulse 74  Temp 98.4 F (36.9 C) (Oral)  Resp 21  SpO2 99%  Physical Exam  Nursing note and vitals reviewed. Constitutional: He is oriented to person, place, and time. He appears well-developed and well-nourished.  HENT:  Head: Normocephalic and atraumatic.  Nose: Nose normal.  Mouth/Throat: Oropharynx is clear and moist.  Eyes: Conjunctivae normal and EOM are normal. Pupils are equal, round, and reactive to light.  Neck: Normal range of motion. Neck supple. No JVD present. No tracheal deviation present. No thyromegaly present.  Cardiovascular: Normal rate, regular rhythm, normal heart sounds and intact distal pulses.  Exam reveals no gallop and no friction rub.   No murmur heard. Pulmonary/Chest: Effort normal and breath sounds normal. No stridor. No respiratory distress. He has no wheezes. He has  no rales. He exhibits no  tenderness.  Abdominal: Soft. Bowel sounds are normal. He exhibits no distension and no mass. There is no tenderness. There is no rebound and no guarding.  Musculoskeletal: Normal range of motion. He exhibits no edema and no tenderness.  Lymphadenopathy:    He has no cervical adenopathy.  Neurological: He is oriented to person, place, and time. No cranial nerve deficit. He exhibits normal muscle tone. Coordination normal.  Skin: Skin is warm and dry. No rash noted. No erythema. No pallor.  Psychiatric: He has a normal mood and affect. His behavior is normal. Judgment and thought content normal.    ED Course  Procedures (including critical care time)  Labs Reviewed  CBC WITH DIFFERENTIAL - Abnormal; Notable for the following:    RBC 3.37 (*)     Hemoglobin 8.3 (*)     HCT 26.5 (*)     MCH 24.6 (*)     RDW 16.9 (*)     All other components within normal limits  COMPREHENSIVE METABOLIC PANEL - Abnormal; Notable for the following:    Glucose, Bld 107 (*)     BUN 29 (*)     Creatinine, Ser 1.91 (*)     Albumin 3.0 (*)     Alkaline Phosphatase 206 (*)     GFR calc non Af Amer 39 (*)     GFR calc Af Amer 45 (*)     All other components within normal limits  URINALYSIS, ROUTINE W REFLEX MICROSCOPIC - Abnormal; Notable for the following:    Protein, ur 100 (*)     All other components within normal limits  TROPONIN I  PROTIME-INR  URINE RAPID DRUG SCREEN (HOSP PERFORMED)  URINE MICROSCOPIC-ADD ON  TROPONIN I   Dg Chest 2 View  11/09/2012  *RADIOLOGY REPORT*  Clinical Data: Chest pain and shortness of breath.  History of cardiac valve surgery.  CHEST - 2 VIEW  Comparison: 10/22/2012  Findings: Two views of the chest demonstrate postsurgical changes consistent with mitral valve and tricuspid valve surgery.  Again noted is volume loss in the right lung with right pleural fluid. The heart size is stable.  Few patchy densities at the left lung base may represent scarring or atelectasis.   IMPRESSION: Stable postsurgical changes.  Stable volume loss and pleural fluid on the right side.  Scarring or atelectasis at the left lung base.   Original Report Authenticated By: Richarda Overlie, M.D.     Date: 11/08/2012  Rate: 71  Rhythm: normal sinus rhythm  QRS Axis: normal  Intervals: normal  ST/T Wave abnormalities: twave inversions in v1-v5, new in v4, v5  Conduction Disutrbances:none  Narrative Interpretation:   Old EKG Reviewed: changes noted   Date: 11/09/2012  Rate: 80  Rhythm: normal sinus rhythm  QRS Axis: normal  Intervals: normal  ST/T Wave abnormalities: t wave inversion in v1-4, improved overall from prior  Conduction Disutrbances:none  Narrative Interpretation:   Old EKG Reviewed: changes noted      1. Atypical chest pain   2. S/P mitral valve replacement   3. S/P tricuspid valve repair   4. Left flank pain       MDM  51 year old male with left flank pain and chest pain or shortness of breath while visiting mother here in the emergency department. He does have a significant cardiac history of a mitral valve and tricuspid valve replacement 2 months ago. EKG shows new lateral T-wave inversion, but after discussing with cardiology  his most recent cath showed minimal cardiac disease. Patient pain free after Percocet, will have him follow up with his cardiothoracic surgeon and cardiologist.        Olivia Mackie, MD 11/10/12 [id number]

## 2012-11-09 NOTE — ED Notes (Signed)
Pt discharged.Vital signs stable and GCS 15 

## 2012-11-10 ENCOUNTER — Other Ambulatory Visit: Payer: Self-pay | Admitting: *Deleted

## 2012-11-10 ENCOUNTER — Encounter (HOSPITAL_COMMUNITY)
Admission: RE | Admit: 2012-11-10 | Discharge: 2012-11-10 | Disposition: A | Discharge status: Home / Self Care | Payer: Medicaid Other | Source: Ambulatory Visit | Attending: Cardiovascular Disease | Admitting: Cardiovascular Disease

## 2012-11-10 DIAGNOSIS — G8918 Other acute postprocedural pain: Secondary | ICD-10-CM

## 2012-11-10 MED ORDER — HYDROCODONE-ACETAMINOPHEN 7.5-500 MG PO TABS: 1.0000 | ORAL_TABLET | ORAL | Status: DC | PRN

## 2012-11-10 NOTE — Progress Notes (Signed)
Dr Vantright's office called and notified of patients visit to the emergency department.   Ryan Winters plans to take his medications and will return to exercise if his incisional soreness is improved

## 2012-11-10 NOTE — Progress Notes (Signed)
Ryan Winters said he went to the emergency department on Saturday with chest pains and was discharged and told to follow up with his cardiologist.  Ryan Winters's initial blood pressure 140/80.  Ryan Winters said he did not eat breakfast or take his medications this morning. Ryan Winters Dr [facility] nurse called and notified.  Patient has an appointment to see Norma Fredrickson NP on December 31 st at 2:30 pm.  Ryan Winters reports having incisional soreness.

## 2012-11-11 ENCOUNTER — Other Ambulatory Visit (HOSPITAL_COMMUNITY): Payer: Self-pay | Admitting: *Deleted

## 2012-11-13 ENCOUNTER — Encounter (HOSPITAL_COMMUNITY): Payer: Self-pay

## 2012-11-14 ENCOUNTER — Encounter (HOSPITAL_COMMUNITY)
Admission: RE | Admit: 2012-11-14 | Discharge: 2012-11-14 | Disposition: A | Discharge status: Home / Self Care | Payer: Medicaid Other | Source: Ambulatory Visit | Attending: Cardiovascular Disease | Admitting: Cardiovascular Disease

## 2012-11-17 ENCOUNTER — Encounter (HOSPITAL_COMMUNITY): Payer: Medicaid Other

## 2012-11-18 ENCOUNTER — Ambulatory Visit (INDEPENDENT_AMBULATORY_CARE_PROVIDER_SITE_OTHER): Payer: Medicaid Other | Admitting: Nurse Practitioner

## 2012-11-18 ENCOUNTER — Encounter: Payer: Self-pay | Admitting: Nurse Practitioner

## 2012-11-18 VITALS — BP 180/100 | HR 108 | Ht 66.0 in | Wt 151.8 lb

## 2012-11-18 DIAGNOSIS — R0789 Other chest pain: Secondary | ICD-10-CM

## 2012-11-18 NOTE — Progress Notes (Signed)
Ryan Winters Date of Birth: 02/18/1961 Medical Record #[id number]  History of Present Illness: Ryan Winters is seen back today following an ER visit. He is seen for Dr. Excell Seltzer. He has valvular heart disease and underwent mitral valve repair a few years ago. Has continued to have heart failure symptoms with recurrent severe MR. He underwent redo cardiac surgery with bioprosthetivc MVR and tricuspid annuloplasty on November 5th. His other issues include severe malignant HTN and CKD. He has had a past history of polysubstance abuse.   He was last here on December 6th by Dr. Excell Seltzer. Felt to be doing ok. Saw Dr. Maren Beach on the 4th of December and had his pain meds refilled. Was in the ER right before Christmas for atypical chest pain. He has minimal CAD. Given narcotics and told to follow up here and with CT surgery. He has follow up with both physicians in January and February.  He comes in today. He is doing ok. He has had no medicines today. Has not eaten. Feels fine now. Admits that he tends to forget his medicines. Says he is "staying clean". He really has no complaint and says he is fine now. Cardiac rehab recommended him coming in for a check since he had been to the ER.   Current Outpatient Prescriptions on File Prior to Visit  Medication Sig Dispense Refill  . albuterol (PROVENTIL HFA;VENTOLIN HFA) 108 (90 BASE) MCG/ACT inhaler Inhale 2 puffs into the lungs every 4 (four) hours as needed. For shortness of breath or wheezing      . ARIPiprazole (ABILIFY) 5 MG tablet Take 5 mg by mouth daily.        Marland Kitchen aspirin 81 MG EC tablet Take 81 mg by mouth daily.        . budesonide-formoterol (SYMBICORT) 160-4.5 MCG/ACT inhaler Inhale 2 puffs into the lungs 2 (two) times daily.  1 Inhaler  1  . carvedilol (COREG) 6.25 MG tablet Take 1 tablet (6.25 mg total) by mouth 2 (two) times daily with a meal.  60 tablet  1  . docusate sodium (COLACE) 100 MG capsule Take 100 mg by mouth 2 (two) times daily as  needed. For constipation      . furosemide (LASIX) 40 MG tablet Take 1 tablet (40 mg total) by mouth daily.  30 tablet  6  . HYDROcodone-acetaminophen (LORTAB 7.5) 7.5-500 MG per tablet Take 1 tablet by mouth every 4 (four) hours as needed for pain (may take one or two tablets every 4-6 hrs prn).  40 tablet  0  . hydrOXYzine (VISTARIL) 50 MG capsule Take 50 mg by mouth at bedtime as needed. For sleep      . metoCLOPramide (REGLAN) 10 MG tablet Take 10 mg by mouth 4 (four) times daily.      . nicotine (NICODERM CQ) 14 mg/24hr patch Place 1 patch onto the skin daily.  28 patch  0  . omeprazole (PRILOSEC) 40 MG capsule Take 40 mg by mouth 2 (two) times daily as needed. For heart burn      . sildenafil (REVATIO) 20 MG tablet Take 20 mg by mouth 2 (two) times daily.        Allergies  Allergen Reactions  . Cashew Nut Oil Anaphylaxis  . Food Anaphylaxis    Strawberries, Cashews, causes throat to swell - affects breathing    Past Medical History  Diagnosis Date  . Hypertension   . Bipolar disorder   . Schizophrenia     h/o hospitalized  inpt therapy @ the [facility]  . 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   Marland Kitchen Hyperlipidemia   . H/O: suicide attempt 1998    was hospitalized at [facility]  . Anemia     Hx of iron infusions  . Chronic diastolic heart failure     Hx of Class IV CHF  . Gastroparesis   . Generalized headaches     sinus  . Arthritis   . Cocaine abuse   . Anxiety   . CHF (congestive heart failure)   . GERD (gastroesophageal reflux disease)   . Heart murmur   . Shortness of breath     with ambulation and sometimes sitting  . Depression   . Vertigo   . Constipation   . Itchy skin   . Chronic kidney disease     has fistula, not on HD    Past Surgical History  Procedure Date  . Mitral valve repair  05/2010  . Av fistula placement, radiocephalic 05/24/2011    Left arm  . Leg surgery   . Umbilical hernia repair 04/01/2012    Procedure: HERNIA REPAIR UMBILICAL ADULT;  Surgeon: Ardeth Sportsman, MD;  Location: [facility] OR;  Service: General;  Laterality: N/A;  supra umbilical  . Tee without cardioversion 08/25/2012    Procedure: TRANSESOPHAGEAL ECHOCARDIOGRAM (TEE);  Surgeon: Wendall Stade, MD;  Location: [facility] ENDOSCOPY;  Service: Cardiovascular;  Laterality: N/A;  . Mitral valve replacement 09/23/2012    Procedure: REDO MITRAL VALVE REPLACEMENT (MVR);  Surgeon: [name], MD;  Location[facility] OR;  Service: Open Heart Surgery;  Laterality: N/A;  Nitric Oxide  . Tricuspid valve replacement 09/23/2012    Procedure: TRICUSPID VALVE REPAIR;  Surgeon: [name], MD;  Location: [facility] OR;  Service: Open Heart Surgery;  Laterality: N/A;    History  Smoking status  . Former Smoker -- 1.5 packs/day for 30 years  . Types: Cigarettes  . Quit date: 03/21/2011  Smokeless tobacco  . Never Used    History  Alcohol Use No    Comment: Quit alcohol in July of 2011    Family History  Problem Relation Age of Onset  . Hypertension Mother     Review of Systems: The review of systems is per the HPI.  All other systems were reviewed and are negative.  Physical Exam: BP 180/100  Pulse 108  Ht 5\' 6"  (1.676 m)  Wt 151 lb 12.8 oz (68.856 kg)  BMI 24.50 kg/m2  SpO2 95% Patient is alert and in no acute distress. He is a little anxious. Skin is warm and dry. Color is normal.  HEENT is unremarkable. Normocephalic/atraumatic. PERRL. Sclera are nonicteric. Neck is supple. No masses. No JVD. Lungs are clear. Cardiac exam shows a regular rate and rhythm. Rate is fast. Abdomen is soft. Extremities are without edema. Gait and ROM are intact. No gross neurologic deficits noted.  LABORATORY DATA:  EKG shows sinus tach, rate of 104.   Lab Results  Component Value Date   WBC 5.3 11/08/2012   HGB 8.3* 11/08/2012    HCT 26.5* 11/08/2012   PLT 196 11/08/2012   GLUCOSE 107* 11/08/2012   CHOL  Value: 134        ATP III CLASSIFICATION:  <200     mg/dL   Desirable  829-562  mg/dL   Borderline High  >=161    mg/dL   High        0/07/6044   TRIG 39 05/25/2010   HDL 63 05/25/2010   LDLCALC  Value: 63        Total Cholesterol/HDL:CHD Risk Coronary Heart Disease Risk Table                     Men   Women  1/2 Average Risk   3.4   3.3  Average Risk       5.0   4.4  2 X Average Risk   9.6   7.1  3 X Average Risk  23.4   11.0        Use the calculated Patient Ratio above and the CHD Risk Table to determine the patient's CHD Risk.        ATP III CLASSIFICATION (LDL):  <100     mg/dL   Optimal  409-811  mg/dL   Near or Above                    Optimal  130-159  mg/dL   Borderline  914-782  mg/dL   High  >956     mg/dL   Very High 12/20/3084   ALT 11 11/08/2012   AST 22 11/08/2012   NA 139 11/08/2012   K 3.7 11/08/2012   CL 105 11/08/2012   CREATININE 1.91* 11/08/2012   BUN 29* 11/08/2012   CO2 23 11/08/2012   TSH 1.871 05/09/2012   INR 1.21 11/09/2012   HGBA1C 4.7 09/19/2012   MICROALBUR 0.64 05/25/2010   Dg Chest 2 View  11/09/2012  *RADIOLOGY REPORT*  Clinical Data: Chest pain and shortness of breath.  History of cardiac valve surgery.  CHEST - 2 VIEW  Comparison: 10/22/2012  Findings: Two views of the chest demonstrate postsurgical changes consistent with mitral valve and tricuspid valve surgery.  Again noted is volume loss in the right lung with right pleural fluid. The heart size is stable.  Few patchy densities at the left lung base may represent scarring or atelectasis.  IMPRESSION: Stable postsurgical changes.  Stable volume loss and pleural fluid on the right side.  Scarring or atelectasis at the left lung base.   Original Report Authenticated By: Richarda Overlie, M.D.    Assessment / Plan: 1.  HTN - from not having any medicines today. He says he has a BP cuff at home and it has been ok as long as he takes his medicines. I  have asked him to go home and take his medicines and continue to monitor his BP. Call us back if it remains elevated.   2. Atypical chest pain - resolved.  3. Tachycardia - most likely because he has not had any medicines today.  For now, no further changes. Continue with current follow up as previously planned.   Patient is agreeable to this plan and will call if any problems develop in the interim.

## 2012-11-18 NOTE — Patient Instructions (Addendum)
Stay on your current medicines and when you get home - take them  Monitor your blood pressure at home. Call us if your blood pressure is still elevated  Walking every day  Keep your visit with Dr. Maren Beach as planned  Call the [facility] office at [phone] if you have any questions, problems or concerns.

## 2012-11-19 ENCOUNTER — Encounter (HOSPITAL_COMMUNITY): Payer: Medicaid Other

## 2012-11-21 ENCOUNTER — Encounter (HOSPITAL_COMMUNITY): Payer: Medicaid Other

## 2012-11-24 ENCOUNTER — Encounter (HOSPITAL_COMMUNITY): Payer: Medicaid Other

## 2012-11-26 ENCOUNTER — Other Ambulatory Visit (HOSPITAL_COMMUNITY): Payer: Self-pay | Admitting: *Deleted

## 2012-11-26 ENCOUNTER — Ambulatory Visit: Payer: Self-pay | Admitting: Cardiothoracic Surgery

## 2012-11-26 ENCOUNTER — Encounter (HOSPITAL_COMMUNITY)
Admission: RE | Admit: 2012-11-26 | Discharge: 2012-11-26 | Disposition: A | Discharge status: Home / Self Care | Payer: Medicaid Other | Source: Ambulatory Visit | Attending: Cardiovascular Disease | Admitting: Cardiovascular Disease

## 2012-11-26 DIAGNOSIS — I5032 Chronic diastolic (congestive) heart failure: Secondary | ICD-10-CM | POA: Insufficient documentation

## 2012-11-26 DIAGNOSIS — I059 Rheumatic mitral valve disease, unspecified: Secondary | ICD-10-CM | POA: Insufficient documentation

## 2012-11-26 DIAGNOSIS — Z954 Presence of other heart-valve replacement: Secondary | ICD-10-CM | POA: Insufficient documentation

## 2012-11-26 DIAGNOSIS — Z5189 Encounter for other specified aftercare: Secondary | ICD-10-CM | POA: Insufficient documentation

## 2012-11-26 NOTE — Progress Notes (Signed)
Ryan Winters came by  Cardiac rehab to report that he has an upset stomach and vomiting and will not be able to exercise today.  Ryan Winters plans to return on Friday if he is able.

## 2012-11-27 ENCOUNTER — Emergency Department (HOSPITAL_COMMUNITY)
Admission: EM | Admit: 2012-11-27 | Discharge: 2012-11-27 | Disposition: A | Discharge status: Home / Self Care | Payer: Medicaid Other | Attending: Emergency Medicine | Admitting: Emergency Medicine

## 2012-11-27 ENCOUNTER — Encounter (HOSPITAL_COMMUNITY): Payer: Self-pay

## 2012-11-27 ENCOUNTER — Emergency Department (HOSPITAL_COMMUNITY): Payer: Medicaid Other

## 2012-11-27 DIAGNOSIS — R011 Cardiac murmur, unspecified: Secondary | ICD-10-CM | POA: Insufficient documentation

## 2012-11-27 DIAGNOSIS — Z8659 Personal history of other mental and behavioral disorders: Secondary | ICD-10-CM | POA: Insufficient documentation

## 2012-11-27 DIAGNOSIS — E785 Hyperlipidemia, unspecified: Secondary | ICD-10-CM | POA: Insufficient documentation

## 2012-11-27 DIAGNOSIS — F319 Bipolar disorder, unspecified: Secondary | ICD-10-CM | POA: Insufficient documentation

## 2012-11-27 DIAGNOSIS — G8918 Other acute postprocedural pain: Secondary | ICD-10-CM

## 2012-11-27 DIAGNOSIS — I5032 Chronic diastolic (congestive) heart failure: Secondary | ICD-10-CM | POA: Insufficient documentation

## 2012-11-27 DIAGNOSIS — I129 Hypertensive chronic kidney disease with stage 1 through stage 4 chronic kidney disease, or unspecified chronic kidney disease: Secondary | ICD-10-CM | POA: Insufficient documentation

## 2012-11-27 DIAGNOSIS — R112 Nausea with vomiting, unspecified: Secondary | ICD-10-CM | POA: Insufficient documentation

## 2012-11-27 DIAGNOSIS — R079 Chest pain, unspecified: Secondary | ICD-10-CM | POA: Insufficient documentation

## 2012-11-27 DIAGNOSIS — Z87891 Personal history of nicotine dependence: Secondary | ICD-10-CM | POA: Insufficient documentation

## 2012-11-27 DIAGNOSIS — Z7982 Long term (current) use of aspirin: Secondary | ICD-10-CM | POA: Insufficient documentation

## 2012-11-27 DIAGNOSIS — D649 Anemia, unspecified: Secondary | ICD-10-CM | POA: Insufficient documentation

## 2012-11-27 DIAGNOSIS — N189 Chronic kidney disease, unspecified: Secondary | ICD-10-CM | POA: Insufficient documentation

## 2012-11-27 DIAGNOSIS — K219 Gastro-esophageal reflux disease without esophagitis: Secondary | ICD-10-CM | POA: Insufficient documentation

## 2012-11-27 DIAGNOSIS — I509 Heart failure, unspecified: Secondary | ICD-10-CM | POA: Insufficient documentation

## 2012-11-27 DIAGNOSIS — Z954 Presence of other heart-valve replacement: Secondary | ICD-10-CM | POA: Insufficient documentation

## 2012-11-27 DIAGNOSIS — K3184 Gastroparesis: Secondary | ICD-10-CM | POA: Insufficient documentation

## 2012-11-27 DIAGNOSIS — Z8679 Personal history of other diseases of the circulatory system: Secondary | ICD-10-CM | POA: Insufficient documentation

## 2012-11-27 DIAGNOSIS — Z9889 Other specified postprocedural states: Secondary | ICD-10-CM

## 2012-11-27 DIAGNOSIS — Z952 Presence of prosthetic heart valve: Secondary | ICD-10-CM

## 2012-11-27 DIAGNOSIS — Z79899 Other long term (current) drug therapy: Secondary | ICD-10-CM | POA: Insufficient documentation

## 2012-11-27 DIAGNOSIS — Z8739 Personal history of other diseases of the musculoskeletal system and connective tissue: Secondary | ICD-10-CM | POA: Insufficient documentation

## 2012-11-27 LAB — CBC WITH DIFFERENTIAL/PLATELET
Basophils Relative: 0 % (ref 0–1)
Eosinophils Relative: 2 % (ref 0–5)
HCT: 30.6 % — ABNORMAL LOW (ref 39.0–52.0)
Hemoglobin: 9.6 g/dL — ABNORMAL LOW (ref 13.0–17.0)
Lymphocytes Relative: 18 % (ref 12–46)
MCHC: 31.4 g/dL (ref 30.0–36.0)
MCV: 73.7 fL — ABNORMAL LOW (ref 78.0–100.0)
Monocytes Absolute: 0.4 10*3/uL (ref 0.1–1.0)
Monocytes Relative: 8 % (ref 3–12)
Neutro Abs: 3.4 10*3/uL (ref 1.7–7.7)
RDW: 17 % — ABNORMAL HIGH (ref 11.5–15.5)

## 2012-11-27 LAB — COMPREHENSIVE METABOLIC PANEL
ALT: 11 U/L (ref 0–53)
Albumin: 3.5 g/dL (ref 3.5–5.2)
Alkaline Phosphatase: 162 U/L — ABNORMAL HIGH (ref 39–117)
Calcium: 9.7 mg/dL (ref 8.4–10.5)
GFR calc Af Amer: 33 mL/min — ABNORMAL LOW (ref >90)
Glucose, Bld: 110 mg/dL — ABNORMAL HIGH (ref 70–99)
Potassium: 3.4 mEq/L — ABNORMAL LOW (ref 3.5–5.1)
Sodium: 138 mEq/L (ref 135–145)
Total Protein: 7.6 g/dL (ref 6.0–8.3)

## 2012-11-27 LAB — RAPID URINE DRUG SCREEN, HOSP PERFORMED
Benzodiazepines: NOT DETECTED
Opiates: NOT DETECTED

## 2012-11-27 LAB — TROPONIN I: Troponin I: <0.3 ng/mL (ref <0.30)

## 2012-11-27 MED ORDER — HYDROCODONE-ACETAMINOPHEN 7.5-325 MG PO TABS: 1.0000 | ORAL_TABLET | Freq: Four times a day (QID) | ORAL | Status: DC | PRN

## 2012-11-27 MED ORDER — ONDANSETRON HCL 4 MG/2ML IJ SOLN
4.0000 mg | Freq: Once | INTRAMUSCULAR | Status: AC
  Administered 2012-11-27: 4 mg via INTRAVENOUS
  Filled 2012-11-27: qty 2

## 2012-11-27 MED ORDER — HYDROMORPHONE HCL PF 1 MG/ML IJ SOLN
1.0000 mg | Freq: Once | INTRAMUSCULAR | Status: AC
  Administered 2012-11-27: 1 mg via INTRAVENOUS
  Filled 2012-11-27: qty 1

## 2012-11-27 MED ORDER — LACTATED RINGERS IV BOLUS (SEPSIS)
500.0000 mL | Freq: Once | INTRAVENOUS | Status: AC
  Administered 2012-11-27: 03:00:00 via INTRAVENOUS

## 2012-11-27 MED ORDER — FERUMOXYTOL INJECTION 510 MG/17 ML
510.0000 mg | Freq: Once | INTRAVENOUS | Status: AC
  Administered 2012-11-27: 510 mg via INTRAVENOUS
  Filled 2012-11-27: qty 17

## 2012-11-27 MED ORDER — METOCLOPRAMIDE HCL 5 MG/ML IJ SOLN
10.0000 mg | Freq: Once | INTRAMUSCULAR | Status: AC
  Administered 2012-11-27: 10 mg via INTRAVENOUS
  Filled 2012-11-27: qty 2

## 2012-11-27 MED ORDER — DIPHENHYDRAMINE HCL 50 MG/ML IJ SOLN
25.0000 mg | Freq: Once | INTRAMUSCULAR | Status: AC
  Administered 2012-11-27: 25 mg via INTRAVENOUS
  Filled 2012-11-27: qty 1

## 2012-11-27 NOTE — ED Notes (Signed)
Pt given [place] sandwich, and apple and orange juice.

## 2012-11-27 NOTE — ED Notes (Signed)
Pt reports having left-sided chest pain that radiates down his left arm; pt describes pain as "sharp and shooting"; pain 9/10; pt had a heart valve replacement about 6 weeks ago; pt reports pain starting since there with no relief; pt has had some nausea and vomiting for the past couple of days; pt also reports some shortness of breath;

## 2012-11-27 NOTE — ED Provider Notes (Signed)
History     CSN: [id number]  Arrival date & time 11/27/12  0206   First MD Initiated Contact with Patient 11/27/12 0220      Chief Complaint  Patient presents with  . Chest Pain    (Consider location/radiation/quality/duration/timing/severity/associated sxs/prior treatment) HPIAntonio Winters is a 52 y.o. male with a past medical history significant for polysubstance abuse, alcohol abuse, who 8 weeks ago approximately, had mitral valve repair.  Patient presents today with left-sided chest pain and left arm pain is described as sharp, radiating down the left arm. This pain started earlier last evening, he said his arm was "jumping" pain associated with a sharp pain. He took a nitroglycerin and it helped out, he is able to go to sleep but awoke again with the same sharp pain down his left arm at 11 PM. He says his arm is sharp, non-and jumping. He also says she's been having pain from his gastroparesis. This is moderate, is been associated with vomiting, 6 times the last 2 days-he may have vomited some of his medications up. He has doubled on some of the doses when he thought he vomited his meds.  Patient's review of systems is positive for a subjective fever and some chills, a cough minimally productive with small amounts of phlegm, no diarrhea, no melena, no hematochezia-small amounts of blood on the toilet paper, difficulty keeping food down, no headaches, changes in vision or double vision, and no orthopnea. No increased swelling of the lower extremities.  Left arm "started jumping" sharp pain, 9/10, left arm + chest, tightness in chest. nitro helped @ 11pm, woke up again vomiting - arm jumping and numb.    Past Medical History  Diagnosis Date  . Hypertension   . Bipolar disorder   . Schizophrenia     h/o hospitalized inpt therapy @ the [facility]  . MVP (mitral valve prolapse)     with severe MR s/p repair with annuloplasty and cleft MV repair in 09/2010, last echo June  2013 showed EF 55-60%; restrictive physiology; mod MR, severe TR.   Marland Kitchen Hyperlipidemia   . H/O: suicide attempt 1998    was hospitalized at [facility]  . Anemia     Hx of iron infusions  . Chronic diastolic heart failure     Hx of Class IV CHF  . Gastroparesis   . Generalized headaches     sinus  . Arthritis   . Cocaine abuse   . Anxiety   . CHF (congestive heart failure)   . GERD (gastroesophageal reflux disease)   . Heart murmur   . Shortness of breath     with ambulation and sometimes sitting  . Depression   . Vertigo   . Constipation   . Itchy skin   . Chronic kidney disease     has fistula, not on HD    Past Surgical History  Procedure Date  . Mitral valve repair 05/2010  . Av fistula placement, radiocephalic 05/24/2011    Left arm  . Leg surgery   . Umbilical hernia repair 04/01/2012    Procedure: HERNIA REPAIR UMBILICAL ADULT;  Surgeon: Ardeth Sportsman, MD;  Location: [facility] OR;  Service: General;  Laterality: N/A;  supra umbilical  . Tee without cardioversion 08/25/2012    Procedure: TRANSESOPHAGEAL ECHOCARDIOGRAM (TEE);  Surgeon: Wendall Stade, MD;  Location: [facility] ENDOSCOPY;  Service: Cardiovascular;  Laterality: N/A;  . Mitral valve replacement 09/23/2012    Procedure: REDO MITRAL VALVE REPLACEMENT (MVR);  Surgeon: [name], MD;  Location: [facility] OR;  Service: Open Heart Surgery;  Laterality: N/A;  Nitric Oxide  . Tricuspid valve replacement 09/23/2012    Procedure: TRICUSPID VALVE REPAIR;  Surgeon: [name], MD;  Location: [facility] OR;  Service: Open Heart Surgery;  Laterality: N/A;    Family History  Problem Relation Age of Onset  . Hypertension Mother     History  Substance Use Topics  . Smoking status: Former Smoker -- 1.5 packs/day for 30 years    Types: Cigarettes    Quit date: 03/21/2011  . Smokeless tobacco: Never Used  . Alcohol Use: No     Comment: Quit alcohol in July of 2011      Review of Systems At least 10pt or greater review of systems  completed and are negative except where specified in the HPI.  Allergies  Cashew nut oil and Food  Home Medications   Current Outpatient Rx  Name  Route  Sig  Dispense  Refill  . ALBUTEROL SULFATE HFA 108 (90 BASE) MCG/ACT IN AERS   Inhalation   Inhale 2 puffs into the lungs every 4 (four) hours as needed. For shortness of breath or wheezing         . ARIPIPRAZOLE 5 MG PO TABS   Oral   Take 5 mg by mouth daily.           . ASPIRIN 81 MG PO TBEC   Oral   Take 81 mg by mouth daily.           . BUDESONIDE-FORMOTEROL FUMARATE 160-4.5 MCG/ACT IN AERO   Inhalation   Inhale 2 puffs into the lungs 2 (two) times daily.   1 Inhaler   1   . CARVEDILOL 6.25 MG PO TABS   Oral   Take 1 tablet (6.25 mg total) by mouth 2 (two) times daily with a meal.   60 tablet   1   . DOCUSATE SODIUM 100 MG PO CAPS   Oral   Take 100 mg by mouth 2 (two) times daily as needed. For constipation         . FUROSEMIDE 40 MG PO TABS   Oral   Take 1 tablet (40 mg total) by mouth daily.   30 tablet   6   . HYDROCODONE-ACETAMINOPHEN 7.5-500 MG PO TABS   Oral   Take 1 tablet by mouth every 4 (four) hours as needed for pain (may take one or two tablets every 4-6 hrs prn).   40 tablet   0   . HYDROXYZINE PAMOATE 50 MG PO CAPS   Oral   Take 50 mg by mouth at bedtime as needed. For sleep         . METOCLOPRAMIDE HCL 10 MG PO TABS   Oral   Take 10 mg by mouth 4 (four) times daily.         Marland Kitchen NICOTINE 14 MG/24HR TD PT24   Transdermal   Place 1 patch onto the skin daily.   28 patch   0   . OMEPRAZOLE 40 MG PO CPDR   Oral   Take 40 mg by mouth 2 (two) times daily as needed. For heart burn         . SILDENAFIL CITRATE 20 MG PO TABS   Oral   Take 20 mg by mouth 2 (two) times daily.           BP 168/106  Pulse 100  Temp 98.4 F (  36.9 C) (Oral)  Resp 18  SpO2 96%  Physical Exam  Nursing notes reviewed.  Electronic medical record reviewed. VITAL SIGNS:   Filed Vitals:    11/27/12 0211  BP: 168/106  Pulse: 100  Temp: 98.4 F (36.9 C)  TempSrc: Oral  Resp: 18  SpO2: 96%   CONSTITUTIONAL: Awake, oriented, appears non-toxic HENT: Atraumatic, normocephalic, oral mucosa pink and moist, airway patent. Nares patent without drainage. External ears normal. EYES: Conjunctiva clear, EOMI, PERRLA NECK: Trachea midline, non-tender, supple CARDIOVASCULAR: Normal heart rate, Normal rhythm, No murmurs, rubs, gallops. Well-healed median sternotomy scar PULMONARY/CHEST: Clear to auscultation, no rhonchi, wheezes, or rales. Symmetrical breath sounds. Non-tender. ABDOMINAL: Non-distended, soft, non-tender - no rebound or guarding.  BS normal. NEUROLOGIC: Non-focal, moving all four extremities, no gross sensory or motor deficits. EXTREMITIES: No clubbing, cyanosis, or edema SKIN: Warm, Dry, No erythema, No rash  ED Course  Procedures (including critical care time)  Date: 11/27/2012  Rate: 100  Rhythm: Sinus tachycardia  QRS Axis: normal  Intervals: normal  ST/T Wave abnormalities: Inverted T waves inferior leads - seen on prior EKG 12/31, some new nonspecific flattening in V3  Conduction Disutrbances: LVH  Narrative Interpretation: unremarkable -no significant changes from prior EKG from 10 days ago   Date: 11/27/2012  Rate: 99  Rhythm: normal sinus rhythm  QRS Axis: normal  Intervals: normal  ST/T Wave abnormalities: normal  Conduction Disutrbances: LVH  Narrative Interpretation: unremarkable no significant change from prior EKG from earlier this evening; no signs of acute ischemia or infarct     Labs Reviewed  COMPREHENSIVE METABOLIC PANEL - Abnormal; Notable for the following:    Potassium 3.4 (*)     Glucose, Bld 110 (*)     BUN 28 (*)     Creatinine, Ser 2.45 (*)     Alkaline Phosphatase 162 (*)     GFR calc non Af Amer 29 (*)     GFR calc Af Amer 33 (*)     All other components within normal limits  CBC WITH DIFFERENTIAL - Abnormal; Notable for  the following:    RBC 4.15 (*)     Hemoglobin 9.6 (*)     HCT 30.6 (*)     MCV 73.7 (*)     MCH 23.1 (*)     RDW 17.0 (*)     All other components within normal limits  URINE RAPID DRUG SCREEN (HOSP PERFORMED)  TROPONIN I  POCT I-STAT TROPONIN I   Dg Chest 2 View  11/27/2012  *RADIOLOGY REPORT*  Clinical Data: Left side chest pain.  CHEST - 2 VIEW  Comparison: PA and lateral chest 11/09/2012.  Findings: The patient is status post aortic and mitral valve surgery.  Small right pleural effusion basilar atelectasis are unchanged.  Left lung is clear.  There is cardiomegaly.  No pneumothorax.  IMPRESSION: No change in right pleural effusion and basilar atelectasis.   Original Report Authenticated By: Holley Dexter, M.D.      1. Chest pain   2. S/P mitral valve replacement   3. S/P tricuspid valve repair   4. N&V (nausea and vomiting)   5. Gastroparesis   6. Chronic anemia   7. Post-op pain       MDM  Cloyde Oregel is a 52 y.o. male Presenting with pain that he says is typical of his gastroparesis pain and also some left arm pain-it seems like his pain is localized mostly in the left shoulder and arm and does  not include his chest however with recent history of mitral valve replacement and tricuspid valve repair, as well as polysubstance abuse in the past will rule patient out for ACS-I think the patient is not having an acute coronary syndrome at this time. Patient's had some shortness of breath I do not think he's got a PE at this time. Will give him some pain medicine and some Reglan IV to deal with his gastroparesis pain.  Patient is feeling better after IV fluid and medicine. Patient labs are unremarkable and close to the patient's baseline. He said no cough, and chest x-ray does not support a pneumonia. He does have a right pleural effusion and some basilar atelectasis this is comparable with prior chest x-rays.  EKGs x2 as well as troponins x2 are negative for acute infarction  or ischemia.  Patient is pain-free at this time. He is feeling much better.  We'll give the patient his iron infusion at his request which she is supposed to received in short stay today (at 11:00) So that he can get home and get some sleep. He has a followup appointment with cardiology Dr. Excell Seltzer in a few days. Patient says he is out of pain medicine, we'll give him some pain medicine until he can see his cardiologist on the 12th or 13th.   I explained the diagnosis and have given explicit precautions to return to the ER including any other new or worsening symptoms. The patient understands and accepts the medical plan as it's been dictated and I have answered their questions. Discharge instructions concerning home care and prescriptions have been given.  The patient is STABLE and is discharged to home in good condition.        Jones Skene, MD 11/27/12 [phone]

## 2012-11-28 ENCOUNTER — Telehealth: Payer: Self-pay | Admitting: Cardiovascular Disease

## 2012-11-28 ENCOUNTER — Other Ambulatory Visit: Payer: Self-pay | Admitting: *Deleted

## 2012-11-28 ENCOUNTER — Encounter (HOSPITAL_COMMUNITY): Payer: Medicaid Other

## 2012-11-28 ENCOUNTER — Other Ambulatory Visit: Payer: Self-pay

## 2012-11-28 ENCOUNTER — Emergency Department (HOSPITAL_COMMUNITY): Payer: Medicaid Other

## 2012-11-28 ENCOUNTER — Emergency Department (HOSPITAL_COMMUNITY)
Admission: EM | Admit: 2012-11-28 | Discharge: 2012-11-28 | Disposition: A | Discharge status: Home / Self Care | Payer: Medicaid Other | Source: Home / Self Care | Attending: Emergency Medicine | Admitting: Emergency Medicine

## 2012-11-28 ENCOUNTER — Encounter (HOSPITAL_COMMUNITY): Payer: Self-pay | Admitting: Emergency Medicine

## 2012-11-28 DIAGNOSIS — R0602 Shortness of breath: Secondary | ICD-10-CM | POA: Insufficient documentation

## 2012-11-28 DIAGNOSIS — R0789 Other chest pain: Secondary | ICD-10-CM

## 2012-11-28 DIAGNOSIS — I5032 Chronic diastolic (congestive) heart failure: Secondary | ICD-10-CM

## 2012-11-28 DIAGNOSIS — Z872 Personal history of diseases of the skin and subcutaneous tissue: Secondary | ICD-10-CM | POA: Insufficient documentation

## 2012-11-28 DIAGNOSIS — R110 Nausea: Secondary | ICD-10-CM | POA: Insufficient documentation

## 2012-11-28 DIAGNOSIS — IMO0002 Reserved for concepts with insufficient information to code with codable children: Secondary | ICD-10-CM | POA: Insufficient documentation

## 2012-11-28 DIAGNOSIS — Z862 Personal history of diseases of the blood and blood-forming organs and certain disorders involving the immune mechanism: Secondary | ICD-10-CM | POA: Insufficient documentation

## 2012-11-28 DIAGNOSIS — Z8669 Personal history of other diseases of the nervous system and sense organs: Secondary | ICD-10-CM | POA: Insufficient documentation

## 2012-11-28 DIAGNOSIS — R011 Cardiac murmur, unspecified: Secondary | ICD-10-CM | POA: Insufficient documentation

## 2012-11-28 DIAGNOSIS — R1084 Generalized abdominal pain: Secondary | ICD-10-CM | POA: Insufficient documentation

## 2012-11-28 DIAGNOSIS — Z79899 Other long term (current) drug therapy: Secondary | ICD-10-CM | POA: Insufficient documentation

## 2012-11-28 DIAGNOSIS — I129 Hypertensive chronic kidney disease with stage 1 through stage 4 chronic kidney disease, or unspecified chronic kidney disease: Secondary | ICD-10-CM | POA: Insufficient documentation

## 2012-11-28 DIAGNOSIS — Z954 Presence of other heart-valve replacement: Secondary | ICD-10-CM | POA: Insufficient documentation

## 2012-11-28 DIAGNOSIS — Z8739 Personal history of other diseases of the musculoskeletal system and connective tissue: Secondary | ICD-10-CM | POA: Insufficient documentation

## 2012-11-28 DIAGNOSIS — N189 Chronic kidney disease, unspecified: Secondary | ICD-10-CM | POA: Insufficient documentation

## 2012-11-28 DIAGNOSIS — F319 Bipolar disorder, unspecified: Secondary | ICD-10-CM | POA: Insufficient documentation

## 2012-11-28 DIAGNOSIS — R1115 Cyclical vomiting syndrome unrelated to migraine: Secondary | ICD-10-CM | POA: Insufficient documentation

## 2012-11-28 DIAGNOSIS — F1410 Cocaine abuse, uncomplicated: Secondary | ICD-10-CM | POA: Insufficient documentation

## 2012-11-28 DIAGNOSIS — K219 Gastro-esophageal reflux disease without esophagitis: Secondary | ICD-10-CM | POA: Insufficient documentation

## 2012-11-28 DIAGNOSIS — I340 Nonrheumatic mitral (valve) insufficiency: Secondary | ICD-10-CM

## 2012-11-28 DIAGNOSIS — Z8719 Personal history of other diseases of the digestive system: Secondary | ICD-10-CM | POA: Insufficient documentation

## 2012-11-28 DIAGNOSIS — Z9889 Other specified postprocedural states: Secondary | ICD-10-CM | POA: Insufficient documentation

## 2012-11-28 DIAGNOSIS — Z7982 Long term (current) use of aspirin: Secondary | ICD-10-CM | POA: Insufficient documentation

## 2012-11-28 DIAGNOSIS — Z8659 Personal history of other mental and behavioral disorders: Secondary | ICD-10-CM | POA: Insufficient documentation

## 2012-11-28 DIAGNOSIS — E785 Hyperlipidemia, unspecified: Secondary | ICD-10-CM | POA: Insufficient documentation

## 2012-11-28 DIAGNOSIS — Z87891 Personal history of nicotine dependence: Secondary | ICD-10-CM | POA: Insufficient documentation

## 2012-11-28 LAB — CBC
HCT: 33.2 % — ABNORMAL LOW (ref 39.0–52.0)
MCH: 23.1 pg — ABNORMAL LOW (ref 26.0–34.0)
MCV: 75.3 fL — ABNORMAL LOW (ref 78.0–100.0)
RBC: 4.41 MIL/uL (ref 4.22–5.81)
WBC: 5 10*3/uL (ref 4.0–10.5)

## 2012-11-28 LAB — RAPID URINE DRUG SCREEN, HOSP PERFORMED
Amphetamines: POSITIVE — AB
Barbiturates: NOT DETECTED
Benzodiazepines: NOT DETECTED
Cocaine: NOT DETECTED
Opiates: POSITIVE — AB
Tetrahydrocannabinol: NOT DETECTED

## 2012-11-28 LAB — BASIC METABOLIC PANEL
BUN: 30 mg/dL — ABNORMAL HIGH (ref 6–23)
CO2: 26 mEq/L (ref 19–32)
Calcium: 9.7 mg/dL (ref 8.4–10.5)
Chloride: 101 mEq/L (ref 96–112)
Creatinine, Ser: 2.6 mg/dL — ABNORMAL HIGH (ref 0.50–1.35)

## 2012-11-28 MED ORDER — HYDROMORPHONE HCL PF 1 MG/ML IJ SOLN
1.0000 mg | Freq: Once | INTRAMUSCULAR | Status: AC
  Administered 2012-11-28: 1 mg via INTRAVENOUS
  Filled 2012-11-28: qty 1

## 2012-11-28 MED ORDER — FUROSEMIDE 40 MG PO TABS: 40.0000 mg | ORAL_TABLET | Freq: Every day | ORAL | Status: DC

## 2012-11-28 MED ORDER — ONDANSETRON HCL 4 MG/2ML IJ SOLN
4.0000 mg | Freq: Once | INTRAMUSCULAR | Status: AC
  Administered 2012-11-28: 4 mg via INTRAVENOUS
  Filled 2012-11-28: qty 2

## 2012-11-28 MED ORDER — SODIUM CHLORIDE 0.9 % IV BOLUS (SEPSIS)
1000.0000 mL | Freq: Once | INTRAVENOUS | Status: AC
  Administered 2012-11-28: 1000 mL via INTRAVENOUS

## 2012-11-28 MED ORDER — PROMETHAZINE HCL 25 MG RE SUPP: 25.0000 mg | Freq: Four times a day (QID) | RECTAL | Status: DC | PRN

## 2012-11-28 MED ORDER — CARVEDILOL 6.25 MG PO TABS
6.2500 mg | ORAL_TABLET | Freq: Two times a day (BID) | ORAL | Status: DC
  Filled 2012-11-28 (×2): qty 1

## 2012-11-28 MED ORDER — ASPIRIN 325 MG PO TABS
325.0000 mg | ORAL_TABLET | Freq: Once | ORAL | Status: AC
  Administered 2012-11-28: 325 mg via ORAL
  Filled 2012-11-28: qty 1

## 2012-11-28 NOTE — ED Provider Notes (Signed)
History     CSN: [id number]  Arrival date & time 11/28/12  1000   First MD Initiated Contact with Patient 11/28/12 1008      Chief Complaint  Patient presents with  . Abdominal Pain  . Chest Pain    (Consider location/radiation/quality/duration/timing/severity/associated sxs/prior treatment) HPI Comments: Ryan Winters is a 52 y.o. male with a history of polysubstance abuse, mitral valve prolapse (repair her approximately 8 weeks ago), hypertension CKD, and schizophrenia presents emergency department complaining of abdominal pain and chest pain.  Patient was evaluated for similar presentation yesterday and states that he's returned because he is unable to keep down his pain medications due to hyperemesis with acute onset this morning.  Patient states his abdominal pain and hyperemesis is consistent with past gastroparesis episodes in that his abdominal pain has been unchanged since yesterday.  Patient is noncompliant with attempts to obtain a history stating that it hasn't changed since yesterday and the note from previous provider has all the information, however he does describe the CP as sharp with radiation to left arm.   The history is provided by the patient, medical records and the spouse.    Past Medical History  Diagnosis Date  . Hypertension   . Bipolar disorder   . Schizophrenia     h/o hospitalized inpt therapy @ the [facility]  . 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   Marland Kitchen Hyperlipidemia   . H/O: suicide attempt 1998    was hospitalized at [facility]  . Anemia     Hx of iron infusions  . Chronic diastolic heart failure     Hx of Class IV CHF  . Gastroparesis   . Generalized headaches     sinus  . Arthritis   . Cocaine abuse   . Anxiety   . CHF (congestive heart failure)   . GERD (gastroesophageal  reflux disease)   . Heart murmur   . Shortness of breath     with ambulation and sometimes sitting  . Depression   . Vertigo   . Constipation   . Itchy skin   . Chronic kidney disease     has fistula, not on HD    Past Surgical History  Procedure Date  . Mitral valve repair 05/2010  . Av fistula placement, radiocephalic 05/24/2011    Left arm  . Leg surgery   . Umbilical hernia repair 04/01/2012    Procedure: HERNIA REPAIR UMBILICAL ADULT;  Surgeon: Ardeth Sportsman, MD;  Location[facility] OR;  Service: General;  Laterality: N/A;  supra umbilical  . Tee without cardioversion 08/25/2012    Procedure: TRANSESOPHAGEAL ECHOCARDIOGRAM (TEE);  Surgeon: Wendall Stade, MD;  Location: [facility] ENDOSCOPY;  Service: Cardiovascular;  Laterality: N/A;  . Mitral valve replacement 09/23/2012    Procedure: REDO MITRAL VALVE REPLACEMENT (MVR);  Surgeon: [name], MD;  Location: [facility] OR;  Service: Open Heart Surgery;  Laterality: N/A;  Nitric Oxide  . Tricuspid valve replacement 09/23/2012    Procedure: TRICUSPID VALVE REPAIR;  Surgeon: [name], MD;  Location: [facility] OR;  Service: Open Heart Surgery;  Laterality: N/A;    Family History  Problem Relation Age of Onset  . Hypertension Mother     History  Substance Use Topics  . Smoking status: Former Smoker -- 1.5 packs/day for 30  years    Types: Cigarettes    Quit date: 03/21/2011  . Smokeless tobacco: Never Used  . Alcohol Use: No     Comment: Quit alcohol in July of 2011      Review of Systems  Constitutional: Positive for activity change. Negative for fever and diaphoresis.  HENT: Negative for congestion and neck pain.   Respiratory: Positive for shortness of breath. Negative for cough and wheezing.   Cardiovascular: Positive for chest pain and palpitations. Negative for leg swelling.  Gastrointestinal: Positive for nausea, vomiting and abdominal pain.  Genitourinary: Negative for dysuria.  Musculoskeletal: Negative for myalgias.  Skin:  Negative for color change and wound.  Neurological: Negative for headaches.  All other systems reviewed and are negative.    Allergies  Cashew nut oil and Food  Home Medications   Current Outpatient Rx  Name  Route  Sig  Dispense  Refill  . ALBUTEROL SULFATE HFA 108 (90 BASE) MCG/ACT IN AERS   Inhalation   Inhale 2 puffs into the lungs every 4 (four) hours as needed. For shortness of breath or wheezing         . ARIPIPRAZOLE 5 MG PO TABS   Oral   Take 5 mg by mouth daily.           . ASPIRIN 81 MG PO TBEC   Oral   Take 81 mg by mouth daily.           . BUDESONIDE-FORMOTEROL FUMARATE 160-4.5 MCG/ACT IN AERO   Inhalation   Inhale 2 puffs into the lungs 2 (two) times daily.   1 Inhaler   1   . CARVEDILOL 6.25 MG PO TABS   Oral   Take 1 tablet (6.25 mg total) by mouth 2 (two) times daily with a meal.   60 tablet   1   . DOCUSATE SODIUM 100 MG PO CAPS   Oral   Take 100 mg by mouth 2 (two) times daily as needed. For constipation         . FUROSEMIDE 40 MG PO TABS   Oral   Take 1 tablet (40 mg total) by mouth daily.   30 tablet   6   . HYDROCODONE-ACETAMINOPHEN 7.5-500 MG PO TABS   Oral   Take 1 tablet by mouth every 4 (four) hours as needed for pain (may take one or two tablets every 4-6 hrs prn).   40 tablet   0   . HYDROCODONE-ACETAMINOPHEN 7.5-325 MG PO TABS   Oral   Take 1 tablet by mouth every 6 (six) hours as needed for pain.   17 tablet   0   . HYDROXYZINE PAMOATE 50 MG PO CAPS   Oral   Take 50 mg by mouth at bedtime as needed. For sleep         . METOCLOPRAMIDE HCL 10 MG PO TABS   Oral   Take 10 mg by mouth 4 (four) times daily.         Marland Kitchen NICOTINE 14 MG/24HR TD PT24   Transdermal   Place 1 patch onto the skin daily.   28 patch   0   . OMEPRAZOLE 40 MG PO CPDR   Oral   Take 40 mg by mouth 2 (two) times daily as needed. For heart burn         . SILDENAFIL CITRATE 20 MG PO TABS   Oral   Take 20 mg by mouth 2 (two) times  daily.  BP 167/113  Pulse 100  Temp 97.8 F (36.6 C) (Oral)  Resp 20  SpO2 100%  Physical Exam  Nursing note and vitals reviewed. Constitutional: He is oriented to person, place, and time. He appears well-developed and well-nourished. He appears distressed.  HENT:  Head: Normocephalic and atraumatic.  Eyes: Conjunctivae normal and EOM are normal.  Neck: Normal range of motion. JVD present.  Cardiovascular:       Irregular rhythm, tachycardic, intact distal pulses.  Sternotomy scar present.  Pulmonary/Chest:       Decreased lung sounds in right lower lung field.  No Rales, wheezing, or rhonchi.  Mild increase in respiratory effort  Abdominal:       Soft diffusely tender abdomen without localization.  No peritoneal signs.  Musculoskeletal: Normal range of motion.  Neurological: He is alert and oriented to person, place, and time.  Skin: Skin is warm and dry. No rash noted. He is not diaphoretic.  Psychiatric: His affect is angry. He is agitated and aggressive.       Combative behavior    ED Course  Procedures (including critical care time)   Labs Reviewed  CBC  BASIC METABOLIC PANEL  PRO B NATRIURETIC PEPTIDE  ETHANOL  LIPASE, BLOOD  URINE RAPID DRUG SCREEN (HOSP PERFORMED)   Dg Chest 2 View  11/27/2012  *RADIOLOGY REPORT*  Clinical Data: Left side chest pain.  CHEST - 2 VIEW  Comparison: PA and lateral chest 11/09/2012.  Findings: The patient is status post aortic and mitral valve surgery.  Small right pleural effusion basilar atelectasis are unchanged.  Left lung is clear.  There is cardiomegaly.  No pneumothorax.  IMPRESSION: No change in right pleural effusion and basilar atelectasis.   Original Report Authenticated By: Holley Dexter, M.D.     Date: 11/28/2012  Rate: 112  Rhythm: accelerated atopic rhythm   QRS Axis: normal  Intervals: normal  ST/T Wave abnormalities: nonspecific T wave changes, seen on prior  Conduction Disutrbances: LVH  Narrative  Interpretation:  Old EKG Reviewed: no signs of acute ischemia or infarct   12:55 PM  Pt requesting meal. Will evaluate ability to hold down PO.   No diagnosis found.  2:04 PM Pt states he is pain free and was able to keep down a meal  BP 153/98  Pulse 109  Temp 97.8 F (36.6 C) (Oral)  Resp 21  SpO2 98%   MDM  Pt with a hx of atypical chest pain evaluated yesterday, polysubstance abuse, MVP & CHF presents to ER c/o abdominal pain complicated by hyperemesis & atypical CP that is sharp lasting ~30 seconds. Low concern for cardiac etiology of CP, however will check ECG, trop & treat pain. IVF given and PO challenge after antiemetics. Anticipate dc w PCP/Cardiology follow up.   Patient is to be discharged with recommendation to follow up with PCP & cardiologist in regards to today's hospital visit. Chest pain is not likely of cardiac or pulmonary etiology d/t presentation, VSS, no tracheal deviation, RRR, EKG without acute abnormalities, negative troponin, and stable CXR. Pt has been advised to return to the ED is CP becomes exertional, associated with diaphoresis or nausea, radiates to left jaw/arm, worsens or becomes concerning in any way. Pt appears reliable for follow up and is agreeable to discharge.   Case has been discussed with and seen by Dr. Blinda Leatherwood who agrees with the above plan to discharge.            Jaci Carrel, [place] 11/28/12 1404  Jaci Carrel, [place] 11/28/12 1407

## 2012-11-28 NOTE — Telephone Encounter (Signed)
Pt's wife called stating that pt was in the ER 11/27/12 with Chest tightness, left arm nobmness  Vomiting. According to wife, pt's   symptoms got better, because he was given pain medication, and was send home. Today pt started having chest tightness again which is worse then when he was is the ER, and vomiting. Pt has a history of alcohol abuse and Gastroparesis. Pt wants to be seen today.

## 2012-11-28 NOTE — Telephone Encounter (Signed)
Norma Fredrickson NP aware of pt's symptoms; NP  recommends for pt to call his PCP to be seen. Wife verbalized understanding.

## 2012-11-28 NOTE — ED Notes (Signed)
Pt was able to tolerate sandwich and drink.

## 2012-11-28 NOTE — ED Notes (Signed)
Pt presenting to ed with c/o seen at [facility] yesterday for the same. Pt states "it feels like I'm having a heart attack" pt states he is having left side chest pain onset x 2 days that radiates into his left arm pt states intermittent shortness of breath. Pt states he is also having abdominal pain with positive nausea and vomiting. Pt denies diarrhea at this time

## 2012-11-28 NOTE — Telephone Encounter (Signed)
New Problem:    Patient's wife called in because the patient is still not feeling well after being discharged from the hospital yesterday and she would like him to be seen today.  Please call back.

## 2012-11-29 ENCOUNTER — Encounter (HOSPITAL_COMMUNITY): Payer: Self-pay | Admitting: Emergency Medicine

## 2012-11-29 ENCOUNTER — Emergency Department (HOSPITAL_COMMUNITY): Payer: Medicaid Other

## 2012-11-29 ENCOUNTER — Inpatient Hospital Stay (HOSPITAL_COMMUNITY)
Admission: EM | Admit: 2012-11-29 | Discharge: 2012-12-05 | DRG: 308 | Disposition: A | Discharge status: Home / Self Care | Payer: Medicaid Other | Attending: Cardiovascular Disease | Admitting: Cardiovascular Disease

## 2012-11-29 DIAGNOSIS — I5032 Chronic diastolic (congestive) heart failure: Secondary | ICD-10-CM

## 2012-11-29 DIAGNOSIS — I428 Other cardiomyopathies: Secondary | ICD-10-CM | POA: Diagnosis present

## 2012-11-29 DIAGNOSIS — I471 Supraventricular tachycardia, unspecified: Principal | ICD-10-CM

## 2012-11-29 DIAGNOSIS — K3184 Gastroparesis: Secondary | ICD-10-CM

## 2012-11-29 DIAGNOSIS — F1410 Cocaine abuse, uncomplicated: Secondary | ICD-10-CM | POA: Diagnosis present

## 2012-11-29 DIAGNOSIS — Z720 Tobacco use: Secondary | ICD-10-CM

## 2012-11-29 DIAGNOSIS — Z952 Presence of prosthetic heart valve: Secondary | ICD-10-CM

## 2012-11-29 DIAGNOSIS — I4892 Unspecified atrial flutter: Secondary | ICD-10-CM | POA: Diagnosis present

## 2012-11-29 DIAGNOSIS — E785 Hyperlipidemia, unspecified: Secondary | ICD-10-CM | POA: Diagnosis present

## 2012-11-29 DIAGNOSIS — F1910 Other psychoactive substance abuse, uncomplicated: Secondary | ICD-10-CM | POA: Diagnosis present

## 2012-11-29 DIAGNOSIS — D631 Anemia in chronic kidney disease: Secondary | ICD-10-CM

## 2012-11-29 DIAGNOSIS — I459 Conduction disorder, unspecified: Secondary | ICD-10-CM | POA: Diagnosis present

## 2012-11-29 DIAGNOSIS — Z7982 Long term (current) use of aspirin: Secondary | ICD-10-CM

## 2012-11-29 DIAGNOSIS — J9819 Other pulmonary collapse: Secondary | ICD-10-CM | POA: Diagnosis present

## 2012-11-29 DIAGNOSIS — N039 Chronic nephritic syndrome with unspecified morphologic changes: Secondary | ICD-10-CM | POA: Diagnosis present

## 2012-11-29 DIAGNOSIS — Z79899 Other long term (current) drug therapy: Secondary | ICD-10-CM

## 2012-11-29 DIAGNOSIS — Z954 Presence of other heart-valve replacement: Secondary | ICD-10-CM

## 2012-11-29 DIAGNOSIS — E876 Hypokalemia: Secondary | ICD-10-CM | POA: Diagnosis not present

## 2012-11-29 DIAGNOSIS — I2789 Other specified pulmonary heart diseases: Secondary | ICD-10-CM | POA: Diagnosis present

## 2012-11-29 DIAGNOSIS — F1510 Other stimulant abuse, uncomplicated: Secondary | ICD-10-CM | POA: Diagnosis present

## 2012-11-29 DIAGNOSIS — I429 Cardiomyopathy, unspecified: Secondary | ICD-10-CM

## 2012-11-29 DIAGNOSIS — R079 Chest pain, unspecified: Secondary | ICD-10-CM

## 2012-11-29 DIAGNOSIS — I10 Essential (primary) hypertension: Secondary | ICD-10-CM

## 2012-11-29 DIAGNOSIS — R112 Nausea with vomiting, unspecified: Secondary | ICD-10-CM | POA: Diagnosis present

## 2012-11-29 DIAGNOSIS — F3130 Bipolar disorder, current episode depressed, mild or moderate severity, unspecified: Secondary | ICD-10-CM | POA: Diagnosis present

## 2012-11-29 DIAGNOSIS — I499 Cardiac arrhythmia, unspecified: Secondary | ICD-10-CM

## 2012-11-29 DIAGNOSIS — F411 Generalized anxiety disorder: Secondary | ICD-10-CM | POA: Diagnosis present

## 2012-11-29 DIAGNOSIS — N184 Chronic kidney disease, stage 4 (severe): Secondary | ICD-10-CM

## 2012-11-29 DIAGNOSIS — I5033 Acute on chronic diastolic (congestive) heart failure: Secondary | ICD-10-CM

## 2012-11-29 DIAGNOSIS — R42 Dizziness and giddiness: Secondary | ICD-10-CM | POA: Diagnosis present

## 2012-11-29 DIAGNOSIS — I498 Other specified cardiac arrhythmias: Secondary | ICD-10-CM

## 2012-11-29 DIAGNOSIS — I129 Hypertensive chronic kidney disease with stage 1 through stage 4 chronic kidney disease, or unspecified chronic kidney disease: Secondary | ICD-10-CM | POA: Diagnosis present

## 2012-11-29 DIAGNOSIS — I4719 Other supraventricular tachycardia: Secondary | ICD-10-CM

## 2012-11-29 DIAGNOSIS — K219 Gastro-esophageal reflux disease without esophagitis: Secondary | ICD-10-CM | POA: Diagnosis present

## 2012-11-29 DIAGNOSIS — I509 Heart failure, unspecified: Secondary | ICD-10-CM

## 2012-11-29 DIAGNOSIS — F209 Schizophrenia, unspecified: Secondary | ICD-10-CM | POA: Diagnosis present

## 2012-11-29 DIAGNOSIS — I5043 Acute on chronic combined systolic (congestive) and diastolic (congestive) heart failure: Secondary | ICD-10-CM

## 2012-11-29 DIAGNOSIS — Z87891 Personal history of nicotine dependence: Secondary | ICD-10-CM

## 2012-11-29 DIAGNOSIS — M129 Arthropathy, unspecified: Secondary | ICD-10-CM | POA: Diagnosis present

## 2012-11-29 DIAGNOSIS — I059 Rheumatic mitral valve disease, unspecified: Secondary | ICD-10-CM

## 2012-11-29 HISTORY — DX: Endocarditis, valve unspecified: I38

## 2012-11-29 HISTORY — DX: Supraventricular tachycardia: I47.1

## 2012-11-29 HISTORY — DX: Other supraventricular tachycardia: I47.19

## 2012-11-29 LAB — CBC
HCT: 33.6 % — ABNORMAL LOW (ref 39.0–52.0)
Hemoglobin: 10.3 g/dL — ABNORMAL LOW (ref 13.0–17.0)
Hemoglobin: 9.3 g/dL — ABNORMAL LOW (ref 13.0–17.0)
MCH: 22.9 pg — ABNORMAL LOW (ref 26.0–34.0)
MCHC: 30.7 g/dL (ref 30.0–36.0)
MCHC: 30.7 g/dL (ref 30.0–36.0)
MCV: 74.8 fL — ABNORMAL LOW (ref 78.0–100.0)
WBC: 6.1 10*3/uL (ref 4.0–10.5)

## 2012-11-29 LAB — RAPID URINE DRUG SCREEN, HOSP PERFORMED
Benzodiazepines: NOT DETECTED
Cocaine: NOT DETECTED

## 2012-11-29 LAB — COMPREHENSIVE METABOLIC PANEL
Alkaline Phosphatase: 155 U/L — ABNORMAL HIGH (ref 39–117)
BUN: 26 mg/dL — ABNORMAL HIGH (ref 6–23)
Calcium: 9.7 mg/dL (ref 8.4–10.5)
GFR calc Af Amer: 34 mL/min — ABNORMAL LOW (ref >90)
Glucose, Bld: 115 mg/dL — ABNORMAL HIGH (ref 70–99)
Total Protein: 7.6 g/dL (ref 6.0–8.3)

## 2012-11-29 LAB — CREATININE, SERUM
Creatinine, Ser: 2.38 mg/dL — ABNORMAL HIGH (ref 0.50–1.35)
GFR calc non Af Amer: 30 mL/min — ABNORMAL LOW (ref >90)

## 2012-11-29 LAB — TSH: TSH: 1.251 u[IU]/mL (ref 0.350–4.500)

## 2012-11-29 LAB — TROPONIN I: Troponin I: <0.3 ng/mL (ref <0.30)

## 2012-11-29 LAB — PRO B NATRIURETIC PEPTIDE: Pro B Natriuretic peptide (BNP): 6003 pg/mL — ABNORMAL HIGH (ref 0–125)

## 2012-11-29 MED ORDER — BUDESONIDE-FORMOTEROL FUMARATE 160-4.5 MCG/ACT IN AERO
2.0000 | INHALATION_SPRAY | Freq: Two times a day (BID) | RESPIRATORY_TRACT | Status: DC
  Administered 2012-11-29 – 2012-12-04 (×8): 2 via RESPIRATORY_TRACT
  Filled 2012-11-29 (×3): qty 6

## 2012-11-29 MED ORDER — HEPARIN SODIUM (PORCINE) 5000 UNIT/ML IJ SOLN
5000.0000 [IU] | Freq: Three times a day (TID) | INTRAMUSCULAR | Status: DC
  Administered 2012-11-29 – 2012-12-01 (×7): 5000 [IU] via SUBCUTANEOUS
  Filled 2012-11-29 (×9): qty 1

## 2012-11-29 MED ORDER — POTASSIUM CHLORIDE 20 MEQ PO PACK
40.0000 meq | PACK | Freq: Once | ORAL | Status: DC
  Filled 2012-11-29: qty 2

## 2012-11-29 MED ORDER — DEXTROSE 5 % IV SOLN
60.0000 mg/h | Freq: Once | INTRAVENOUS | Status: DC
  Filled 2012-11-29: qty 9

## 2012-11-29 MED ORDER — ALBUTEROL SULFATE HFA 108 (90 BASE) MCG/ACT IN AERS
2.0000 | INHALATION_SPRAY | RESPIRATORY_TRACT | Status: DC | PRN
  Filled 2012-11-29: qty 6.7

## 2012-11-29 MED ORDER — AMIODARONE HCL IN DEXTROSE 360-4.14 MG/200ML-% IV SOLN
60.0000 mg/h | INTRAVENOUS | Status: DC
  Administered 2012-11-29: 60 mg/h via INTRAVENOUS
  Filled 2012-11-29: qty 200

## 2012-11-29 MED ORDER — HYDRALAZINE HCL 50 MG PO TABS
50.0000 mg | ORAL_TABLET | Freq: Three times a day (TID) | ORAL | Status: DC
  Administered 2012-11-29 – 2012-11-30 (×3): 50 mg via ORAL
  Filled 2012-11-29 (×6): qty 1

## 2012-11-29 MED ORDER — POTASSIUM CHLORIDE CRYS ER 20 MEQ PO TBCR
40.0000 meq | EXTENDED_RELEASE_TABLET | Freq: Once | ORAL | Status: AC
  Administered 2012-11-29: 40 meq via ORAL
  Filled 2012-11-29: qty 2

## 2012-11-29 MED ORDER — PIPERACILLIN-TAZOBACTAM 3.375 G IVPB
3.3750 g | Freq: Three times a day (TID) | INTRAVENOUS | Status: DC
  Administered 2012-11-29 – 2012-11-30 (×3): 3.375 g via INTRAVENOUS
  Filled 2012-11-29 (×6): qty 50

## 2012-11-29 MED ORDER — HYDROCODONE-ACETAMINOPHEN 5-325 MG PO TABS
1.0000 | ORAL_TABLET | ORAL | Status: DC | PRN
  Administered 2012-11-29 – 2012-12-05 (×28): 2 via ORAL
  Filled 2012-11-29 (×14): qty 2
  Filled 2012-11-29: qty 1
  Filled 2012-11-29 (×15): qty 2

## 2012-11-29 MED ORDER — SODIUM CHLORIDE 0.9 % IJ SOLN: 3.0000 mL | INTRAMUSCULAR | Status: DC | PRN

## 2012-11-29 MED ORDER — AMLODIPINE BESYLATE 5 MG PO TABS
5.0000 mg | ORAL_TABLET | Freq: Every day | ORAL | Status: DC
  Administered 2012-11-29: 5 mg via ORAL
  Filled 2012-11-29 (×2): qty 1

## 2012-11-29 MED ORDER — ONDANSETRON HCL 4 MG/2ML IJ SOLN
4.0000 mg | Freq: Four times a day (QID) | INTRAMUSCULAR | Status: DC | PRN
  Administered 2012-11-29 – 2012-12-02 (×7): 4 mg via INTRAVENOUS
  Filled 2012-11-29 (×8): qty 2

## 2012-11-29 MED ORDER — VANCOMYCIN HCL IN DEXTROSE 1-5 GM/200ML-% IV SOLN
1000.0000 mg | Freq: Once | INTRAVENOUS | Status: AC
  Administered 2012-11-29: 1000 mg via INTRAVENOUS
  Filled 2012-11-29: qty 200

## 2012-11-29 MED ORDER — VANCOMYCIN HCL IN DEXTROSE 1-5 GM/200ML-% IV SOLN
1000.0000 mg | INTRAVENOUS | Status: DC
  Administered 2012-11-30: 1000 mg via INTRAVENOUS
  Filled 2012-11-29: qty 200

## 2012-11-29 MED ORDER — HYDROMORPHONE HCL PF 1 MG/ML IJ SOLN
1.0000 mg | Freq: Once | INTRAMUSCULAR | Status: AC
  Administered 2012-11-29: 1 mg via INTRAVENOUS
  Filled 2012-11-29: qty 1

## 2012-11-29 MED ORDER — CARVEDILOL 12.5 MG PO TABS
12.5000 mg | ORAL_TABLET | Freq: Two times a day (BID) | ORAL | Status: DC
  Administered 2012-11-29 – 2012-11-30 (×3): 12.5 mg via ORAL
  Filled 2012-11-29 (×5): qty 1

## 2012-11-29 MED ORDER — NICOTINE 14 MG/24HR TD PT24
14.0000 mg | MEDICATED_PATCH | TRANSDERMAL | Status: DC
  Administered 2012-11-29 – 2012-12-05 (×6): 14 mg via TRANSDERMAL
  Filled 2012-11-29 (×7): qty 1

## 2012-11-29 MED ORDER — ISOSORBIDE MONONITRATE ER 60 MG PO TB24
60.0000 mg | ORAL_TABLET | Freq: Every day | ORAL | Status: DC
  Administered 2012-11-29 – 2012-12-05 (×7): 60 mg via ORAL
  Filled 2012-11-29 (×8): qty 1

## 2012-11-29 MED ORDER — SODIUM CHLORIDE 0.9 % IV SOLN
250.0000 mL | INTRAVENOUS | Status: DC | PRN
  Administered 2012-12-04: 500 mL via INTRAVENOUS

## 2012-11-29 MED ORDER — ARIPIPRAZOLE 5 MG PO TABS
5.0000 mg | ORAL_TABLET | Freq: Every day | ORAL | Status: DC
  Administered 2012-11-29 – 2012-12-04 (×6): 5 mg via ORAL
  Filled 2012-11-29 (×8): qty 1

## 2012-11-29 MED ORDER — SODIUM CHLORIDE 0.9 % IV SOLN
INTRAVENOUS | Status: DC
  Administered 2012-11-29: 08:00:00 via INTRAVENOUS

## 2012-11-29 MED ORDER — PIPERACILLIN-TAZOBACTAM 3.375 G IVPB
3.3750 g | Freq: Once | INTRAVENOUS | Status: AC
  Administered 2012-11-29: 3.375 g via INTRAVENOUS
  Filled 2012-11-29: qty 50

## 2012-11-29 MED ORDER — DOCUSATE SODIUM 100 MG PO CAPS
100.0000 mg | ORAL_CAPSULE | Freq: Two times a day (BID) | ORAL | Status: DC | PRN
  Administered 2012-12-03 – 2012-12-04 (×3): 100 mg via ORAL
  Filled 2012-11-29 (×3): qty 1

## 2012-11-29 MED ORDER — FUROSEMIDE 10 MG/ML IJ SOLN
40.0000 mg | Freq: Two times a day (BID) | INTRAMUSCULAR | Status: DC
  Administered 2012-11-29 – 2012-11-30 (×2): 40 mg via INTRAVENOUS
  Filled 2012-11-29 (×4): qty 4

## 2012-11-29 MED ORDER — DILTIAZEM HCL 100 MG IV SOLR: 5.0000 mg/h | INTRAVENOUS | Status: DC

## 2012-11-29 MED ORDER — ADENOSINE 6 MG/2ML IV SOLN
INTRAVENOUS | Status: AC
  Administered 2012-11-29: 18 mg
  Filled 2012-11-29: qty 8

## 2012-11-29 MED ORDER — ASPIRIN EC 81 MG PO TBEC: 81.0000 mg | DELAYED_RELEASE_TABLET | Freq: Every day | ORAL | Status: DC

## 2012-11-29 MED ORDER — NITROGLYCERIN 0.4 MG SL SUBL: 0.4000 mg | SUBLINGUAL_TABLET | SUBLINGUAL | Status: DC | PRN

## 2012-11-29 MED ORDER — METOCLOPRAMIDE HCL 10 MG PO TABS
10.0000 mg | ORAL_TABLET | Freq: Four times a day (QID) | ORAL | Status: DC
  Administered 2012-11-29 – 2012-12-05 (×24): 10 mg via ORAL
  Filled 2012-11-29 (×32): qty 1

## 2012-11-29 MED ORDER — ACETAMINOPHEN 325 MG PO TABS
650.0000 mg | ORAL_TABLET | ORAL | Status: DC | PRN
  Administered 2012-11-29 – 2012-11-30 (×2): 650 mg via ORAL
  Filled 2012-11-29 (×3): qty 2

## 2012-11-29 MED ORDER — PANTOPRAZOLE SODIUM 40 MG PO TBEC
40.0000 mg | DELAYED_RELEASE_TABLET | Freq: Every day | ORAL | Status: DC
  Administered 2012-11-29 – 2012-12-05 (×7): 40 mg via ORAL
  Filled 2012-11-29 (×6): qty 1

## 2012-11-29 MED ORDER — SODIUM CHLORIDE 0.9 % IJ SOLN
3.0000 mL | Freq: Two times a day (BID) | INTRAMUSCULAR | Status: DC
  Administered 2012-11-29: 3 mL via INTRAVENOUS
  Administered 2012-12-01: 10:00:00 via INTRAVENOUS
  Administered 2012-12-01: 3 mL via INTRAVENOUS
  Administered 2012-12-02: 09:00:00 via INTRAVENOUS
  Administered 2012-12-02 – 2012-12-05 (×5): 3 mL via INTRAVENOUS

## 2012-11-29 MED ORDER — ONDANSETRON HCL 4 MG/2ML IJ SOLN
4.0000 mg | Freq: Once | INTRAMUSCULAR | Status: AC
  Administered 2012-11-29: 4 mg via INTRAVENOUS
  Filled 2012-11-29: qty 2

## 2012-11-29 MED ORDER — PROMETHAZINE HCL 25 MG/ML IJ SOLN
25.0000 mg | Freq: Once | INTRAMUSCULAR | Status: AC
  Administered 2012-11-29: 25 mg via INTRAMUSCULAR
  Filled 2012-11-29: qty 1

## 2012-11-29 MED ORDER — ASPIRIN 81 MG PO CHEW
81.0000 mg | CHEWABLE_TABLET | Freq: Every day | ORAL | Status: DC
  Administered 2012-11-29 – 2012-12-05 (×7): 81 mg via ORAL
  Filled 2012-11-29 (×8): qty 1

## 2012-11-29 NOTE — Progress Notes (Signed)
ANTIBIOTIC CONSULT NOTE - INITIAL  Pharmacy Consult for vanc/Zosyn Indication: rule out pneumonia  Allergies  Allergen Reactions  . Cashew Nut Oil Anaphylaxis  . Food Anaphylaxis    Strawberries, Cashews, causes throat to swell - affects breathing    Patient Measurements: Height: 5\' 6"  (167.6 cm) Weight: 154 lb 1.6 oz (69.899 kg) IBW/kg (Calculated) : 63.8   Vital Signs: Temp: 98.6 F (37 C) (01/11 1212) Temp src: Oral (01/11 1212) BP: 173/119 mmHg (01/11 1100) Pulse Rate: 97  (01/11 1100)  Labs:  Basename 11/29/12 0620 11/28/12 1027 11/27/12 0240  WBC 6.1 5.0 4.8  HGB 10.3* 10.2* 9.6*  PLT 167 182 161  LABCREA -- -- --  CREATININE 2.39* 2.60* 2.45*   Estimated Creatinine Clearance: 33 ml/min (by C-G formula based on Cr of 2.39).  Medical History: Past Medical History  Diagnosis Date  . Hypertension   . Bipolar disorder   . Schizophrenia     h/o hospitalized inpt therapy @ the [facility]  . 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   Marland Kitchen Hyperlipidemia   . H/O: suicide attempt 1998    was hospitalized at [facility]  . Anemia     Hx of iron infusions  . Chronic diastolic heart failure     Hx of Class IV CHF  . Gastroparesis   . Generalized headaches     sinus  . Arthritis   . Cocaine abuse   . Anxiety   . CHF (congestive heart failure)   . GERD (gastroesophageal reflux disease)   . Heart murmur   . Shortness of breath     with ambulation and sometimes sitting  . Depression   . Vertigo   . Constipation   . Itchy skin   . Chronic kidney disease     has fistula, not on HD    Assessment: 11 YOM with extensive cardiac history as well as schizophrenia and CKD transferred to [facility] from [facility] with abdominal pain. CXR did show a RLL consolidation suspicious for PNA. To start broad spectrum  antibiotics due to recent hospitalizations. WBC 6.1, afebrile. SCr 2.39 with CrCl ~81mL/min. Of note, patient received vancomycin 1g IV x1 and Zosyn 3.375gm IV x1 at ~0800 in [facility].  Goal of Therapy:  Vancomycin trough level 15-20 mcg/ml  Plan:  1. Vancomycin 1g IV Q24 to start at 0800 on 1/12 2. Zosyn 3.375gm IV Q8hr EI dosing 3. Vancomycin trough at steady state 4. Follow up fever curve, WBC, LOT, and clinical progression  Wei Poplaski D. Elese Rane, PharmD Clinical Pharmacist Pager: [phone] 11/29/2012 12:59 PM

## 2012-11-29 NOTE — ED Provider Notes (Signed)
History     CSN: [id number]  Arrival date & time 11/29/12  0417   First MD Initiated Contact with Patient 11/29/12 0500      Chief Complaint  Patient presents with  . Abdominal Pain    (Consider location/radiation/quality/duration/timing/severity/associated sxs/prior treatment) HPI History per patient. Was evaluated here last night for the same symptoms, has history of gastroparesis and CHF prior history of polysubstance abuse. He has had persistent nausea and vomiting with abdominal pain. He has sharp chest pain is substernal and radiates to his left arm. No diaphoresis or shortness of breath. No leg swelling. No fevers. No cough. Symptoms moderate to severe and not relieved by home medications. Tonight he took hydrocodone and Phenergan with persistent vomiting and pain. He received intermittent relief of symptoms with IV Dilaudid in the emergency department last night. Past Medical History  Diagnosis Date  . Hypertension   . Bipolar disorder   . Schizophrenia     h/o hospitalized inpt therapy @ the [facility]  . 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   Marland Kitchen Hyperlipidemia   . H/O: suicide attempt 1998    was hospitalized at [facility]  . Anemia     Hx of iron infusions  . Chronic diastolic heart failure     Hx of Class IV CHF  . Gastroparesis   . Generalized headaches     sinus  . Arthritis   . Cocaine abuse   . Anxiety   . CHF (congestive heart failure)   . GERD (gastroesophageal reflux disease)   . Heart murmur   . Shortness of breath     with ambulation and sometimes sitting  . Depression   . Vertigo   . Constipation   . Itchy skin   . Chronic kidney disease     has fistula, not on HD    Past Surgical History  Procedure Date  . Mitral valve repair 05/2010  . Av fistula placement, radiocephalic  05/24/2011    Left arm  . Leg surgery   . Umbilical hernia repair 04/01/2012    Procedure: HERNIA REPAIR UMBILICAL ADULT;  Surgeon: Ardeth Sportsman, MD;  Location: [facility] OR;  Service: General;  Laterality: N/A;  supra umbilical  . Tee without cardioversion 08/25/2012    Procedure: TRANSESOPHAGEAL ECHOCARDIOGRAM (TEE);  Surgeon: Wendall Stade, MD;  Location: [facility] ENDOSCOPY;  Service: Cardiovascular;  Laterality: N/A;  . Mitral valve replacement 09/23/2012    Procedure: REDO MITRAL VALVE REPLACEMENT (MVR);  Surgeon: [name], MD;  Location: [facility] OR;  Service: Open Heart Surgery;  Laterality: N/A;  Nitric Oxide  . Tricuspid valve replacement 09/23/2012    Procedure: TRICUSPID VALVE REPAIR;  Surgeon: [name], MD;  Location: [facility] OR;  Service: Open Heart Surgery;  Laterality: N/A;    Family History  Problem Relation Age of Onset  . Hypertension Mother     History  Substance Use Topics  . Smoking status: Former Smoker -- 1.5 packs/day for 30 years    Types: Cigarettes    Quit date: 03/21/2011  . Smokeless tobacco: Never Used  . Alcohol Use: No     Comment: Quit alcohol in July of 2011      Review of Systems  Constitutional: Negative for fever and chills.  HENT: Negative for neck pain and neck stiffness.  Eyes: Negative for pain.  Respiratory: Negative for shortness of breath.   Cardiovascular: Positive for chest pain.  Gastrointestinal: Positive for nausea, vomiting and abdominal pain.  Genitourinary: Negative for dysuria.  Musculoskeletal: Negative for back pain.  Skin: Negative for rash.  Neurological: Negative for headaches.  All other systems reviewed and are negative.    Allergies  Cashew nut oil and Food  Home Medications   Current Outpatient Rx  Name  Route  Sig  Dispense  Refill  . ALBUTEROL SULFATE HFA 108 (90 BASE) MCG/ACT IN AERS   Inhalation   Inhale 2 puffs into the lungs every 4 (four) hours as needed. For shortness of breath or wheezing         .  ARIPIPRAZOLE 5 MG PO TABS   Oral   Take 5 mg by mouth daily.           . ASPIRIN 81 MG PO TBEC   Oral   Take 81 mg by mouth daily.           . BUDESONIDE-FORMOTEROL FUMARATE 160-4.5 MCG/ACT IN AERO   Inhalation   Inhale 2 puffs into the lungs 2 (two) times daily.   1 Inhaler   1   . CARVEDILOL 6.25 MG PO TABS   Oral   Take 1 tablet (6.25 mg total) by mouth 2 (two) times daily with a meal.   60 tablet   1   . DOCUSATE SODIUM 100 MG PO CAPS   Oral   Take 100 mg by mouth 2 (two) times daily as needed. For constipation         . FUROSEMIDE 40 MG PO TABS   Oral   Take 1 tablet (40 mg total) by mouth daily.   30 tablet   6   . HYDROCODONE-ACETAMINOPHEN 7.5-325 MG PO TABS   Oral   Take 1 tablet by mouth every 6 (six) hours as needed for pain.   17 tablet   0   . HYDROXYZINE PAMOATE 50 MG PO CAPS   Oral   Take 50 mg by mouth at bedtime as needed. For sleep         . METOCLOPRAMIDE HCL 10 MG PO TABS   Oral   Take 10 mg by mouth 4 (four) times daily.         Marland Kitchen NICOTINE 14 MG/24HR TD PT24   Transdermal   Place 1 patch onto the skin daily.   28 patch   0   . OMEPRAZOLE 40 MG PO CPDR   Oral   Take 40 mg by mouth 2 (two) times daily as needed. For heart burn         . PROMETHAZINE HCL 25 MG RE SUPP   Rectal   Place 1 suppository (25 mg total) rectally every 6 (six) hours as needed for nausea.   12 each   0   . SILDENAFIL CITRATE 20 MG PO TABS   Oral   Take 20 mg by mouth 2 (two) times daily.         Marland Kitchen PROMETHAZINE HCL 25 MG RE SUPP   Rectal   Place 1 suppository (25 mg total) rectally every 6 (six) hours as needed for nausea.   12 each   0     BP 160/89  Pulse 98  Temp 98 F (36.7 C) (Oral)  Resp 16  SpO2 99%  Physical Exam  Constitutional: He is oriented to person, place, and time. He appears well-developed and  well-nourished.  HENT:  Head: Normocephalic and atraumatic.  Eyes: Conjunctivae normal and EOM are normal. Pupils are equal,  round, and reactive to light.  Neck: Trachea normal. Neck supple. No thyromegaly present.  Cardiovascular: Normal rate, regular rhythm, S1 normal, S2 normal and normal pulses.     No systolic murmur is present   No diastolic murmur is present  Pulses:      Radial pulses are 2+ on the right side, and 2+ on the left side.  Pulmonary/Chest: Effort normal and breath sounds normal. He has no wheezes. He has no rhonchi. He has no rales. He exhibits no tenderness.  Abdominal: Soft. Normal appearance and bowel sounds are normal. There is no CVA tenderness and negative Murphy's sign.       Mild mid abdominal wall tenderness with small area of fullness, no reducible hernia. No tenderness otherwise  Musculoskeletal:       BLE:s Calves nontender, no cords or erythema, negative Homans sign  Neurological: He is alert and oriented to person, place, and time. He has normal strength. No cranial nerve deficit or sensory deficit. GCS eye subscore is 4. GCS verbal subscore is 5. GCS motor subscore is 6.  Skin: Skin is warm and dry. No rash noted. He is not diaphoretic.  Psychiatric: His speech is normal.       Cooperative and appropriate    ED Course  CARDIOVERSION Date/Time: 11/29/2012 6:55 AM Performed by: Sunnie Nielsen Authorized by: Sunnie Nielsen Consent: Verbal consent obtained. Risks and benefits: risks, benefits and alternatives were discussed Consent given by: patient Patient understanding: patient states understanding of the procedure being performed Patient consent: the patient's understanding of the procedure matches consent given Procedure consent: procedure consent matches procedure scheduled Required items: required blood products, implants, devices, and special equipment available Patient identity confirmed: verbally with patient Time out: Immediately prior to procedure a "time out" was called to verify the correct patient, procedure, equipment, support staff and site/side marked as  required. Cardioversion basis: emergent Pre-procedure rhythm: narrow complex rate 200. Patient position: patient was placed in a supine position Post-procedure rhythm: atrial fibrillation Complications: no complications Patient tolerance: Patient tolerated the procedure well with no immediate complications. Comments: CHEMICAL CARDIOVERSION ATTEMPTED - ADENOSINE 6mg  NO CHANGE, REPEATED WITH 12mg  and brief CARDIOVERSION TO RATE 90s then back to 200.   (including critical care time)   Results for orders placed during the hospital encounter of 11/29/12  CBC      Component Value Range   WBC 6.1  4.0 - 10.5 K/uL   RBC 4.49  4.22 - 5.81 MIL/uL   Hemoglobin 10.3 (*) 13.0 - 17.0 g/dL   HCT 16.1 (*) 09.6 - 04.5 %   MCV 74.8 (*) 78.0 - 100.0 fL   MCH 22.9 (*) 26.0 - 34.0 pg   MCHC 30.7  30.0 - 36.0 g/dL   RDW 40.9 (*) 81.1 - 91.4 %   Platelets 167  150 - 400 K/uL   Ct Abdomen Pelvis Wo Contrast  11/29/2012  *RADIOLOGY REPORT*  Clinical Data: Abdominal pain.  CT ABDOMEN AND PELVIS WITHOUT CONTRAST  Technique:  Multidetector CT imaging of the abdomen and pelvis was performed following the standard protocol without intravenous contrast.  Comparison: 01/03/2012  Findings: Small right pleural effusion.  Consolidation in the right lower lobe with elevation of the right hemidiaphragm.  Heart is mildly enlarged.  Liver, gallbladder, spleen, pancreas, adrenals and kidneys have an unremarkable unenhanced appearance.  Appendix is visualized and is normal.  Small amount of free fluid in the pelvis.  Small bowel is decompressed.  Urinary bladder is nondistended.  No free air or adenopathy.  Aorta is normal caliber.  Stomach and large bowel grossly unremarkable.  IMPRESSION: Small right pleural effusion.  Dense consolidation with air bronchograms in the right lower lobe.  Cannot exclude pneumonia. Elevation of the right hemidiaphragm.  Small amount of free fluid in the pelvis.   Original Report Authenticated By:  Charlett Nose, M.D.    Dg Chest 2 View  11/28/2012  *RADIOLOGY REPORT*  Clinical Data: Chest pain, abdominal pain, vomiting, and hx of HTN.  CHEST - 2 VIEW  Comparison: 11/27/2012  Findings: Prosthetic mitral and tricuspid valves noted with stable blunting of the right lateral costophrenic angle and stable opacity at the right lung base.  Cardiomegaly is present.  Faint interstitial accentuation noted with mild prominence of upper zone pulmonary vasculature.  IMPRESSION:  1.  Similar appearance of small to moderate right pleural effusion and cardiomegaly compared to yesterday, with atelectasis at the right lung base. 2.  Faint interstitial accentuation, but without overt edema.   Original Report Authenticated By: Gaylyn Rong, M.D.      Date: 11/29/2012  Rate: 100  Rhythm: sinus tachycardia  QRS Axis: normal  Intervals: normal  ST/T Wave abnormalities: nonspecific ST/T changes  Conduction Disutrbances:none  Narrative Interpretation:   Old EKG Reviewed: unchanged  IV Dilaudid. IM Phenergan.   CRITICAL CARE Performed by: Sunnie Nielsen   Total critical care time: 30  Critical care time was exclusive of separately billable procedures and treating other patients.  Critical care was necessary to treat or prevent imminent or life-threatening deterioration.  Critical care was time spent personally by me on the following activities: development of treatment plan with patient and/or surrogate as well as nursing, discussions with consultants, evaluation of patient's response to treatment, examination of patient, obtaining history from patient or surrogate, ordering and performing treatments and interventions, ordering and review of laboratory studies, ordering and review of radiographic studies, pulse oximetry and re-evaluation of patient's condition.  PT developed narrow complex tachycardia to rate 200, was given adenosine 6mg , then 12mg  and briefly broke with repeat ECG unchanged as above ?  [place]  7:17 AM d/w Cardiology on call, DR Diona Browner, recs Amio and when rate controlled admit to step down ICU at [facility]   On recheck 7:18 AM HR 90s, plan transfer/ admit MDM   CP h/o CHF. Narrow complex tachycardia. IV Amio. CAR consult plan admit [place] step down         Sunnie Nielsen, MD 11/29/12 [phone]

## 2012-11-29 NOTE — ED Notes (Signed)
Patient reports having taken Hydrocodone for pain "about 4 hours ago", and taking a phenergan PR "about 2.5 hours ago"

## 2012-11-29 NOTE — ED Notes (Signed)
Patient in SVT. On 12 lead cardiac monitor. Patient given 6mg  Adenosine, followed by 12mg  Adenosine. Patient heart rate temporarily decreased to 80 on monitor before climbing back up to 198bmp. Medications administered with Dr Dierdre Highman at bedside.

## 2012-11-29 NOTE — ED Notes (Signed)
Patient is not actively vomiting. Patient is making loud noises that sound like vomiting,but patient walked to restroom accompanied by a family member and did not make any gagging, wretching sounds.

## 2012-11-29 NOTE — ED Notes (Signed)
Report given to Melody, RN on 2900.

## 2012-11-29 NOTE — H&P (Signed)
Physician History and Physical    Ryan Winters MRN: [id number] DOB/AGE: [birth year] 52 y.o. Admit date: 11/29/2012  Primary Cardiologist: Dr. Excell Seltzer  HPI:  52 yo with history of MR s/p MV repair and then MV replacement/TV repair in 11/13 as well as CKD and poorly controlled HTN presented to [facility] with abdominal pain.  He has had this for about 2-3 days.  He has a history of gastroparesis but this is worse pain than in the past.  Pain was peri-umbilical and associated with nausea/vomiting.  No coughing/sputum.  He has baseline exertional dyspnea after walking 50-100 feet that has not worsened.  In the ER, CT abdomen showed no intra-abdominal pathology but there did appear to be a RLL PNA.  In the ER, he went into a narrow complex regular tachycardia with rate around 200.  There was a long R-P interval on the ECG.  The tachycardia eventually broke, it is unclear whether adenosine worked or not.  He was started on amiodarone and is now in NSR.  BP has been quite high in the hospital.  BP has been difficult to control in the past.  Creatinine appears to be near his baseline.  He appears to have gotten vancomycin/Zosyn already (prior to blood cultures).   PMH: 1. Mitral regurgitation: MV repair in 2011 then redeveloped severe MR.  Had bioprosthetic MV replacement with tricuspid valve repair in 11/13.  Echo preserved pre-operatively.  2. Diastolic CHF: Has not had echo since valve surgery.  3. HTN 4. CKD: Has AV fistula.  Creatinine baseline appears to range 1.9-2.5. 5. Schizophrenia 6. H/o cocaine abuse: denies x 6 months.  7. GERD 8. LHC (10/13) with mild CAD.  9. Gastroparesis  Review of systems complete and found to be negative unless listed above   Family History  Problem Relation Age of Onset  . Hypertension Mother     History   Social History  . Marital Status: Married    Spouse Name: N/A    Number of Children: N/A  . Years of Education: N/A   Occupational History  .  disabled    Social History Main Topics  . Smoking status: Former Smoker -- 1.5 packs/day for 30 years    Types: Cigarettes    Quit date: 03/21/2011  . Smokeless tobacco: Never Used  . Alcohol Use: No     Comment: Quit alcohol in July of 2011  . Drug Use: No     Reports no cocaine x 6 months.  UDS positive for amphetamines.   . Sexually Active: No   Other Topics Concern  . Not on file   Social History Narrative   10/22/2013He lives with mother and is separated from wife. Daughter in [place] with 2 grandchildren. He denies any cigs, drugs, alcohol at present.      Prescriptions prior to admission  Medication Sig Dispense Refill  . albuterol (PROVENTIL HFA;VENTOLIN HFA) 108 (90 BASE) MCG/ACT inhaler Inhale 2 puffs into the lungs every 4 (four) hours as needed. For shortness of breath or wheezing      . ARIPiprazole (ABILIFY) 5 MG tablet Take 5 mg by mouth daily.        Marland Kitchen aspirin 81 MG EC tablet Take 81 mg by mouth daily.        . budesonide-formoterol (SYMBICORT) 160-4.5 MCG/ACT inhaler Inhale 2 puffs into the lungs 2 (two) times daily.  1 Inhaler  1  . carvedilol (COREG) 6.25 MG tablet Take 1 tablet (6.25 mg total) by  mouth 2 (two) times daily with a meal.  60 tablet  1  . docusate sodium (COLACE) 100 MG capsule Take 100 mg by mouth 2 (two) times daily as needed. For constipation      . metoCLOPramide (REGLAN) 10 MG tablet Take 10 mg by mouth 4 (four) times daily.      . nicotine (NICODERM CQ) 14 mg/24hr patch Place 1 patch onto the skin daily.  28 patch  0  . omeprazole (PRILOSEC) 40 MG capsule Take 40 mg by mouth 2 (two) times daily as needed. For heart burn      . [DISCONTINUED] furosemide (LASIX) 40 MG tablet Take 1 tablet (40 mg total) by mouth daily.  30 tablet  6  . [DISCONTINUED] HYDROcodone-acetaminophen (NORCO) 7.5-325 MG per tablet Take 1 tablet by mouth every 6 (six) hours as needed for pain.  17 tablet  0  . [DISCONTINUED] hydrOXYzine (VISTARIL) 50 MG capsule Take 50 mg by  mouth at bedtime as needed. For sleep      . [DISCONTINUED] promethazine (PHENERGAN) 25 MG suppository Place 1 suppository (25 mg total) rectally every 6 (six) hours as needed for nausea.  12 each  0  . [DISCONTINUED] sildenafil (REVATIO) 20 MG tablet Take 20 mg by mouth 2 (two) times daily.      . promethazine (PHENERGAN) 25 MG suppository Place 1 suppository (25 mg total) rectally every 6 (six) hours as needed for nausea.  12 each  0    Physical Exam: Blood pressure 173/119, pulse 97, temperature 97.6 F (36.4 C), temperature source Oral, resp. rate 19, height 5\' 6"  (1.676 m), weight 154 lb 1.6 oz (69.899 kg), SpO2 100.00%.  General: NAD Neck: JVP 9-10 cm, no thyromegaly or thyroid nodule.  Lungs: Decreased breath sounds right base.  CV: Nondisplaced PMI.  Heart regular S1/S2, +S4, no murmur.  No peripheral edema.  No carotid bruit.  Normal pedal pulses.  Abdomen: Tenderness peri-umbilical area.  No rebound/guarding.   Skin: Intact without lesions or rashes.  Neurologic: Alert and oriented x 3.  Psych: Normal affect. Extremities: No clubbing or cyanosis.  HEENT: Normal.   Labs:   Lab Results  Component Value Date   WBC 6.1 11/29/2012   HGB 10.3* 11/29/2012   HCT 33.6* 11/29/2012   MCV 74.8* 11/29/2012   PLT 167 11/29/2012    Lab 11/29/12 0620  NA 134*  K 3.8  CL 100  CO2 23  BUN 26*  CREATININE 2.39*  CALCIUM 9.7  PROT 7.6  BILITOT 0.4  ALKPHOS 155*  ALT 12  AST 14  GLUCOSE 115*   Lab Results  Component Value Date   CKTOTAL 104 05/10/2012   CKMB 2.9 05/10/2012   TROPONINI <0.30 11/29/2012    Lab Results  Component Value Date   CHOL  Value: 134        ATP III CLASSIFICATION:  <200     mg/dL   Desirable  811-914  mg/dL   Borderline High  >=782    mg/dL   High        07/24/6212   Lab Results  Component Value Date   HDL 63 05/25/2010   Lab Results  Component Value Date   LDLCALC  Value: 63        Total Cholesterol/HDL:CHD Risk Coronary Heart Disease Risk Table                      Men   Women  1/2 Average Risk  3.4   3.3  Average Risk       5.0   4.4  2 X Average Risk   9.6   7.1  3 X Average Risk  23.4   11.0        Use the calculated Patient Ratio above and the CHD Risk Table to determine the patient's CHD Risk.        ATP III CLASSIFICATION (LDL):  <100     mg/dL   Optimal  161-096  mg/dL   Near or Above                    Optimal  130-159  mg/dL   Borderline  045-409  mg/dL   High  >811     mg/dL   Very High 07/20/4781   Lab Results  Component Value Date   TRIG 39 05/25/2010   Lab Results  Component Value Date   CHOLHDL 2.1 05/25/2010   No results found for this basename: LDLDIRECT      Radiology: - CXR: Right pleural effusion (mild-moderate) with right base atelectasis - CT abdomen: No significant abdominal pathology.  Small right effusion.  Dense consolidation with air bronchograms right base concerning for PNA.  EKG: Baseline appears to be an ectopic atrial rhythm with HR in 70s.  ECG in SVT shows narrow complex, regular rhythm with long R-P.   ASSESSMENT AND PLAN:   1. Rhythm: SVT in the ER that apparently was difficult to break.  The tachycardia did not break immediately with adenosine and is now on amiodarone gtt.  Review of ECGs shows that his baseline ECG is probably an ectopic atrial rhythm.  The SVT rate was about 200 and regular.  It was a long R-P tachycardia.  I suspect that this was an atrial tachycardia.  Patient denies cocaine use.  There were amphetamines on his most recent urine drug screen.      - Repeat UDS today.  Needs to stay off amphetamines (will make sure he is not also positive for cocaine now as he used in the past).  - Increase Coreg to 12.5 mg bid.  He says he has not used cocaine x 6 months and last UDS was negative for this.  - Will stop amiodarone for now and see if increased Coreg will keep him out of SVT.  2. HTN: Poorly controlled.  As above, increase Coreg to 12.5 mg bid.  It sounds like he was on hydralazine at home though  it was not on his med list.  I will give him 50 mg po every 8 hours along with Imdur.  I will also put him on amlodipine 5 mg daily.   3. Possible PNA: RLL consolidation with air bronchograms on CT abdomen.  ? If this is actually the source of his abdominal pain.  He has apparently received vanco/Zosyn in the ER.  Unfortunately, has had no blood cultures. No fever and WBCs ok.  - Draw blood cultures and procalcitonin.  - Will continue vanco-zosyn for HCAP coverage (hospitalized in 11/13 and several ER visits since).  4. Abdominal pain: No acute pathology on CT.  ? Gastroparesis versus referred pain from RLL PNA.  5. CHF: Acute on chronic diastolic.  He is volume overloaded on exam.  Lasix 40 mg IV bid and monitor UOP.  I will get an echo as he has not had one post-MV replacement.  6. CKD: Creatinine seems to be within his usual range.   Signed: Freida Busman  Valor Quaintance 11/29/2012, 11:32 AM

## 2012-11-29 NOTE — ED Notes (Signed)
Pt alert, arrives from home, c/o abd pain, seen in ED this evening, dx with gastroparesis, pt resp even unlabored, skin pwd

## 2012-11-29 NOTE — ED Notes (Signed)
[facility] has been paged.

## 2012-11-29 NOTE — ED Notes (Signed)
Patient states he went home and took his home meds, the new meds prescribed in the ER yesterday and is unable to keep "anything" down. Patient c/o pain 10/10 in abd, unable to localize.

## 2012-11-29 NOTE — ED Notes (Signed)
Patient heart rate noted to be 200 when IV started. Patient c/o pain 10/10 in abd. Repeat EKG preformed. Patient given 1mg  IV Dilaudid and heartrate is now trending down. Patient reports a Hx of MVP, and an abnormal cardiac rhythm that is unknown by name to the patient. Patient reports there had been prior discussion of inputting a pacemaker during a prior cardiac surgery. Patient HR at this time 133.

## 2012-11-30 LAB — BASIC METABOLIC PANEL
CO2: 22 mEq/L (ref 19–32)
Calcium: 9.5 mg/dL (ref 8.4–10.5)
Creatinine, Ser: 2.65 mg/dL — ABNORMAL HIGH (ref 0.50–1.35)
Glucose, Bld: 130 mg/dL — ABNORMAL HIGH (ref 70–99)

## 2012-11-30 MED ORDER — CARVEDILOL 6.25 MG PO TABS
6.2500 mg | ORAL_TABLET | Freq: Once | ORAL | Status: AC
  Administered 2012-11-30: 6.25 mg via ORAL
  Filled 2012-11-30: qty 1

## 2012-11-30 MED ORDER — ONDANSETRON HCL 4 MG/2ML IJ SOLN
4.0000 mg | Freq: Once | INTRAMUSCULAR | Status: AC
  Administered 2012-11-30: 4 mg via INTRAVENOUS

## 2012-11-30 MED ORDER — ZOLPIDEM TARTRATE 5 MG PO TABS
5.0000 mg | ORAL_TABLET | Freq: Every evening | ORAL | Status: DC | PRN
  Administered 2012-11-30 – 2012-12-02 (×2): 5 mg via ORAL
  Filled 2012-11-30 (×2): qty 1

## 2012-11-30 MED ORDER — METOCLOPRAMIDE HCL 5 MG/ML IJ SOLN
10.0000 mg | Freq: Once | INTRAMUSCULAR | Status: AC
  Administered 2012-11-30: 10 mg via INTRAVENOUS
  Filled 2012-11-30: qty 2

## 2012-11-30 MED ORDER — FUROSEMIDE 40 MG PO TABS
40.0000 mg | ORAL_TABLET | Freq: Two times a day (BID) | ORAL | Status: DC
  Administered 2012-11-30 – 2012-12-03 (×6): 40 mg via ORAL
  Filled 2012-11-30 (×10): qty 1

## 2012-11-30 MED ORDER — HYDRALAZINE HCL 50 MG PO TABS
75.0000 mg | ORAL_TABLET | Freq: Three times a day (TID) | ORAL | Status: DC
  Administered 2012-11-30 – 2012-12-05 (×13): 75 mg via ORAL
  Filled 2012-11-30 (×22): qty 1

## 2012-11-30 MED ORDER — CARVEDILOL 6.25 MG PO TABS
18.7500 mg | ORAL_TABLET | Freq: Two times a day (BID) | ORAL | Status: DC
  Administered 2012-11-30 – 2012-12-01 (×2): 18.75 mg via ORAL
  Filled 2012-11-30 (×4): qty 1

## 2012-11-30 MED ORDER — ADENOSINE 6 MG/2ML IV SOLN
INTRAVENOUS | Status: AC
  Administered 2012-11-30: 09:00:00
  Filled 2012-11-30: qty 6

## 2012-11-30 MED ORDER — AMLODIPINE BESYLATE 10 MG PO TABS
10.0000 mg | ORAL_TABLET | Freq: Every day | ORAL | Status: DC
  Administered 2012-11-30 – 2012-12-05 (×6): 10 mg via ORAL
  Filled 2012-11-30 (×6): qty 1

## 2012-11-30 MED ORDER — METOPROLOL TARTRATE 1 MG/ML IV SOLN
INTRAVENOUS | Status: AC
  Administered 2012-11-30: 11:00:00
  Filled 2012-11-30: qty 5

## 2012-11-30 MED ORDER — HYDROCODONE-ACETAMINOPHEN 5-325 MG PO TABS: 1.0000 | ORAL_TABLET | Freq: Once | ORAL | Status: AC

## 2012-11-30 NOTE — Progress Notes (Signed)
Patient ID: Ryan Winters, male   DOB: 1961-04-17, 52 y.o.   MRN: [id number]    SUBJECTIVE: BP still remains elevated.  Patient had 2 further episodes of SVT to around 200, once last night and once again this morning.  This morning, I gave him adenosine 6 mg IV x 1 without immediate response.  I then gave him metoprolol 5 mg IV x 1.  HR gradually decreased to around 100.  ECG was done.  This appeared to be an ectopic atrial rhythm with 2:1 block.  Suspect atrial tachycardia as cause of SVT.  Patient diuresed well yesterday but creatinine rose.     PMH:  1. Mitral regurgitation: MV repair in 2011 then redeveloped severe MR. Had bioprosthetic MV replacement with tricuspid valve repair in 11/13. Echo preserved pre-operatively.  2. Diastolic CHF: Has not had echo since valve surgery.  3. HTN  4. CKD: Has AV fistula. Creatinine baseline appears to range 1.9-2.5.  5. Schizophrenia  6. H/o cocaine abuse: denies x 6 months.  7. GERD  8. LHC (10/13) with mild CAD.  9. Gastroparesis     . adenosine      . amLODipine  10 mg Oral Daily  . ARIPiprazole  5 mg Oral Daily  . aspirin  81 mg Oral Daily  . budesonide-formoterol  2 puff Inhalation BID  . carvedilol  18.75 mg Oral BID WC  . carvedilol  6.25 mg Oral Once  . furosemide  40 mg Oral BID  . heparin  5,000 Units Subcutaneous Q8H  . hydrALAZINE  75 mg Oral Q8H  . HYDROcodone-acetaminophen  1 tablet Oral Once  . isosorbide mononitrate  60 mg Oral Daily  . metoCLOPramide  10 mg Oral QID  . metoprolol      . nicotine  14 mg Transdermal Q24H  . pantoprazole  40 mg Oral Daily  . sodium chloride  3 mL Intravenous Q12H      Filed Vitals:   11/30/12 0327 11/30/12 0459 11/30/12 0500 11/30/12 0811  BP: 168/109 165/112    Pulse: 100     Temp: 98.2 F (36.8 C)   98.3 F (36.8 C)  TempSrc: Oral   Oral  Resp: 20   18  Height:      Weight:   146 lb 6.2 oz (66.4 kg)   SpO2: 98%   100%    Intake/Output Summary (Last 24 hours) at 11/30/12  0905 Last data filed at 11/30/12 0600  Gross per 24 hour  Intake 290.97 ml  Output   2150 ml  Net -1859.03 ml    LABS: Basic Metabolic Panel:  Basename 11/30/12 0515 11/29/12 1155 11/29/12 0620  NA 135 -- 134*  K 3.7 -- 3.8  CL 97 -- 100  CO2 22 -- 23  GLUCOSE 130* -- 115*  BUN 27* -- 26*  CREATININE 2.65* 2.38* --  CALCIUM 9.5 -- 9.7  MG -- -- --  PHOS -- -- --   Liver Function Tests:  [facility] 11/29/12 0620  AST 14  ALT 12  ALKPHOS 155*  BILITOT 0.4  PROT 7.6  ALBUMIN 3.6    Basename 11/28/12 1027  LIPASE 30  AMYLASE --   CBC:  Basename 11/29/12 1155 11/29/12 0620  WBC 6.1 6.1  NEUTROABS -- --  HGB 9.3* 10.3*  HCT 30.3* 33.6*  MCV 74.8* 74.8*  PLT 177 167   Cardiac Enzymes:  Basename 11/29/12 1154 11/29/12 0620  CKTOTAL -- --  CKMB -- --  CKMBINDEX -- --  TROPONINI <0.30 <0.30   BNP: No components found with this basename: POCBNP:3 D-Dimer: No results found for this basename: DDIMER:2 in the last 72 hours Hemoglobin A1C: No results found for this basename: HGBA1C in the last 72 hours Fasting Lipid Panel: No results found for this basename: CHOL,HDL,LDLCALC,TRIG,CHOLHDL,LDLDIRECT in the last 72 hours Thyroid Function Tests:  Basename 11/29/12 1155  TSH 1.251  T4TOTAL --  T3FREE --  THYROIDAB --   Anemia Panel: No results found for this basename: VITAMINB12,FOLATE,FERRITIN,TIBC,IRON,RETICCTPCT in the last 72 hours  RADIOLOGY: Ct Abdomen Pelvis Wo Contrast  11/29/2012  *RADIOLOGY REPORT*  Clinical Data: Abdominal pain.  CT ABDOMEN AND PELVIS WITHOUT CONTRAST  Technique:  Multidetector CT imaging of the abdomen and pelvis was performed following the standard protocol without intravenous contrast.  Comparison: 01/03/2012  Findings: Small right pleural effusion.  Consolidation in the right lower lobe with elevation of the right hemidiaphragm.  Heart is mildly enlarged.  Liver, gallbladder, spleen, pancreas, adrenals and kidneys have an  unremarkable unenhanced appearance.  Appendix is visualized and is normal.  Small amount of free fluid in the pelvis.  Small bowel is decompressed.  Urinary bladder is nondistended.  No free air or adenopathy.  Aorta is normal caliber.  Stomach and large bowel grossly unremarkable.  IMPRESSION: Small right pleural effusion.  Dense consolidation with air bronchograms in the right lower lobe.  Cannot exclude pneumonia. Elevation of the right hemidiaphragm.  Small amount of free fluid in the pelvis.   Original Report Authenticated By: Charlett Nose, M.D.    Dg Chest 2 View  11/28/2012  *RADIOLOGY REPORT*  Clinical Data: Chest pain, abdominal pain, vomiting, and hx of HTN.  CHEST - 2 VIEW  Comparison: 11/27/2012  Findings: Prosthetic mitral and tricuspid valves noted with stable blunting of the right lateral costophrenic angle and stable opacity at the right lung base.  Cardiomegaly is present.  Faint interstitial accentuation noted with mild prominence of upper zone pulmonary vasculature.  IMPRESSION:  1.  Similar appearance of small to moderate right pleural effusion and cardiomegaly compared to yesterday, with atelectasis at the right lung base. 2.  Faint interstitial accentuation, but without overt edema.   Original Report Authenticated By: Gaylyn Rong, M.D.    Dg Chest 2 View  11/27/2012  *RADIOLOGY REPORT*  Clinical Data: Left side chest pain.  CHEST - 2 VIEW  Comparison: PA and lateral chest 11/09/2012.  Findings: The patient is status post aortic and mitral valve surgery.  Small right pleural effusion basilar atelectasis are unchanged.  Left lung is clear.  There is cardiomegaly.  No pneumothorax.  IMPRESSION: No change in right pleural effusion and basilar atelectasis.   Original Report Authenticated By: Holley Dexter, M.D.    Dg Chest 2 View  11/09/2012  *RADIOLOGY REPORT*  Clinical Data: Chest pain and shortness of breath.  History of cardiac valve surgery.  CHEST - 2 VIEW  Comparison:  10/22/2012  Findings: Two views of the chest demonstrate postsurgical changes consistent with mitral valve and tricuspid valve surgery.  Again noted is volume loss in the right lung with right pleural fluid. The heart size is stable.  Few patchy densities at the left lung base may represent scarring or atelectasis.  IMPRESSION: Stable postsurgical changes.  Stable volume loss and pleural fluid on the right side.  Scarring or atelectasis at the left lung base.   Original Report Authenticated By: Richarda Overlie, M.D.     PHYSICAL EXAM General: NAD Neck:JVP 8-9 cm, no thyromegaly or  thyroid nodule.  Lungs: Decreased breath sounds right base.  CV: Nondisplaced PMI.  Heart regular S1/S2, no S3/S4, 1/6 systolic USB.  No peripheral edema.  No carotid bruit.  Normal pedal pulses.  Abdomen: Soft, nontender, no hepatosplenomegaly, no distention.  Neurologic: Alert and oriented x 3.  Psych: Normal affect. Extremities: No clubbing or cyanosis.   TELEMETRY: Reviewed telemetry pt in ectopic atrial rhythm  ASSESSMENT AND PLAN: 52 yo presented to ER with abdominal pain.  He has history of MV replacement.  Found to have SVT in the ER.  1. Rhythm: SVT appears most likely to be atrial tachycardia.  Long R-P on ECGs from ER with HR around 200.  HR 200 this morning (on Coreg 12.5 mg bid), gradual decrease after adenosine and IV metoprolol to around 100 with what appeared to be ectopic atrial tachy with 2:1 block on repeat ECG.  No cocaine on UDS (says none x 6 months).  He had amphetamines on UDS but denies use.  - Increase Coreg to 18.75 mg bid - Will ask EP to see => will ablation be an option? Would like to avoid antiarrhythmic in this patient.  2. HTN: Poorly controlled. Increase Coreg as above.  Increase amlodipine to 10 and hydralazine to 75 mg tid.   3. RLL consolidation: Appears to be effusion + atelectasis.  Procalcitonin < 0.1, WBCs normal, no fever.  Will stop antibiotics.  Review of prior CXRs shows this has  been persistent since cardiac surgery in the fall.   4. Abdominal pain: No acute pathology on CT. ? Gastroparesis.  5. CHF: Acute on chronic diastolic. He diuresed well yesterday but creatinine is up more today.  Volume does look better.  Some of his HTN may be related to volume overload in setting of CKD.  I am going to change Lasix over to po. I will get an echo as he has not had one post-MV replacement.  6. CKD: Elevation in creatinine with diuresis.  Will change lasix over to po.   Marca Ancona 11/30/2012 9:16 AM

## 2012-11-30 NOTE — Progress Notes (Signed)
  Echocardiogram 2D Echocardiogram has been performed.  Ryan Winters 11/30/2012, 10:31 AM

## 2012-11-30 NOTE — Progress Notes (Signed)
At approximately 4:45 AM pt's HR noted started to creep up to 200's & pt feels nauseated & vomited at that time. Pt also c/o of abdominal pain. BP=165/112; Pt is alert & oriented. EKG done to confirm SVT. Dr. Katha Cabal informed.  Vicodin 5-325 2 tabs given as PRN order. Zofran 4 mg. IV & Metoclopramide 10 mg IV given as now dose order. Pt's. HR stays high for 15 mins & self  converted to NSR/ Sinus Tachycardia. Pt is resting at this time & verbalized feels much better. Will continue to monitor pt.

## 2012-11-30 NOTE — Progress Notes (Signed)
0831:  Notified by telemetry tech that pt's hr is up to 200.  EKG obtained and pt in SVT.  Dr. Shirlee Latch at bedside to assess pt.  Orders Adenosine 6 mg IV.  0835:  Gave Adenosine 6 mg IV at this time.  HR gradually slows to junctional tach 130s.  Dr. Shirlee Latch orders Lopressor 5mg  IV at this time which is also given at this time.  Pt's HR decreases to junctional tachycardia rate 101.  EKG obtained again.

## 2012-12-01 ENCOUNTER — Encounter (HOSPITAL_COMMUNITY): Payer: Self-pay | Admitting: Internal Medicine

## 2012-12-01 ENCOUNTER — Encounter (HOSPITAL_COMMUNITY): Payer: Medicaid Other

## 2012-12-01 DIAGNOSIS — I429 Cardiomyopathy, unspecified: Secondary | ICD-10-CM

## 2012-12-01 DIAGNOSIS — I471 Supraventricular tachycardia: Secondary | ICD-10-CM | POA: Diagnosis present

## 2012-12-01 DIAGNOSIS — I5043 Acute on chronic combined systolic (congestive) and diastolic (congestive) heart failure: Secondary | ICD-10-CM | POA: Diagnosis present

## 2012-12-01 DIAGNOSIS — I4719 Other supraventricular tachycardia: Secondary | ICD-10-CM | POA: Diagnosis present

## 2012-12-01 LAB — BASIC METABOLIC PANEL
BUN: 30 mg/dL — ABNORMAL HIGH (ref 6–23)
CO2: 27 mEq/L (ref 19–32)
Chloride: 97 mEq/L (ref 96–112)
Creatinine, Ser: 2.7 mg/dL — ABNORMAL HIGH (ref 0.50–1.35)
GFR calc Af Amer: 30 mL/min — ABNORMAL LOW (ref >90)
Glucose, Bld: 98 mg/dL (ref 70–99)
Potassium: 3.5 mEq/L (ref 3.5–5.1)

## 2012-12-01 LAB — CBC
HCT: 30.5 % — ABNORMAL LOW (ref 39.0–52.0)
Hemoglobin: 9.7 g/dL — ABNORMAL LOW (ref 13.0–17.0)
MCH: 23.5 pg — ABNORMAL LOW (ref 26.0–34.0)
MCHC: 31.8 g/dL (ref 30.0–36.0)
MCV: 74 fL — ABNORMAL LOW (ref 78.0–100.0)
RDW: 18 % — ABNORMAL HIGH (ref 11.5–15.5)

## 2012-12-01 MED ORDER — CARVEDILOL 25 MG PO TABS
25.0000 mg | ORAL_TABLET | Freq: Two times a day (BID) | ORAL | Status: DC
  Administered 2012-12-01 – 2012-12-05 (×8): 25 mg via ORAL
  Filled 2012-12-01 (×11): qty 1

## 2012-12-01 MED ORDER — AMIODARONE HCL 200 MG PO TABS
200.0000 mg | ORAL_TABLET | Freq: Two times a day (BID) | ORAL | Status: DC
  Administered 2012-12-01: 200 mg via ORAL
  Filled 2012-12-01 (×2): qty 1

## 2012-12-01 MED ORDER — AMIODARONE HCL 200 MG PO TABS
200.0000 mg | ORAL_TABLET | Freq: Two times a day (BID) | ORAL | Status: DC
  Administered 2012-12-02 – 2012-12-05 (×8): 200 mg via ORAL
  Filled 2012-12-01 (×10): qty 1

## 2012-12-01 MED ORDER — RIVAROXABAN 15 MG PO TABS
15.0000 mg | ORAL_TABLET | Freq: Every day | ORAL | Status: DC
  Administered 2012-12-01 – 2012-12-04 (×4): 15 mg via ORAL
  Filled 2012-12-01 (×4): qty 1

## 2012-12-01 NOTE — Consult Note (Signed)
Referring Provider: Dr. Berton Mount Primary Care Physician:  Dorrene German, MD Primary Gastroenterologist:  Dr. Dulce Sellar  Reason for Consultation:  Nausea and vomiting, history of gastroparesis  HPI: Ryan Winters is a 52 y.o. male with multiple medical problems including history of mitral valve replacement, atrial tachycardia for which amiodarone will be started in the near future, congestive failure, past history of substance abuse, and psychiatric problems including bipolar disorder and schizophrenia, referred at this time because of a 2 week history of nausea and postprandial vomiting of undigested food, typically occurring within 10 minutes of a meal.   The history is somewhat unreliable, since the patient indicates that he was seen in the past by my partner, Dr. Doy Mince, and treated for gastroparesis about a year ago, but I can find no record of that on our office medical record. He was, however, seen by my partner, Dr. Odelia Gage, for a screening colonoscopy (negative, except for a hyperplastic polyp) about 10 months ago. He also indicates that he had evaluation including a gastric emptying scan, but [facility].   The patient indicates that he was free of gastroparesis symptoms after eating treated with Reglan starting a year ago, around the time of his umbilical hernia repair, up until about 2 weeks ago with the symptoms recurred despite remaining on Reglan. He has sustained a mild weight loss in association with these symptoms. He also has some upper epigastric pain that he indicates is relieved by vomiting.   Past Medical History  Diagnosis Date  . Hypertension   . Bipolar disorder   . Schizophrenia     h/o hospitalized inpt therapy @ the [facility]  . MVP (mitral valve prolapse)     with severe MR s/p repair with annuloplasty and cleft MV repair in 09/2010, last echo June 2013 showed EF 55-60%; restrictive physiology; mod MR,  severe TR. ; Status post mitral valve replacement and tricuspid valve repair 11/13 ejection fraction now 20-25%  . Hyperlipidemia   . H/O: suicide attempt 1998    was hospitalized at [facility]  . Anemia     Hx of iron infusions  . Gastroparesis   . Generalized headaches     sinus  . Arthritis   . Cocaine abuse     per pt none x 3 yrs  . Anxiety   . Chronic systolic heart failure   . GERD (gastroesophageal reflux disease)   . Depression   . Vertigo   . Itchy skin   . Chronic kidney disease     has fistula, not on HD    Past Surgical History  Procedure Date  . Mitral valve repair 05/2010  . Av fistula placement, radiocephalic 05/24/2011    Left arm  . Leg surgery   . Umbilical hernia repair 04/01/2012    Procedure: HERNIA REPAIR UMBILICAL ADULT;  Surgeon: Ardeth Sportsman, MD;  Location: [facility] OR;  Service: General;  Laterality: N/A;  supra umbilical  . Tee without cardioversion 08/25/2012    Procedure: TRANSESOPHAGEAL ECHOCARDIOGRAM (TEE);  Surgeon: Wendall Stade, MD;  Location: [facility] ENDOSCOPY;  Service: Cardiovascular;  Laterality: N/A;  . Mitral valve replacement 09/23/2012    Procedure: REDO MITRAL VALVE REPLACEMENT (MVR);  Surgeon: [name], MD;  Location: [facility] OR;  Service: Open Heart Surgery;  Laterality: N/A;  Nitric Oxide  . Tricuspid valve replacement 09/23/2012    Procedure: TRICUSPID VALVE REPAIR;  Surgeon: [name], MD;  Location: [facility] OR;  Service: Open Heart Surgery;  Laterality: N/A;    Prior to Admission medications   Medication Sig Start Date End Date Taking? Authorizing Provider  albuterol (PROVENTIL HFA;VENTOLIN HFA) 108 (90 BASE) MCG/ACT inhaler Inhale 2 puffs into the lungs every 4 (four) hours as needed. For shortness of breath or wheezing 04/22/12 04/22/13 Yes Lyanne Co, MD  ARIPiprazole (ABILIFY) 5 MG tablet Take 5 mg by mouth daily.     Yes Historical Provider, MD  aspirin 81 MG EC tablet Take 81 mg by mouth daily.     Yes Historical Provider, MD    budesonide-formoterol (SYMBICORT) 160-4.5 MCG/ACT inhaler Inhale 2 puffs into the lungs 2 (two) times daily. 09/30/12  Yes Erin Barrett, PA  carvedilol (COREG) 6.25 MG tablet Take 1 tablet (6.25 mg total) by mouth 2 (two) times daily with a meal. 09/30/12  Yes Erin Barrett, PA  docusate sodium (COLACE) 100 MG capsule Take 100 mg by mouth 2 (two) times daily as needed. For constipation   Yes Historical Provider, MD  metoCLOPramide (REGLAN) 10 MG tablet Take 10 mg by mouth 4 (four) times daily.   Yes Historical Provider, MD  nicotine (NICODERM CQ) 14 mg/24hr patch Place 1 patch onto the skin daily. 10/24/12  Yes Tonny Bollman, MD  omeprazole (PRILOSEC) 40 MG capsule Take 40 mg by mouth 2 (two) times daily as needed. For heart burn 03/25/12 03/25/13 Yes Ardeth Sportsman, MD  promethazine (PHENERGAN) 25 MG suppository Place 1 suppository (25 mg total) rectally every 6 (six) hours as needed for nausea. 01/03/12 01/10/12  Gwyneth Sprout, MD    Current Facility-Administered Medications  Medication Dose Route Frequency Provider Last Rate Last Dose  . 0.9 %  sodium chloride infusion  250 mL Intravenous PRN Laurey Morale, MD      . acetaminophen (TYLENOL) tablet 650 mg  650 mg Oral Q4H PRN Laurey Morale, MD   650 mg at 11/30/12 2047  . albuterol (PROVENTIL HFA;VENTOLIN HFA) 108 (90 BASE) MCG/ACT inhaler 2 puff  2 puff Inhalation Q4H PRN Laurey Morale, MD      . amiodarone (PACERONE) tablet 200 mg  200 mg Oral BID Duke Salvia, MD   200 mg at 12/01/12 1713  . amLODipine (NORVASC) tablet 10 mg  10 mg Oral Daily Laurey Morale, MD   10 mg at 12/01/12 0954  . ARIPiprazole (ABILIFY) tablet 5 mg  5 mg Oral Daily Laurey Morale, MD   5 mg at 11/30/12 2216  . aspirin chewable tablet 81 mg  81 mg Oral Daily Laurey Morale, MD   81 mg at 12/01/12 0954  . budesonide-formoterol (SYMBICORT) 160-4.5 MCG/ACT inhaler 2 puff  2 puff Inhalation BID Laurey Morale, MD   2 puff at 12/01/12 [id number]  . carvedilol (COREG)  tablet 25 mg  25 mg Oral BID WC Rhonda G Barrett, PA   25 mg at 12/01/12 1714  . docusate sodium (COLACE) capsule 100 mg  100 mg Oral BID PRN Laurey Morale, MD      . furosemide (LASIX) tablet 40 mg  40 mg Oral BID Laurey Morale, MD   40 mg at 12/01/12 1714  . hydrALAZINE (APRESOLINE) tablet 75 mg  75 mg Oral Q8H Laurey Morale, MD   75 mg at 12/01/12 1339  . HYDROcodone-acetaminophen (NORCO/VICODIN) 5-325 MG per tablet 1-2 tablet  1-2 tablet Oral Q4H PRN Guadlupe [place], PA-C   2 tablet at 12/01/12  1810  . isosorbide mononitrate (IMDUR) 24 hr tablet 60 mg  60 mg Oral Daily Laurey Morale, MD   60 mg at 12/01/12 0954  . metoCLOPramide (REGLAN) tablet 10 mg  10 mg Oral QID Laurey Morale, MD   10 mg at 12/01/12 1714  . nicotine (NICODERM CQ - dosed in mg/24 hours) patch 14 mg  14 mg Transdermal Q24H Laurey Morale, MD   14 mg at 11/30/12 1105  . nitroGLYCERIN (NITROSTAT) SL tablet 0.4 mg  0.4 mg Sublingual Q5 Min x 3 PRN Laurey Morale, MD      . ondansetron [facility]) injection 4 mg  4 mg Intravenous Q6H PRN Laurey Morale, MD   4 mg at 12/01/12 1000  . pantoprazole (PROTONIX) EC tablet 40 mg  40 mg Oral Daily Laurey Morale, MD   40 mg at 12/01/12 0957  . Rivaroxaban (XARELTO) tablet TABS 15 mg  15 mg Oral Daily Duke Salvia, MD   15 mg at 12/01/12 1714  . sodium chloride 0.9 % injection 3 mL  3 mL Intravenous Q12H Laurey Morale, MD      . sodium chloride 0.9 % injection 3 mL  3 mL Intravenous PRN Laurey Morale, MD      . zolpidem [facility]) tablet 5 mg  5 mg Oral QHS PRN Jonelle Sidle, MD   5 mg at 11/30/12 0020    Allergies as of 11/29/2012 - Review Complete 11/29/2012  Allergen Reaction Noted  . 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 Food Anaphylaxis 06/20/2011    Family History  Problem Relation Age of Onset  . Hypertension Mother     History   Social History  . Marital Status: Married    Spouse Name: N/A    Number of Children: N/A  . Years of Education: N/A     Occupational History  . disabled    Social History Main Topics  . Smoking status: Former Smoker -- 1.5 packs/day for 30 years    Types: Cigarettes    Quit date: 03/21/2011  . Smokeless tobacco: Never Used  . Alcohol Use: No     Comment: Quit alcohol in July of 2011  . Drug Use: No     Comment: None since November when had rectal bleeding and went to hospital  . Sexually Active: No   Other Topics Concern  . Not on file   Social History Narrative   10/22/2013He lives with mother and is separated from wife. Daughter in [place] with 2 grandchildren. He denies any cigs, drugs, alcohol at present.     Review of Systems: no lower GI tract symptoms   Physical Exam: Vital signs in last 24 hours: Temp:  [97.8 F (36.6 C)-98.6 F (37 C)] 98.3 F (36.8 C) (01/13 1645) Pulse Rate:  [102-151] 106  (01/13 1645) Resp:  [17-20] 17  (01/13 1645) BP: (105-154)/(66-125) 152/96 mmHg (01/13 1645) SpO2:  [96 %-100 %] 96 % (01/13 1645) Weight:  [65.4 kg (144 lb 2.9 oz)] 65.4 kg (144 lb 2.9 oz) (01/13 0640) Last BM Date: 11/29/12 General:   Alert,  Well-developed, well-nourished, pleasant and cooperative  [place] male in NAD Head:  Normocephalic and atraumatic. Eyes:  Sclera clear, no icterus.    Mouth:   No ulcerations or lesions.  Oropharynx pink & moist. Lungs:  Clear throughout to auscultation.   No wheezes, crackles, or rhonchi. No evident respiratory distress. Heart:   Regular rate and rhythm; no  murmurs, clicks, rubs,  possible gallop. Abdomen:  Soft, nontender, nontympanitic, and nondistended. No masses, hepatosplenomegaly or ventral hernias noted. Normal bowel sounds, without bruits, guarding, or rebound. No succussion splash present. Msk:   Symmetrical without gross deformities. Extremities:   Without clubbing, cyanosis, or edema. Neurologic:  Alert and coherent;  grossly normal neurologically. Skin:  Intact without significant lesions or rashes. Psych:   Alert and  cooperative. Normal mood and affect, although as noted above, the history is somewhat unreliable.  Intake/Output from previous day: 01/12 0701 - 01/13 0700 In: 324 [I.V.:110; IV Piggyback:214] Out: 1950 [Urine:1950] Intake/Output this shift:    Lab Results:  Basename 12/01/12 0530 11/29/12 1155 11/29/12 0620  WBC 8.8 6.1 6.1  HGB 9.7* 9.3* 10.3*  HCT 30.5* 30.3* 33.6*  PLT 177 177 167   BMET  Basename 12/01/12 0530 11/30/12 0515 11/29/12 1155 11/29/12 0620  NA 134* 135 -- 134*  K 3.5 3.7 -- 3.8  CL 97 97 -- 100  CO2 27 22 -- 23  GLUCOSE 98 130* -- 115*  BUN 30* 27* -- 26*  CREATININE 2.70* 2.65* 2.38* --  CALCIUM 9.2 9.5 -- 9.7   LFT  Basename 11/29/12 0620  PROT 7.6  ALBUMIN 3.6  AST 14  ALT 12  ALKPHOS 155*  BILITOT 0.4  BILIDIR --  IBILI --   PT/INR No results found for this basename: LABPROT:2,INR:2 in the last 72 hours   Studies/Results: No results found.  Impression: 1. Nausea and vomiting, postprandial, with mild epigastric pain relieved by vomiting. This could be a motility problem, or could be medication side effect.  2. History of "gastroparesis,", but unable to confirm this by record review both here  In the hospital and on our office record  Plan: Trial of frequent small aliquots of clear liquids with gradual advancement as tolerated ("gastroparesis diet"). Consider nuclear medicine gastric emptying scan on this admission, especially if his symptoms do not improve.    LOS: 2 days   Harrison Paulson V  12/01/2012, 7:27 PM

## 2012-12-01 NOTE — Progress Notes (Signed)
Patient Name: Ryan Winters Date of Encounter: 12/01/2012  Principal Problem:  *Atrial tachycardia, paroxysmal Active Problems:  Acute on chronic combined systolic and diastolic congestive heart failure, NYHA class 4  Anemia in chronic kidney disease(285.21)  HTN (hypertension), malignant  CKD (chronic kidney disease), stage IV  Mitral valvular regurgitation  GERD (gastroesophageal reflux disease)  Gastroparesis  N&V (nausea and vomiting)   SUBJECTIVE: Pt with DOE, no real change since admission. Still problems with N&V, able to keep down fruit and crackers, liquids, but not other solid food. No cough, fever. Still problems with tachypalpitations and requests EP to see him to discuss ablation. Denies drug use.   OBJECTIVE Filed Vitals:   11/30/12 2345 12/01/12 0414 12/01/12 0640 12/01/12 0730  BP: 105/66 135/90 154/109 144/125  Pulse:    106  Temp: 98.6 F (37 C) 98.6 F (37 C)  97.8 F (36.6 C)  TempSrc: Oral Oral  Oral  Resp:    19  Height:      Weight:   144 lb 2.9 oz (65.4 kg)   SpO2: 97% 97%  97%    Intake/Output Summary (Last 24 hours) at 12/01/12 0837 Last data filed at 11/30/12 2330  Gross per 24 hour  Intake    102 ml  Output   1200 ml  Net  -1098 ml   Filed Weights   11/29/12 1043 11/30/12 0500 12/01/12 0640  Weight: 154 lb 1.6 oz (69.899 kg) 146 lb 6.2 oz (66.4 kg) 144 lb 2.9 oz (65.4 kg)   PHYSICAL EXAM General: Well developed, well nourished, male  in no acute distress. Head: Normocephalic, atraumatic.  Neck: Supple without bruits, JVD at 10 cm. Lungs:  Resp regular and unlabored, few rales bases but generally clear. Heart: RRR, S1, S2, no S3, S4, or murmur. Abdomen: Soft, non-tender, non-distended, BS + x 4.  Extremities: No clubbing, cyanosis, no edema.  Neuro: Alert and oriented X 3. Moves all extremities spontaneously. Psych: Normal affect.  LABS: CBC:  Basename 12/01/12 0530 11/29/12 1155  WBC 8.8 6.1  NEUTROABS -- --  HGB 9.7* 9.3*   HCT 30.5* 30.3*  MCV 74.0* 74.8*  PLT 177 177   Basic Metabolic Panel:  Basename 12/01/12 0530 11/30/12 0515  NA 134* 135  K 3.5 3.7  CL 97 97  CO2 27 22  GLUCOSE 98 130*  BUN 30* 27*  CREATININE 2.70* 2.65*  CALCIUM 9.2 9.5  MG -- --  PHOS -- --   Liver Function Tests:  [facility] 11/29/12 0620  AST 14  ALT 12  ALKPHOS 155*  BILITOT 0.4  PROT 7.6  ALBUMIN 3.6   Cardiac Enzymes:  Basename 11/29/12 1154 11/29/12 0620  CKTOTAL -- --  CKMB -- --  CKMBINDEX -- --  TROPONINI <0.30 <0.30    Basename 11/28/12 1034  TROPIPOC 0.02   BNP: Pro B Natriuretic peptide (BNP)  Date/Time Value Range Status  11/29/2012 11:54 AM 6003.0* 0 - 125 pg/mL Final  11/28/2012 10:27 AM 3367.0* 0 - 125 pg/mL Final   Thyroid Function Tests:  Basename 11/29/12 1155  TSH 1.251  T4TOTAL --  T3FREE --  THYROIDAB --   Drugs of Abuse     Component Value Date/Time   LABOPIA POSITIVE* 11/29/2012 1204   LABOPIA NEGATIVE 05/10/2012 0433   COCAINSCRNUR NONE DETECTED 11/29/2012 1204   COCAINSCRNUR POSITIVE* 05/10/2012 0433   LABBENZ NONE DETECTED 11/29/2012 1204   LABBENZ NEGATIVE 05/10/2012 0433   AMPHETMU POSITIVE* 11/29/2012 1204   AMPHETMU NEGATIVE 05/10/2012  0433   THCU NONE DETECTED 11/29/2012 1204   LABBARB NONE DETECTED 11/29/2012 1204     TELE:  SR, ST, episodic tachycardia -  See DM note yest, possible atrial tach.   ECHO: 11/30/2012  Study Conclusions - Left ventricle: The cavity size was mildly dilated. Systolic function was severely reduced. The estimated ejection fraction was 25%, in the range of 25% to 30%. - Aortic valve: Trivial regurgitation. - Mitral valve: Prior procedures included surgical repair. A bioprosthesis was present and functioning normally. - Left atrium: The atrium was mildly dilated. - Atrial septum: No defect or patent foramen ovale was identified. - Pulmonary arteries: PA peak pressure: 61mm Hg (S). Impressions: - The right ventricular systolic pressure  was increased consistent with moderate pulmonary hypertension.  Radiology/Studies: Ct Abdomen Pelvis Wo Contrast 11/29/2012  *RADIOLOGY REPORT*  Clinical Data: Abdominal pain.  CT ABDOMEN AND PELVIS WITHOUT CONTRAST  Technique:  Multidetector CT imaging of the abdomen and pelvis was performed following the standard protocol without intravenous contrast.  Comparison: 01/03/2012  Findings: Small right pleural effusion.  Consolidation in the right lower lobe with elevation of the right hemidiaphragm.  Heart is mildly enlarged.  Liver, gallbladder, spleen, pancreas, adrenals and kidneys have an unremarkable unenhanced appearance.  Appendix is visualized and is normal.  Small amount of free fluid in the pelvis.  Small bowel is decompressed.  Urinary bladder is nondistended.  No free air or adenopathy.  Aorta is normal caliber.  Stomach and large bowel grossly unremarkable.  IMPRESSION: Small right pleural effusion.  Dense consolidation with air bronchograms in the right lower lobe.  Cannot exclude pneumonia. Elevation of the right hemidiaphragm.  Small amount of free fluid in the pelvis.   Original Report Authenticated By: Charlett Nose, M.D.    Dg Chest 2 View 11/28/2012  *RADIOLOGY REPORT*  Clinical Data: Chest pain, abdominal pain, vomiting, and hx of HTN.  CHEST - 2 VIEW  Comparison: 11/27/2012  Findings: Prosthetic mitral and tricuspid valves noted with stable blunting of the right lateral costophrenic angle and stable opacity at the right lung base.  Cardiomegaly is present.  Faint interstitial accentuation noted with mild prominence of upper zone pulmonary vasculature.  IMPRESSION:  1.  Similar appearance of small to moderate right pleural effusion and cardiomegaly compared to yesterday, with atelectasis at the right lung base. 2.  Faint interstitial accentuation, but without overt edema.   Original Report Authenticated By: Gaylyn Rong, M.D.    Dg Chest 2 View 11/27/2012  *RADIOLOGY REPORT*  Clinical  Data: Left side chest pain.  CHEST - 2 VIEW  Comparison: PA and lateral chest 11/09/2012.  Findings: The patient is status post aortic and mitral valve surgery.  Small right pleural effusion basilar atelectasis are unchanged.  Left lung is clear.  There is cardiomegaly.  No pneumothorax.  IMPRESSION: No change in right pleural effusion and basilar atelectasis.   Original Report Authenticated By: Holley Dexter, M.D.     Current Medications:     . amLODipine  10 mg Oral Daily  . ARIPiprazole  5 mg Oral Daily  . aspirin  81 mg Oral Daily  . budesonide-formoterol  2 puff Inhalation BID  . carvedilol  18.75 mg Oral BID WC  . furosemide  40 mg Oral BID  . heparin  5,000 Units Subcutaneous Q8H  . hydrALAZINE  75 mg Oral Q8H  . isosorbide mononitrate  60 mg Oral Daily  . metoCLOPramide  10 mg Oral QID  . nicotine  14 mg  Transdermal Q24H  . pantoprazole  40 mg Oral Daily  . sodium chloride  3 mL Intravenous Q12H      ASSESSMENT AND PLAN: Principal Problem:  *Atrial tachycardia, paroxysmal - MD advise on EP consult, SBP 105(? Accurate) once but generally 130s-150s so will increase Coreg t0 25 mg BID   Active Problems:  Acute on chronic combined systolic and diastolic congestive heart failure, NYHA class 4 - rec'd IV Lasix on admission but now on PO Rx. Still with DOE, ? Related to arrhythmia.    Anemia in chronic kidney disease(285.21) - follow   HTN (hypertension), malignant - increase BB and follow   CKD (chronic kidney disease), stage IV - Cr up slightly, on po Lasix, continue to follow   Mitral valvular regurgitation - Valve OK, see echo report   GERD (gastroesophageal reflux disease)/Gastroparesis/N&V (nausea and vomiting) -  on Reglan and PPI, MD advise on GI consult  UDS positive for amphetamines and opiates. None of his home meds listed would cause amphetamines to be positive. Pt denies drug use, plan per MD.  Signed, Theodore Demark , PA-C 8:37 AM 12/01/2012  Patient  seen, examined. Available data reviewed. Agree with findings, assessment, and plan as outlined by Theodore Demark, PA-C. Pt with CHF secondary to valvular heart disease, severe longstanding HTN, and CKD. Now with atrial flutter with rapid ventricular rate and worsened cardiomyopathy. Plan IV amiodarone, anticoagulation with renal-dosed Xarelto, and cardioversion in about 48 hours. Greatly appreciate Dr Odessa Fleming consult and management of this patient.  Tonny Bollman, M.D. 12/01/2012 7:25 PM

## 2012-12-01 NOTE — Progress Notes (Signed)
Pharmacist Heart Failure Core Measure Documentation  Assessment: Ryan Winters has an EF documented as 25% - 30%  on 11/30/12 by Echo.  Rationale: Heart failure patients with left ventricular systolic dysfunction (LVSD) and an EF < 40% should be prescribed an angiotensin converting enzyme inhibitor (ACEI) or angiotensin receptor blocker (ARB) at discharge unless a contraindication is documented in the medical record.  This patient is not currently on an ACEI or ARB for HF.  This note is being placed in the record in order to provide documentation that a contraindication to the use of these agents is present for this encounter.  ACE Inhibitor or Angiotensin Receptor Blocker is contraindicated (specify all that apply)  []   ACEI allergy AND ARB allergy []   Angioedema []   Moderate or severe aortic stenosis []   Hyperkalemia []   Hypotension []   Renal artery stenosis [x]   Worsening renal function, preexisting renal disease or dysfunction  Harland German, Pharm D 12/01/2012 12:30 PM

## 2012-12-01 NOTE — Consult Note (Signed)
ELECTROPHYSIOLOGY CONSULT NOTE  Patient ID: Ryan Winters, MRN: [id number], DOB/AGE: Oct 28, 1961 52 y.o. Admit date: 11/29/2012 Date of Consult: 12/01/2012  Primary Physician: Dorrene German, MD Primary Cardiologist: [facility]  Chief Complaint:  Atrial tachycardia   HPI Ryan Winters is a 52 y.o. male with a complex cardiac history admitted with abdominal pain and a history of gastroparesis. He has had problems with nausea and vomiting and has been losing weight. While he was in the ER at [facility] long he was noted to be tachycardic. He was given adenosine which slowed his tachycardia. He was also started on amiodarone.  His cardiac history dates back to a mitral valve repair 2001 complicated by severe periprosthetic regurgitation and underwent bioprosthetic replacement of his mitral valve and tricuspid valve repair 11/13 notably his ejection fraction. Procedurally was 50-55% including the immediate postop TEE. His ejection fraction yesterday was noted at the 25-30%. The patient notes tachypalpitations the last 2-3 weeks. This has been associated with progressive dyspnea on exertion. His congestive symptoms had improved markedly following his bypass surgery only to worsen as noted above.  He has not had edema. He does have 2-3 pillow orthopnea and paroxysmal nocturnal dyspnea.  He has renal insufficiency with a clearance of the 30 range.  Is a long-standing history of polysubstance drug abuse this has been clean for the last years. His urine drug screen demonstrated opiates and amphetamines..  . Past Medical History  Diagnosis Date  . Hypertension   . Bipolar disorder   . Schizophrenia     h/o hospitalized inpt therapy @ the [facility]  . 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 ; Status post mitral valve replacement and tricuspid  valve repair 11/13 ejection fraction now 20-25%  . Hyperlipidemia   . H/O: suicide attempt 1998    was hospitalized at [facility]  . Anemia     Hx of iron infusions  . Gastroparesis   . Generalized headaches     sinus  . Arthritis   . Cocaine abuse     per pt none x 3 yrs  . Anxiety   . Chronic systolic heart failure   . GERD (gastroesophageal reflux disease)   . Depression   . Vertigo   . Itchy skin   . Chronic kidney disease     has fistula, not on HD      Surgical History:  Past Surgical History  Procedure Date  . Mitral valve repair 05/2010  . Av fistula placement, radiocephalic 05/24/2011    Left arm  . Leg surgery   . Umbilical hernia repair 04/01/2012    Procedure: HERNIA REPAIR UMBILICAL ADULT;  Surgeon: Ardeth Sportsman, MD;  Location: [facility] OR;  Service: General;  Laterality: N/A;  supra umbilical  . Tee without cardioversion 08/25/2012    Procedure: TRANSESOPHAGEAL ECHOCARDIOGRAM (TEE);  Surgeon: Wendall Stade, MD;  Location: [facility] ENDOSCOPY;  Service: Cardiovascular;  Laterality: N/A;  . Mitral valve replacement 09/23/2012    Procedure: REDO MITRAL VALVE REPLACEMENT (MVR);  Surgeon: [name], MD;  Location[facility] OR;  Service: Open Heart Surgery;  Laterality: N/A;  Nitric Oxide  . Tricuspid valve replacement 09/23/2012    Procedure: TRICUSPID VALVE REPAIR;  Surgeon: [name], MD;  Location: [facility] OR;  Service: Open Heart Surgery;  Laterality: N/A;  Home Meds: Prior to Admission medications   Medication Sig Start Date End Date Taking? Authorizing Provider  albuterol (PROVENTIL HFA;VENTOLIN HFA) 108 (90 BASE) MCG/ACT inhaler Inhale 2 puffs into the lungs every 4 (four) hours as needed. For shortness of breath or wheezing 04/22/12 04/22/13 Yes Lyanne Co, MD  ARIPiprazole (ABILIFY) 5 MG tablet Take 5 mg by mouth daily.     Yes Historical Provider, MD  aspirin 81 MG EC tablet Take 81 mg by mouth daily.     Yes Historical Provider, MD  budesonide-formoterol  (SYMBICORT) 160-4.5 MCG/ACT inhaler Inhale 2 puffs into the lungs 2 (two) times daily. 09/30/12  Yes Erin Barrett, PA  carvedilol (COREG) 6.25 MG tablet Take 1 tablet (6.25 mg total) by mouth 2 (two) times daily with a meal. 09/30/12  Yes Erin Barrett, PA  docusate sodium (COLACE) 100 MG capsule Take 100 mg by mouth 2 (two) times daily as needed. For constipation   Yes Historical Provider, MD  metoCLOPramide (REGLAN) 10 MG tablet Take 10 mg by mouth 4 (four) times daily.   Yes Historical Provider, MD  nicotine (NICODERM CQ) 14 mg/24hr patch Place 1 patch onto the skin daily. 10/24/12  Yes Tonny Bollman, MD  omeprazole (PRILOSEC) 40 MG capsule Take 40 mg by mouth 2 (two) times daily as needed. For heart burn 03/25/12 03/25/13 Yes Ardeth Sportsman, MD  promethazine (PHENERGAN) 25 MG suppository Place 1 suppository (25 mg total) rectally every 6 (six) hours as needed for nausea. 01/03/12 01/10/12  Gwyneth Sprout, MD    Inpatient Medications:     . amLODipine  10 mg Oral Daily  . ARIPiprazole  5 mg Oral Daily  . aspirin  81 mg Oral Daily  . budesonide-formoterol  2 puff Inhalation BID  . carvedilol  25 mg Oral BID WC  . furosemide  40 mg Oral BID  . heparin  5,000 Units Subcutaneous Q8H  . hydrALAZINE  75 mg Oral Q8H  . isosorbide mononitrate  60 mg Oral Daily  . metoCLOPramide  10 mg Oral QID  . nicotine  14 mg Transdermal Q24H  . pantoprazole  40 mg Oral Daily  . sodium chloride  3 mL Intravenous Q12H    Allergies:  Allergies  Allergen Reactions  . Cashew Nut Oil Anaphylaxis  . Food Anaphylaxis    Strawberries, Cashews, causes throat to swell - affects breathing    History   Social History  . Marital Status: Married    Spouse Name: N/A    Number of Children: N/A  . Years of Education: N/A   Occupational History  . disabled    Social History Main Topics  . Smoking status: Former Smoker -- 1.5 packs/day for 30 years    Types: Cigarettes    Quit date: 03/21/2011  . Smokeless  tobacco: Never Used  . Alcohol Use: No     Comment: Quit alcohol in July of 2011  . Drug Use: No     Comment: None since November when had rectal bleeding and went to hospital  . Sexually Active: No   Other Topics Concern  . Not on file   Social History Narrative   10/22/2013He lives with mother and is separated from wife. Daughter in [place] with 2 grandchildren. He denies any cigs, drugs, alcohol at present.      Family History  Problem Relation Age of Onset  . Hypertension Mother      ROS:  Please see the history of present illness.  All other systems reviewed and negative.    Physical Exam:  Blood pressure 137/90, pulse 102, temperature 98 F (36.7 C), temperature source Oral, resp. rate 18, height 5\' 6"  (1.676 m), weight 144 lb 2.9 oz (65.4 kg), SpO2 98.00%. General: Well developed, well nourished male in no acute distress. Head: Normocephalic, atraumatic, sclera non-icteric, no xanthomas, nares are without discharge. Lymph Nodes:  none Back: without scoliosis/kyphosis*  no CVA tendersness Neck: Negative for carotid bruits. JVD 10-12cm Lungs: Clear bilaterally to auscultation without wheezes, rales, or rhonchi. Breathing is unlabored. Heart: Rapid and regular rhythm with a normal S1 and S2-1 midsystolic murmur at the apex PMI is dyskinetic   Abdomen: Soft, non-tender, non-distended with normoactive bowel sounds. No hepatomegaly. No rebound/guarding. No obvious abdominal masses. Msk:  Strength and tone appear normal for age. Extremities: No clubbing or cyanosis. No edema.  Distal pedal pulses are 2+ and equal bilaterally. Skin: Warm and Dry Neuro: Alert and oriented X 3. CN III-XII intact Grossly normal sensory and motor function . Psych:  Responds to questions appropriately with a normal affect.      Labs: Cardiac Enzymes  Basename 11/29/12 1154 11/29/12 0620  CKTOTAL -- --  CKMB -- --  TROPONINI <0.30 <0.30   CBC Lab Results  Component Value Date   WBC 8.8  12/01/2012   HGB 9.7* 12/01/2012   HCT 30.5* 12/01/2012   MCV 74.0* 12/01/2012   PLT 177 12/01/2012   PROTIME: No results found for this basename: LABPROT:3,INR:3 in the last 72 hours Chemistry   Lab 12/01/12 0530 11/29/12 0620  NA 134* --  K 3.5 --  CL 97 --  CO2 27 --  BUN 30* --  CREATININE 2.70* --  CALCIUM 9.2 --  PROT -- 7.6  BILITOT -- 0.4  ALKPHOS -- 155*  ALT -- 12  AST -- 14  GLUCOSE 98 --   Lipids Lab Results  Component Value Date   CHOL  Value: 134        ATP III CLASSIFICATION:  <200     mg/dL   Desirable  865-784  mg/dL   Borderline High  >=696    mg/dL   High        12/28/5282   HDL 63 05/25/2010   LDLCALC  Value: 63        Total Cholesterol/HDL:CHD Risk Coronary Heart Disease Risk Table                     Men   Women  1/2 Average Risk   3.4   3.3  Average Risk       5.0   4.4  2 X Average Risk   9.6   7.1  3 X Average Risk  23.4   11.0        Use the calculated Patient Ratio above and the CHD Risk Table to determine the patient's CHD Risk.        ATP III CLASSIFICATION (LDL):  <100     mg/dL   Optimal  132-440  mg/dL   Near or Above                    Optimal  130-159  mg/dL   Borderline  102-725  mg/dL   High  >366     mg/dL   Very High 02/20/346   TRIG 39 05/25/2010   BNP Pro B Natriuretic peptide (BNP)  Date/Time Value Range Status  11/29/2012 11:54 AM 6003.0* 0 -  125 pg/mL Final  11/28/2012 10:27 AM 3367.0* 0 - 125 pg/mL Final  09/30/2012  5:40 AM 12918.0* 0 - 125 pg/mL Final  08/02/2012  6:58 PM 2258.0* 0 - 125 pg/mL Final   Miscellaneous Lab Results  Component Value Date   DDIMER 2.37* 09/23/2012    Radiology/Studies:  Ct Abdomen Pelvis Wo Contrast  11/29/2012  *RADIOLOGY REPORT*  Clinical Data: Abdominal pain.  CT ABDOMEN AND PELVIS WITHOUT CONTRAST  Technique:  Multidetector CT imaging of the abdomen and pelvis was performed following the standard protocol without intravenous contrast.  Comparison: 01/03/2012  Findings: Small right pleural effusion.   Consolidation in the right lower lobe with elevation of the right hemidiaphragm.  Heart is mildly enlarged.  Liver, gallbladder, spleen, pancreas, adrenals and kidneys have an unremarkable unenhanced appearance.  Appendix is visualized and is normal.  Small amount of free fluid in the pelvis.  Small bowel is decompressed.  Urinary bladder is nondistended.  No free air or adenopathy.  Aorta is normal caliber.  Stomach and large bowel grossly unremarkable.  IMPRESSION: Small right pleural effusion.  Dense consolidation with air bronchograms in the right lower lobe.  Cannot exclude pneumonia. Elevation of the right hemidiaphragm.  Small amount of free fluid in the pelvis.   Original Report Authenticated By: Charlett Nose, M.D.    Dg Chest 2 View  11/28/2012  *RADIOLOGY REPORT*  Clinical Data: Chest pain, abdominal pain, vomiting, and hx of HTN.  CHEST - 2 VIEW  Comparison: 11/27/2012  Findings: Prosthetic mitral and tricuspid valves noted with stable blunting of the right lateral costophrenic angle and stable opacity at the right lung base.  Cardiomegaly is present.  Faint interstitial accentuation noted with mild prominence of upper zone pulmonary vasculature.  IMPRESSION:  1.  Similar appearance of small to moderate right pleural effusion and cardiomegaly compared to yesterday, with atelectasis at the right lung base. 2.  Faint interstitial accentuation, but without overt edema.   Original Report Authenticated By: Gaylyn Rong, M.D.    Dg Chest 2 View  11/27/2012  *RADIOLOGY REPORT*  Clinical Data: Left side chest pain.  CHEST - 2 VIEW  Comparison: PA and lateral chest 11/09/2012.  Findings: The patient is status post aortic and mitral valve surgery.  Small right pleural effusion basilar atelectasis are unchanged.  Left lung is clear.  There is cardiomegaly.  No pneumothorax.  IMPRESSION: No change in right pleural effusion and basilar atelectasis.   Original Report Authenticated By: Holley Dexter, M.D.      Dg Chest 2 View  11/09/2012  *RADIOLOGY REPORT*  Clinical Data: Chest pain and shortness of breath.  History of cardiac valve surgery.  CHEST - 2 VIEW  Comparison: 10/22/2012  Findings: Two views of the chest demonstrate postsurgical changes consistent with mitral valve and tricuspid valve surgery.  Again noted is volume loss in the right lung with right pleural fluid. The heart size is stable.  Few patchy densities at the left lung base may represent scarring or atelectasis.  IMPRESSION: Stable postsurgical changes.  Stable volume loss and pleural fluid on the right side.  Scarring or atelectasis at the left lung base.   Original Report Authenticated By: Richarda Overlie, M.D.     EKG: atrial tach/flutter with fairly regular PP intervals but some variablilty in p/flutter wave morphology   Assessment and Plan:   atrial tachycardia with variable block (12/01/2012)   Cardiomyopathy, secondary (12/01/2012)   Gastroparesis (02/19/2012)   Anemia in chronic kidney disease(285.21) (10/16/2010)  HTN (hypertension), malignant (06/14/2010)   CKD (chronic kidney disease), stage IV (10/02/2011)   GERD (gastroesophageal reflux disease) (10/31/2011)   N&V (nausea and vomiting) ()   Acute on chronic combined systolic and diastolic congestive heart failure, NYHA class 3 (12/01/2012)   The patient presented with nausea and vomiting in the context of a long-standing history of gastroparesis. This is noted to occur in the context of biventricular heart failure which we will do secondary to newly identified tachycardia; unfortunately I cannot find clear documentation of a post operative ejection fraction and so it is not clear how much of his LV dysfunction newly identified related to the worsening of afterload following his mitral valve replacement, periprocedural issues or his tachycardia. Obviously our hope this is largely related to tachycardia and a control of the rhythm and rate there can be some interval  improvement in left ventricular function.  Some of his GI symptoms which are quite limiting and that prompted his coming to hospital he also aggravated by right ventricular heart failure. I have called GI to see him and will await their thoughtful input  As relates to his rhythm, amiodarone is our only drug options. I will review with Dr. Fawn Kirk the possibilities of catheter ablation, but would recommend that we pursue medical therapy in the hopes of trying to rapidly improve heart muscle function and thereby also decreased periprocedural risks and been ablation can be pursued.  With his left ventricular dysfunction is appropriate, per guidelines, for him to be on oral anticoagulation.*He also has atrial  Tachycardia/flutter with heart rates faster than 200 so it is also appropriate that he be anticoagulated prior to cardioversion.  As there are no clear indication for Coumadin with his bioprosthesis we will use a NOAC given his issues of compliance with his nonvalvular atrial fibrillation. I have discussed this with Dr. Excell Seltzer.  Recommendations based on the above will therefore 1-begin him on Rivaroxaban 15 mg daily given renal insufficiency 2-begin him on amiodarone 3-anticipate TE cardioversion on Wednesday 4-measure iron studies given the persistent anemia the newly identified microcytosis 5-anticipate reassessment of left ventricular function in 4-6 weeks 6-review this with Dr. Fawn Kirk for thoughts regarding catheter ablation 7-have asked GI to see him for gastroparesis nausea and vomiting.         Sherryl Manges

## 2012-12-02 LAB — FERRITIN: Ferritin: 326 ng/mL — ABNORMAL HIGH (ref 22–322)

## 2012-12-02 LAB — FOLATE: Folate: 17.8 ng/mL

## 2012-12-02 LAB — BASIC METABOLIC PANEL
BUN: 26 mg/dL — ABNORMAL HIGH (ref 6–23)
CO2: 27 mEq/L (ref 19–32)
Calcium: 9.5 mg/dL (ref 8.4–10.5)
Creatinine, Ser: 2.55 mg/dL — ABNORMAL HIGH (ref 0.50–1.35)
GFR calc non Af Amer: 28 mL/min — ABNORMAL LOW (ref >90)
Glucose, Bld: 94 mg/dL (ref 70–99)
Sodium: 133 mEq/L — ABNORMAL LOW (ref 135–145)

## 2012-12-02 LAB — IRON AND TIBC
Saturation Ratios: 14 % — ABNORMAL LOW (ref 20–55)
UIBC: 321 ug/dL (ref 125–400)

## 2012-12-02 LAB — VITAMIN B12: Vitamin B-12: 413 pg/mL (ref 211–911)

## 2012-12-02 MED ORDER — POTASSIUM CHLORIDE CRYS ER 20 MEQ PO TBCR
40.0000 meq | EXTENDED_RELEASE_TABLET | Freq: Once | ORAL | Status: AC
  Administered 2012-12-02: 40 meq via ORAL
  Filled 2012-12-02: qty 2

## 2012-12-02 MED ORDER — SODIUM CHLORIDE 0.9 % IV SOLN
INTRAVENOUS | Status: DC
  Administered 2012-12-03: 10 mL/h via INTRAVENOUS

## 2012-12-02 NOTE — Progress Notes (Signed)
GASTROENTEROLOGY PROGRESS NOTE  Problem:   Nausea and vomiting  Subjective: Hungry. Once solid food, and in fact, has been "sneaking" some crackers with his frequent small aliquots of clear liquids. No nausea or vomiting since I saw him yesterday.  Objective: Patient lying in bed, no evident distress  Assessment: Satisfactory response to first step of gastroparesis incremental dietary advancement.  Plan: Have advanced volume of aliquots. If tolerated, may try on frequent small starches (for example, crackers) tomorrow.  Florencia Reasons, M.D. 12/02/2012 6:01 PM

## 2012-12-02 NOTE — ED Provider Notes (Signed)
Medical screening examination/treatment/procedure(s) were performed by non-physician practitioner and as supervising physician I was immediately available for consultation/collaboration.  Gilda Crease, MD 12/02/12 [phone]

## 2012-12-02 NOTE — Progress Notes (Signed)
Utilization review completed.  P.J. Markian Glockner,RN,BSN Case Manager [phone]  

## 2012-12-02 NOTE — Progress Notes (Signed)
Cardiology Progress Note Patient Name: Ryan Winters Date of Encounter: 12/02/2012, 10:42 AM     Subjective  Denies chest pain or shortness of breath. Thinks nausea and vomiting have improved with gastroparesis diet.    Objective   Telemetry: A.fib 100s  Medications: . amiodarone  200 mg Oral BID  . amLODipine  10 mg Oral Daily  . ARIPiprazole  5 mg Oral Daily  . aspirin  81 mg Oral Daily  . budesonide-formoterol  2 puff Inhalation BID  . carvedilol  25 mg Oral BID WC  . furosemide  40 mg Oral BID  . hydrALAZINE  75 mg Oral Q8H  . isosorbide mononitrate  60 mg Oral Daily  . metoCLOPramide  10 mg Oral QID  . nicotine  14 mg Transdermal Q24H  . pantoprazole  40 mg Oral Daily  . rivaroxaban  15 mg Oral Daily  . sodium chloride  3 mL Intravenous Q12H      Physical Exam: Temp:  [98 F (36.7 C)-98.8 F (37.1 C)] 98.6 F (37 C) (01/14 0715) Pulse Rate:  [98-106] 98  (01/14 0715) Resp:  [16-18] 17  (01/14 0715) BP: (117-157)/(79-103) 132/93 mmHg (01/14 0919) SpO2:  [96 %-98 %] 96 % (01/14 0715) Weight:  [142 lb 3.2 oz (64.5 kg)] 142 lb 3.2 oz (64.5 kg) (01/14 0543)  General: Well developed middle aged black male, in no acute distress. Head: Normocephalic, atraumatic, sclera non-icteric, nares are without discharge.  Neck: Supple. Negative for carotid bruits or JVD Lungs: Clear bilaterally to auscultation without wheezes, rales, or rhonchi. Breathing is unlabored. Heart: Rapid and regular rhythm, S1 S2 without murmurs, rubs, or gallops.  Abdomen: Soft, non-tender, non-distended with normoactive bowel sounds. No rebound/guarding. No obvious abdominal masses. Msk:  Strength and tone appear normal for age. Extremities: No edema. No clubbing or cyanosis. Distal pedal pulses are intact and equal bilaterally. Neuro: Alert and oriented X 3. Moves all extremities spontaneously. Psych:  Responds to questions appropriately with a normal affect.   Intake/Output Summary (Last  24 hours) at 12/02/12 1042 Last data filed at 12/01/12 1950  Gross per 24 hour  Intake      0 ml  Output    575 ml  Net   -575 ml    Labs:  [facility] 12/02/12 0448 12/01/12 0530  NA 133* 134*  K 3.3* 3.5  CL 93* 97  CO2 27 27  GLUCOSE 94 98  BUN 26* 30*  CREATININE 2.55* 2.70*  CALCIUM 9.5 9.2   Basename 12/01/12 0530 11/29/12 1155  WBC 8.8 6.1  HGB 9.7* 9.3*  HCT 30.5* 30.3*  MCV 74.0* 74.8*  PLT 177 177   Basename 11/29/12 1154  TROPONINI <0.30   Basename 11/29/12 1155  TSH 1.251   Basename 12/02/12 0448  VITAMINB12 413  FOLATE 17.8  FERRITIN 326*  TIBC 373  IRON 52    Radiology/Studies:   11/29/2012 - CT ABDOMEN AND PELVIS WITHOUT CONTRAST   Findings: Small right pleural effusion.  Consolidation in the right lower lobe with elevation of the right hemidiaphragm.  Heart is mildly enlarged.  Liver, gallbladder, spleen, pancreas, adrenals and kidneys have an unremarkable unenhanced appearance.  Appendix is visualized and is normal.  Small amount of free fluid in the pelvis.  Small bowel is decompressed.  Urinary bladder is nondistended.  No free air or adenopathy.  Aorta is normal caliber.  Stomach and large bowel grossly unremarkable.  IMPRESSION: Small right pleural effusion.  Dense  consolidation with air bronchograms in the right lower lobe.  Cannot exclude pneumonia. Elevation of the right hemidiaphragm.  Small amount of free fluid in the pelvis.     11/28/2012 - CHEST - 2 VIEW   Findings: Prosthetic mitral and tricuspid valves noted with stable blunting of the right lateral costophrenic angle and stable opacity at the right lung base.  Cardiomegaly is present.  Faint interstitial accentuation noted with mild prominence of upper zone pulmonary vasculature.  IMPRESSION:  1.  Similar appearance of small to moderate right pleural effusion and cardiomegaly compared to yesterday, with atelectasis at the right lung base. 2.  Faint interstitial accentuation, but without overt  edema.      11/30/12 - Echo Study Conclusions: - Left ventricle: The cavity size was mildly dilated. Systolic function was severely reduced. The estimated ejection fraction was 25%, in the range of 25% to 30%. - Aortic valve: Trivial regurgitation. - Mitral valve: Prior procedures included surgical repair. A bioprosthesis was present and functioning normally. - Left atrium: The atrium was mildly dilated. - Atrial septum: No defect or patent foramen ovale was identified. - Pulmonary arteries: PA peak pressure: 61mm Hg (S). Impressions: The right ventricular systolic pressure was increased consistent with moderate pulmonary hypertension    Assessment and Plan   1. Atrial Arrhythmias: Rates in the 100s on Amiodarone and Coreg. Plans are for TEE/DCCV on Wednesday 1/15. Cont 15mg  Xarelto (CrCl < 50). EP to discuss thoughts of possible catheter ablation in the future.  2. MR s/p bioprosthetic MVR:  Valve functioning normally on echo  3. Acute on chronic combined systolic and diastolic CHF: EF 16-10%. Euvolemic on exam. I/Os (-) 3.5L. Weight down 12lbs. Crt trending down. Consider reducing Lasix back to home dose of 40mg  daily  4. Chronic Renal Failure: Creatinine baseline appears to range 1.9-2.5.   5. Microcytic Anemia: Hgb stable. Anemia panel shows low normal Iron levels.   6. Hypertension: SBP 110-150s. Cont Norvasc 10mg , Coreg 25mg  bid, Hydralazine 75mg  tid, and Lasix.   7. Hypokalemia: Supplement  8. Abd Pain/Nausea/Vomiting: No acute pathology on CT. Appreciate GI input. Recs for Gastroparesis diet and consideration for nuclear medicine gastric emptying scan if symptoms do not improve. Patient reports improvement in n/v today.  9. Substance Abuse: H/o cocaine abuse, denies x 6 mos. UDS (+) opiates and amphetamines    Signed, HOPE, JESSICA PA-C  Patient seen, examined. Available data reviewed. Agree with findings, assessment, and plan as outlined by [facility], PA-C. Exam  reveals pleasant gentleman in NAD. Lungs are clear. Tele reveals atrial flutter. Plan TEE cardioversion tomorrow. Discussed risks/indication/alternatives with the patient and his wife. He remains on PO amiodarone and Xarelto.  Tonny Bollman, M.D. 12/02/2012 5:22 PM

## 2012-12-03 ENCOUNTER — Encounter (HOSPITAL_COMMUNITY): Admission: RE | Admit: 2012-12-03 | Payer: Medicaid Other | Source: Ambulatory Visit

## 2012-12-03 ENCOUNTER — Ambulatory Visit: Payer: Self-pay | Admitting: Cardiothoracic Surgery

## 2012-12-03 DIAGNOSIS — I059 Rheumatic mitral valve disease, unspecified: Secondary | ICD-10-CM

## 2012-12-03 DIAGNOSIS — I429 Cardiomyopathy, unspecified: Secondary | ICD-10-CM

## 2012-12-03 DIAGNOSIS — D631 Anemia in chronic kidney disease: Secondary | ICD-10-CM

## 2012-12-03 DIAGNOSIS — I10 Essential (primary) hypertension: Secondary | ICD-10-CM

## 2012-12-03 DIAGNOSIS — K3184 Gastroparesis: Secondary | ICD-10-CM

## 2012-12-03 LAB — BASIC METABOLIC PANEL
BUN: 29 mg/dL — ABNORMAL HIGH (ref 6–23)
Calcium: 9.5 mg/dL (ref 8.4–10.5)
GFR calc non Af Amer: 26 mL/min — ABNORMAL LOW (ref >90)
Glucose, Bld: 87 mg/dL (ref 70–99)
Sodium: 133 mEq/L — ABNORMAL LOW (ref 135–145)

## 2012-12-03 LAB — CBC
MCH: 23.8 pg — ABNORMAL LOW (ref 26.0–34.0)
MCHC: 31.7 g/dL (ref 30.0–36.0)
Platelets: 207 10*3/uL (ref 150–400)
RBC: 4.33 MIL/uL (ref 4.22–5.81)
RDW: 19.1 % — ABNORMAL HIGH (ref 11.5–15.5)

## 2012-12-03 MED ORDER — FUROSEMIDE 40 MG PO TABS
40.0000 mg | ORAL_TABLET | Freq: Every day | ORAL | Status: DC
  Administered 2012-12-04 – 2012-12-05 (×2): 40 mg via ORAL
  Filled 2012-12-03 (×2): qty 1

## 2012-12-03 NOTE — Progress Notes (Signed)
Tolerated 60 MLS every 30 minutes of clear liquids. Will allow and lip clear liquid diet today, with crackers. Possible advancement to low residue diet tomorrow if tolerated.  Ryan Winters, M.D. [phone]

## 2012-12-03 NOTE — Progress Notes (Signed)
    SUBJECTIVE: Feeling much better. No SOB. Nausea improved.   BP 136/96  Pulse 98  Temp 98.3 F (36.8 C) (Oral)  Resp 17  Ht 5\' 6"  (1.676 m)  Wt 143 lb 8 oz (65.091 kg)  BMI 23.16 kg/m2  SpO2 97%  Tele: Atrial tach, rate 96 bpm.   Intake/Output Summary (Last 24 hours) at 12/03/12 0802 Last data filed at 12/03/12 0700  Gross per 24 hour  Intake  373.5 ml  Output   1075 ml  Net -701.5 ml    PHYSICAL EXAM General: Well developed, well nourished, in no acute distress. Alert and oriented x 3.  Psych:  Good affect, responds appropriately Neck: No JVD. No masses noted.  Lungs: Clear bilaterally with no wheezes or rhonci noted.  Heart: RRR with no murmurs noted. Abdomen: Bowel sounds are present. Soft, non-tender.  Extremities: No lower extremity edema.   LABS: Basic Metabolic Panel:  Basename 12/03/12 0540 12/02/12 0448  NA 133* 133*  K 3.8 3.3*  CL 96 93*  CO2 26 27  GLUCOSE 87 94  BUN 29* 26*  CREATININE 2.67* 2.55*  CALCIUM 9.5 9.5  MG -- --  PHOS -- --   CBC:  Basename 12/03/12 0540 12/01/12 0530  WBC 6.5 8.8  NEUTROABS -- --  HGB 10.3* 9.7*  HCT 32.5* 30.5*  MCV 75.1* 74.0*  PLT 207 177   Current Meds:    . amiodarone  200 mg Oral BID  . amLODipine  10 mg Oral Daily  . ARIPiprazole  5 mg Oral Daily  . aspirin  81 mg Oral Daily  . budesonide-formoterol  2 puff Inhalation BID  . carvedilol  25 mg Oral BID WC  . furosemide  40 mg Oral BID  . hydrALAZINE  75 mg Oral Q8H  . isosorbide mononitrate  60 mg Oral Daily  . metoCLOPramide  10 mg Oral QID  . nicotine  14 mg Transdermal Q24H  . pantoprazole  40 mg Oral Daily  . rivaroxaban  15 mg Oral Daily  . sodium chloride  3 mL Intravenous Q12H     ASSESSMENT AND PLAN:  1. Atrial Arrhythmias: Rates in the 90s on Amiodarone and Coreg. Tele strip reviewed with EP. Although this could be confused for sinus, it is likely still an atrial tach. Plans are for TEE/DCCV on tomorrow since his third dose of  Xarelto will be today. NPO at midnight. Continue 15mg  Xarelto (CrCl < 50). EP to discuss thoughts of possible catheter ablation in the future.   2. 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 Acute on chronic combined systolic and diastolic CHF: EF 16-10%. Euvolemic on exam. Slight bump in creatinine today. Reduce Lasix to 40 mg po Qdaily.   4. Chronic Renal Failure: Creatinine baseline appears to range 1.9-2.5.   5. Microcytic Anemia: Hgb stable.  6. Hypertension: Controlled. Cont Norvasc 10mg , Coreg 25mg  bid, Hydralazine 75mg  tid  7. Abd Pain/Nausea/Vomiting: No acute pathology on CT. Appreciate GI input. Recs for Gastroparesis diet and consideration for nuclear medicine gastric emptying scan if symptoms do not improve. Patient reports improvement in n/v today.   8. Substance Abuse: H/o cocaine abuse, denies x 6 mos. UDS (+) opiates and amphetamines   Transfer to tele.     Duell Holdren  1/15/20148:02 AM

## 2012-12-04 ENCOUNTER — Encounter (HOSPITAL_COMMUNITY)
Admission: EM | Disposition: A | Discharge status: Home / Self Care | Payer: Self-pay | Source: Home / Self Care | Attending: Cardiovascular Disease

## 2012-12-04 ENCOUNTER — Inpatient Hospital Stay (HOSPITAL_COMMUNITY): Payer: Medicaid Other | Admitting: Anesthesiology

## 2012-12-04 ENCOUNTER — Encounter (HOSPITAL_COMMUNITY): Payer: Self-pay | Admitting: Anesthesiology

## 2012-12-04 ENCOUNTER — Encounter (HOSPITAL_COMMUNITY): Payer: Self-pay | Admitting: *Deleted

## 2012-12-04 DIAGNOSIS — I4892 Unspecified atrial flutter: Secondary | ICD-10-CM

## 2012-12-04 HISTORY — PX: TEE WITHOUT CARDIOVERSION: SHX5443

## 2012-12-04 HISTORY — PX: CARDIOVERSION: SHX1299

## 2012-12-04 LAB — BASIC METABOLIC PANEL
Calcium: 9.4 mg/dL (ref 8.4–10.5)
GFR calc Af Amer: 31 mL/min — ABNORMAL LOW (ref >90)
GFR calc non Af Amer: 27 mL/min — ABNORMAL LOW (ref >90)
Sodium: 135 mEq/L (ref 135–145)

## 2012-12-04 LAB — CBC
MCH: 23.8 pg — ABNORMAL LOW (ref 26.0–34.0)
MCHC: 31.4 g/dL (ref 30.0–36.0)
Platelets: 171 10*3/uL (ref 150–400)
RBC: 4.29 MIL/uL (ref 4.22–5.81)

## 2012-12-04 SURGERY — ECHOCARDIOGRAM, TRANSESOPHAGEAL
Anesthesia: Moderate Sedation

## 2012-12-04 MED ORDER — RIVAROXABAN 15 MG PO TABS
15.0000 mg | ORAL_TABLET | Freq: Every day | ORAL | Status: DC
  Administered 2012-12-05: 15 mg via ORAL
  Filled 2012-12-04 (×2): qty 1

## 2012-12-04 MED ORDER — MIDAZOLAM HCL 10 MG/2ML IJ SOLN
INTRAMUSCULAR | Status: DC | PRN
  Administered 2012-12-04: 2 mg via INTRAVENOUS
  Administered 2012-12-04: 1 mg via INTRAVENOUS

## 2012-12-04 MED ORDER — PROPOFOL 10 MG/ML IV BOLUS
INTRAVENOUS | Status: DC | PRN
  Administered 2012-12-04: 60 mg via INTRAVENOUS

## 2012-12-04 MED ORDER — LIDOCAINE HCL (CARDIAC) 20 MG/ML IV SOLN
INTRAVENOUS | Status: DC | PRN
  Administered 2012-12-04: 60 mg via INTRAVENOUS

## 2012-12-04 MED ORDER — FENTANYL CITRATE 0.05 MG/ML IJ SOLN
INTRAMUSCULAR | Status: AC
  Filled 2012-12-04: qty 4

## 2012-12-04 MED ORDER — DIPHENHYDRAMINE HCL 50 MG/ML IJ SOLN
INTRAMUSCULAR | Status: AC
  Filled 2012-12-04: qty 1

## 2012-12-04 MED ORDER — SODIUM CHLORIDE 0.9 % IV SOLN: INTRAVENOUS | Status: DC

## 2012-12-04 MED ORDER — MIDAZOLAM HCL 5 MG/ML IJ SOLN
INTRAMUSCULAR | Status: AC
  Filled 2012-12-04: qty 3

## 2012-12-04 MED ORDER — BUTAMBEN-TETRACAINE-BENZOCAINE 2-2-14 % EX AERO
INHALATION_SPRAY | CUTANEOUS | Status: DC | PRN
  Administered 2012-12-04: 2 via TOPICAL

## 2012-12-04 MED ORDER — FENTANYL CITRATE 0.05 MG/ML IJ SOLN
INTRAMUSCULAR | Status: DC | PRN
  Administered 2012-12-04 (×2): 25 ug via INTRAVENOUS

## 2012-12-04 MED ORDER — SODIUM CHLORIDE 0.9 % IV SOLN
INTRAVENOUS | Status: DC | PRN
  Administered 2012-12-04: 14:00:00 via INTRAVENOUS

## 2012-12-04 NOTE — Anesthesia Preprocedure Evaluation (Signed)
Anesthesia Evaluation  Patient identified by MRN, date of birth, ID band Patient awake    Reviewed: Allergy & Precautions, H&P , NPO status , Patient's Chart, lab work & pertinent test results, reviewed documented beta blocker date and time   Airway       Dental   Pulmonary    Pulmonary exam normal       Cardiovascular hypertension, Pt. on medications and Pt. on home beta blockers +CHF + dysrhythmias Atrial Fibrillation + Valvular Problems/Murmurs Rhythm:Irregular     Neuro/Psych  Headaches, PSYCHIATRIC DISORDERS Anxiety Depression    GI/Hepatic GERD-  Medicated,  Endo/Other    Renal/GU ESRFRenal disease     Musculoskeletal   Abdominal Normal abdominal exam  (+)   Peds  Hematology   Anesthesia Other Findings   Reproductive/Obstetrics                           Anesthesia Physical Anesthesia Plan  ASA: III  Anesthesia Plan: MAC   Post-op Pain Management:    Induction: Intravenous  Airway Management Planned: Mask  Additional Equipment:   Intra-op Plan:   Post-operative Plan:   Informed Consent: I have reviewed the patients History and Physical, chart, labs and discussed the procedure including the risks, benefits and alternatives for the proposed anesthesia with the patient or authorized representative who has indicated his/her understanding and acceptance.     Plan Discussed with: CRNA, Anesthesiologist and Surgeon  Anesthesia Plan Comments:         Anesthesia Quick Evaluation

## 2012-12-04 NOTE — Progress Notes (Signed)
Pt feels ready for regular food, having tolerated ad lib crackers and fluids yesterday.  Will order a low residue diet (basically, no vegetables).  Pt wants to go home--no objection from GI perspective:  I think he will do ok from here on.  I have advised him to stay on low residue until he feels ready to start gradually adding vegetables/peels/hulls/fiber back into his diet.  Have given pt directions on how to manage future episodes at home.  Will sign off--call if we can be of further help.  Florencia Reasons, M.D. [phone]

## 2012-12-04 NOTE — Interval H&P Note (Signed)
History and Physical Interval Note:  12/04/2012 2:28 PM  Ryan Winters  has presented today for surgery, with the diagnosis of afib  The various methods of treatment have been discussed with the patient and family. After consideration of risks, benefits and other options for treatment, the patient has consented to  Procedure(s) (LRB) with comments: TRANSESOPHAGEAL ECHOCARDIOGRAM (TEE) (N/A) - CV 1015/vicki, dl CARDIOVERSION (N/A) as a surgical intervention .  The patient's history has been reviewed, patient examined, no change in status, stable for surgery.  I have reviewed the patient's chart and labs.  Questions were answered to the patient's satisfaction.     [name] [employer]

## 2012-12-04 NOTE — Preoperative (Signed)
Beta Blockers   Reason not to administer Beta Blockers:Not Applicable 

## 2012-12-04 NOTE — Procedures (Addendum)
Electrical Cardioversion Procedure Note Ryan Winters [id number] [birth year]  Procedure: Electrical Cardioversion Indications:  Atrial Tachycardia/atypical atrial flutter.  The patient has been on Xarelto for 3 days and TEE ruled out thrombus.   Procedure Details Consent: Risks of procedure as well as the alternatives and risks of each were explained to the (patient/caregiver).  Consent for procedure obtained. Time Out: Verified patient identification, verified procedure, site/side was marked, verified correct patient position, special equipment/implants available, medications/allergies/relevent history reviewed, required imaging and test results available.  Performed  Patient placed on cardiac monitor, pulse oximetry, supplemental oxygen as necessary.  Sedation given: propofol Pacer pads placed anterior and posterior chest.  Cardioverted 1 time(s).  Cardioverted at 120J.  Evaluation Findings: Post procedure EKG shows: NSR Complications: None Patient did tolerate procedure well.   Marca Ancona 12/04/2012, 2:53 PM

## 2012-12-04 NOTE — Progress Notes (Signed)
    SUBJECTIVE: No chest pain or SOB. He feels much better.   Telemetry: Atrial tachycardia/flutter, rate 96 bpm.   BP 137/82  Pulse 98  Temp 97.9 F (36.6 C) (Other (Comment))  Resp 18  Ht 5' 5" (1.651 m)  Wt 143 lb 9.6 oz (65.137 kg)  BMI 23.90 kg/m2  SpO2 100%  Intake/Output Summary (Last 24 hours) at 12/04/12 0730 Last data filed at 12/03/12 1331  Gross per 24 hour  Intake   1080 ml  Output      0 ml  Net   1080 ml    PHYSICAL EXAM General: Well developed, well nourished, in no acute distress. Alert and oriented x 3.  Psych:  Good affect, responds appropriately Neck: No JVD. No masses noted.  Lungs: Clear bilaterally with no wheezes or rhonci noted.  Heart: RRR with no murmurs noted. Abdomen: Bowel sounds are present. Soft, non-tender.  Extremities: No lower extremity edema.   LABS: Basic Metabolic Panel:  Basename 12/04/12 0557 12/03/12 0540  NA 135 133*  K 4.1 3.8  CL 97 96  CO2 26 26  GLUCOSE 88 87  BUN 32* 29*  CREATININE 2.58* 2.67*  CALCIUM 9.4 9.5  MG -- --  PHOS -- --   CBC:  Basename 12/04/12 0557 12/03/12 0540  WBC 5.9 6.5  NEUTROABS -- --  HGB 10.2* 10.3*  HCT 32.5* 32.5*  MCV 75.8* 75.1*  PLT 171 207   Current Meds:    . amiodarone  200 mg Oral BID  . amLODipine  10 mg Oral Daily  . ARIPiprazole  5 mg Oral Daily  . aspirin  81 mg Oral Daily  . budesonide-formoterol  2 puff Inhalation BID  . carvedilol  25 mg Oral BID WC  . furosemide  40 mg Oral Daily  . hydrALAZINE  75 mg Oral Q8H  . isosorbide mononitrate  60 mg Oral Daily  . metoCLOPramide  10 mg Oral QID  . nicotine  14 mg Transdermal Q24H  . pantoprazole  40 mg Oral Daily  . rivaroxaban  15 mg Oral Daily  . sodium chloride  3 mL Intravenous Q12H     ASSESSMENT AND PLAN:   1. Atrial Arrhythmias: Rates in the 90s on Amiodarone and Coreg. Tele strip reviewed with EP. Although this could be confused for sinus, it is likely still an atrial tach. Plans are for TEE/DCCV  today. His third dose of Xarelto was yesterday. He is NPO for the procedure.  Continue 15mg Xarelto (CrCl < 50). EP to discuss thoughts of possible catheter ablation in the future.   2. 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 Acute on chronic combined systolic and diastolic CHF: EF 25-30%. Euvolemic on exam. Renal function stable. Continue Lasix. Reduce Lasix to 40 mg po Qdaily.   4. Chronic Renal Failure: Creatinine baseline appears to range 1.9-2.5. Stable.   5. Microcytic Anemia: Hgb stable.   6. Hypertension: Controlled. Cont Norvasc 10mg, Coreg 25mg bid, Hydralazine 75mg tid   7. Abd Pain/Nausea/Vomiting: Improving. Appreciate GI recs. No acute pathology on CT.   8. Substance Abuse: H/o cocaine abuse, denies x 6 mos. UDS (+) opiates and amphetamines    Lynze Reddy  1/16/20147:30 AM  

## 2012-12-04 NOTE — CV Procedure (Signed)
Procedure: TEE  Indication: atrial tachycardia/atypical flutter, pre-DCCV  Sedation: Versed 3 mg IV, Fentanyl 50 mcg IV  Findings: Normal LV size with mild LV hypertrophy.  EF 55% (looks better than reported on earlier TTE).  Mildly dilated RV with mildly decreased systolic function.  Bioprosthetic mitral valve with mean gradient 9 mmHg but pressure half-time is short, so doubt significant stenosis.  Valve opens well, trivial regurgitation.  Status post tricuspid valve repair with trivial TR and mean gradient 2 mmHg (no significant stenosis).  No LAA thrombus.   Ok to proceed to DCCV.   Marca Ancona 12/04/2012 2:49 PM

## 2012-12-04 NOTE — Transfer of Care (Signed)
Immediate Anesthesia Transfer of Care Note  [name]  Procedure(s) Performed: Procedure(s) (LRB) with comments: TRANSESOPHAGEAL ECHOCARDIOGRAM (TEE) (N/A) - CV 1015/vicki, dl CARDIOVERSION (N/A)  Patient Location: Short Stay  Anesthesia Type:MAC  Level of Consciousness: awake, alert  and oriented  Airway & Oxygen Therapy: Patient Spontanous Breathing and Patient connected to nasal cannula oxygen  Post-op Assessment: Report given to PACU RN  Post vital signs: Reviewed and stable  Complications: No apparent anesthesia complications

## 2012-12-04 NOTE — H&P (View-Only) (Signed)
    SUBJECTIVE: No chest pain or SOB. He feels much better.   Telemetry: Atrial tachycardia/flutter, rate 96 bpm.   BP 137/82  Pulse 98  Temp 97.9 F (36.6 C) (Other (Comment))  Resp 18  Ht 5\' 5"  (1.651 m)  Wt 143 lb 9.6 oz (65.137 kg)  BMI 23.90 kg/m2  SpO2 100%  Intake/Output Summary (Last 24 hours) at 12/04/12 0730 Last data filed at 12/03/12 1331  Gross per 24 hour  Intake   1080 ml  Output      0 ml  Net   1080 ml    PHYSICAL EXAM General: Well developed, well nourished, in no acute distress. Alert and oriented x 3.  Psych:  Good affect, responds appropriately Neck: No JVD. No masses noted.  Lungs: Clear bilaterally with no wheezes or rhonci noted.  Heart: RRR with no murmurs noted. Abdomen: Bowel sounds are present. Soft, non-tender.  Extremities: No lower extremity edema.   LABS: Basic Metabolic Panel:  Basename 12/04/12 0557 12/03/12 0540  NA 135 133*  K 4.1 3.8  CL 97 96  CO2 26 26  GLUCOSE 88 87  BUN 32* 29*  CREATININE 2.58* 2.67*  CALCIUM 9.4 9.5  MG -- --  PHOS -- --   CBC:  Basename 12/04/12 0557 12/03/12 0540  WBC 5.9 6.5  NEUTROABS -- --  HGB 10.2* 10.3*  HCT 32.5* 32.5*  MCV 75.8* 75.1*  PLT 171 207   Current Meds:    . amiodarone  200 mg Oral BID  . amLODipine  10 mg Oral Daily  . ARIPiprazole  5 mg Oral Daily  . aspirin  81 mg Oral Daily  . budesonide-formoterol  2 puff Inhalation BID  . carvedilol  25 mg Oral BID WC  . furosemide  40 mg Oral Daily  . hydrALAZINE  75 mg Oral Q8H  . isosorbide mononitrate  60 mg Oral Daily  . metoCLOPramide  10 mg Oral QID  . nicotine  14 mg Transdermal Q24H  . pantoprazole  40 mg Oral Daily  . rivaroxaban  15 mg Oral Daily  . sodium chloride  3 mL Intravenous Q12H     ASSESSMENT AND PLAN:   1. Atrial Arrhythmias: Rates in the 90s on Amiodarone and Coreg. Tele strip reviewed with EP. Although this could be confused for sinus, it is likely still an atrial tach. Plans are for TEE/DCCV  today. His third dose of Xarelto was yesterday. He is NPO for the procedure.  Continue 15mg  Xarelto (CrCl < 50). EP to discuss thoughts of possible catheter ablation in the future.   2. 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 Acute on chronic combined systolic and diastolic CHF: EF 16-10%. Euvolemic on exam. Renal function stable. Continue Lasix. Reduce Lasix to 40 mg po Qdaily.   4. Chronic Renal Failure: Creatinine baseline appears to range 1.9-2.5. Stable.   5. Microcytic Anemia: Hgb stable.   6. Hypertension: Controlled. Cont Norvasc 10mg , Coreg 25mg  bid, Hydralazine 75mg  tid   7. Abd Pain/Nausea/Vomiting: Improving. Appreciate GI recs. No acute pathology on CT.   8. Substance Abuse: H/o cocaine abuse, denies x 6 mos. UDS (+) opiates and amphetamines    Sergey Ishler  1/16/20147:30 AM

## 2012-12-04 NOTE — OR Nursing (Signed)
Post cardioversion EKG showing STEMI, Dr. Shirlee Latch aware, pt no complaints of chest pain, no SOB/acute distress noted, will continue to monitor.

## 2012-12-04 NOTE — Anesthesia Postprocedure Evaluation (Signed)
  Anesthesia Post-op Note  Patient: [employer]  Procedure(s) Performed: Procedure(s) (LRB) with comments: TRANSESOPHAGEAL ECHOCARDIOGRAM (TEE) (N/A) - CV 1015/vicki, dl CARDIOVERSION (N/A)  Patient Location: Short Stay  Anesthesia Type:MAC  Level of Consciousness: awake, alert  and oriented  Airway and Oxygen Therapy: Patient Spontanous Breathing and Patient connected to nasal cannula oxygen  Post-op Pain: none  Post-op Assessment: Post-op Vital signs reviewed, Patient's Cardiovascular Status Stable, Respiratory Function Stable, Patent Airway and Pain level controlled  Post-op Vital Signs: Reviewed and stable  Complications: No apparent anesthesia complications

## 2012-12-04 NOTE — Progress Notes (Signed)
  Echocardiogram Echocardiogram Transesophageal has been performed.  Ryan Winters 12/04/2012, 3:15 PM 

## 2012-12-05 ENCOUNTER — Encounter (HOSPITAL_COMMUNITY): Payer: Medicaid Other

## 2012-12-05 ENCOUNTER — Encounter (HOSPITAL_COMMUNITY): Payer: Self-pay | Admitting: Cardiology

## 2012-12-05 DIAGNOSIS — Z954 Presence of other heart-valve replacement: Secondary | ICD-10-CM

## 2012-12-05 LAB — CULTURE, BLOOD (ROUTINE X 2): Culture: NO GROWTH

## 2012-12-05 LAB — BASIC METABOLIC PANEL
GFR calc Af Amer: 31 mL/min — ABNORMAL LOW (ref >90)
GFR calc non Af Amer: 26 mL/min — ABNORMAL LOW (ref >90)
Potassium: 4.4 mEq/L (ref 3.5–5.1)
Sodium: 134 mEq/L — ABNORMAL LOW (ref 135–145)

## 2012-12-05 LAB — CBC
Hemoglobin: 10.3 g/dL — ABNORMAL LOW (ref 13.0–17.0)
RBC: 4.23 MIL/uL (ref 4.22–5.81)
WBC: 6.2 10*3/uL (ref 4.0–10.5)

## 2012-12-05 MED ORDER — AMLODIPINE BESYLATE 10 MG PO TABS: 10.0000 mg | ORAL_TABLET | Freq: Every day | ORAL | Status: DC

## 2012-12-05 MED ORDER — RIVAROXABAN 15 MG PO TABS: 15.0000 mg | ORAL_TABLET | Freq: Every day | ORAL | Status: DC

## 2012-12-05 MED ORDER — ISOSORBIDE MONONITRATE ER 60 MG PO TB24: 60.0000 mg | ORAL_TABLET | Freq: Every day | ORAL | Status: DC

## 2012-12-05 MED ORDER — AMIODARONE HCL 200 MG PO TABS: 200.0000 mg | ORAL_TABLET | Freq: Two times a day (BID) | ORAL | Status: DC

## 2012-12-05 MED ORDER — CARVEDILOL 25 MG PO TABS: 25.0000 mg | ORAL_TABLET | Freq: Two times a day (BID) | ORAL | Status: DC

## 2012-12-05 MED ORDER — HYDRALAZINE HCL 50 MG PO TABS: 75.0000 mg | ORAL_TABLET | Freq: Three times a day (TID) | ORAL | Status: DC

## 2012-12-05 MED ORDER — FUROSEMIDE 40 MG PO TABS: 40.0000 mg | ORAL_TABLET | Freq: Every day | ORAL | Status: DC

## 2012-12-05 NOTE — Progress Notes (Signed)
TELEMETRY: Reviewed telemetry pt in probably NSR rate 55-60. Difficult to see P waves but p wave amplitude on 12 lead low: Filed Vitals:   12/04/12 1534 12/04/12 2159 12/05/12 0455 12/05/12 1032  BP: 133/69 108/64 131/68 115/64  Pulse:  67 62   Temp:  97.9 F (36.6 C) 98 F (36.7 C)   TempSrc:  Oral Oral   Resp: 15 16 16    Height:      Weight:      SpO2: 99% 99% 98%     Intake/Output Summary (Last 24 hours) at 12/05/12 1038 Last data filed at 12/04/12 1506  Gross per 24 hour  Intake    100 ml  Output      0 ml  Net    100 ml    SUBJECTIVE Feels very well. Anxious to go home. No chest pain or SOB. Abdominal pain resolved.  LABS: Basic Metabolic Panel:  Basename 12/05/12 0642 12/04/12 0557  NA 134* 135  K 4.4 4.1  CL 95* 97  CO2 26 26  GLUCOSE 111* 88  BUN 36* 32*  CREATININE 2.64* 2.58*  CALCIUM 9.5 9.4  MG -- --  PHOS -- --   CBC:  Basename 12/05/12 0642 12/04/12 0557  WBC 6.2 5.9  NEUTROABS -- --  HGB 10.3* 10.2*  HCT 32.1* 32.5*  MCV 75.9* 75.8*  PLT 181 171   Radiology/Studies:  Ct Abdomen Pelvis Wo Contrast  11/29/2012  *RADIOLOGY REPORT*  Clinical Data: Abdominal pain.  CT ABDOMEN AND PELVIS WITHOUT CONTRAST  Technique:  Multidetector CT imaging of the abdomen and pelvis was performed following the standard protocol without intravenous contrast.  Comparison: 01/03/2012  Findings: Small right pleural effusion.  Consolidation in the right lower lobe with elevation of the right hemidiaphragm.  Heart is mildly enlarged.  Liver, gallbladder, spleen, pancreas, adrenals and kidneys have an unremarkable unenhanced appearance.  Appendix is visualized and is normal.  Small amount of free fluid in the pelvis.  Small bowel is decompressed.  Urinary bladder is nondistended.  No free air or adenopathy.  Aorta is normal caliber.  Stomach and large bowel grossly unremarkable.  IMPRESSION: Small right pleural effusion.  Dense consolidation with air bronchograms in the  right lower lobe.  Cannot exclude pneumonia. Elevation of the right hemidiaphragm.  Small amount of free fluid in the pelvis.   Original Report Authenticated By: Charlett Nose, M.D.    Dg Chest 2 View  11/28/2012  *RADIOLOGY REPORT*  Clinical Data: Chest pain, abdominal pain, vomiting, and hx of HTN.  CHEST - 2 VIEW  Comparison: 11/27/2012  Findings: Prosthetic mitral and tricuspid valves noted with stable blunting of the right lateral costophrenic angle and stable opacity at the right lung base.  Cardiomegaly is present.  Faint interstitial accentuation noted with mild prominence of upper zone pulmonary vasculature.  IMPRESSION:  1.  Similar appearance of small to moderate right pleural effusion and cardiomegaly compared to yesterday, with atelectasis at the right lung base. 2.  Faint interstitial accentuation, but without overt edema.   Original Report Authenticated By: Gaylyn Rong, M.D.    Dg Chest 2 View  11/27/2012  *RADIOLOGY REPORT*  Clinical Data: Left side chest pain.  CHEST - 2 VIEW  Comparison: PA and lateral chest 11/09/2012.  Findings: The patient is status post aortic and mitral valve surgery.  Small right pleural effusion basilar atelectasis are unchanged.  Left lung is clear.  There is cardiomegaly.  No pneumothorax.  IMPRESSION: No change in right  pleural effusion and basilar atelectasis.   Original Report Authenticated By: Holley Dexter, M.D.    Dg Chest 2 View  11/09/2012  *RADIOLOGY REPORT*  Clinical Data: Chest pain and shortness of breath.  History of cardiac valve surgery.  CHEST - 2 VIEW  Comparison: 10/22/2012  Findings: Two views of the chest demonstrate postsurgical changes consistent with mitral valve and tricuspid valve surgery.  Again noted is volume loss in the right lung with right pleural fluid. The heart size is stable.  Few patchy densities at the left lung base may represent scarring or atelectasis.  IMPRESSION: Stable postsurgical changes.  Stable volume loss and  pleural fluid on the right side.  Scarring or atelectasis at the left lung base.   Original Report Authenticated By: Richarda Overlie, M.D.     PHYSICAL EXAM General: Well developed, well nourished, in no acute distress. Head: Normal Neck: Negative for carotid bruits. JVD not elevated. Lungs: Clear bilaterally to auscultation without wheezes, rales, or rhonchi. Breathing is unlabored. Heart: RRR S1 S2 without murmurs, or gallops.  Abdomen: Soft, non-tender, non-distended with normoactive bowel sounds. No hepatomegaly. No rebound/guarding. No obvious abdominal masses. Msk:  Strength and tone appears normal for age. Extremities: No clubbing, cyanosis or edema.  Distal pedal pulses are 2+ and equal bilaterally. Neuro: Alert and oriented X 3. Moves all extremities spontaneously. Psych:  Responds to questions appropriately with a normal affect.  ASSESSMENT AND PLAN: 1. SVT/ atrial tachycardia s/p TEE guided DCCV yesterday. Appears to be in NSR today. Will check 12 lead to confirm. Continue amiodarone. Continue Xarelto. OK for discharge today with follow up with Dr. Excell Seltzer.  2. S/p MV replacement with bioprosthesis.  3. HTN now controlled.  4. CKD stage IV stable.  5. Abdominal pain-resolved. ? Due to gastroparesis. Advance diet as per GI.  6. Chronic systolic CHF EF 25-30%. DC on lasix 40 mg daily.  Principal Problem:  *atrial tachycardia with variable block Active Problems:  Anemia in chronic kidney disease(285.21)  HTN (hypertension), malignant  CKD (chronic kidney disease), stage IV  GERD (gastroesophageal reflux disease)  Gastroparesis  N&V (nausea and vomiting)  Acute on chronic combined systolic and diastolic congestive heart failure, NYHA class 3  Cardiomyopathy, secondary    Signed, Peter [place] MD,FACC 12/05/2012 10:47 AM

## 2012-12-05 NOTE — Discharge Summary (Signed)
Discharge Summary   Patient ID: Ryan Winters MRN: [id number], DOB/AGE: 1961-06-21 52 y.o. Admit date: 11/29/2012 D/C date:     12/05/2012  Primary Cardiologist: Excell Seltzer  Primary Discharge Diagnoses:  1. Atrial tachycardia - s/p DCCV, started on amiodarone/Xarelto 2. Acute on chronic diastolic CHF - EF 25-30% by echo 12/02/12 but 55% by TEE 12/04/12 3. Abdominal pain, question secondary to gastroparesis 4. Chronic kidney disease stage IV -Has AV fistula. Creatinine baseline appears to range 1.9-2.5.  5. HTN, difficult to control in past 6. Mitral regurgitation: MV repair in 2011 then redeveloped severe MR. Had bioprosthetic MV replacement with tricuspid valve repair in 11/13 7. Anemia  Secondary Discharge Diagnoses:  1. Schizophrenia  2. H/o cocaine abuse - UDS positive for amphetamines&opiates / negative for cocaine this admission, pt denies use 3. GERD  4. LHC (10/13) with mild CAD 5. Bipolar disorder 6. Hyperlipidemia 7. History of suicide attempt 1998 8. Sinus headaches 9. Arthritis 10. Anxiety 11. GERD 12. Depression 13. Vertigo  Hospital Course: 52 yo with history of MR s/p MV repair and then MV replacement/TV repair in 11/13 as well as CKD and poorly controlled HTN presented to [facility] with abdominal pain for 2-3 days. The pain was peri-umbilical and associated with nausea/vomiting. He has history of gastroparesis but this was worse than in the past. He denied coughing/sputum. He has baseline exertional dyspnea after walking 50-100 feet that had not worsened. In the ER, CT abdomen showed no intra-abdominal pathology but there did appear to be a RLL PNA which later was felt due to atelectasis/effusion. In the ER, he went into a narrow complex regular tachycardia with rate around 200. There was a long R-P interval on the ECG. The tachycardia eventually broke, it is unclear whether adenosine worked or not. He was started on amiodarone. His BP was also quite high. Urine drug  screen was positive for amphetamines and opiates, negative for cocaine -- the patient denied use. Troponins remained negative. WBC was not elevated. He was treated with IV Lasix for acute on chronic diastolic CHF with careful monitoring of his Cr. Medication adjustments were made to his BB, CCB, and hydralazine. The day after admission, he had 2 further episodes of SVT to around 200 - the 2nd episode, he received adenosine without response, then metoprolol 5mg  IV with decrease in HR to 100 - f/u EKG showed ectopic atrial rhythm with 2:1 block. Dr. Shirlee Latch suspected atrial tachycardia was the cause of his SVT. Due to elevation in his Cr, Lasix was changed to PO.   2D Echo 12/02/12 showed EF 25-30%, trivial AI, mitral valve bioprosthesis that was functioning normally, and RVSP elevated consistent with moderate pulmonary hypertension. EP consulted on the patient and reported it was not clear how much of his LV dysfunction newly identified was related to the worsening of afterload following his mitral valve replacement, periprocedural issues or his tachycardia. Dr. Graciela Husbands recommended Xarelto 15mg  daily, IV amiodarone, TEE/DCCV and reassessment of LV function in 4-6 weeks. Hgb remained stable. The patient underwent TEE 12/04/12 demonstrating findings:  Normal LV size with mild LV hypertrophy. EF 55% (looks better than reported on earlier TTE). Mildly dilated RV with mildly decreased systolic function. Bioprosthetic mitral valve with mean gradient 9 mmHg but pressure half-time is short, so doubt significant stenosis. Valve opens well, trivial regurgitation. Status post tricuspid valve repair with trivial TR and mean gradient 2 mmHg (no significant stenosis). No LAA thrombus.  Ryan Winters subsequently underwent cardioversion yesterday. He is feeling  well today without complaints. He has ambulated well. After reviewing this morning's EKG, Dr. Sw[place] has seen and examined him today and feels he is stable for discharge -- he  feels the patient remains in junctional rhythm at this time versus ectopic atrial rhythm. Per our discussion, the patient will be continued on amiodarone/Xarelto and aspirin will be stopped. Periodic monitoring of his organ systems will need to be performed if he remains on amiodarone at the discretion of primary cardiologist. Continuation of his Xarelto at his followup appointment will also need to be addressed. A voicemail was left on our scheduler's line for a followup with Dr. Excell Seltzer in 2 weeks and for the patient to also get plugged into the Anticoag clinic.  With regards to his nausea/vomiting, Dr. Matthias Hughs with GI saw the patient and recommended smaller, frequent aliquots of clear liquids with gradual advancement as tolerated ("gastroparesis diet"). The patient seemed to improve with this measure.   Discharge Vitals: Blood pressure 115/64, pulse 62, temperature 98 F (36.7 C), temperature source Oral, resp. rate 16, height 5\' 5"  (1.651 m), weight 143 lb 9.6 oz (65.137 kg), SpO2 98.00%.  Labs: Lab Results  Component Value Date   WBC 6.2 12/05/2012   HGB 10.3* 12/05/2012   HCT 32.1* 12/05/2012   MCV 75.9* 12/05/2012   PLT 181 12/05/2012     Lab 12/05/12 0642 11/29/12 0620  NA 134* --  K 4.4 --  CL 95* --  CO2 26 --  BUN 36* --  CREATININE 2.64* --  CALCIUM 9.5 --  PROT -- 7.6  BILITOT -- 0.4  ALKPHOS -- 155*  ALT -- 12  AST -- 14  GLUCOSE 111* --    Diagnostic Studies/Procedures   1. TEE - see above  2. 2D Echo 11/30/12 Study Conclusions - Left ventricle: The cavity size was mildly dilated. Systolic function was severely reduced. The estimated ejection fraction was 25%, in the range of 25% to 30%. - Aortic valve: Trivial regurgitation. - Mitral valve: Prior procedures included surgical repair. A bioprosthesis was present and functioning normally. - Left atrium: The atrium was mildly dilated. - Atrial septum: No defect or patent foramen ovale was identified. - Pulmonary  arteries: PA peak pressure: 61mm Hg (S). Impressions: - The right ventricular systolic pressure was increased consistent with moderate pulmonary hypertension.  3. Ct Abdomen Pelvis Wo Contrast 11/29/2012  *RADIOLOGY REPORT*  Clinical Data: Abdominal pain.  CT ABDOMEN AND PELVIS WITHOUT CONTRAST  Technique:  Multidetector CT imaging of the abdomen and pelvis was performed following the standard protocol without intravenous contrast.  Comparison: 01/03/2012  Findings: Small right pleural effusion.  Consolidation in the right lower lobe with elevation of the right hemidiaphragm.  Heart is mildly enlarged.  Liver, gallbladder, spleen, pancreas, adrenals and kidneys have an unremarkable unenhanced appearance.  Appendix is visualized and is normal.  Small amount of free fluid in the pelvis.  Small bowel is decompressed.  Urinary bladder is nondistended.  No free air or adenopathy.  Aorta is normal caliber.  Stomach and large bowel grossly unremarkable.  IMPRESSION: Small right pleural effusion.  Dense consolidation with air bronchograms in the right lower lobe.  Cannot exclude pneumonia. Elevation of the right hemidiaphragm.  Small amount of free fluid in the pelvis.   Original Report Authenticated By: Charlett Nose, M.D.    4.  Chest 2 View 11/28/2012  *RADIOLOGY REPORT*  Clinical Data: Chest pain, abdominal pain, vomiting, and hx of HTN.  CHEST - 2 VIEW  Comparison:  11/27/2012  Findings: Prosthetic mitral and tricuspid valves noted with stable blunting of the right lateral costophrenic angle and stable opacity at the right lung base.  Cardiomegaly is present.  Faint interstitial accentuation noted with mild prominence of upper zone pulmonary vasculature.  IMPRESSION:  1.  Similar appearance of small to moderate right pleural effusion and cardiomegaly compared to yesterday, with atelectasis at the right lung base. 2.  Faint interstitial accentuation, but without overt edema.   Original Report Authenticated By: Gaylyn Rong, M.D.    Discharge Medications   Current Discharge Medication List    START taking these medications   Details  amiodarone (PACERONE) 200 MG tablet Take 1 tablet (200 mg total) by mouth 2 (two) times daily. Qty: 60 tablet, Refills: 1    amLODipine (NORVASC) 10 MG tablet Take 1 tablet (10 mg total) by mouth daily. Qty: 30 tablet, Refills: 3    hydrALAZINE (APRESOLINE) 50 MG tablet Take 1.5 tablets (75 mg total) by mouth every 8 (eight) hours. Qty: 135 tablet, Refills: 3    isosorbide mononitrate (IMDUR) 60 MG 24 hr tablet Take 1 tablet (60 mg total) by mouth daily. Qty: 30 tablet, Refills: 3    Rivaroxaban (XARELTO) 15 MG TABS tablet Take 1 tablet (15 mg total) by mouth daily. Qty: 30 tablet, Refills: 1   Comments: Talk to your doctor about additional refills.      CONTINUE these medications which have CHANGED   Details  carvedilol (COREG) 25 MG tablet Take 1 tablet (25 mg total) by mouth 2 (two) times daily with a meal. Qty: 60 tablet, Refills: 3    furosemide (LASIX) 40 MG tablet Take 1 tablet (40 mg total) by mouth daily. Qty: 30 tablet, Refills: 3   Associated Diagnoses: Chronic diastolic heart failure      CONTINUE these medications which have NOT CHANGED   Details  albuterol (PROVENTIL HFA;VENTOLIN HFA) 108 (90 BASE) MCG/ACT inhaler Inhale 2 puffs into the lungs every 4 (four) hours as needed. For shortness of breath or wheezing    ARIPiprazole (ABILIFY) 5 MG tablet Take 5 mg by mouth daily.      budesonide-formoterol (SYMBICORT) 160-4.5 MCG/ACT inhaler Inhale 2 puffs into the lungs 2 (two) times daily. Qty: 1 Inhaler, Refills: 1    docusate sodium (COLACE) 100 MG capsule Take 100 mg by mouth 2 (two) times daily as needed. For constipation    metoCLOPramide (REGLAN) 10 MG tablet Take 10 mg by mouth 4 (four) times daily.    nicotine (NICODERM CQ) 14 mg/24hr patch Place 1 patch onto the skin daily. Qty: 28 patch, Refills: 0   Associated Diagnoses:  Tobacco abuse    omeprazole (PRILOSEC) 40 MG capsule Take 40 mg by mouth 2 (two) times daily as needed. For heart burn    promethazine (PHENERGAN) 25 MG suppository Place 1 suppository (25 mg total) rectally every 6 (six) hours as needed for nausea.       STOP taking these medications     aspirin 81 MG EC tablet Comments:  Reason for Stopping: due to discussion with Dr. Swaziland      HYDROcodone-acetaminophen [facility]) 7.5-325 MG per tablet Comments:  Reason for Stopping:       hydrOXYzine (VISTARIL) 50 MG capsule Comments:  Reason for Stopping:       promethazine (PHENERGAN) 25 MG suppository Comments:  Reason for Stopping: expired med      sildenafil (REVATIO) 20 MG tablet Comments:  Reason for Stopping:  Stopped on admission  by MD           Disposition   The patient will be discharged in stable condition to home. Discharge Orders    Future Appointments: Provider: Department: Dept Phone: Center:   12/08/2012 9:45 AM Mc-Phase2 Monitor 23 [facility] CARDIAC REHAB [phone] None   12/10/2012 9:45 AM Mc-Phase2 Monitor 23 [facility] CARDIAC REHAB [phone] None   12/12/2012 9:45 AM Mc-Phase2 Monitor 23 [facility] CARDIAC REHAB [phone] None   12/15/2012 9:45 AM Mc-Phase2 Monitor 23 [facility] CARDIAC REHAB [phone] None   12/17/2012 9:45 AM Mc-Phase2 Monitor 23 [facility] CARDIAC REHAB [phone] None   12/19/2012 9:45 AM Mc-Phase2 Monitor 23 [facility] CARDIAC REHAB [phone] None   12/22/2012 9:45 AM Mc-Phase2 Monitor 23 [facility] CARDIAC REHAB [phone] None   12/23/2012 7:30 AM [facility]-Echo Echo 1 [facility] SITE 3 ECHO LAB [phone] None   12/24/2012 9:45 AM Mc-Phase2 Monitor 23 [facility] CARDIAC REHAB [phone] None   12/26/2012 9:45 AM Mc-Phase2 Monitor 23 [facility] CARDIAC REHAB  [phone] None   12/29/2012 9:45 AM Mc-Phase2 Monitor 23 [facility] CARDIAC REHAB [phone] None   12/30/2012 9:45 AM Tonny Bollman, MD [facility] Main Office [place]) [phone] [facility]   12/31/2012 9:45 AM Mc-Phase2 Monitor 23 [facility] CARDIAC REHAB [phone] None   01/02/2013 9:45 AM Mc-Phase2 Monitor 23 [facility] CARDIAC REHAB [phone] None   01/05/2013 9:45 AM Mc-Phase2 Monitor 23 [facility] CARDIAC REHAB [phone] None   01/07/2013 9:45 AM Mc-Phase2 Monitor 23 [facility] CARDIAC REHAB [phone] None   01/09/2013 9:45 AM Mc-Phase2 Monitor 23 [facility] CARDIAC REHAB [phone] None   01/12/2013 9:45 AM Mc-Phase2 Monitor 23 [facility] CARDIAC REHAB [phone] None   01/14/2013 9:45 AM Mc-Phase2 Monitor 23 [facility] CARDIAC REHAB [phone] None   01/16/2013 9:45 AM Mc-Phase2 Monitor 23 [facility] CARDIAC REHAB [phone] None   01/19/2013 9:45 AM Mc-Phase2 Monitor 23 [facility] CARDIAC REHAB [phone] None   01/21/2013 9:45 AM Mc-Phase2 Monitor 23 [facility] CARDIAC REHAB [phone] None   01/23/2013 9:45 AM Mc-Phase2 Monitor 23 [facility] CARDIAC REHAB [phone] None   01/26/2013 9:45 AM Mc-Phase2 Monitor 23 [facility] CARDIAC REHAB [phone] None   01/28/2013 9:45 AM Mc-Phase2 Monitor 23 [facility] CARDIAC REHAB [phone] None   01/30/2013 9:45 AM Mc-Phase2 Monitor 23 [facility] CARDIAC REHAB [phone] None   02/02/2013 9:45 AM Mc-Phase2 Monitor 23 [facility] CARDIAC REHAB [phone] None   02/04/2013 9:45 AM Mc-Phase2 Monitor 23 [facility] CARDIAC REHAB [phone] None   02/06/2013 9:45 AM Mc-Phase2 Monitor 23 [facility] CARDIAC  REHAB [phone] None     Future Orders Please Complete By Expires   Diet - low sodium heart healthy      Scheduling Instructions:   Dr. Matthias Hughs wants you to stay on a low-residue diet until you feel ready to start gradually adding vegetables/peels/hulls/fiber back into your diet.   Increase activity slowly      Discharge instructions      Comments:   Patients on amiodarone may require occasinal monitoring of other organ systems, such as lungs, thyroid, eyes, and liver function. Please talk to your doctor at your  follow-up appointments about what monitoring may be necessary for you.     Follow-up Information    Follow up with Tonny Bollman, MD. (Our office will call you for an appointment)    Contact information:   [address] [place] [address] [phone]       Follow up with Dorrene German, MD. (Your labwork showed that you have mild anemia (low blood count) - please follow up with primary care doctor for this.)    Contact information:   [address] [place] [address] [phone]            Duration of Discharge Encounter: Greater than 30 minutes including physician and PA time.  Signed, Ronie Spies PA-C 12/05/2012, 12:09 PM

## 2012-12-05 NOTE — Discharge Summary (Signed)
Patient seen and examined and history reviewed. Agree with above findings and plan. See earlier rounding note. Ecg shows an ectopic atrial rhythm rate 55.  Ryan Winters 12/05/2012 12:46 PM

## 2012-12-08 ENCOUNTER — Encounter (HOSPITAL_COMMUNITY): Payer: Medicaid Other

## 2012-12-10 ENCOUNTER — Encounter (HOSPITAL_COMMUNITY): Payer: Medicaid Other

## 2012-12-12 ENCOUNTER — Encounter (HOSPITAL_COMMUNITY): Payer: Medicaid Other

## 2012-12-15 ENCOUNTER — Encounter (HOSPITAL_COMMUNITY): Payer: Medicaid Other

## 2012-12-17 ENCOUNTER — Encounter (HOSPITAL_COMMUNITY): Payer: Medicaid Other

## 2012-12-19 ENCOUNTER — Encounter (HOSPITAL_COMMUNITY): Payer: Medicaid Other

## 2012-12-22 ENCOUNTER — Encounter (HOSPITAL_COMMUNITY): Payer: Medicaid Other

## 2012-12-23 ENCOUNTER — Other Ambulatory Visit (HOSPITAL_COMMUNITY): Payer: Self-pay

## 2012-12-24 ENCOUNTER — Encounter (HOSPITAL_COMMUNITY): Payer: Medicaid Other

## 2012-12-26 ENCOUNTER — Encounter (HOSPITAL_COMMUNITY): Payer: Medicaid Other

## 2012-12-29 ENCOUNTER — Encounter (HOSPITAL_COMMUNITY): Payer: Medicaid Other

## 2012-12-30 ENCOUNTER — Ambulatory Visit: Payer: Self-pay | Admitting: Cardiovascular Disease

## 2012-12-31 ENCOUNTER — Encounter (HOSPITAL_COMMUNITY): Payer: Medicaid Other

## 2013-01-01 ENCOUNTER — Ambulatory Visit: Payer: Self-pay | Admitting: Cardiovascular Disease

## 2013-01-02 ENCOUNTER — Encounter (HOSPITAL_COMMUNITY): Payer: Medicaid Other

## 2013-01-05 ENCOUNTER — Encounter (HOSPITAL_COMMUNITY): Payer: Medicaid Other

## 2013-01-07 ENCOUNTER — Encounter (HOSPITAL_COMMUNITY): Payer: Medicaid Other

## 2013-01-09 ENCOUNTER — Ambulatory Visit: Payer: Self-pay | Admitting: Cardiovascular Disease

## 2013-01-09 ENCOUNTER — Encounter (HOSPITAL_COMMUNITY): Payer: Medicaid Other

## 2013-01-12 ENCOUNTER — Encounter (HOSPITAL_COMMUNITY): Payer: Medicaid Other

## 2013-01-14 ENCOUNTER — Encounter (HOSPITAL_COMMUNITY): Payer: Medicaid Other

## 2013-01-16 ENCOUNTER — Encounter (HOSPITAL_COMMUNITY): Payer: Medicaid Other

## 2013-01-19 ENCOUNTER — Encounter (HOSPITAL_COMMUNITY): Payer: Medicaid Other

## 2013-01-21 ENCOUNTER — Encounter (HOSPITAL_COMMUNITY): Payer: Medicaid Other

## 2013-01-22 ENCOUNTER — Encounter: Payer: Self-pay | Admitting: Cardiovascular Disease

## 2013-01-23 ENCOUNTER — Encounter (HOSPITAL_COMMUNITY): Payer: Medicaid Other

## 2013-01-26 ENCOUNTER — Encounter (HOSPITAL_COMMUNITY): Payer: Medicaid Other

## 2013-01-28 ENCOUNTER — Encounter (HOSPITAL_COMMUNITY): Payer: Medicaid Other

## 2013-01-30 ENCOUNTER — Encounter (HOSPITAL_COMMUNITY): Payer: Medicaid Other

## 2013-02-02 ENCOUNTER — Encounter (HOSPITAL_COMMUNITY): Payer: Medicaid Other

## 2013-02-04 ENCOUNTER — Encounter (HOSPITAL_COMMUNITY): Payer: Medicaid Other

## 2013-02-06 ENCOUNTER — Encounter (HOSPITAL_COMMUNITY): Payer: Medicaid Other

## 2013-02-16 ENCOUNTER — Ambulatory Visit: Payer: Self-pay | Admitting: Cardiovascular Disease

## 2013-03-09 ENCOUNTER — Ambulatory Visit: Payer: Self-pay | Admitting: Cardiovascular Disease

## 2013-03-30 ENCOUNTER — Inpatient Hospital Stay (HOSPITAL_COMMUNITY)
Admission: EM | Admit: 2013-03-30 | Discharge: 2013-04-01 | DRG: 292 | Disposition: A | Discharge status: Home / Self Care | Payer: Medicaid Other | Attending: Cardiology | Admitting: Cardiology

## 2013-03-30 ENCOUNTER — Emergency Department (HOSPITAL_COMMUNITY): Payer: Medicaid Other

## 2013-03-30 ENCOUNTER — Encounter (HOSPITAL_COMMUNITY): Payer: Self-pay | Admitting: Emergency Medicine

## 2013-03-30 DIAGNOSIS — F319 Bipolar disorder, unspecified: Secondary | ICD-10-CM | POA: Diagnosis present

## 2013-03-30 DIAGNOSIS — I491 Atrial premature depolarization: Secondary | ICD-10-CM | POA: Diagnosis present

## 2013-03-30 DIAGNOSIS — F1910 Other psychoactive substance abuse, uncomplicated: Secondary | ICD-10-CM | POA: Diagnosis present

## 2013-03-30 DIAGNOSIS — I5043 Acute on chronic combined systolic (congestive) and diastolic (congestive) heart failure: Secondary | ICD-10-CM

## 2013-03-30 DIAGNOSIS — Z952 Presence of prosthetic heart valve: Secondary | ICD-10-CM

## 2013-03-30 DIAGNOSIS — R079 Chest pain, unspecified: Secondary | ICD-10-CM

## 2013-03-30 DIAGNOSIS — Z954 Presence of other heart-valve replacement: Secondary | ICD-10-CM

## 2013-03-30 DIAGNOSIS — I5033 Acute on chronic diastolic (congestive) heart failure: Principal | ICD-10-CM

## 2013-03-30 DIAGNOSIS — I129 Hypertensive chronic kidney disease with stage 1 through stage 4 chronic kidney disease, or unspecified chronic kidney disease: Secondary | ICD-10-CM | POA: Diagnosis present

## 2013-03-30 DIAGNOSIS — Z7982 Long term (current) use of aspirin: Secondary | ICD-10-CM

## 2013-03-30 DIAGNOSIS — R609 Edema, unspecified: Secondary | ICD-10-CM | POA: Diagnosis present

## 2013-03-30 DIAGNOSIS — I10 Essential (primary) hypertension: Secondary | ICD-10-CM | POA: Diagnosis present

## 2013-03-30 DIAGNOSIS — N184 Chronic kidney disease, stage 4 (severe): Secondary | ICD-10-CM | POA: Diagnosis present

## 2013-03-30 DIAGNOSIS — E785 Hyperlipidemia, unspecified: Secondary | ICD-10-CM | POA: Diagnosis present

## 2013-03-30 DIAGNOSIS — D649 Anemia, unspecified: Secondary | ICD-10-CM | POA: Diagnosis present

## 2013-03-30 DIAGNOSIS — I498 Other specified cardiac arrhythmias: Secondary | ICD-10-CM | POA: Diagnosis present

## 2013-03-30 DIAGNOSIS — I5032 Chronic diastolic (congestive) heart failure: Secondary | ICD-10-CM

## 2013-03-30 DIAGNOSIS — I509 Heart failure, unspecified: Secondary | ICD-10-CM

## 2013-03-30 DIAGNOSIS — R0789 Other chest pain: Secondary | ICD-10-CM | POA: Diagnosis present

## 2013-03-30 DIAGNOSIS — I2510 Atherosclerotic heart disease of native coronary artery without angina pectoris: Secondary | ICD-10-CM | POA: Diagnosis present

## 2013-03-30 DIAGNOSIS — N2889 Other specified disorders of kidney and ureter: Secondary | ICD-10-CM | POA: Diagnosis present

## 2013-03-30 DIAGNOSIS — Z87891 Personal history of nicotine dependence: Secondary | ICD-10-CM

## 2013-03-30 DIAGNOSIS — IMO0002 Reserved for concepts with insufficient information to code with codable children: Secondary | ICD-10-CM

## 2013-03-30 DIAGNOSIS — Z9889 Other specified postprocedural states: Secondary | ICD-10-CM

## 2013-03-30 DIAGNOSIS — K219 Gastro-esophageal reflux disease without esophagitis: Secondary | ICD-10-CM | POA: Diagnosis present

## 2013-03-30 DIAGNOSIS — Z91199 Patient's noncompliance with other medical treatment and regimen due to unspecified reason: Secondary | ICD-10-CM

## 2013-03-30 DIAGNOSIS — Z9119 Patient's noncompliance with other medical treatment and regimen: Secondary | ICD-10-CM

## 2013-03-30 DIAGNOSIS — F411 Generalized anxiety disorder: Secondary | ICD-10-CM | POA: Diagnosis present

## 2013-03-30 DIAGNOSIS — F209 Schizophrenia, unspecified: Secondary | ICD-10-CM | POA: Diagnosis present

## 2013-03-30 DIAGNOSIS — F1410 Cocaine abuse, uncomplicated: Secondary | ICD-10-CM | POA: Diagnosis present

## 2013-03-30 HISTORY — DX: Right lower quadrant pain: R10.31

## 2013-03-30 HISTORY — DX: Polyneuropathy, unspecified: G62.9

## 2013-03-30 HISTORY — DX: Left lower quadrant pain: R10.32

## 2013-03-30 LAB — BASIC METABOLIC PANEL
CO2: 22 mEq/L (ref 19–32)
Calcium: 8.8 mg/dL (ref 8.4–10.5)
Creatinine, Ser: 2.82 mg/dL — ABNORMAL HIGH (ref 0.50–1.35)
GFR calc Af Amer: 28 mL/min — ABNORMAL LOW (ref >90)
GFR calc non Af Amer: 24 mL/min — ABNORMAL LOW (ref >90)

## 2013-03-30 LAB — CBC
MCH: 24.6 pg — ABNORMAL LOW (ref 26.0–34.0)
MCHC: 32.3 g/dL (ref 30.0–36.0)
MCV: 76.3 fL — ABNORMAL LOW (ref 78.0–100.0)
Platelets: 173 10*3/uL (ref 150–400)
RDW: 15.8 % — ABNORMAL HIGH (ref 11.5–15.5)

## 2013-03-30 LAB — TROPONIN I
Troponin I: <0.3 ng/mL (ref <0.30)
Troponin I: <0.3 ng/mL (ref <0.30)

## 2013-03-30 LAB — RAPID URINE DRUG SCREEN, HOSP PERFORMED
Amphetamines: NOT DETECTED
Benzodiazepines: NOT DETECTED

## 2013-03-30 LAB — PRO B NATRIURETIC PEPTIDE: Pro B Natriuretic peptide (BNP): 13925 pg/mL — ABNORMAL HIGH (ref 0–125)

## 2013-03-30 LAB — PROTIME-INR: INR: 1.21 (ref 0.00–1.49)

## 2013-03-30 MED ORDER — ONDANSETRON HCL 4 MG PO TABS: 4.0000 mg | ORAL_TABLET | Freq: Four times a day (QID) | ORAL | Status: DC | PRN

## 2013-03-30 MED ORDER — HYDRALAZINE HCL 50 MG PO TABS
75.0000 mg | ORAL_TABLET | Freq: Three times a day (TID) | ORAL | Status: DC
  Administered 2013-03-30 – 2013-04-01 (×6): 75 mg via ORAL
  Filled 2013-03-30 (×9): qty 1

## 2013-03-30 MED ORDER — ENOXAPARIN SODIUM 30 MG/0.3ML ~~LOC~~ SOLN
30.0000 mg | SUBCUTANEOUS | Status: DC
  Filled 2013-03-30 (×3): qty 0.3

## 2013-03-30 MED ORDER — ASPIRIN 325 MG PO TABS
325.0000 mg | ORAL_TABLET | Freq: Once | ORAL | Status: AC
  Administered 2013-03-30: 325 mg via ORAL
  Filled 2013-03-30: qty 1

## 2013-03-30 MED ORDER — AMLODIPINE BESYLATE 10 MG PO TABS
10.0000 mg | ORAL_TABLET | Freq: Every day | ORAL | Status: DC
  Administered 2013-03-30 – 2013-04-01 (×3): 10 mg via ORAL
  Filled 2013-03-30 (×3): qty 1

## 2013-03-30 MED ORDER — ACETAMINOPHEN 650 MG RE SUPP: 650.0000 mg | Freq: Four times a day (QID) | RECTAL | Status: DC | PRN

## 2013-03-30 MED ORDER — PANTOPRAZOLE SODIUM 40 MG PO TBEC
40.0000 mg | DELAYED_RELEASE_TABLET | Freq: Every day | ORAL | Status: DC
  Administered 2013-03-30 – 2013-04-01 (×3): 40 mg via ORAL
  Filled 2013-03-30 (×3): qty 1

## 2013-03-30 MED ORDER — DOCUSATE SODIUM 100 MG PO CAPS
100.0000 mg | ORAL_CAPSULE | Freq: Two times a day (BID) | ORAL | Status: DC | PRN
  Filled 2013-03-30: qty 1

## 2013-03-30 MED ORDER — PNEUMOCOCCAL VAC POLYVALENT 25 MCG/0.5ML IJ INJ
0.5000 mL | INJECTION | INTRAMUSCULAR | Status: AC
  Administered 2013-03-31: 0.5 mL via INTRAMUSCULAR
  Filled 2013-03-30: qty 0.5

## 2013-03-30 MED ORDER — BUDESONIDE-FORMOTEROL FUMARATE 160-4.5 MCG/ACT IN AERO
2.0000 | INHALATION_SPRAY | Freq: Two times a day (BID) | RESPIRATORY_TRACT | Status: DC
  Administered 2013-03-30: 2 via RESPIRATORY_TRACT
  Filled 2013-03-30: qty 6

## 2013-03-30 MED ORDER — ALBUTEROL SULFATE HFA 108 (90 BASE) MCG/ACT IN AERS: 2.0000 | INHALATION_SPRAY | RESPIRATORY_TRACT | Status: DC | PRN

## 2013-03-30 MED ORDER — ACETAMINOPHEN 325 MG PO TABS: 650.0000 mg | ORAL_TABLET | Freq: Four times a day (QID) | ORAL | Status: DC | PRN

## 2013-03-30 MED ORDER — METOCLOPRAMIDE HCL 10 MG PO TABS
10.0000 mg | ORAL_TABLET | Freq: Three times a day (TID) | ORAL | Status: DC
  Administered 2013-03-30 – 2013-04-01 (×7): 10 mg via ORAL
  Filled 2013-03-30 (×11): qty 1

## 2013-03-30 MED ORDER — LORATADINE 10 MG PO TABS
10.0000 mg | ORAL_TABLET | Freq: Every day | ORAL | Status: DC
  Administered 2013-03-30 – 2013-04-01 (×3): 10 mg via ORAL
  Filled 2013-03-30 (×4): qty 1

## 2013-03-30 MED ORDER — ARIPIPRAZOLE 5 MG PO TABS
5.0000 mg | ORAL_TABLET | Freq: Every day | ORAL | Status: DC
  Administered 2013-03-30 – 2013-03-31 (×2): 5 mg via ORAL
  Filled 2013-03-30 (×3): qty 1

## 2013-03-30 MED ORDER — ENOXAPARIN SODIUM 40 MG/0.4ML ~~LOC~~ SOLN: 40.0000 mg | SUBCUTANEOUS | Status: DC

## 2013-03-30 MED ORDER — NITROGLYCERIN 0.4 MG SL SUBL
0.4000 mg | SUBLINGUAL_TABLET | SUBLINGUAL | Status: AC | PRN
  Administered 2013-03-30 (×3): 0.4 mg via SUBLINGUAL
  Filled 2013-03-30 (×2): qty 25

## 2013-03-30 MED ORDER — FUROSEMIDE 10 MG/ML IJ SOLN
80.0000 mg | Freq: Two times a day (BID) | INTRAMUSCULAR | Status: DC
  Administered 2013-03-30: 80 mg via INTRAVENOUS
  Filled 2013-03-30 (×2): qty 8

## 2013-03-30 MED ORDER — CARVEDILOL 25 MG PO TABS
25.0000 mg | ORAL_TABLET | Freq: Two times a day (BID) | ORAL | Status: DC
  Administered 2013-03-30 – 2013-04-01 (×5): 25 mg via ORAL
  Filled 2013-03-30 (×7): qty 1

## 2013-03-30 MED ORDER — ONDANSETRON HCL 4 MG/2ML IJ SOLN: 4.0000 mg | Freq: Four times a day (QID) | INTRAMUSCULAR | Status: DC | PRN

## 2013-03-30 MED ORDER — FUROSEMIDE 10 MG/ML IJ SOLN
80.0000 mg | Freq: Once | INTRAMUSCULAR | Status: AC
  Administered 2013-03-30: 80 mg via INTRAVENOUS
  Filled 2013-03-30: qty 8

## 2013-03-30 MED ORDER — AMIODARONE HCL 200 MG PO TABS
200.0000 mg | ORAL_TABLET | Freq: Two times a day (BID) | ORAL | Status: DC
  Administered 2013-03-30 – 2013-04-01 (×5): 200 mg via ORAL
  Filled 2013-03-30 (×6): qty 1

## 2013-03-30 MED ORDER — ASPIRIN EC 81 MG PO TBEC
81.0000 mg | DELAYED_RELEASE_TABLET | Freq: Every day | ORAL | Status: DC
  Administered 2013-03-31 – 2013-04-01 (×2): 81 mg via ORAL
  Filled 2013-03-30 (×3): qty 1

## 2013-03-30 MED ORDER — ISOSORBIDE MONONITRATE ER 60 MG PO TB24
60.0000 mg | ORAL_TABLET | Freq: Every day | ORAL | Status: DC
  Administered 2013-03-30 – 2013-04-01 (×3): 60 mg via ORAL
  Filled 2013-03-30 (×3): qty 1

## 2013-03-30 MED ORDER — SODIUM CHLORIDE 0.9 % IJ SOLN
3.0000 mL | Freq: Two times a day (BID) | INTRAMUSCULAR | Status: DC
  Administered 2013-03-31 (×2): 3 mL via INTRAVENOUS

## 2013-03-30 MED ORDER — HYDROCODONE-ACETAMINOPHEN 5-325 MG PO TABS
1.0000 | ORAL_TABLET | ORAL | Status: DC | PRN
  Administered 2013-03-30 – 2013-04-01 (×3): 1 via ORAL
  Filled 2013-03-30 (×3): qty 1

## 2013-03-30 NOTE — ED Notes (Signed)
Pt reports shortness of breath since last night. Hx of CHF- swelling in bilateral legs. Also reports midsternal chest pain. Chest pain 8/10, intermittent sharp pain. Denies nausea/vomiting.

## 2013-03-30 NOTE — ED Notes (Signed)
Pt c/o mid sternal CP with radiation down right arm with SOB starting last night

## 2013-03-30 NOTE — ED Notes (Signed)
Patient transported to X-ray 

## 2013-03-30 NOTE — ED Notes (Signed)
Pt's weight was 157.5 when I weighed him 11:38 am JG.

## 2013-03-30 NOTE — ED Notes (Signed)
Chest Pain 8/10 before first nitro given.

## 2013-03-30 NOTE — ED Provider Notes (Signed)
History     CSN: [id number]  Arrival date & time 03/30/13  4098   First MD Initiated Contact with Patient 03/30/13 [phone]      Chief Complaint  Patient presents with  . Chest Pain  . Shortness of Breath    (Consider location/radiation/quality/duration/timing/severity/associated sxs/prior treatment) The history is provided by the patient.  Ryan Winters is a 52 y.o. male hx of bipolar, hypertension, schizophrenia, CHF, CAD status post CABG here presenting with shortness of breath. Shortness of breath worse at night over the last 2-3 days. He sometimes woke up in the middle of night short of breath. He also has some midsternal chest pain with radiation down to the right arm since yesterday. Didn't take anything for pain. He said he had previous admissions for CHF.    Past Medical History  Diagnosis Date  . Hypertension   . Bipolar disorder   . Schizophrenia     h/o hospitalized inpt therapy @ the [facility]  . Valvular heart disease     a. 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 b. 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  Marland Kitchen Hyperlipidemia   . H/O: suicide attempt 1998    was hospitalized at [facility]  . Anemia     Hx of iron infusions  . Gastroparesis   . Generalized headaches     sinus  . Arthritis   . Cocaine abuse     per pt none x 3 yrs  . Anxiety   . CHF (congestive heart failure)     a. EF 25-30% by echo 12/02/12, but 55% by TEE 12/04/12.  Marland Kitchen GERD (gastroesophageal reflux disease)   . Depression   . Vertigo   . Itchy skin   . Chronic kidney disease     has fistula, not on HD  . Atrial tachycardia     a. dx 11/2012: tx with amiodarone, cardioversion (discharge rhythm: likely junctional).    Past Surgical History  Procedure Laterality Date  . Mitral valve repair  05/2010  . Av fistula placement, radiocephalic  05/24/2011    Left arm  . Leg surgery    . Umbilical hernia repair  04/01/2012    Procedure: HERNIA REPAIR UMBILICAL  ADULT;  Surgeon: Ardeth Sportsman, MD;  Location: [facility] OR;  Service: General;  Laterality: N/A;  supra umbilical  . Tee without cardioversion  08/25/2012    Procedure: TRANSESOPHAGEAL ECHOCARDIOGRAM (TEE);  Surgeon: Wendall Stade, MD;  Location: [facility] ENDOSCOPY;  Service: Cardiovascular;  Laterality: N/A;  . Mitral valve replacement  09/23/2012    Procedure: REDO MITRAL VALVE REPLACEMENT (MVR);  Surgeon: [name], MD;  Location: [facility] OR;  Service: Open Heart Surgery;  Laterality: N/A;  Nitric Oxide  . Tricuspid valve replacement  09/23/2012    Procedure: TRICUSPID VALVE REPAIR;  Surgeon: [name], MD;  Location: [facility] OR;  Service: Open Heart Surgery;  Laterality: N/A;  . Tee without cardioversion  12/04/2012    Procedure: TRANSESOPHAGEAL ECHOCARDIOGRAM (TEE);  Surgeon: Laurey Morale, MD;  Location: [facility] ENDOSCOPY;  Service: Cardiovascular;  Laterality: N/A;  CV 1015/vicki, dl  . Cardioversion  4/78/2956    Procedure: CARDIOVERSION;  Surgeon: Laurey Morale, MD;  Location: [facility] ENDOSCOPY;  Service: Cardiovascular;  Laterality: N/A;    Family History  Problem Relation Age of Onset  . Hypertension Mother     History  Substance Use Topics  . Smoking status: Former Smoker -- 1.50 packs/day for 30 years  Types: Cigarettes    Quit date: 03/21/2011  . Smokeless tobacco: Never Used  . Alcohol Use: No     Comment: Quit alcohol in July of 2011      Review of Systems  Respiratory: Positive for shortness of breath.   Cardiovascular: Positive for chest pain.    Allergies  Cashew nut oil and Food  Home Medications   Current Outpatient Rx  Name  Route  Sig  Dispense  Refill  . albuterol (PROVENTIL HFA;VENTOLIN HFA) 108 (90 BASE) MCG/ACT inhaler   Inhalation   Inhale 2 puffs into the lungs every 4 (four) hours as needed. For shortness of breath or wheezing         . amiodarone (PACERONE) 200 MG tablet   Oral   Take 1 tablet (200 mg total) by mouth 2 (two) times daily.   60  tablet   1   . amLODipine (NORVASC) 10 MG tablet   Oral   Take 1 tablet (10 mg total) by mouth daily.   30 tablet   3   . ARIPiprazole (ABILIFY) 5 MG tablet   Oral   Take 5 mg by mouth daily.           . budesonide-formoterol (SYMBICORT) 160-4.5 MCG/ACT inhaler   Inhalation   Inhale 2 puffs into the lungs 2 (two) times daily.   1 Inhaler   1   . carvedilol (COREG) 25 MG tablet   Oral   Take 1 tablet (25 mg total) by mouth 2 (two) times daily with a meal.   60 tablet   3   . docusate sodium (COLACE) 100 MG capsule   Oral   Take 100 mg by mouth 2 (two) times daily as needed. For constipation         . furosemide (LASIX) 40 MG tablet   Oral   Take 1 tablet (40 mg total) by mouth daily.   30 tablet   3   . hydrALAZINE (APRESOLINE) 50 MG tablet   Oral   Take 1.5 tablets (75 mg total) by mouth every 8 (eight) hours.   135 tablet   3   . isosorbide mononitrate (IMDUR) 60 MG 24 hr tablet   Oral   Take 1 tablet (60 mg total) by mouth daily.   30 tablet   3   . metoCLOPramide (REGLAN) 10 MG tablet   Oral   Take 10 mg by mouth 4 (four) times daily.         Marland Kitchen omeprazole (PRILOSEC) 40 MG capsule   Oral   Take 40 mg by mouth 2 (two) times daily as needed. For heart burn           BP 187/110  Pulse 80  Temp(Src) 98.7 F (37.1 C) (Oral)  Resp 26  SpO2 99%  Physical Exam  Nursing note and vitals reviewed. Constitutional: He is oriented to person, place, and time. He appears well-developed and well-nourished.  HENT:  Head: Normocephalic.  Mouth/Throat: Oropharynx is clear and moist.  Eyes: Conjunctivae are normal. Pupils are equal, round, and reactive to light.  Neck: Normal range of motion. Neck supple.  Cardiovascular: Normal rate, regular rhythm and normal heart sounds.   Pulmonary/Chest: Effort normal.  Mild crackles bilateral bases no wheezing   Abdominal: Soft. Bowel sounds are normal. He exhibits no distension. There is no tenderness. There is  no rebound.  Musculoskeletal:  Trace edema bilaterally, no calf tenderness   Neurological: He is alert and oriented to  person, place, and time.  Skin: Skin is warm and dry.  Psychiatric: He has a normal mood and affect. His behavior is normal. Judgment and thought content normal.    ED Course  Procedures (including critical care time)  Labs Reviewed  CBC - Abnormal; Notable for the following:    Hemoglobin 11.1 (*)    HCT 34.4 (*)    MCV 76.3 (*)    MCH 24.6 (*)    RDW 15.8 (*)    All other components within normal limits  BASIC METABOLIC PANEL - Abnormal; Notable for the following:    Glucose, Bld 110 (*)    BUN 40 (*)    Creatinine, Ser 2.82 (*)    GFR calc non Af Amer 24 (*)    GFR calc Af Amer 28 (*)    All other components within normal limits  PRO B NATRIURETIC PEPTIDE - Abnormal; Notable for the following:    Pro B Natriuretic peptide (BNP) 13925.0 (*)    All other components within normal limits  URINE RAPID DRUG SCREEN (HOSP PERFORMED) - Abnormal; Notable for the following:    Cocaine POSITIVE (*)    All other components within normal limits  PROTIME-INR  POCT I-STAT TROPONIN I   Dg Chest 2 View  03/30/2013  *RADIOLOGY REPORT*  Clinical Data: Chest pain shortness of breath  CHEST - 2 VIEW  Comparison: 11/28/2012 and CT chest 11/29/2012  Findings: The cardiopericardial silhouette is enlarged.  The patient is status post cardiac valve replacement.  There is right base collapse / consolidation with small right pleural effusion.  IMPRESSION: No substantial change.  Right base collapse / consolidation with small effusion.   Original Report Authenticated By: Kennith Center, M.D.      1. CHF exacerbation      Date: 03/30/2013  Rate: 87  Rhythm: atrial fibrillation  QRS Axis: normal  Intervals: normal  ST/T Wave abnormalities: TWI inferior and lateral that is old  Conduction Disutrbances:none  Narrative Interpretation:   Old EKG Reviewed: unchanged    MDM   Ryan Winters is a 52 y.o. male here with SOB, CP. Likely ACS vs CHF exacerbation. Will get labs, BNP, CXR. I consulted [place] cards.   12:03 PM BNP elevated. Given lasix. Admit for CHF exacerbation to [place] cards.        Richardean Canal, MD 03/30/13 [phone]

## 2013-03-30 NOTE — Care Management Note (Signed)
    Page 1 of 1   03/31/2013     11:26:55 AM   CARE MANAGEMENT NOTE 03/31/2013  Patient:  Ryan Winters, Ryan Winters   Account Number:  [id number]  Date Initiated:  03/30/2013  Documentation initiated by:  GRAVES-BIGELOW,Inis Borneman  Subjective/Objective Assessment:   Pt admitted for SOB.     Action/Plan:   CM will discuss disposition needs and medication assistance with pt in am.   Anticipated DC Date:  04/01/2013   Anticipated DC Plan:  HOME W HOME HEALTH SERVICES  In-house referral  Financial Counselor      DC Planning Services  CM consult      Choice offered to / List presented to:             Status of service:  Completed, signed off Medicare Important Message given?   (If response is "NO", the following Medicare IM given date fields will be blank) Date Medicare IM given:   Date Additional Medicare IM given:    Discharge Disposition:  HOME/SELF CARE  Per UR Regulation:  Reviewed for med. necessity/level of care/duration of stay  If discussed at Long Length of Stay Meetings, dates discussed:    Comments:  03-30-13 1112 Tomi Bamberger, RN,BSN [phone] CM did speak to pt and he refuses a HHRN at this time. Pt has a scale at home. Pt wanted CM to get his medicaid reinstated. CM made him aware that he is to contact his SW at [facility] to get that started. CM did call the FC to touch base with the pt. No further needs from CM at this time.

## 2013-03-30 NOTE — H&P (Signed)
History and Physical  Patient ID: Ryan Winters MRN: [id number], SOB: Jan 09, 1961 52 y.o. Date of Encounter: 03/30/2013, 10:27 AM  Primary Physician: Dorrene German, MD Primary Cardiologist: Dr. Excell Seltzer  Chief Complaint: SOB  HPI: 52 y.o. 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  The patient notes a 1-week history of increasing DOE with walking across a room (at baseline can walk for 15 minutes), SOB, orthopnea requiring him to sleep upright (2-pillow orthopnea at baseline), PND 3x/night, and LE edema.  Last night around 12am, symptoms worsened, and the patient developed chest pain, described as a mid-sternal, dull, aching pain, accompanied by a "numb" pain down his right arm, associated with lightheadedness and SOB, though no nausea or diaphoresis.  The pain was initially a 10/10, and slowly decreased to an 8/10 when the patient arrived in the ED, and further decreased to a 6/10 after a sublingual nitro.  The patient continues to experience chest pain.  The patient notes no history of chest pain with exertion, and cardiac cath 05/26/10 showed no CAD.  The patient's last Echo in Jan 2014 shows an EF of 25-30%, though interestingly a TEE two days later showed an EF of 55%.  EKG revealed NSR with new TWI in leads V4-V6, I, and aVL. CXR showed a R base consolidation.  Labs were significant for a [place]-BNP [place] 409[address], and 1 negative troponin.   Past Medical History  Diagnosis Date  . Hypertension   . Bipolar disorder   . Schizophrenia     h/o hospitalized inpt therapy @ the [facility]  . Valvular heart disease     a. 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 b. 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  Marland Kitchen Hyperlipidemia   . H/O: suicide attempt 1998    was hospitalized at [facility]   . Anemia     Hx of iron infusions  . Gastroparesis   . Generalized headaches     sinus  . Arthritis   . Cocaine abuse     per pt none x 3 yrs  . Anxiety   . CHF (congestive heart failure)     a. EF 25-30% by echo 12/02/12, but 55% by TEE 12/04/12.  Marland Kitchen GERD (gastroesophageal reflux disease)   . Depression   . Vertigo   . Itchy skin   . Chronic kidney disease     has fistula, not on HD  . Atrial tachycardia     a. dx 11/2012: tx with amiodarone, cardioversion (discharge rhythm: likely junctional).     Surgical History:  Past Surgical History  Procedure Laterality Date  . Mitral valve repair  05/2010  . Av fistula placement, radiocephalic  05/24/2011    Left arm  . Leg surgery    . Umbilical hernia repair  04/01/2012    Procedure: HERNIA REPAIR UMBILICAL ADULT;  Surgeon: Ardeth Sportsman, MD;  Location: [facility] OR;  Service: General;  Laterality: N/A;  supra umbilical  . Tee without cardioversion  08/25/2012    Procedure: TRANSESOPHAGEAL ECHOCARDIOGRAM (TEE);  Surgeon: Wendall Stade, MD;  Location: [facility] ENDOSCOPY;  Service: Cardiovascular;  Laterality: N/A;  . Mitral valve replacement  09/23/2012    Procedure: REDO MITRAL VALVE REPLACEMENT (MVR);  Surgeon: [name], MD;  Location: [facility] OR;  Service: Open Heart Surgery;  Laterality: N/A;  Nitric Oxide  . Tricuspid valve replacement  09/23/2012    Procedure: TRICUSPID VALVE REPAIR;  Surgeon: [name], MD;  Location: [facility] OR;  Service: Open Heart Surgery;  Laterality: N/A;  . Tee without cardioversion  12/04/2012    Procedure: TRANSESOPHAGEAL ECHOCARDIOGRAM (TEE);  Surgeon: Laurey Morale, MD;  Location: [facility] ENDOSCOPY;  Service: Cardiovascular;  Laterality: N/A;  CV 1015/vicki, dl  . Cardioversion  1/61/0960    Procedure: CARDIOVERSION;  Surgeon: Laurey Morale, MD;  Location: [facility] ENDOSCOPY;  Service: Cardiovascular;  Laterality: N/A;     Home Meds: Prior to Admission medications   Medication Sig Start Date End Date Taking?  Authorizing Provider  albuterol (PROVENTIL HFA;VENTOLIN HFA) 108 (90 BASE) MCG/ACT inhaler Inhale 2 puffs into the lungs every 4 (four) hours as needed. For shortness of breath or wheezing 04/22/12 04/22/13 Yes Lyanne Co, MD  amiodarone (PACERONE) 200 MG tablet Take 1 tablet (200 mg total) by mouth 2 (two) times daily. 12/05/12  Yes Dayna N Dunn, PA-C  amLODipine (NORVASC) 10 MG tablet Take 1 tablet (10 mg total) by mouth daily. 12/05/12  Yes Dayna N Dunn, PA-C  ARIPiprazole (ABILIFY) 5 MG tablet Take 5 mg by mouth daily.     Yes Historical Provider, MD  budesonide-formoterol (SYMBICORT) 160-4.5 MCG/ACT inhaler Inhale 2 puffs into the lungs 2 (two) times daily. 09/30/12  Yes Erin Barrett, PA-C  carvedilol (COREG) 25 MG tablet Take 1 tablet (25 mg total) by mouth 2 (two) times daily with a meal. 12/05/12  Yes Dayna N Dunn, PA-C  docusate sodium (COLACE) 100 MG capsule Take 100 mg by mouth 2 (two) times daily as needed. For constipation   Yes Historical Provider, MD  furosemide (LASIX) 40 MG tablet Take 1 tablet (40 mg total) by mouth daily. 12/05/12  Yes Dayna N Dunn, PA-C  hydrALAZINE (APRESOLINE) 50 MG tablet Take 1.5 tablets (75 mg total) by mouth every 8 (eight) hours. 12/05/12  Yes Dayna N Dunn, PA-C  isosorbide mononitrate (IMDUR) 60 MG 24 hr tablet Take 1 tablet (60 mg total) by mouth daily. 12/05/12  Yes Dayna N Dunn, PA-C  metoCLOPramide (REGLAN) 10 MG tablet Take 10 mg by mouth 4 (four) times daily.   Yes Historical Provider, MD  omeprazole (PRILOSEC) 40 MG capsule Take 40 mg by mouth 2 (two) times daily as needed. For heart burn 03/25/12  Yes Ardeth Sportsman, MD    Allergies:  Allergies  Allergen Reactions  . Cashew Nut Oil Anaphylaxis  . Food Anaphylaxis    Strawberries, Cashews, causes throat to swell - affects breathing    History   Social History  . Marital Status: Married    Spouse Name: N/A    Number of Children: N/A  . Years of Education: N/A   Occupational History  .  disabled    Social History Main Topics  . Smoking status: Former Smoker -- 1.50 packs/day for 30 years    Types: Cigarettes    Quit date: 03/21/2011  . Smokeless tobacco: Never Used  . Alcohol Use: No     Comment: Quit alcohol in July of 2011  . Drug Use: No     Comment: None since November when had rectal bleeding and went to hospital  . Sexually Active: No   Other Topics Concern  . Not on file   Social History Narrative   09/09/2012   He lives with mother and is separated from wife. Daughter in [place] with 2 grandchildren. He denies  any cigs, drugs, alcohol at present.      Family History  Problem Relation Age of Onset  . Hypertension Mother     Review of Systems: General: negative for chills, fever, night sweats or weight changes.  ENT: +rhinorrhea x3 days Cardiovascular: see HPI Dermatological: negative for rash Respiratory: +cough productive of clear phlegm x3 days GI: negative for nausea, vomiting, diarrhea, bright red blood per rectum, melena, or hematemesis GU: no hematuria, urgency, or frequency Neurologic: negative for visual changes, syncope, headache, or dizziness Heme: no easy bruising or bleeding Endo: negative for excessive thirst, thyroid disorder, or flushing Musculoskeletal: negative for joint pain or swelling, negative for myalgias All other systems reviewed and are otherwise negative except as noted above.  Physical Exam: Blood pressure 160/97, pulse 82, temperature 97.8 F (36.6 C), temperature source Oral, resp. rate 24, SpO2 99.00%. General: Well developed, well nourished, alert and oriented, appears mildly uncomfortable HEENT: Normocephalic, atraumatic, sclera non-icteric, no xanthomas, nares are without discharge.  Neck: Supple. Carotids 2+ without bruits. Elevated JVP Lungs: Few ronchi in RLL, normal work of respiration. Heart: RRR with normal S1 and S2. S3 present. Grade II/VI early systolic murmur best heard at LLSB Abdomen: Soft,  non-tender, non-distended with normoactive bowel sounds. No hepatomegaly. No rebound/guarding. No obvious abdominal masses. Back: No CVA tenderness Msk:  Strength and tone appear normal for age. Extremities: No clubbing, cyanosis, or edema.  Distal pedal pulses are 2+ and equal bilaterally. Neuro: CNII-XII intact, moves all extremities spontaneously. Psych:  Responds to questions appropriately with a normal affect.   Labs:   Lab Results  Component Value Date   WBC 5.9 03/30/2013   HGB 11.1* 03/30/2013   HCT 34.4* 03/30/2013   MCV 76.3* 03/30/2013   PLT 173 03/30/2013   No results found for this basename: NA, K, CL, CO2, BUN, CREATININE, CALCIUM, LABALBU, PROT, BILITOT, ALKPHOS, ALT, AST, GLUCOSE,  in the last 168 hours No results found for this basename: CKTOTAL, CKMB, TROPONINI,  in the last 72 hours Lab Results  Component Value Date   CHOL  Value: 134        ATP III CLASSIFICATION:  <200     mg/dL   Desirable  161-096  mg/dL   Borderline High  >=045    mg/dL   High        4/0/9811   HDL 63 05/25/2010   LDLCALC  Value: 63        Total Cholesterol/HDL:CHD Risk Coronary Heart Disease Risk Table                     Men   Women  1/2 Average Risk   3.4   3.3  Average Risk       5.0   4.4  2 X Average Risk   9.6   7.1  3 X Average Risk  23.4   11.0        Use the calculated Patient Ratio above and the CHD Risk Table to determine the patient's CHD Risk.        ATP III CLASSIFICATION (LDL):  <100     mg/dL   Optimal  914-782  mg/dL   Near or Above                    Optimal  130-159  mg/dL   Borderline  956-213  mg/dL   High  >086     mg/dL   Very High 03/25/8468   TRIG  39 05/25/2010   Lab Results  Component Value Date   DDIMER 2.37* 09/23/2012    Radiology/Studies:  Dg Chest 2 View  03/30/2013  *RADIOLOGY REPORT*  Clinical Data: Chest pain shortness of breath  CHEST - 2 VIEW  Comparison: 11/28/2012 and CT chest 11/29/2012  Findings: The cardiopericardial silhouette is enlarged.  The patient is status  post cardiac valve replacement.  There is right base collapse / consolidation with small right pleural effusion.  IMPRESSION: No substantial change.  Right base collapse / consolidation with small effusion.   Original Report Authenticated By: Kennith Center, M.D.      EKG: new TWI in leads V4-V6, I, aVL  ASSESSMENT AND PLAN:  The patient is a 52 yo man, history of MV replacement and TV repair 09/2012, CHF, HTN, HL, CKD, atrial tachycardia, presenting with shortness of breath.  # Acute on Chronic CHF - the patient presents with DOE, orthopnea, PND, and elevated pro-BNP, consistent with acute on chronic CHF.  The patient's last EF by TEE 11/2012 was 55%, consistent with prior echo values, though of note a TTE 11/2012 did show an EF of 25-30%.  The patient was given IV lasix 80 x1 in the ED.  The patient's home dose of lasix is recorded at 40 mg PO daily in our system, though the patient believes he takes 80 mg BID. -daily weights, I/O's -lasix 80 mg IV BID -continue coreg, imdur, hydralazine  # Chest Pain - likely due to CHF exacerbation, per above.  Unlikely cardiac, given that cardiac catheterization showed no CAD in 2011.  Initial troponin has been negative.  CXR shows R base consolidation, which may represent atelectasis vs effusion.  No evidence for pneumonia (cough with only clear sputum, afebrile, no leukocytosis). -cycle troponins -will try incentive spirometry for RLL -treat CHF exacerbation per above -check UDS given remote history of cocaine use  # s/p Mitral Valve Replacement - the patient has a history of mitral regurg, s/p repair in 2011, though with recurrence of mitral regurg prompting MV bioprosthetic replacement 09/2012, along with tricuspid valve repair. -consider repeat echo to re-evaluate mitral valve function  # Atrial Tachycardia - The patient has a history of atrial tachycardia, currently on amiodarone.  The patient's HR is now in the 80's. -continue amiodarone, coreg  #  Hypertension - the patient's BP is elevated to 160/97, likely due to volume overload in the setting of CHF exacerbation -continue lasix, coreg, imdur, hydralazine per above -continue amlodipine  # CKD - the patient has a history of stage IV CKD.  Creatinine is currently at patient's baseline. -follow daily BMET's  Signed, Janalyn Harder MD 03/30/2013, 10:27 AM  Patient seen and examined. I agree with the assessment and plan as detailed above. See also my additional thoughts below.   I have examined the patient and carefully reviewed data including old and current EKGs. I agree with the excellent note above by Dr. Manson Passey. I've discussed the plan of therapy with Dr. Manson Passey.    Acute on chronic diastolic CHF (congestive heart failure),      The patient is currently volume overloaded with shortness of breath and chest pain. He has diastolic heart failure. It is not clear what other components are playing a role. He is not having any tachycardia. His rate is slow but I doubt the bradycardia is causing the problem. Today in the emergency room his blood pressure is as high as 175/110. We will have to see what his pressure is when he  diuresis. It may be that he has ongoing significant hypertension causing a flare of his heart failure. Meds are being adjusted at this time. He is already diuresing.    Hypertension     His hypertension is a significant issue this morning. Careful attention will have to be paid to being sure that we keep him normotensive.    CKD (chronic kidney disease), stage IV      His renal function is at baseline today.    Atypical chest pain     He presented with chest pain today. So far troponins are normal. Cardiac catheterization before his original mitral valve repair in 2011 revealed no significant coronary disease.    S/P mitral valve replacement     The patient had a mitral valve repair and then more recently had mitral valve replacement with a tissue mitral valve. TEE in  January, 2014 reveal that the valve is working well. His EF at that time was in the range of 55%. We need to reassess his valve and LV function this admission.    S/P tricuspid valve repair     He had tricuspid valve repair when he had his mitral valve replaced.    Ectopic atrial rhythm    It is not clear to me what his rhythm is. There is mention that after his cardioversion in January, 2014, that his underlying rhythm may have been junctional or an ectopic atrial rhythm. At that time the PR interval was very short period the EKGs today are difficult to interpret. Another will be repeated. I suspect that he has the same ongoing rhythm with a short PR interval.   Willa Rough, MD, [facility] 03/30/2013 12:26 PM

## 2013-03-30 NOTE — Progress Notes (Signed)
UR Completed Gwendolin Briel Graves-Bigelow, RN,BSN [phone]  

## 2013-03-31 ENCOUNTER — Encounter (HOSPITAL_COMMUNITY): Payer: Self-pay | Admitting: Nurse Practitioner

## 2013-03-31 DIAGNOSIS — I059 Rheumatic mitral valve disease, unspecified: Secondary | ICD-10-CM

## 2013-03-31 DIAGNOSIS — I509 Heart failure, unspecified: Secondary | ICD-10-CM

## 2013-03-31 LAB — COMPREHENSIVE METABOLIC PANEL
AST: 21 U/L (ref 0–37)
Albumin: 2.8 g/dL — ABNORMAL LOW (ref 3.5–5.2)
Chloride: 100 mEq/L (ref 96–112)
Creatinine, Ser: 3.42 mg/dL — ABNORMAL HIGH (ref 0.50–1.35)
Potassium: 3.6 mEq/L (ref 3.5–5.1)
Total Bilirubin: 0.6 mg/dL (ref 0.3–1.2)
Total Protein: 6.3 g/dL (ref 6.0–8.3)

## 2013-03-31 LAB — CBC
HCT: 31.3 % — ABNORMAL LOW (ref 39.0–52.0)
MCH: 24.8 pg — ABNORMAL LOW (ref 26.0–34.0)
MCV: 75.4 fL — ABNORMAL LOW (ref 78.0–100.0)
Platelets: 155 10*3/uL (ref 150–400)
RBC: 4.15 MIL/uL — ABNORMAL LOW (ref 4.22–5.81)

## 2013-03-31 NOTE — Progress Notes (Signed)
Clinical Social Work Department BRIEF PSYCHOSOCIAL ASSESSMENT 03/31/2013  Patient:  Ryan Winters, Ryan Winters     Account Number:  [id number]     Admit date:  03/30/2013  Clinical Social Worker:  Hattie Perch  Date/Time:  03/31/2013 12:00 M  Referred by:  Physician  Date Referred:  03/31/2013 Referred for  Substance Abuse   Other Referral:   Interview type:  Patient Other interview type:    PSYCHOSOCIAL DATA Living Status:  ALONE Admitted from facility:   Level of care:   Primary support name:  Blease Capaldi Primary support relationship to patient:  PARENT Degree of support available:   poor    CURRENT CONCERNS Current Concerns  Substance Abuse   Other Concerns:    SOCIAL WORK ASSESSMENT / PLAN CSW met with patient. patient is alert and oriented X3. patient states that he "smoked a rock" prior to coming into the hospital. CSW asked if he wanted any information on outpatient or inpatient substance abuse resources. Patient states "no, i think i will stop selling it so i will stop using it." he declines any more CSW needs.   Assessment/plan status:   Other assessment/ plan:   Information/referral to community resources:    PATIENT'S/FAMILY'S RESPONSE TO PLAN OF CARE: refuses resources. no further CSW needs noted.

## 2013-03-31 NOTE — Progress Notes (Signed)
  Echocardiogram 2D Echocardiogram has been performed.  Briseida Gittings 03/31/2013, 8:50 AM

## 2013-03-31 NOTE — Progress Notes (Signed)
Patient Name: Ryan Winters Date of Encounter: 03/31/2013   Principal Problem:   Acute on chronic diastolic CHF (congestive heart failure) Active Problems:   Substance abuse   Hypertension   CKD (chronic kidney disease), stage IV   Atypical chest pain   Schizophrenia   S/P mitral valve replacement with triv MR and w/o signif stenosis by TEE 11/2012   S/P tricuspid valve repair with triv TR by TEE 11/2012   Ectopic atrial rhythm - s/p DCCV for atach/atyp atrial flutter 11/2012.    SUBJECTIVE  Feels well this AM.  Ambulating without dyspnea.  Slept well last night w/o orthopnea.  Despite net negative of 1620 since admission, weight is unchanged at 157 lbs this AM, and up from previous dry weights between 145 and 150.  Creat up 3.42.  BP better.    CURRENT MEDS . amiodarone  200 mg Oral BID  . amLODipine  10 mg Oral Daily  . ARIPiprazole  5 mg Oral Daily  . aspirin EC  81 mg Oral Daily  . budesonide-formoterol  2 puff Inhalation BID  . carvedilol  25 mg Oral BID WC  . enoxaparin (LOVENOX) injection  30 mg Subcutaneous Q24H  . furosemide  80 mg Intravenous BID  . hydrALAZINE  75 mg Oral Q8H  . isosorbide mononitrate  60 mg Oral Daily  . loratadine  10 mg Oral Daily  . metoCLOPramide  10 mg Oral TID PC & HS  . pantoprazole  40 mg Oral Daily  . pneumococcal 23 valent vaccine  0.5 mL Intramuscular Tomorrow-1000  . sodium chloride  3 mL Intravenous Q12H    OBJECTIVE  Filed Vitals:   03/30/13 1317 03/30/13 1404 03/30/13 2039 03/31/13 0529  BP: 108/67 107/68 113/63 107/63  Pulse: 63 69 77 57  Temp: 97.5 F (36.4 C) 98.6 F (37 C) 98.2 F (36.8 C) 98.2 F (36.8 C)  TempSrc: Oral Oral Oral Oral  Resp: 20 20 18 17   Height: 5\' 5"  (1.651 m)     Weight: 157 lb (71.215 kg)   157 lb 14.4 oz (71.623 kg)  SpO2: 98% 99% 98% 99%    Intake/Output Summary (Last 24 hours) at 03/31/13 0807 Last data filed at 03/30/13 2042  Gross per 24 hour  Intake    480 ml  Output   2100 ml   Net  -1620 ml   Filed Weights   03/30/13 1317 03/31/13 0529  Weight: 157 lb (71.215 kg) 157 lb 14.4 oz (71.623 kg)    PHYSICAL EXAM  General: Pleasant, NAD. Neuro: Alert and oriented X 3. Moves all extremities spontaneously. Psych: Normal affect. HEENT:  Normal  Neck: Supple without bruits.  JVP ~ 10 cm. Lungs:  Resp regular and unlabored, CTA. Heart: RRR no s3, + s4, 3/6 syst murmur, loudest @ llsb, softer apical systolic murmur also present. Abdomen: Soft, non-tender, non-distended, BS + x 4.  Extremities: No clubbing, cyanosis or edema. DP/PT/Radials 2+ and equal bilaterally.  Accessory Clinical Findings  CBC  Recent Labs  03/30/13 0913 03/31/13 0455  WBC 5.9 5.1  HGB 11.1* 10.3*  HCT 34.4* 31.3*  MCV 76.3* 75.4*  PLT 173 155   Basic Metabolic Panel  Recent Labs  03/30/13 0913 03/31/13 0455  NA 135 138  K 3.9 3.6  CL 103 100  CO2 22 27  GLUCOSE 110* 95  BUN 40* 47*  CREATININE 2.82* 3.42*  CALCIUM 8.8 8.9   Liver Function Tests  Recent Labs  03/31/13 0455  AST 21  ALT 20  ALKPHOS 162*  BILITOT 0.6  PROT 6.3  ALBUMIN 2.8*   Cardiac Enzymes  Recent Labs  03/30/13 1522 03/30/13 2205  TROPONINI <0.30 <0.30   UDS + cocaine  TELE  Ectopic atrial rhythm with very short PR interval.  ECG  Ectopic atrial rhythm, lvh with inferolateral twi.  Radiology/Studies  Dg Chest 2 View  03/30/2013  *RADIOLOGY REPORT*  Clinical Data: Chest pain shortness of breath  CHEST - 2 VIEW  Comparison: 11/28/2012 and CT chest 11/29/2012  Findings: The cardiopericardial silhouette is enlarged.  The patient is status post cardiac valve replacement.  There is right base collapse / consolidation with small right pleural effusion.  IMPRESSION: No substantial change.  Right base collapse / consolidation with small effusion.   Original Report Authenticated By: Kennith Center, M.D.     ASSESSMENT AND PLAN  1.  Acute on chronic diastolic chf:  Pt presented with a 1 wk  h/o progressive dyspnea, orthopnea, pnd, and edema, elevated pBNP and marked hypertension.  He diuresed well yesterday, though this is not reflected in his weight.  With diuresis and BP control, he is clinically much improved and ambulating without any dyspnea.  He is euvolemic on exam.  His creatinine bumped significantly to 3.42 after receiving IV lasix this AM.  Echo just done and read pending.  Last documented EF by TEE was nl (11/2012).  He says that he has been taking his meds @ home.  Hold lasix today.  May need to give back some fluid.  2.  Substance abuse:  Unfortunately, he continues to "handle" cocaine, saying that he does not use it per se but that he does cut it and deal it.  He lost his medicaid and says that he's gone to dealing in order to pay his bills and buy his meds.  He realizes that it's presence in his tox screen indicates that he is being affected by it's handling.  Cessation advised and he says that he's done dealing.  3.  Chest pain:  Troponin's nl.  ECG w/ inferolateral twi in setting of LVH.  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  4.  HTN:  Suspect this may have been driving force in Ss considering how quickly he's turned around with BP control.  ? Compliance with meds @ home.  5.  Atrial Tachycardia/ectopic atrial rhythm:  S/p DCCV in January.  Per d/c summary @ that time, he was to be on xarelto.  He thinks that he has this at home but does not regularly take it.  A decision was to be made at outpt f/u as to how long to continue anticoagulation, though he never followed up.  Cont amio/bb.  Consider reduction of amio to daily dosing.  6.  Acute on chronic stage IV ckd:  Creat bumped with diuresis.  Hold lasix.  Follow.  If renal fxn worsens->renal c/s.  Signed, Nicolasa Ducking NP  History and all data above reviewed.  Patient examined.  I agree with the findings as above.  He is breathing at baseline. He is breathing  The patient exam reveals COR:RRR  ,  Lungs: Clear  ,  Abd:  Positive bowel sounds, no rebound no guarding, Ext No edema  .  All available labs, radiology testing, previous records reviewed. Agree with documented assessment and plan. I agree with holding Lasix today.  Probable discharge in AM. Most of this centers around education and compliance.  I will talk to case worker  about a possible [facility] role.  Fayrene Fearing Rilei Kravitz  11:27 AM  03/31/2013

## 2013-04-01 DIAGNOSIS — I5043 Acute on chronic combined systolic (congestive) and diastolic (congestive) heart failure: Secondary | ICD-10-CM

## 2013-04-01 DIAGNOSIS — Z9119 Patient's noncompliance with other medical treatment and regimen: Secondary | ICD-10-CM

## 2013-04-01 DIAGNOSIS — Z91199 Patient's noncompliance with other medical treatment and regimen due to unspecified reason: Secondary | ICD-10-CM

## 2013-04-01 LAB — BASIC METABOLIC PANEL
BUN: 48 mg/dL — ABNORMAL HIGH (ref 6–23)
CO2: 25 mEq/L (ref 19–32)
Calcium: 8.7 mg/dL (ref 8.4–10.5)
Creatinine, Ser: 3.7 mg/dL — ABNORMAL HIGH (ref 0.50–1.35)
Glucose, Bld: 171 mg/dL — ABNORMAL HIGH (ref 70–99)

## 2013-04-01 MED ORDER — ASPIRIN 81 MG PO TBEC: 81.0000 mg | DELAYED_RELEASE_TABLET | Freq: Every day | ORAL | Status: DC

## 2013-04-01 MED ORDER — AMLODIPINE BESYLATE 10 MG PO TABS: 10.0000 mg | ORAL_TABLET | Freq: Every day | ORAL | Status: DC

## 2013-04-01 MED ORDER — FUROSEMIDE 40 MG PO TABS: 40.0000 mg | ORAL_TABLET | Freq: Every day | ORAL | Status: DC

## 2013-04-01 MED ORDER — HYDRALAZINE HCL 50 MG PO TABS: 75.0000 mg | ORAL_TABLET | Freq: Three times a day (TID) | ORAL | Status: DC

## 2013-04-01 MED ORDER — AMIODARONE HCL 200 MG PO TABS: 200.0000 mg | ORAL_TABLET | Freq: Every day | ORAL | Status: DC

## 2013-04-01 MED ORDER — CARVEDILOL 25 MG PO TABS: 25.0000 mg | ORAL_TABLET | Freq: Two times a day (BID) | ORAL | Status: DC

## 2013-04-01 MED ORDER — ISOSORBIDE MONONITRATE ER 60 MG PO TB24: 60.0000 mg | ORAL_TABLET | Freq: Every day | ORAL | Status: DC

## 2013-04-01 NOTE — Progress Notes (Signed)
    Subjective:  No chest pain or dyspnea. Feels well this am. He admits to selling crack to pay for his prescription meds. Says he tested positive for cocaine because of 'handling' crack. He lost his Medicaid.  Objective:  Vital Signs in the last 24 hours: Temp:  [97.8 F (36.6 C)-98.4 F (36.9 C)] 97.8 F (36.6 C) (05/14 0535) Pulse Rate:  [54-71] 66 (05/14 0800) Resp:  [16] 16 (05/13 1358) BP: (106-135)/(53-68) 114/55 mmHg (05/14 0800) SpO2:  [97 %-98 %] 98 % (05/14 0535) Weight:  [73.936 kg (163 lb)] 73.936 kg (163 lb) (05/14 0535)  Intake/Output from previous day: 05/13 0701 - 05/14 0700 In: 3 [I.V.:3] Out: 925 [Urine:925]  Physical Exam: Pt is alert and oriented, NAD HEENT: normal Neck: JVP - normal Lungs: CTA bilaterally CV: RRR with grade 2/6 systolic murmur at the LSB and an S4 gallop Abd: soft, NT, Positive BS, no hepatomegaly Ext: no C/C/E, distal pulses intact and equal Skin: warm/dry no rash   Lab Results:  Recent Labs  03/30/13 0913 03/31/13 0455  WBC 5.9 5.1  HGB 11.1* 10.3*  PLT 173 155    Recent Labs  03/31/13 0455 04/01/13 0434  NA 138 136  K 3.6 3.7  CL 100 101  CO2 27 25  GLUCOSE 95 171*  BUN 47* 48*  CREATININE 3.42* 3.70*    Recent Labs  03/30/13 1522 03/30/13 2205  TROPONINI <0.30 <0.30    Cardiac Studies: 2D Echo: Left ventricle: The cavity size was normal. Wall thickness was increased in a pattern of moderate LVH. Systolic function was normal. The estimated ejection fraction was in the range of 50% to 55%. Wall motion was normal; there were no regional wall motion abnormalities.  ------------------------------------------------------------ Aortic valve: Structurally normal valve. Trileaflet. Cusp separation was normal. Doppler: Transvalvular velocity was within the normal range. There was no stenosis. No regurgitation.  ------------------------------------------------------------ Aorta: The aorta was normal, not  dilated, and non-diseased.  ------------------------------------------------------------ Mitral valve: A bioprosthesis was present and functioning normally. Doppler: Mild regurgitation. Valve area by pressure half-time: 2.56cm^2. Indexed valve area by pressure half-time: 1.4cm^2/m^2. Mean gradient: 7mm Hg (D). Peak gradient: 25mm Hg (D).  ------------------------------------------------------------ Left atrium: The atrium was mildly to moderately dilated.  ------------------------------------------------------------ Right ventricle: The cavity size was mildly dilated. Systolic function was normal.  ------------------------------------------------------------ Pulmonic valve: Structurally normal valve. Cusp separation was normal. Doppler: Transvalvular velocity was within the normal range. No regurgitation.  ------------------------------------------------------------ Tricuspid valve: Prior procedures included surgical repair. Leaflet separation was normal. Doppler: Transvalvular velocity was within the normal range. Mild regurgitation. Mean gradient: 4mm Hg (D). Peak gradient: 11mm Hg (D).  ------------------------------------------------------------ Right atrium: The atrium was mildly dilated.  ------------------------------------------------------------ Pericardium: The pericardium was normal in appearance.  ------------------------------------------------------------ Post procedure conclusions Ascending Aorta:  - The aorta was normal, not dilated, and non-diseased.  Tele: Sinus rhythm, personally reviewed  Assessment/Plan:  1. 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 Substance abuse 3. 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 Medical noncompliance  Main issue relates to medical noncompliance/drug use. He is working on getting his medicaid back. Doing fine now that he's back on meds. Meds reviewed and would discharge  on same program. He needs follow-up scheduled with me and Dr Arrie Aran. thx  Tonny Bollman, M.D. 04/01/2013, 10:12 AM

## 2013-04-01 NOTE — Progress Notes (Signed)
Discharge Note: Pt is alert and oriented, VS are stable, denies CP.  Telemetry & IV discontinued.  Discharge instructions reviewed with patient, pt verbalizes understanding.  Wheelchair transportation provided, all belongings with patient  

## 2013-04-01 NOTE — Discharge Summary (Signed)
Patient ID: Ryan Winters,  MRN: [id number], DOB/AGE: Mar 08, 1961 52 y.o.  Admit date: 03/30/2013 Discharge date: 04/01/2013  Primary Care Provider: Fleet Contras A Primary Cardiologist: Judie Petit. Excell Seltzer, MD  Discharge Diagnoses Principal Problem:   Acute on chronic diastolic CHF (congestive heart failure)  **In setting of noncompliance, -2.2 L for this admission.  **EF 50-55% by echo this admission.  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   Atypical chest pain  **H/O normal cors.   Schizophrenia   S/P mitral valve replacement   S/P tricuspid valve repair   Ectopic atrial rhythm  Allergies Allergies  Allergen Reactions  . Cashew Nut Oil Anaphylaxis  . Food Anaphylaxis    Strawberries, Cashews, causes throat to swell - affects breathing   Procedures  2D Echocardiogram 5.13.2014  Study Conclusions  - Left ventricle: The cavity size was normal. Wall thickness   was increased in a pattern of moderate LVH. Systolic   function was normal. The estimated ejection fraction was   in the range of 50% to 55%. Wall motion was normal; there   were no regional wall motion abnormalities. - Mitral valve: A bioprosthesis was present and functioning   normally. Mild regurgitation. - Left atrium: The atrium was mildly to moderately dilated. - Right ventricle: The cavity size was mildly dilated. - Right atrium: The atrium was mildly dilated. - Tricuspid valve: Prior procedures included surgical   repair. - Pulmonary arteries: PA peak pressure: 46mm Hg (S). _____________  History of Present Illness  52 y/o male with the above complex problem list.  He presented to the [facility] ED with a 1 week history of progressive dyspnea, orthopnea, PND, and lower extremity edema.  On the morning of admission, symptoms worsened an were  associated with chest discomfort and lightheadedness.  In the ED, his troponin was normal however proBNP was elevated @ 13, 925.  ECG showed lateral TWI and he was hypertensive with blood pressures in the 160's.  He was felt to have volume overload on exam and was admitted for further evaluation and management of acute on chronic diastolic chf.  Hospital Course  With IV lasix, pt briskly diuresed 2 Liters over the first 24 hrs of admission.  His previously listed home meds were also resumed and his blood pressure was well-controlled, raising the question of compliance at home.  In the setting of diuresis, pts creatinine elevated from 2.82 on admission to 3.70 this morning.  His lasix has been held for the past 24 hrs and a lower dose of oral lasix will be resumed at discharge.  We have counseled him on the importance of medication compliance and cocaine avoidance.  He reports that he does not use cocaine, but that he does handle/cut it and deal it.  His urine tox screen was positive for cocaine.  He will be discharged home today in good condition.  We have arrange for both nephrology and cardiology follow-up next week.  Discharge Vitals Blood pressure 114/55, pulse 66, temperature 97.8 F (36.6 C), temperature source Oral, resp. rate 16, height 5\' 5"  (1.651 m), weight 163 lb (73.936 kg), SpO2 98.00%.  Filed Weights   03/30/13 1317 03/31/13 0529 04/01/13 0535  Weight: 157 lb (71.215 kg) 157 lb 14.4 oz (71.623 kg) 163 lb (73.936 kg)    Labs  CBC  Recent Labs  03/30/13 0913  03/31/13 0455  WBC 5.9 5.1  HGB 11.1* 10.3*  HCT 34.4* 31.3*  MCV 76.3* 75.4*  PLT 173 155   Basic Metabolic Panel  Recent Labs  03/31/13 0455 04/01/13 0434  NA 138 136  K 3.6 3.7  CL 100 101  CO2 27 25  GLUCOSE 95 171*  BUN 47* 48*  CREATININE 3.42* 3.70*  CALCIUM 8.9 8.7   Liver Function Tests  Recent Labs  03/31/13 0455  AST 21  ALT 20  ALKPHOS 162*  BILITOT 0.6  PROT 6.3  ALBUMIN 2.8*    Cardiac Enzymes  Recent Labs  03/30/13 1522 03/30/13 2205  TROPONINI <0.30 <0.30   Disposition  Pt is being discharged home today in good condition.  Follow-up Plans & Appointments      Follow-up Information   Follow up with ZEYFANG,DAVID W, PA-C On 04/08/2013. (2;30 PM - Dr. Hadley Pen PA)    Contact information:   [address] [place] [address] [phone]       Follow up with Tonny Bollman, MD On 04/08/2013. (4:00)    Contact information:   [address] [place] [address] [phone]      Discharge Medications    Medication List    TAKE these medications       ABILIFY 5 MG tablet  Generic drug:  ARIPiprazole  Take 5 mg by mouth daily.     albuterol 108 (90 BASE) MCG/ACT inhaler  Commonly known as:  PROVENTIL HFA;VENTOLIN HFA  Inhale 2 puffs into the lungs every 4 (four) hours as needed. For shortness of breath or wheezing     amiodarone 200 MG tablet  Commonly known as:  PACERONE  Take 1 tablet (200 mg total) by mouth daily.     amLODipine 10 MG tablet  Commonly known as:  NORVASC  Take 1 tablet (10 mg total) by mouth daily.     aspirin 81 MG EC tablet  Take 1 tablet (81 mg total) by mouth daily.     budesonide-formoterol 160-4.5 MCG/ACT inhaler  Commonly known as:  SYMBICORT  Inhale 2 puffs into the lungs 2 (two) times daily.     carvedilol 25 MG tablet  Commonly known as:  COREG  Take 1 tablet (25 mg total) by mouth 2 (two) times daily with a meal.     docusate sodium 100 MG capsule  Commonly known as:  COLACE  Take 100 mg by mouth 2 (two) times daily as needed. For constipation     furosemide 40 MG tablet  Commonly known as:  LASIX  Take 1 tablet (40 mg total) by mouth daily.     hydrALAZINE 50 MG tablet  Commonly known as:  APRESOLINE  Take 1.5 tablets (75 mg total) by mouth every 8 (eight) hours.     isosorbide mononitrate 60 MG 24 hr tablet  Commonly known as:  IMDUR  Take 1 tablet (60 mg  total) by mouth daily.     metoCLOPramide 10 MG tablet  Commonly known as:  REGLAN  Take 10 mg by mouth 4 (four) times daily.     omeprazole 40 MG capsule  Commonly known as:  PRILOSEC  Take 40 mg by mouth 2 (two) times daily as needed. For heart burn       Outstanding Labs/Studies  Will need BMET upon f/u.  Duration of Discharge Encounter   Greater than 30 minutes including physician time.  Signed, Nicolasa Ducking NP 04/01/2013, 11:43 AM

## 2013-04-02 ENCOUNTER — Other Ambulatory Visit (INDEPENDENT_AMBULATORY_CARE_PROVIDER_SITE_OTHER): Payer: Self-pay | Admitting: Surgery

## 2013-04-03 ENCOUNTER — Telehealth: Payer: Self-pay | Admitting: Nurse Practitioner

## 2013-04-03 NOTE — Telephone Encounter (Signed)
Patient contacted regarding discharge from hospital on 04/01/13.  Patient understands to follow up with provider Dr. Excell Seltzer on 04/08/13 at 4:00 at [facility]. Patient understands discharge instructions? Yes Patient understands medications and regiment? Yes Patient understands to bring all medications to this visit? Yes  Patient states he is doing well, taking it easy and is following all instructions. Patient states his medications are too expensive.  Patient states he has enough to take until he sees Dr. Excell Seltzer on Wed. 5/21 and can discuss with him at that time.

## 2013-04-08 ENCOUNTER — Ambulatory Visit: Payer: Self-pay | Admitting: Cardiovascular Disease

## 2013-04-15 ENCOUNTER — Ambulatory Visit: Payer: Self-pay | Admitting: Nurse Practitioner

## 2013-04-22 DIAGNOSIS — Z0271 Encounter for disability determination: Secondary | ICD-10-CM

## 2013-04-23 ENCOUNTER — Other Ambulatory Visit (INDEPENDENT_AMBULATORY_CARE_PROVIDER_SITE_OTHER): Payer: Self-pay | Admitting: Surgery

## 2013-04-24 ENCOUNTER — Telehealth (INDEPENDENT_AMBULATORY_CARE_PROVIDER_SITE_OTHER): Payer: Self-pay

## 2013-04-24 NOTE — Telephone Encounter (Signed)
Pt called wanting refill of his medication for gastroparesis. I advised pt he needs to call Dr Concepcion Elk for his medication needs. Pt given phone #.

## 2013-05-14 ENCOUNTER — Encounter: Payer: Self-pay | Admitting: Cardiology

## 2013-06-09 ENCOUNTER — Encounter: Payer: Self-pay | Admitting: Internal Medicine

## 2013-06-10 ENCOUNTER — Other Ambulatory Visit: Payer: Self-pay | Admitting: Internal Medicine

## 2013-06-10 ENCOUNTER — Encounter: Payer: Self-pay | Admitting: Internal Medicine

## 2013-06-10 ENCOUNTER — Ambulatory Visit (INDEPENDENT_AMBULATORY_CARE_PROVIDER_SITE_OTHER): Payer: Medicaid Other | Admitting: Internal Medicine

## 2013-06-10 VITALS — BP 177/90 | Temp 98.1°F | Ht 68.0 in | Wt 152.2 lb

## 2013-06-10 DIAGNOSIS — R109 Unspecified abdominal pain: Secondary | ICD-10-CM

## 2013-06-10 DIAGNOSIS — I059 Rheumatic mitral valve disease, unspecified: Secondary | ICD-10-CM

## 2013-06-10 DIAGNOSIS — K219 Gastro-esophageal reflux disease without esophagitis: Secondary | ICD-10-CM

## 2013-06-10 DIAGNOSIS — K3184 Gastroparesis: Secondary | ICD-10-CM

## 2013-06-10 DIAGNOSIS — N039 Chronic nephritic syndrome with unspecified morphologic changes: Secondary | ICD-10-CM

## 2013-06-10 DIAGNOSIS — N184 Chronic kidney disease, stage 4 (severe): Secondary | ICD-10-CM

## 2013-06-10 DIAGNOSIS — I5043 Acute on chronic combined systolic (congestive) and diastolic (congestive) heart failure: Secondary | ICD-10-CM

## 2013-06-10 DIAGNOSIS — I10 Essential (primary) hypertension: Secondary | ICD-10-CM

## 2013-06-10 DIAGNOSIS — D631 Anemia in chronic kidney disease: Secondary | ICD-10-CM

## 2013-06-10 DIAGNOSIS — I509 Heart failure, unspecified: Secondary | ICD-10-CM

## 2013-06-10 DIAGNOSIS — I340 Nonrheumatic mitral (valve) insufficiency: Secondary | ICD-10-CM

## 2013-06-10 MED ORDER — FUROSEMIDE 40 MG PO TABS: 40.0000 mg | ORAL_TABLET | Freq: Two times a day (BID) | ORAL | Status: DC

## 2013-06-10 MED ORDER — ISOSORBIDE MONONITRATE ER 60 MG PO TB24: 60.0000 mg | ORAL_TABLET | Freq: Every day | ORAL | Status: DC

## 2013-06-10 MED ORDER — TRAMADOL HCL 50 MG PO TABS: 50.0000 mg | ORAL_TABLET | Freq: Three times a day (TID) | ORAL | Status: DC | PRN

## 2013-06-10 NOTE — Progress Notes (Signed)
Subjective:   Patient ID: Ryan Winters male   DOB: 06/16/1961 52 y.o.   MRN: [id number]  HPI: Mr.Case Ryan Winters is a 52 y.o. male with a complicated pmhx included below who comes to the clinic today to establish care. The patient has recently not had a PCP 2/2 losing medicaid benefits. He has resorted to going to the ED for his medications. He is motivated to establish care with our clinic to help him coordinate and manage his care.   The patient ran out of Zofran last week, which he takes for gastroparesis. He has had 3 episodes of vomiting this week. He is able to keep food down, but is having significant pain. His pain is 7/10 and constant. He is able to eat, but 30 minutes after eating he has pain and nausea. The episodes are similar to previous episodes of gastroparesis that he has had.    The patient states that he has increased shortness of breath over the last week. He describes orthopnea. He denies LE swelling. He states that he was given an inhaler (Symbicort) during his last ED visit which helped him with his SOB. However, he recently ran out of this medication.  Of note, the patient reports that he sometimes feels like he would be better off dead. However, he denies HI/SI. The patient has no plan to hurt himself and contracts for safety.    Past Medical History  Diagnosis Date  . Hypertension   . Bipolar disorder   . Schizophrenia     h/o hospitalized inpt therapy @ the [facility]  . Valvular heart disease     a. 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 b. 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  Marland Kitchen Hyperlipidemia   . H/O: suicide attempt 1998    was hospitalized at [facility]  . Anemia     Hx of iron infusions  . Gastroparesis   . Arthritis   . Cocaine abuse     per pt none x 3 yrs however continues to "handle" it and deal it with + UDS.  Marland Kitchen Anxiety   . CHF (congestive heart failure)     a. EF 25-30% by echo 12/02/12, but 55% by TEE  12/04/12.  Marland Kitchen GERD (gastroesophageal reflux disease)   . Depression   . Vertigo   . Itchy skin   . Peripheral neuropathy     BLE  . Bilateral lower abdominal cramping   . Shortness of breath     "periodically; happens at any time" (03/30/13)  . Generalized headaches     "monthly" (03/30/2013)  . Chronic kidney disease     has fistula, not on HD; "go to [facility] q 90 days to have kidneys checked out" (03/30/2013)  . Atrial tachycardia     a. dx 11/2012: tx with amiodarone, cardioversion (discharge rhythm: likely junctional).   Current Outpatient Prescriptions  Medication Sig Dispense Refill  . amLODipine (NORVASC) 10 MG tablet Take 1 tablet (10 mg total) by mouth daily.  30 tablet  6  . ARIPiprazole (ABILIFY) 5 MG tablet Take 5 mg by mouth daily.        Marland Kitchen aspirin EC 81 MG EC tablet Take 1 tablet (81 mg total) by mouth daily.      . carvedilol (COREG) 25 MG tablet Take 1 tablet (25 mg total) by mouth 2 (two) times daily with a meal.  60 tablet  6  . furosemide (LASIX) 40 MG tablet Take 1  tablet (40 mg total) by mouth daily.  30 tablet  6  . hydrALAZINE (APRESOLINE) 50 MG tablet Take 1.5 tablets (75 mg total) by mouth every 8 (eight) hours.  135 tablet  6  . metoCLOPramide (REGLAN) 10 MG tablet Take 10 mg by mouth 4 (four) times daily.      Marland Kitchen omeprazole (PRILOSEC) 40 MG capsule Take 40 mg by mouth 2 (two) times daily as needed. For heart burn      . amiodarone (PACERONE) 200 MG tablet Take 1 tablet (200 mg total) by mouth daily.  30 tablet  1  . budesonide-formoterol (SYMBICORT) 160-4.5 MCG/ACT inhaler Inhale 2 puffs into the lungs 2 (two) times daily.  1 Inhaler  1  . docusate sodium (COLACE) 100 MG capsule Take 100 mg by mouth 2 (two) times daily as needed. For constipation      . isosorbide mononitrate (IMDUR) 60 MG 24 hr tablet Take 1 tablet (60 mg total) by mouth daily.  30 tablet  6   No current facility-administered medications for this visit.   Family History  Problem  Relation Age of Onset  . Hypertension Mother   . Heart disease Mother   . Heart disease Brother    History   Social History  . Marital Status: Married    Spouse Name: N/A    Number of Children: N/A  . Years of Education: N/A   Occupational History  . disabled    Social History Main Topics  . Smoking status: Former Smoker -- 1.50 packs/day for 30 years    Types: Cigarettes    Quit date: 03/21/2011  . Smokeless tobacco: Never Used  . Alcohol Use: Yes     Comment: Quit alcohol in July of 2011  . Drug Use: Yes    Special: Cocaine, Marijuana     Comment: None since November "?2012" when had rectal bleeding and went to hospital  . Sexually Active: Not Currently   Other Topics Concern  . None   Social History Narrative   09/09/2012   He lives with mother and is separated from wife. Daughter in [place] with 2 grandchildren. He denies any cigs, drugs, alcohol at present.    Review of Systems:  Review of Systems  Constitutional: Positive for chills. Negative for fever.  HENT: Negative for nosebleeds, congestion, sore throat and neck pain.   Eyes: Positive for blurred vision. Negative for double vision, photophobia and pain.  Respiratory: Positive for sputum production, shortness of breath and wheezing. Negative for cough.   Cardiovascular: Positive for palpitations, orthopnea and leg swelling. Negative for chest pain.  Gastrointestinal: Positive for vomiting, abdominal pain and constipation. Negative for heartburn, nausea, diarrhea, blood in stool and melena.  Genitourinary: Negative for dysuria, urgency and frequency.  Musculoskeletal: Negative for myalgias, back pain and falls.  Skin: Negative for itching and rash.  Neurological: Positive for dizziness, tingling, weakness and headaches. Negative for sensory change, speech change, focal weakness, seizures and loss of consciousness.  Endo/Heme/Allergies: Bruises/bleeds easily.  Psychiatric/Behavioral: Positive for depression,  hallucinations and substance abuse. Negative for suicidal ideas and memory loss. The patient is nervous/anxious and has insomnia.        Patient feels like people are after him     Objective:  Physical Exam: Filed Vitals:   06/10/13 1546  BP: 177/90  Temp: 98.1 F (36.7 C)  TempSrc: Oral  Height: 5\' 8"  (1.727 m)  Weight: 152 lb 3.2 oz (69.037 kg)  SpO2: 97%   Physical Exam  Constitutional: He is oriented to person, place, and time. He appears well-developed and well-nourished.  HENT:  Head: Normocephalic and atraumatic.  Right Ear: External ear normal.  Left Ear: External ear normal.  Nose: Nose normal.  Mouth/Throat: Oropharynx is clear and moist. No oropharyngeal exudate.  The patient has an enlarged preauricular lymph node on his right side. No cervical lymphadenopathy  Eyes: Conjunctivae and EOM are normal. Pupils are equal, round, and reactive to light.  VF intact to confrontation bilaterally.  Neck: Normal range of motion. Neck supple. No tracheal deviation present. No thyromegaly present.  Cardiovascular: Normal rate and regular rhythm.   Murmur heard. Systolic murmur loudest at the RUSB and LUSB.  Pulmonary/Chest: Effort normal and breath sounds normal. No respiratory distress. He has no wheezes. He has no rales.  Scattered crackles in the bases.  Abdominal: Soft. Bowel sounds are normal. He exhibits no distension. There is no tenderness.  Musculoskeletal: He exhibits edema.  1+ edema of the LE bilaterally  Lymphadenopathy:    He has no cervical adenopathy.  Neurological: He is alert and oriented to person, place, and time.  Psychiatric: He has a normal mood and affect. His behavior is normal.  Patient denies active hallucinations. Admits paranoia of someone being after him.     Assessment & Plan:

## 2013-06-10 NOTE — Assessment & Plan Note (Signed)
A:  The patient has systolic and diastolic HF c/b moderate MV regurgitation s/p mitral valve annuloplasty. The patients heart failure has worsened with increased orthopnea and increasing SOB. The patients appears to be mildly fluid overloaded. Furthermore, the patient was last discharged from the ED on amiodarone daily, but has not been taking this medicatino.  P:  We referred the patient to his cardiologist at [facility] for evaluation and treatment. In the meantime, the we increased the patients lasix to 40 BID(from 40 qd) and will plan to see the patient in 1 week. We are also checking lipids today and will f/u as indicated. 

## 2013-06-10 NOTE — Assessment & Plan Note (Addendum)
A:  The patient has stage 4 CKD (Cr 3.7).   BMET    Component Value Date/Time   NA 136 04/01/2013 0434   K 3.7 04/01/2013 0434   CL 101 04/01/2013 0434   CO2 25 04/01/2013 0434   GLUCOSE 171* 04/01/2013 0434   BUN 48* 04/01/2013 0434   CREATININE 3.70* 04/01/2013 0434   CALCIUM 8.7 04/01/2013 0434   CALCIUM 9.3 06/28/2011 1331   GFRNONAA 18* 04/01/2013 0434   GFRAA 20* 04/01/2013 0434    P:  We referred the patient to nephrology for evaluation and treatment. Today we are checking CMET and CBC.

## 2013-06-10 NOTE — Assessment & Plan Note (Signed)
A:   The patients abdominal pain is likely secondary to gastroparesis although other etiologies are unable to be excluded.  P:  I recommended that the patient restart his Reglan and use toradol as needed for pain. I told the patient to avoid NSAIDs due to his kidney function. We are checking CMET, lipase, and CBC. The patient is in agreement with this plan. We will see the patient in [facility] to f/u his abdominal pain.

## 2013-06-10 NOTE — Assessment & Plan Note (Addendum)
A:  The patient has systolic and diastolic HF c/b moderate MV regurgitation s/p mitral valve annuloplasty. The patients heart failure has worsened with increased orthopnea and increasing SOB. The patients appears to be mildly fluid overloaded. Furthermore, the patient was last discharged from the ED on amiodarone daily, but has not been taking this medicatino.  P:  We referred the patient to his cardiologist at [facility] for evaluation and treatment. In the meantime, the we increased the patients lasix to 40 BID(from 40 qd) and will plan to see the patient in 1 week. We are also checking lipids today and will f/u as indicated.

## 2013-06-10 NOTE — Assessment & Plan Note (Signed)
A/P:  The patients GERD appears to be well controlled on current PPI.

## 2013-06-10 NOTE — Assessment & Plan Note (Signed)
BP Readings from Last 3 Encounters:  06/10/13 177/90  04/01/13 114/55  12/05/12 115/64    Lab Results  Component Value Date   NA 136 04/01/2013   K 3.7 04/01/2013   CREATININE 3.70* 04/01/2013    Assessment: Blood pressure control: moderately elevated Progress toward BP goal:  unchanged, worsened  Comments: The patient has been unable to take his imdur because he ran out of medications. Furthermore, he is in acute pain likely effecting his resting BP today.  Plan: Medications:  continue current medications, restarted Imdur 60 mg ER Other plans: Plan to f/u in 1 week and consider addition changes if BP remains elevated.

## 2013-06-10 NOTE — Patient Instructions (Addendum)
Restart metoclopramide (reglan) for gastroparesis  Restart Imdur for blood pressure control  Take Tramadol every 8 hours as needed for pain  Take Tylenol as directed by bottle for pain  DO NOT TAKE NSAIDs(Aleve, Ibuprofen, Excedrin)  Gastroparesis Medicine:  Metoclopramide (Reglan)  Blood Pressure Medicines:  Isosorbide mononitrate (Imdur)   Hydralazine (apresoline)  Amlodipine (Norvasc)   Heart Medicines:  Carvedilol(Coreg)   Furosemide (Lasix)   Mental health Medicines:  Aripiprazole (Abilify)  Follow up in [facility] in 1 week.

## 2013-06-10 NOTE — Assessment & Plan Note (Signed)
We are checking CBC today.

## 2013-06-11 ENCOUNTER — Other Ambulatory Visit: Payer: Self-pay | Admitting: Internal Medicine

## 2013-06-11 ENCOUNTER — Encounter: Payer: Self-pay | Admitting: *Deleted

## 2013-06-11 LAB — LIPID PANEL: LDL Cholesterol: 73 mg/dL (ref 0–99)

## 2013-06-11 LAB — COMPREHENSIVE METABOLIC PANEL
ALT: 20 U/L (ref 0–53)
AST: 24 U/L (ref 0–37)
Alkaline Phosphatase: 164 U/L — ABNORMAL HIGH (ref 39–117)
CO2: 23 mEq/L (ref 19–32)
Sodium: 140 mEq/L (ref 135–145)
Total Bilirubin: 0.5 mg/dL (ref 0.3–1.2)
Total Protein: 6.3 g/dL (ref 6.0–8.3)

## 2013-06-11 LAB — CBC
HCT: 34.2 % — ABNORMAL LOW (ref 39.0–52.0)
MCH: 23.8 pg — ABNORMAL LOW (ref 26.0–34.0)
MCHC: 32.2 g/dL (ref 30.0–36.0)
MCV: 74 fL — ABNORMAL LOW (ref 78.0–100.0)
Platelets: 212 10*3/uL (ref 150–400)
RDW: 18.4 % — ABNORMAL HIGH (ref 11.5–15.5)

## 2013-06-11 LAB — LIPASE: Lipase: 18 U/L (ref 0–75)

## 2013-06-15 NOTE — Progress Notes (Signed)
I saw and evaluated the patient.  I personally confirmed the key portions of the history and exam documented by Dr. Komanski and I reviewed pertinent patient test results.  The assessment, diagnosis, and plan were formulated together and I agree with the documentation in the resident's note.  

## 2013-06-18 ENCOUNTER — Ambulatory Visit (INDEPENDENT_AMBULATORY_CARE_PROVIDER_SITE_OTHER): Payer: Medicaid Other | Admitting: Internal Medicine

## 2013-06-18 ENCOUNTER — Encounter: Payer: Self-pay | Admitting: Internal Medicine

## 2013-06-18 VITALS — BP 113/58 | HR 55 | Temp 97.0°F | Wt 156.0 lb

## 2013-06-18 DIAGNOSIS — R51 Headache: Secondary | ICD-10-CM

## 2013-06-18 DIAGNOSIS — I498 Other specified cardiac arrhythmias: Secondary | ICD-10-CM

## 2013-06-18 DIAGNOSIS — R519 Headache, unspecified: Secondary | ICD-10-CM | POA: Insufficient documentation

## 2013-06-18 DIAGNOSIS — I491 Atrial premature depolarization: Secondary | ICD-10-CM

## 2013-06-18 DIAGNOSIS — R109 Unspecified abdominal pain: Secondary | ICD-10-CM

## 2013-06-18 LAB — TSH: TSH: 1.449 u[IU]/mL (ref 0.350–4.500)

## 2013-06-18 MED ORDER — HYDROCODONE-ACETAMINOPHEN 5-325 MG PO TABS: 1.0000 | ORAL_TABLET | Freq: Three times a day (TID) | ORAL | Status: DC | PRN

## 2013-06-18 NOTE — Progress Notes (Signed)
Subjective:   Patient ID: Ryan Winters male   DOB: June 27, 1961 52 y.o.   MRN: [id number]  HPI: [name] is a 52 y.o. man with a pmhx detailed below who comes to the clinic with an acute complaint of headache. The current headache has been occruing for the last month. The h/a has been slowly progressive and now is severe 8/10 pain. The pain is located bilaterally in the temples and includes his forehead. The h/a is constant and consists of a pressure and throbbing sensation. It has been much worse for the last three days and has been preventing him from sleeping.  The pain also has been waking the patient from sleep for the past three days. The pain is associated with photophobia and phonophobia. It is also associated with dizziness and ringing of the ears.  Of note, the patient has mild h/a's at baseline that occur about 2 times per month and respond to aleve. This h/a has not responded to aleve. Last week, I saw the patient and he was complaining of headache. I recommended conservative management and tramadol 50 mg every 8 hours. The patient has had mild relief with tramadol, but the pain has increased in severity over this time.   The patient denies neck pain and stiffness, denies fever/chills/ and vomiting. The patient admits to nausea similar to his baseline. He denies focal sensory changes or weakness.   Past Medical History  Diagnosis Date  . Hypertension   . Bipolar disorder   . Schizophrenia     h/o hospitalized inpt therapy @ the [facility]  . Valvular heart disease     a. 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 b. 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  Marland Kitchen Hyperlipidemia   . H/O: suicide attempt 1998    was hospitalized at [facility]  . Anemia     Hx of iron infusions  . Gastroparesis   . Arthritis   . Cocaine abuse     per pt none x 3 yrs however continues to "handle" it and deal it with + UDS.  Marland Kitchen Anxiety   . CHF (congestive  heart failure)     a. EF 25-30% by echo 12/02/12, but 55% by TEE 12/04/12.  Marland Kitchen GERD (gastroesophageal reflux disease)   . Depression   . Vertigo   . Itchy skin   . Peripheral neuropathy     BLE  . Bilateral lower abdominal cramping   . Shortness of breath     "periodically; happens at any time" (03/30/13)  . Generalized headaches     "monthly" (03/30/2013)  . Chronic kidney disease     has fistula, not on HD; "go to [facility] q 90 days to have kidneys checked out" (03/30/2013)  . Atrial tachycardia     a. dx 11/2012: tx with amiodarone, cardioversion (discharge rhythm: likely junctional).   Current Outpatient Prescriptions  Medication Sig Dispense Refill  . amLODipine (NORVASC) 10 MG tablet Take 1 tablet (10 mg total) by mouth daily.  30 tablet  6  . ARIPiprazole (ABILIFY) 5 MG tablet Take 5 mg by mouth daily.        Marland Kitchen aspirin EC 81 MG EC tablet Take 1 tablet (81 mg total) by mouth daily.      . budesonide-formoterol (SYMBICORT) 160-4.5 MCG/ACT inhaler Inhale 2 puffs into the lungs 2 (two) times daily.  1 Inhaler  1  . carvedilol (COREG) 25 MG tablet Take 1 tablet (25 mg total)  by mouth 2 (two) times daily with a meal.  60 tablet  6  . docusate sodium (COLACE) 100 MG capsule Take 100 mg by mouth 2 (two) times daily as needed. For constipation      . furosemide (LASIX) 40 MG tablet Take 1 tablet (40 mg total) by mouth 2 (two) times daily.  30 tablet  6  . hydrALAZINE (APRESOLINE) 50 MG tablet Take 1.5 tablets (75 mg total) by mouth every 8 (eight) hours.  135 tablet  6  . HYDROcodone-acetaminophen (NORCO) 5-325 MG per tablet Take 1 tablet by mouth every 8 (eight) hours as needed for pain.  21 tablet  0  . isosorbide mononitrate (IMDUR) 60 MG 24 hr tablet Take 1 tablet (60 mg total) by mouth daily.  30 tablet  6  . metoCLOPramide (REGLAN) 10 MG tablet Take 10 mg by mouth 4 (four) times daily.      Marland Kitchen omeprazole (PRILOSEC) 40 MG capsule Take 40 mg by mouth 2 (two) times daily as needed. For  heart burn       No current facility-administered medications for this visit.   Family History  Problem Relation Age of Onset  . Hypertension Mother   . Heart disease Mother   . Heart disease Brother    History   Social History  . Marital Status: Married    Spouse Name: N/A    Number of Children: N/A  . Years of Education: N/A   Occupational History  . disabled    Social History Main Topics  . Smoking status: Former Smoker -- 1.50 packs/day for 30 years    Types: Cigarettes    Quit date: 03/21/2011  . Smokeless tobacco: Never Used  . Alcohol Use: Yes     Comment: Quit alcohol in July of 2011  . Drug Use: Yes    Special: Cocaine, Marijuana     Comment: None since November "?2012" when had rectal bleeding and went to hospital  . Sexually Active: Not Currently   Other Topics Concern  . None   Social History Narrative   09/09/2012   He lives with mother and is separated from wife. Daughter in [place] with 2 grandchildren. He denies any cigs, drugs, alcohol at present.    Review of Systems: Pertinent items are noted in HPI. Objective:  Physical Exam: Filed Vitals:   06/18/13 0958  BP: 113/58  Pulse: 55  Temp: 97 F (36.1 C)  Weight: 156 lb (70.761 kg)  SpO2: 99%   Physical Exam  Constitutional: He is oriented to person, place, and time. He appears well-developed and well-nourished.  HENT:  Head: Normocephalic and atraumatic.  Mouth/Throat: Oropharynx is clear and moist. No oropharyngeal exudate.  1 cm pre-auricular lymph node.  Eyes: EOM are normal. Pupils are equal, round, and reactive to light.  photophobic  Neck: Normal range of motion. Neck supple. No tracheal deviation present. No thyromegaly present.  Cardiovascular: Normal rate, regular rhythm, normal heart sounds and intact distal pulses.  Exam reveals no friction rub.   No murmur heard. Pulmonary/Chest: Effort normal and breath sounds normal. No respiratory distress. He has no wheezes. He has no  rales.  Abdominal: Soft. Bowel sounds are normal.  Lymphadenopathy:    He has no cervical adenopathy.  Neurological: He is alert and oriented to person, place, and time. He has normal reflexes. No cranial nerve deficit. Coordination normal.     Assessment & Plan:

## 2013-06-18 NOTE — Assessment & Plan Note (Addendum)
CMP     Component Value Date/Time   NA 140 06/10/2013 1645   K 3.9 06/10/2013 1645   CL 107 06/10/2013 1645   CO2 23 06/10/2013 1645   GLUCOSE 99 06/10/2013 1645   BUN 38* 06/10/2013 1645   CREATININE 2.90* 06/10/2013 1645   CREATININE 3.70* 04/01/2013 0434   CALCIUM 9.0 06/10/2013 1645   CALCIUM 9.3 06/28/2011 1331   PROT 6.3 06/10/2013 1645   ALBUMIN 3.6 06/10/2013 1645   AST 24 06/10/2013 1645   ALT 20 06/10/2013 1645   ALKPHOS 164* 06/10/2013 1645   BILITOT 0.5 06/10/2013 1645   GFRNONAA 18* 04/01/2013 0434   GFRAA 20* 04/01/2013 0434   Lipase     Component Value Date/Time   LIPASE 18 06/10/2013 1645    CBC    Component Value Date/Time   WBC 5.9 06/10/2013 1645   RBC 4.62 06/10/2013 1645   HGB 11.0* 06/10/2013 1645   HCT 34.2* 06/10/2013 1645   PLT 212 06/10/2013 1645   MCV 74.0* 06/10/2013 1645   MCH 23.8* 06/10/2013 1645   MCHC 32.2 06/10/2013 1645   RDW 18.4* 06/10/2013 1645   LYMPHSABS 0.9 11/27/2012 0240   MONOABS 0.4 11/27/2012 0240   EOSABS 0.1 11/27/2012 0240   BASOSABS 0.0 11/27/2012 0240    A:  The patients abdominal pain is likely secondary to gastroparesis although other etiologies are unable to be excluded. Last week, I recommended that the patient restart his Reglan. Also, I told the patient to avoid NSAIDs due to his kidney function. He has complied with this with some improvement of his symptoms chiefly no vomiting. CMET, lipase, and CBC from last weeks visits were reviewed and are included above.  He continues to have nausea and abdominal that are unchanged from his baseline.   P:   While we work up his headache, I recommended that we make no more adjustments in terms of his abdominal pain. The patient is in agreement with this plan. We will see the patient in [facility] to f/u his abdominal pain.

## 2013-06-18 NOTE — Assessment & Plan Note (Signed)
A:  The patient's ongoing headache has concerning signs including new onset at age >94, intractable and progressive, and waking from sleep. As the headache is slowly progressive over the last month and the patient denies fevers and neck stiffness, I believe that [facility] and infectious meningitis are less likely.   P:  I recommend that the patient use Vicodin 5/325 tid prn for pain (21 pills to cover 1 week). I have also recommended that the patient undergo MRI of the brain w/o contrast to determine if there is an intracranial etiology for his headache. I plan to f/u with the patient in 1 week to reevaluate.

## 2013-06-18 NOTE — Patient Instructions (Signed)
Take hydrocodone-APAp 5/325 every 8 hours as needed for pain  MRI of Brain  I am checking some labs, and will call you if they are abnormal.

## 2013-06-19 NOTE — Progress Notes (Signed)
I saw patient and discussed his care with Dr. Komanski at the time of the visit.  We reviewed the resident's history and exam and pertinent patient test results.  I agree with the assessment, diagnosis, and plan of care documented in the resident's note. 

## 2013-06-23 ENCOUNTER — Encounter: Payer: Self-pay | Admitting: Internal Medicine

## 2013-07-02 ENCOUNTER — Encounter: Payer: Self-pay | Admitting: *Deleted

## 2013-07-03 ENCOUNTER — Ambulatory Visit (INDEPENDENT_AMBULATORY_CARE_PROVIDER_SITE_OTHER): Payer: Medicaid Other | Admitting: Nurse Practitioner

## 2013-07-03 ENCOUNTER — Encounter: Payer: Self-pay | Admitting: Nurse Practitioner

## 2013-07-03 ENCOUNTER — Ambulatory Visit (INDEPENDENT_AMBULATORY_CARE_PROVIDER_SITE_OTHER): Payer: Medicaid Other | Admitting: Internal Medicine

## 2013-07-03 ENCOUNTER — Encounter: Payer: Self-pay | Admitting: Internal Medicine

## 2013-07-03 ENCOUNTER — Telehealth: Payer: Self-pay | Admitting: *Deleted

## 2013-07-03 VITALS — BP 168/90 | HR 76 | Ht 64.0 in | Wt 160.0 lb

## 2013-07-03 VITALS — BP 175/88 | HR 73 | Temp 97.6°F | Ht 66.0 in | Wt 160.3 lb

## 2013-07-03 DIAGNOSIS — Z952 Presence of prosthetic heart valve: Secondary | ICD-10-CM

## 2013-07-03 DIAGNOSIS — I10 Essential (primary) hypertension: Secondary | ICD-10-CM

## 2013-07-03 DIAGNOSIS — R51 Headache: Secondary | ICD-10-CM

## 2013-07-03 DIAGNOSIS — Z954 Presence of other heart-valve replacement: Secondary | ICD-10-CM

## 2013-07-03 MED ORDER — HYDROCODONE-ACETAMINOPHEN 5-325 MG PO TABS: 1.0000 | ORAL_TABLET | Freq: Three times a day (TID) | ORAL | Status: DC | PRN

## 2013-07-03 MED ORDER — HYDRALAZINE HCL 100 MG PO TABS: 100.0000 mg | ORAL_TABLET | Freq: Two times a day (BID) | ORAL | Status: DC

## 2013-07-03 NOTE — Progress Notes (Signed)
Shriyan Dillenbeck Date of Birth: 1961/04/28 Medical Record #[id number]  History of Present Illness: Mr. Pitcock is seen back today for a follow up visit. Seen for Dr. Excell Seltzer. He has valvular heart disease and has had MV repair a few years ago. Has continued to have NYHA class III symptoms with recurrent severe MR and underwent redo surgery with MVR and tricuspid annuloplasty back in November of 2013. Other issues include schizophrenia, CKD stage 4, malignant HTN and CKD and polysubstance abuse - "handles cocaine". Has minimal CAD per his cath prior to his last heart surgery.   Has missed/cancelled/no showed for numerous visits.  Last echo from May of 2014 showed a normal EF and normal bioprosthetic MV. This was done while admitted for CHF - due to missing his medicines. UDS was positive for cocaine again.   Comes in today. Here alone. Says he is here for a follow up. Says he is doing ok. No chest pain. Not short of breath. Just took his medicines about 15 minutes prior to this visit today. Says he is "behaving". Has his Medicaid back and does not need to deal cocaine any longer. Has been having some headaches and seeing his PCP. Apparently has been referred to nephrology as well. Had labs last month.    Current Outpatient Prescriptions  Medication Sig Dispense Refill  . amLODipine (NORVASC) 10 MG tablet Take 1 tablet (10 mg total) by mouth daily.  30 tablet  6  . ARIPiprazole (ABILIFY) 5 MG tablet Take 5 mg by mouth daily.        Marland Kitchen aspirin EC 81 MG EC tablet Take 1 tablet (81 mg total) by mouth daily.      . budesonide-formoterol (SYMBICORT) 160-4.5 MCG/ACT inhaler Inhale 2 puffs into the lungs 2 (two) times daily.  1 Inhaler  1  . carvedilol (COREG) 25 MG tablet Take 1 tablet (25 mg total) by mouth 2 (two) times daily with a meal.  60 tablet  6  . docusate sodium (COLACE) 100 MG capsule Take 100 mg by mouth 2 (two) times daily as needed. For constipation      . furosemide (LASIX) 40 MG tablet  Take 1 tablet (40 mg total) by mouth 2 (two) times daily.  30 tablet  6  . HYDROcodone-acetaminophen (NORCO) 5-325 MG per tablet Take 1 tablet by mouth every 8 (eight) hours as needed for pain.  21 tablet  0  . isosorbide mononitrate (IMDUR) 60 MG 24 hr tablet Take 1 tablet (60 mg total) by mouth daily.  30 tablet  6  . metoCLOPramide (REGLAN) 10 MG tablet Take 10 mg by mouth 4 (four) times daily.      Marland Kitchen omeprazole (PRILOSEC) 40 MG capsule Take 40 mg by mouth 2 (two) times daily as needed. For heart burn      . hydrALAZINE (APRESOLINE) 100 MG tablet Take 1 tablet (100 mg total) by mouth 2 (two) times daily.  90 tablet  6   No current facility-administered medications for this visit.    Allergies  Allergen Reactions  . Cashew Nut Oil Anaphylaxis  . Food Anaphylaxis    Strawberries, Cashews, causes throat to swell - affects breathing    Past Medical History  Diagnosis Date  . Hypertension   . Bipolar disorder   . Schizophrenia     h/o hospitalized inpt therapy @ the [facility]  . Valvular heart disease     a. 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 b. Had bioprosthetic  MV replacement with TV repair 09/2012.  Marland Kitchen Hyperlipidemia   . H/O: suicide attempt 1998    was hospitalized at [facility]  . Anemia     Hx of iron infusions  . Gastroparesis   . Arthritis   . Cocaine abuse     per pt none x 3 yrs however continues to "handle" it and deal it with + UDS.  Marland Kitchen Anxiety   . CHF (congestive heart failure)     a. EF 25-30% by echo 12/02/12, but 55% by TEE 12/04/12.  Marland Kitchen GERD (gastroesophageal reflux disease)   . Depression   . Vertigo   . Itchy skin   . Peripheral neuropathy     BLE  . Bilateral lower abdominal cramping   . Shortness of breath     "periodically; happens at any time" (03/30/13)  . Generalized headaches     "monthly" (03/30/2013)  . Chronic kidney disease     has fistula, not on HD; "go to [facility] q 90 days to have kidneys checked out" (03/30/2013)    . Atrial tachycardia     a. dx 11/2012: tx with amiodarone, cardioversion (discharge rhythm: likely junctional).    Past Surgical History  Procedure Laterality Date  . Mitral valve repair  05/2010  . Av fistula placement, radiocephalic Left 05/24/2011    arm; "aien't never had to use it" (03/30/2013)  . Laceration repair Right ~ 1979  . Umbilical hernia repair  04/01/2012    Procedure: HERNIA REPAIR UMBILICAL ADULT;  Surgeon: Ardeth Sportsman, MD;  Location: [facility] OR;  Service: General;  Laterality: N/A;  supra umbilical  . Tee without cardioversion  08/25/2012    Procedure: TRANSESOPHAGEAL ECHOCARDIOGRAM (TEE);  Surgeon: Wendall Stade, MD;  Location: [facility] ENDOSCOPY;  Service: Cardiovascular;  Laterality: N/A;  . Mitral valve replacement  09/23/2012    Procedure: REDO MITRAL VALVE REPLACEMENT (MVR);  Surgeon: [name], MD;  Location: [facility] OR;  Service: Open Heart Surgery;  Laterality: N/A;  Nitric Oxide  . Tricuspid valve replacement  09/23/2012    Procedure: TRICUSPID VALVE REPAIR;  Surgeon: [name], MD;  Location: [facility] OR;  Service: Open Heart Surgery;  Laterality: N/A;  . Tee without cardioversion  12/04/2012    Procedure: TRANSESOPHAGEAL ECHOCARDIOGRAM (TEE);  Surgeon: Laurey Morale, MD;  Location: [facility] ENDOSCOPY;  Service: Cardiovascular;  Laterality: N/A;  CV 1015/vicki, dl  . Cardioversion  02/25/8118    Procedure: CARDIOVERSION;  Surgeon: Laurey Morale, MD;  Location: [facility] ENDOSCOPY;  Service: Cardiovascular;  Laterality: N/A;  . Cardiac valve replacement      mitral  . Cardiac catheterization  05/26/2010    Hattie Perch 03/30/2013    History  Smoking status  . Former Smoker -- 1.50 packs/day for 30 years  . Types: Cigarettes  . Quit date: 03/21/2011  Smokeless tobacco  . Never Used    History  Alcohol Use  . Yes    Comment: Quit alcohol in July of 2011    Family History  Problem Relation Age of Onset  . Hypertension Mother   . Heart disease Mother   . Heart disease  Brother     Review of Systems: The review of systems is per the HPI.  All other systems were reviewed and are negative.  Physical Exam: BP 168/90  Pulse 76  Ht 5\' 4"  (1.626 m)  Wt 160 lb (72.576 kg)  BMI 27.45 kg/m2 Patient is alert and in no acute distress. Skin is warm  and dry. Color is normal.  HEENT is unremarkable. Normocephalic/atraumatic. PERRL. Sclera are nonicteric. Neck is supple. No masses. No JVD. Lungs are clear. Cardiac exam shows a regular rate and rhythm. Valve sounds ok. Abdomen is soft. Extremities are without edema. Gait and ROM are intact. No gross neurologic deficits noted.  LABORATORY DATA:  Lab Results  Component Value Date   WBC 5.9 06/10/2013   HGB 11.0* 06/10/2013   HCT 34.2* 06/10/2013   PLT 212 06/10/2013   GLUCOSE 99 06/10/2013   CHOL 152 06/10/2013   TRIG 59 06/10/2013   HDL 67 06/10/2013   LDLCALC 73 06/10/2013   ALT 20 06/10/2013   AST 24 06/10/2013   NA 140 06/10/2013   K 3.9 06/10/2013   CL 107 06/10/2013   CREATININE 2.90* 06/10/2013   BUN 38* 06/10/2013   CO2 23 06/10/2013   TSH 1.449 06/18/2013   INR 1.21 03/30/2013   HGBA1C 4.7 09/19/2012   MICROALBUR 0.64 05/25/2010   Echo Study Conclusions from May 2014  - Left ventricle: The cavity size was normal. Wall thickness was increased in a pattern of moderate LVH. Systolic function was normal. The estimated ejection fraction was in the range of 50% to 55%. Wall motion was normal; there were no regional wall motion abnormalities. - Mitral valve: A bioprosthesis was present and functioning normally. Mild regurgitation. - Left atrium: The atrium was mildly to moderately dilated. - Right ventricle: The cavity size was mildly dilated. - Right atrium: The atrium was mildly dilated. - Tricuspid valve: Prior procedures included surgical repair. - Pulmonary arteries: PA peak pressure: 46mm Hg (S).    Assessment / Plan: 1. MVR - last echo just 3 months ago showing satisfactory function.   2. Noncompliance  - long standing issue.  3. Polysubstance abuse - says he is not "handling". I encouraged total cessation.   4. Malignant HTN - Hydralazine is increased to 100 mg TID.   5. CKD - to be seeing nephrology.   I will get him back to see Dr. Excell Seltzer in about 4 months. Healthy behaviors once again encouraged.   Patient is agreeable to this plan and will call if any problems develop in the interim.   Rosalio Macadamia, RN, ANP-C [facility] [address] [place], [place]  [address]

## 2013-07-03 NOTE — Assessment & Plan Note (Addendum)
Assessment: Pt presenting with atypical headache with some features of migraine - pulsating, photophobia, phonophobia, nausea, disabling, and normal neurological exam. However age of onset, no ora and bilateral nature with no strong family history is not consistent with typical migraine. Alarm feature for new onset headache after age 52-52 may indicate malignancy. Pt with uncontrolled hypertension today and worsening headache. Sinusitis could be possible, would need CT sinuses. HIV status was negative in 2011, consider repeat. Other concerning features are absent, such as abrupt-onset severe headache, visual complaints, fever, jaw claudication, worsening with cough, history of malignancy, focal neurologic deficit, seizure, and scalp tenderness.     Plan:  -To refill Norco 5/325 TID as needed for pain - 21 pills to last 1 week   -To have MRI brain on 07/07/13 -To follow-up on 07/08/13 to discuss results and further management

## 2013-07-03 NOTE — Telephone Encounter (Signed)
Pt calls and states his h/a is continuing to cause him discomfort, 10/10, appt given for 1430 dr Trixie Rude. informed

## 2013-07-03 NOTE — Progress Notes (Signed)
Patient ID: Ryan Winters, male   DOB: 1961-01-25, 52 y.o.   MRN: [id number]   Subjective:   Patient ID: Ryan Winters male   DOB: 03/26/1961 52 y.o.   MRN: [id number]  HPI: [name] is a 52 y.o. with HTN, NYHA Class 3 CHF (EF 50-55%), valvular disease s/p MV repair, CKD Stage 4, polysubstance abuse, gastroparesis,  bipolar disease, schizophrenia, and anxiety who presents for follow-up visit. Patient was seen recently for new onset headache that started about a month ago. Pt with no prior history of headaches or migraines. FH of migraine in cousin. He describes it today as bilateral pounding and throbbing 10/10 pain located bilaterally in temporals and across his forehead with pressure behind his eyes and nose that is constant. Headache is worst in the afternoon and when sitting. It also wakes him up at night.  At last visit he was prescribed Norco 5/325 which he took 52-2 per day and finished yesterday with relief of pain to 3/10 but has gradually been increasing. No caffeine use. He reports his headache has worsened for the past 2 days. He has been nausous with no vomiting, and uses reglan which helps. Also with photophobia and phonophobia. No reports of aura. Also today with some nasal congestion and watery eyes but no history of sinusitis, allergic rhinitis, or sinus tenderness. Reports some confusion. He denies fever, neck stiffness/pain, scalp lptenderness, tunnel vision, paraesthesias, weakness, gait abnormalities (uses cane to ambulate), or changes in weight. He reports soreness in the roof of his mouth for the last couple of days. He is scheduled for MRI brain on Tuesday August 19.   Pt measures his blood pressure at home and this morning was in the 170's/60's, it is normally 140s/60s.  He reports feeling dizzy this morning.  He had an appointment today with his cardiologist  who increased his hydralazine from 100mg  BID to TID. He has been complacent with taking his blood pressure  medications.         Past Medical History  Diagnosis Date  . Hypertension   . Bipolar disorder   . Schizophrenia     h/o hospitalized inpt therapy @ the [facility]  . Valvular heart disease     a. 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 b. 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  Marland Kitchen Hyperlipidemia   . H/O: suicide attempt 1998    was hospitalized at [facility]  . Anemia     Hx of iron infusions  . Gastroparesis   . Arthritis   . Cocaine abuse     per pt none x 3 yrs however continues to "handle" it and deal it with + UDS.  Marland Kitchen Anxiety   . CHF (congestive heart failure)     a. EF 25-30% by echo 12/02/12, but 55% by TEE 12/04/12.  Marland Kitchen GERD (gastroesophageal reflux disease)   . Depression   . Vertigo   . Itchy skin   . Peripheral neuropathy     BLE  . Bilateral lower abdominal cramping   . Shortness of breath     "periodically; happens at any time" (03/30/13)  . Generalized headaches     "monthly" (03/30/2013)  . Chronic kidney disease     has fistula, not on HD; "go to [facility] q 90 days to have kidneys checked out" (03/30/2013)  . Atrial tachycardia     a. dx 11/2012: tx with amiodarone, cardioversion (discharge rhythm: likely junctional).   Current Outpatient Prescriptions  Medication Sig Dispense Refill  . amLODipine (NORVASC) 10 MG tablet Take 1 tablet (10 mg total) by mouth daily.  30 tablet  6  . ARIPiprazole (ABILIFY) 5 MG tablet Take 5 mg by mouth daily.        Marland Kitchen aspirin EC 81 MG EC tablet Take 1 tablet (81 mg total) by mouth daily.      . budesonide-formoterol (SYMBICORT) 160-4.5 MCG/ACT inhaler Inhale 2 puffs into the lungs 2 (two) times daily.  1 Inhaler  1  . carvedilol (COREG) 25 MG tablet Take 1 tablet (25 mg total) by mouth 2 (two) times daily with a meal.  60 tablet  6  . docusate sodium (COLACE) 100 MG capsule Take 100 mg by mouth 2 (two) times daily as needed. For constipation      . furosemide (LASIX) 40 MG tablet Take 1  tablet (40 mg total) by mouth 2 (two) times daily.  30 tablet  6  . hydrALAZINE (APRESOLINE) 100 MG tablet Take 1 tablet (100 mg total) by mouth 2 (two) times daily.  90 tablet  6  . HYDROcodone-acetaminophen (NORCO) 5-325 MG per tablet Take 1 tablet by mouth every 8 (eight) hours as needed for pain.  21 tablet  0  . isosorbide mononitrate (IMDUR) 60 MG 24 hr tablet Take 1 tablet (60 mg total) by mouth daily.  30 tablet  6  . metoCLOPramide (REGLAN) 10 MG tablet Take 10 mg by mouth 4 (four) times daily.      Marland Kitchen omeprazole (PRILOSEC) 40 MG capsule Take 40 mg by mouth 2 (two) times daily as needed. For heart burn       No current facility-administered medications for this visit.   Family History  Problem Relation Age of Onset  . Hypertension Mother   . Heart disease Mother   . Heart disease Brother    History   Social History  . Marital Status: Married    Spouse Name: N/A    Number of Children: N/A  . Years of Education: N/A   Occupational History  . disabled    Social History Main Topics  . Smoking status: Former Smoker -- 1.50 packs/day for 30 years    Types: Cigarettes    Quit date: 03/21/2011  . Smokeless tobacco: Never Used  . Alcohol Use: Yes     Comment: Quit alcohol in July of 2011  . Drug Use: Yes    Special: Cocaine, Marijuana     Comment: None since November "?2012" when had rectal bleeding and went to hospital  . Sexual Activity: Not Currently   Other Topics Concern  . None   Social History Narrative   09/09/2012   He lives with mother and is separated from wife. Daughter in [place] with 2 grandchildren. He denies any cigs, drugs, alcohol at present.    Review of Systems: Review of Systems  Constitutional: Negative for fever and weight loss.  HENT: Positive for congestion and sore throat (roof of mouth). Negative for neck pain and tinnitus.   Eyes: Positive for pain. Negative for blurred vision.  Respiratory: Negative for cough and shortness of breath.     Cardiovascular: Negative for chest pain, palpitations and leg swelling.  Gastrointestinal: Positive for heartburn and nausea. Negative for vomiting, abdominal pain, diarrhea and constipation.  Genitourinary: Negative for dysuria.  Neurological: Positive for dizziness and headaches. Negative for tingling, sensory change, loss of consciousness and weakness.  Psychiatric/Behavioral:       Claustophobia  Objective:  Physical Exam: Filed Vitals:   07/03/13 1439  BP: 175/88  Pulse: 73  Temp: 97.6 F (36.4 C)  TempSrc: Oral  Height: 5\' 6"  (1.676 m)  Weight: 160 lb 4.8 oz (72.712 kg)  SpO2: 96%   Physical Exam  Constitutional: He is oriented to person, place, and time. He appears well-developed and well-nourished. No distress.  HENT:  Head: Normocephalic and atraumatic.  Right Ear: External ear normal.  Left Ear: External ear normal.  Nose: Nose normal.  Mouth/Throat: Oropharynx is clear and moist. No oropharyngeal exudate.  Eyes: Conjunctivae and EOM are normal. Pupils are equal, round, and reactive to light. Right eye exhibits no discharge. Left eye exhibits no discharge. No scleral icterus.  Photophobic, in dark room  Cardiovascular: Normal rate and regular rhythm.   No murmur heard. Pulmonary/Chest: Breath sounds normal. No respiratory distress. He has no wheezes. He has no rales.  Abdominal: Soft. Bowel sounds are normal. He exhibits no distension. There is no tenderness. There is no rebound and no guarding.  Musculoskeletal: Normal range of motion. He exhibits no edema and no tenderness.  Neurological: He is alert and oriented to person, place, and time. No cranial nerve deficit.  Normal muscle strength Normal sensation to light touch No facial droop   Skin: Skin is warm and dry. No rash noted. He is not diaphoretic. No erythema. No pallor.  Psychiatric: He has a normal mood and affect. His behavior is normal.  Would like door open due to claustrophobia    Assessment  & Plan:  Please see problem list for problem-based assessment and plan

## 2013-07-03 NOTE — Patient Instructions (Addendum)
Increase your Hydralazine to 100 mg three times a day - I sent this to the drug store  See Dr. Excell Seltzer in 4 months  Call the [facility] office at [phone] if you have any questions, problems or concerns.

## 2013-07-03 NOTE — Patient Instructions (Addendum)
-  Please take Norco 1 tablet every 8 hrs as needed for pain -Your MRI of your brain in on August 19 -We will see you back on August 20 to discuss the results of your MRI  -Continue to monitor your blood pressure at home -[name] meeting you!

## 2013-07-03 NOTE — Assessment & Plan Note (Addendum)
Assessment: Pt with uncontrolled blood pressure today 175/88. This morning symptomatic with dizziness and  headache. Was seen at cardiologist office and hydralazine increased from BID to TID. HTN refractory to multiple medications since last visit end of July. Most likely due to CKD, should rule out other possible secondary causes, last Na and K WNL.   Plan:    -Continue hydralazine 100mg  TID  -Continue amlodipine 10mg  QD -Continue coreg 25mg  BID -Continue lasix 40mg  BID  -Continue imdur 60 mg QD -To continue to to monitor bp at home

## 2013-07-06 NOTE — Progress Notes (Signed)
I saw and evaluated the patient.  I personally confirmed the key portions of the history and exam documented by Dr. Rabbani and I reviewed pertinent patient test results.  The assessment, diagnosis, and plan were formulated together and I agree with the documentation in the resident's note.  

## 2013-07-07 ENCOUNTER — Ambulatory Visit (HOSPITAL_COMMUNITY)
Admission: RE | Admit: 2013-07-07 | Discharge: 2013-07-07 | Disposition: A | Discharge status: Home / Self Care | Payer: Medicaid Other | Source: Ambulatory Visit | Attending: Internal Medicine | Admitting: Internal Medicine

## 2013-07-07 ENCOUNTER — Telehealth: Payer: Self-pay | Admitting: *Deleted

## 2013-07-07 DIAGNOSIS — R51 Headache: Secondary | ICD-10-CM

## 2013-07-07 NOTE — Telephone Encounter (Signed)
Pt presented to clinic today to inform office that he was scheduled for a MRI today at 4pm and was unable to get done, he felt claustrophobic.  Mri has been rescheduled for Fri Aug 22 at 1pm, but pt will need to arrive by 12:45pm if he is to take something prior to procedure.  Spoke with attending, DrRogers and he agreed to write rx for ativan 1mg   #1 with no refills to take one hour prior to MRI.  Pt left before I returned to speak with him, but he already has a f/u appt made for tomorrow with DrKazibwe.  Will have Dr Zada Girt write rx for ativan and inform pt of new appt time/date of MRI.Ryan Spittle Cassady8/19/20144:56 PM

## 2013-07-08 ENCOUNTER — Encounter: Payer: Self-pay | Admitting: Internal Medicine

## 2013-07-08 ENCOUNTER — Ambulatory Visit: Payer: Self-pay | Admitting: Internal Medicine

## 2013-07-09 MED ORDER — LORAZEPAM 1 MG PO TABS: ORAL_TABLET | ORAL | Status: DC

## 2013-07-09 NOTE — Telephone Encounter (Signed)
Pt was a no show for appt, wanted to r/s it for after the MRI.  Will place rx for ativan and send to attending MD for signature.Ryan Spittle Cassady8/21/20142:57 PM

## 2013-07-09 NOTE — Addendum Note (Signed)
Addended by: Maura Crandall on: 07/09/2013 02:59 PM   Modules accepted: Orders

## 2013-07-10 ENCOUNTER — Ambulatory Visit (HOSPITAL_COMMUNITY)
Admission: RE | Admit: 2013-07-10 | Discharge: 2013-07-10 | Disposition: A | Discharge status: Home / Self Care | Payer: Medicaid Other | Source: Ambulatory Visit | Attending: Internal Medicine | Admitting: Internal Medicine

## 2013-07-10 DIAGNOSIS — J3489 Other specified disorders of nose and nasal sinuses: Secondary | ICD-10-CM | POA: Insufficient documentation

## 2013-07-10 DIAGNOSIS — G43909 Migraine, unspecified, not intractable, without status migrainosus: Secondary | ICD-10-CM | POA: Insufficient documentation

## 2013-07-10 DIAGNOSIS — M47812 Spondylosis without myelopathy or radiculopathy, cervical region: Secondary | ICD-10-CM | POA: Insufficient documentation

## 2013-07-10 DIAGNOSIS — R259 Unspecified abnormal involuntary movements: Secondary | ICD-10-CM | POA: Insufficient documentation

## 2013-07-10 DIAGNOSIS — L989 Disorder of the skin and subcutaneous tissue, unspecified: Secondary | ICD-10-CM | POA: Insufficient documentation

## 2013-07-10 DIAGNOSIS — Z8673 Personal history of transient ischemic attack (TIA), and cerebral infarction without residual deficits: Secondary | ICD-10-CM | POA: Insufficient documentation

## 2013-07-13 ENCOUNTER — Ambulatory Visit (INDEPENDENT_AMBULATORY_CARE_PROVIDER_SITE_OTHER): Payer: Medicaid Other | Admitting: Internal Medicine

## 2013-07-13 ENCOUNTER — Encounter: Payer: Self-pay | Admitting: Internal Medicine

## 2013-07-13 ENCOUNTER — Ambulatory Visit (HOSPITAL_COMMUNITY)
Admission: RE | Admit: 2013-07-13 | Discharge: 2013-07-13 | Disposition: A | Discharge status: Home / Self Care | Payer: Medicaid Other | Source: Ambulatory Visit | Attending: Obstetrics and Gynecology | Admitting: Obstetrics and Gynecology

## 2013-07-13 VITALS — BP 136/80 | HR 130 | Temp 97.3°F | Ht 66.0 in | Wt 157.4 lb

## 2013-07-13 DIAGNOSIS — I472 Ventricular tachycardia, unspecified: Secondary | ICD-10-CM

## 2013-07-13 DIAGNOSIS — R51 Headache: Secondary | ICD-10-CM

## 2013-07-13 DIAGNOSIS — I4719 Other supraventricular tachycardia: Secondary | ICD-10-CM

## 2013-07-13 DIAGNOSIS — I509 Heart failure, unspecified: Secondary | ICD-10-CM

## 2013-07-13 DIAGNOSIS — R9431 Abnormal electrocardiogram [ECG] [EKG]: Secondary | ICD-10-CM | POA: Insufficient documentation

## 2013-07-13 DIAGNOSIS — I479 Paroxysmal tachycardia, unspecified: Secondary | ICD-10-CM | POA: Insufficient documentation

## 2013-07-13 DIAGNOSIS — I5043 Acute on chronic combined systolic (congestive) and diastolic (congestive) heart failure: Secondary | ICD-10-CM

## 2013-07-13 DIAGNOSIS — I471 Supraventricular tachycardia: Secondary | ICD-10-CM

## 2013-07-13 MED ORDER — HYDROCODONE-ACETAMINOPHEN 5-325 MG PO TABS: 1.0000 | ORAL_TABLET | Freq: Two times a day (BID) | ORAL | Status: DC

## 2013-07-13 NOTE — Progress Notes (Signed)
Patient ID: Ryan Winters, male   DOB: 1961-09-15, 52 y.o.   MRN: [id number]   Subjective:   HPI: [name] is a 52 y.o. gentleman with past medical history of hypertension, bipolar disorder, diastolic heart failure, and a recent clinic visit for headache, presents to the clinic for followup visit.  Patient evaluated for his headache on 07/03/2013 and discharged on Vicodin. He ran out of his Vicodin 3 days ago. He is also inquiring about his recent brain MRI. Since running out of Vicodin his headache has returned, and has been pounding, frontal, 10 out of 10, and he has been unable to sleep. He reports that he is to take one pill of Vicodin 5-325 mg every 12 hours and that gave him good relief. He denies interim symptoms of neurological nature. He has not noticed any aggravating or relieving factors. He reports photophobia, without neck stiffness. No fevers, chills, increased fatigue, nausea, or vomiting. Again, he denies neurological symptoms, like, seizures, confusion, extremity weakness, tingling, or numbness.  Patient reveals that he takes his Coreg 25 mg once a day as opposed to twice a day as prescribed. He is unsure how he takes his Imdur. Patient reports that his wife helps him with his medications. Patient has a history of noncompliance and he seemed confused about his all medications.    Past Medical History  Diagnosis Date  . Hypertension   . Bipolar disorder   . Schizophrenia     h/o hospitalized inpt therapy @ the [facility]  . Valvular heart disease     a. 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 b. 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  Marland Kitchen Hyperlipidemia   . H/O: suicide attempt 1998    was hospitalized at [facility]  . Anemia     Hx of iron infusions  . Gastroparesis   . Arthritis   . Cocaine abuse     per pt none x 3 yrs however continues to "handle" it and deal it with + UDS.  Marland Kitchen Anxiety   . CHF (congestive heart failure)     a. EF 25-30% by echo 12/02/12, but 55% by TEE 12/04/12.  Marland Kitchen GERD (gastroesophageal reflux disease)   . Depression   . Vertigo   . Itchy skin   . Peripheral neuropathy     BLE  . Bilateral lower abdominal cramping   . Shortness of breath     "periodically; happens at any time" (03/30/13)  . Generalized headaches     "monthly" (03/30/2013)  . Chronic kidney disease     has fistula, not on HD; "go to [facility] q 90 days to have kidneys checked out" (03/30/2013)  . Atrial tachycardia     a. dx 11/2012: tx with amiodarone, cardioversion (discharge rhythm: likely junctional).   Current Outpatient Prescriptions  Medication Sig Dispense Refill  . amLODipine (NORVASC) 10 MG tablet Take 1 tablet (10 mg total) by mouth daily.  30 tablet  6  . ARIPiprazole (ABILIFY) 5 MG tablet Take 5 mg by mouth daily.        Marland Kitchen aspirin EC 81 MG EC tablet Take 1 tablet (81 mg total) by mouth daily.      . budesonide-formoterol (SYMBICORT) 160-4.5 MCG/ACT inhaler Inhale 2 puffs into the lungs 2 (two) times daily.  1 Inhaler  1  . carvedilol (COREG) 25 MG tablet Take 1 tablet (25 mg total) by mouth 2 (two) times daily with a meal.  60 tablet  6  .  docusate sodium (COLACE) 100 MG capsule Take 100 mg by mouth 2 (two) times daily as needed. For constipation      . furosemide (LASIX) 40 MG tablet Take 1 tablet (40 mg total) by mouth 2 (two) times daily.  30 tablet  6  . hydrALAZINE (APRESOLINE) 100 MG tablet Take 1 tablet (100 mg total) by mouth 2 (two) times daily.  90 tablet  6  . HYDROcodone-acetaminophen (NORCO) 5-325 MG per tablet Take 1 tablet by mouth every 12 (twelve) hours.  10 tablet  0  . LORazepam (ATIVAN) 1 MG tablet Take one pill by mouth one hour before MRI.  1 tablet  0  . metoCLOPramide (REGLAN) 10 MG tablet Take 10 mg by mouth 4 (four) times daily.      Marland Kitchen omeprazole (PRILOSEC) 40 MG capsule Take 40 mg by mouth 2 (two) times daily as needed. For heart burn       No current facility-administered  medications for this visit.   Family History  Problem Relation Age of Onset  . Hypertension Mother   . Heart disease Mother   . Heart disease Brother    History   Social History  . Marital Status: Married    Spouse Name: N/A    Number of Children: N/A  . Years of Education: N/A   Occupational History  . disabled    Social History Main Topics  . Smoking status: Former Smoker -- 1.50 packs/day for 30 years    Types: Cigarettes    Quit date: 03/21/2011  . Smokeless tobacco: Never Used  . Alcohol Use: Yes     Comment: Quit alcohol in July of 2011  . Drug Use: Yes    Special: Cocaine, Marijuana     Comment: None since November "?2012" when had rectal bleeding and went to hospital  . Sexual Activity: Not Currently   Other Topics Concern  . None   Social History Narrative   09/09/2012   He lives with mother and is separated from wife. Daughter in [place] with 2 grandchildren. He denies any cigs, drugs, alcohol at present.    Review of Systems: Constitutional: Denies fever, chills, diaphoresis, appetite change and fatigue.  Respiratory: Denies SOB, DOE, cough, chest tightness, and wheezing.  Cardiovascular: No chest pain, denies palpitations and leg swelling. No PND, or orthopnea. Gastrointestinal: No abdominal pain, nausea, vomiting, bloody stools Genitourinary: No dysuria, frequency, hematuria, or flank pain.  Musculoskeletal: No myalgias, back pain, joint swelling, arthralgias. Patient reports some dizziness and he has been using his cane more lately. However, she denies falls.     Objective:  Physical Exam: Filed Vitals:   07/13/13 1211  BP: 136/80  Pulse: 130  Temp: 97.3 F (36.3 C)  TempSrc: Oral  Height: 5\' 6"  (1.676 m)  Weight: 157 lb 6.4 oz (71.396 kg)  SpO2: 99%   General: Well nourished. No acute distress.  Lungs: CTA bilaterally. Heart: Tachycardia noted. RRR; no extra sounds or murmurs  Abdomen: Non-distended, normal BS, soft, nontender; no  hepatosplenomegaly  Extremities: No pedal edema. No joint swelling or tenderness. Neurologic: Alert and oriented x3. No obvious neurologic deficits.  Assessment & Plan:  I have discussed my assessment and plan  with Dr. Josem Kaufmann as detailed under problem based charting.

## 2013-07-13 NOTE — Patient Instructions (Addendum)
Please take your medications as prescribed    1. Carvediolol (Coreg) 25 mg twice a day   2. Hydralazine 100 mg twice a day   3. Please stop taking  Imdur 60 mg once a day .   Please take Norco for your headaches  You MRI was normal  Please come back to clinic in two weeks  Please follow up with your heart doctor as needed.

## 2013-07-13 NOTE — Assessment & Plan Note (Signed)
Patient's pulse rate in the office was 130. He denied any increased symptoms of palpitations, chest pain, shortness of breath. However, reported some dizziness. No falls. EKG revealed sinus tachycardia with VR of 129.  Patient does not take his Coreg as prescribed at 25 mg twice a day. He reported that he takes it once a day. They sinus tachycardia might be a result of withdrawal from his beta blockers in the setting of atrial tachycardia with variable block.  Plan. -Emphasized to the patient take his Coreg twice a day as prescribed. -Advised the patient that if he developed symptoms of shortness of breath, chest pain, or increasing dizziness to present to the ED or call his cardiologist office for further instructions.

## 2013-07-13 NOTE — Assessment & Plan Note (Signed)
Etiology of his headache is still unclear. No neurological symptoms. Brain MRI is negative. Reports relief with Vicodin. It is likely that his headache is also his oral a side effect of Imdur, which is not uncommon.  Plan. -Discontinued Imdur for now. Patient's ejection fraction is largely preserved at 50-55% -Given him 10 pills of Vicodin to last him 5 days. -If pain persists after discontinuation of Imdur, then other causes should be considered. -Advised patient to take his medications, including Coreg, and hydralazine, as prescribed. - he will follow up in two weeks for reevaluation

## 2013-07-13 NOTE — Assessment & Plan Note (Signed)
ECHO in May/2014 revealed an ejection fraction of 50-55%. Patient's current severe headache, might be related to the Imdur as a side effect. Headache is reported in 13- 35% in patient's on Imdur. Physical exam does not reveal fluid overload at this time.  Plan. -We will hold Imdur at least temporally to see how his headache does  - I emphasized to the patient take his Coreg twice a day is prescribed as opposed to once daily. Patient verbalized understanding. - Will followup with cardiology

## 2013-07-14 NOTE — Progress Notes (Signed)
Case discussed with Dr. Kazibwe at the time of the visit.  We reviewed the resident's history and exam and pertinent patient test results.  I agree with the assessment, diagnosis and plan of care documented in the resident's note. 

## 2013-07-16 ENCOUNTER — Other Ambulatory Visit (HOSPITAL_COMMUNITY): Payer: Self-pay

## 2013-07-17 ENCOUNTER — Encounter (HOSPITAL_COMMUNITY): Payer: Self-pay

## 2013-07-27 ENCOUNTER — Encounter: Payer: Self-pay | Admitting: Internal Medicine

## 2013-07-27 ENCOUNTER — Ambulatory Visit (INDEPENDENT_AMBULATORY_CARE_PROVIDER_SITE_OTHER): Payer: Medicaid Other | Admitting: Internal Medicine

## 2013-07-27 VITALS — BP 109/60 | HR 87 | Temp 97.0°F | Ht 66.0 in | Wt 155.3 lb

## 2013-07-27 DIAGNOSIS — R51 Headache: Secondary | ICD-10-CM

## 2013-07-27 DIAGNOSIS — Z0000 Encounter for general adult medical examination without abnormal findings: Secondary | ICD-10-CM

## 2013-07-27 DIAGNOSIS — I5043 Acute on chronic combined systolic (congestive) and diastolic (congestive) heart failure: Secondary | ICD-10-CM

## 2013-07-27 DIAGNOSIS — I509 Heart failure, unspecified: Secondary | ICD-10-CM

## 2013-07-27 MED ORDER — ACETAMINOPHEN 325 MG PO TABS: 650.0000 mg | ORAL_TABLET | Freq: Three times a day (TID) | ORAL | Status: DC | PRN

## 2013-07-27 NOTE — Assessment & Plan Note (Signed)
After discontinuation of Imdur, Patient's congestive heart failure is stable. He does not have worsening shortness of breath, chest pain or leg edema. He is clinically euvolemic today. Currently taking Lasix 40 mg twice a day.  -will continue Lasix 40 mg twice a day, aspirin, Coreg and hydralazine.

## 2013-07-27 NOTE — Assessment & Plan Note (Signed)
After discontinuation of Imdur, his headache improved significantly. Today his headache is very mild which does not bother him much anymore. His recent MRI did not show any new acute abnormalities.   -will not restart Imdur. -will treat with Tylenol prn for mild headache.

## 2013-07-27 NOTE — Patient Instructions (Signed)
1. Please take tylenol for your mild headache from now. You do not need hydrocodone any more. 2. Please take all medications as prescribed.  3. If you have worsening of your symptoms or new symptoms arise, please call the clinic ([phone]), or go to the ER immediately if symptoms are severe.

## 2013-07-27 NOTE — Assessment & Plan Note (Signed)
-  Patient refused flu shot today. Will postpone.  

## 2013-07-27 NOTE — Progress Notes (Signed)
Patient ID: Ryan Winters, male   DOB: [birth year], 52 y.o.   MRN: [id number] Subjective:   Patient ID: Ryan Winters male   DOB: 15-Oct-1961 52 y.o.   MRN: [id number]  CC:  Follow up visit for headache.  HPI:  [name] is a 52 y.o.  Man with past medical history as outlined below, who presents for a followup visit today  1. Headache: Patient was seen in clinic on 8/15 and 8/25 due to headache. He had MRI of brain on 06/10/13, which showed no acute intracranial findings with tiny foci of chronic hemorrhage  and small remote left cerebellar infarct. It could represent manifestations of embolic or hypertensive cerebrovascular disease per radiologist. His headache was thought to be due to the side effect of Imdur, which was discontinued in his previous visit. Today patient reports that after discontinuation of his Imdur, his headache has almost resolved. Today, he reports having very mild headache which does not bother her much any more. He does not have weakness, numbness or tingling sensations in his extremities.  2. CHF: Patient is currently taking Coreg, aspirin, hydralazine and lasix 40 mg bid. After discontinuation of Imdur, he is doing well. His shortness of breath is at his baseline. He does not chest pain, palpitations or leg edema.   ROS:  Denies fever, chills, fatigue, cough, chest pain, abdominal pain, diarrhea, constipation, dysuria, urgency, frequency, hematuria.   Past Medical History  Diagnosis Date  . Hypertension   . Bipolar disorder   . Schizophrenia     h/o hospitalized inpt therapy @ the [facility]  . Valvular heart disease     a. 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 b. 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  Ryan Winters Hyperlipidemia   . H/O: suicide attempt 1998    was hospitalized at [facility]  . Anemia     Hx of iron infusions  . Gastroparesis   . Arthritis   . Cocaine abuse     per pt none x 3 yrs however continues to  "handle" it and deal it with + UDS.  Ryan Winters Anxiety   . CHF (congestive heart failure)     a. EF 25-30% by echo 12/02/12, but 55% by TEE 12/04/12.  Ryan Winters GERD (gastroesophageal reflux disease)   . Depression   . Vertigo   . Itchy skin   . Peripheral neuropathy     BLE  . Bilateral lower abdominal cramping   . Shortness of breath     "periodically; happens at any time" (03/30/13)  . Generalized headaches     "monthly" (03/30/2013)  . Chronic kidney disease     has fistula, not on HD; "go to [facility] q 90 days to have kidneys checked out" (03/30/2013)  . Atrial tachycardia     a. dx 11/2012: tx with amiodarone, cardioversion (discharge rhythm: likely junctional).   Current Outpatient Prescriptions  Medication Sig Dispense Refill  . amLODipine (NORVASC) 10 MG tablet Take 1 tablet (10 mg total) by mouth daily.  30 tablet  6  . ARIPiprazole (ABILIFY) 5 MG tablet Take 5 mg by mouth daily.        Ryan Winters aspirin EC 81 MG EC tablet Take 1 tablet (81 mg total) by mouth daily.      . carvedilol (COREG) 25 MG tablet Take 1 tablet (25 mg total) by mouth 2 (two) times daily with a meal.  60 tablet  6  . furosemide (LASIX) 40 MG tablet Take 1  tablet (40 mg total) by mouth 2 (two) times daily.  30 tablet  6  . hydrALAZINE (APRESOLINE) 100 MG tablet Take 1 tablet (100 mg total) by mouth 2 (two) times daily.  90 tablet  6  . omeprazole (PRILOSEC) 40 MG capsule Take 40 mg by mouth 2 (two) times daily as needed. For heart burn      . acetaminophen (TYLENOL) 325 MG tablet Take 2 tablets (650 mg total) by mouth every 8 (eight) hours as needed for pain.  100 tablet  2   No current facility-administered medications for this visit.   Family History  Problem Relation Age of Onset  . Hypertension Mother   . Heart disease Mother   . Heart disease Brother    History   Social History  . Marital Status: Married    Spouse Name: N/A    Number of Children: N/A  . Years of Education: N/A   Occupational History  .  disabled    Social History Main Topics  . Smoking status: Former Smoker -- 1.50 packs/day for 30 years    Types: Cigarettes    Quit date: 03/21/2011  . Smokeless tobacco: Never Used  . Alcohol Use: Yes     Comment: Quit alcohol in July of 2011  . Drug Use: Yes    Special: Cocaine, Marijuana     Comment: None since November "?2012" when had rectal bleeding and went to hospital  . Sexual Activity: Not Currently   Other Topics Concern  . None   Social History Narrative   09/09/2012   He lives with mother and is separated from wife. Daughter in [place] with 2 grandchildren. He denies any cigs, drugs, alcohol at present.     Review of Systems: Full 14-point review of systems otherwise negative. See HPI.   Objective:  Physical Exam: Filed Vitals:   07/27/13 1452  BP: 109/60  Pulse: 87  Temp: 97 F (36.1 C)  TempSrc: Oral  Height: 5\' 6"  (1.676 m)  Weight: 155 lb 4.8 oz (70.444 kg)  SpO2: 100%    General: Well nourished. No acute distress.   Lungs: CTA bilaterally. Heart: Irregular rhythm, no extra sounds or murmurs  Abdomen: Non-distended, normal BS, soft, nontender; no hepatosplenomegaly  Extremities: No pedal edema. No joint swelling or tenderness. Neurologic: Alert and oriented x3. No obvious neurologic deficits.   Assessment & Plan:

## 2013-07-28 NOTE — Progress Notes (Signed)
Case discussed with Dr. Niu at the time of the visit.  We reviewed the resident's history and exam and pertinent patient test results.  I agree with the assessment, diagnosis, and plan of care documented in the resident's note.    

## 2013-08-05 ENCOUNTER — Encounter: Payer: Self-pay | Admitting: Cardiovascular Disease

## 2013-08-06 ENCOUNTER — Other Ambulatory Visit: Payer: Self-pay | Admitting: *Deleted

## 2013-08-06 NOTE — Telephone Encounter (Signed)
Pt states it is for his gastroparesis

## 2013-08-07 ENCOUNTER — Other Ambulatory Visit (HOSPITAL_COMMUNITY): Payer: Self-pay | Admitting: *Deleted

## 2013-08-10 ENCOUNTER — Inpatient Hospital Stay (HOSPITAL_COMMUNITY): Admission: RE | Admit: 2013-08-10 | Payer: Self-pay | Source: Ambulatory Visit

## 2013-08-11 ENCOUNTER — Other Ambulatory Visit (INDEPENDENT_AMBULATORY_CARE_PROVIDER_SITE_OTHER): Payer: Self-pay | Admitting: Surgery

## 2013-08-11 NOTE — Telephone Encounter (Signed)
Has not been seen since 02/2012

## 2013-08-17 ENCOUNTER — Encounter (HOSPITAL_COMMUNITY): Payer: Self-pay

## 2013-08-18 ENCOUNTER — Ambulatory Visit (HOSPITAL_COMMUNITY)
Admission: RE | Admit: 2013-08-18 | Discharge: 2013-08-18 | Disposition: A | Discharge status: Home / Self Care | Payer: Medicaid Other | Source: Ambulatory Visit | Attending: Obstetrics and Gynecology | Admitting: Obstetrics and Gynecology

## 2013-08-18 ENCOUNTER — Ambulatory Visit (INDEPENDENT_AMBULATORY_CARE_PROVIDER_SITE_OTHER): Payer: Medicaid Other | Admitting: Internal Medicine

## 2013-08-18 VITALS — BP 106/70 | HR 121 | Temp 97.0°F | Ht 66.25 in | Wt 152.0 lb

## 2013-08-18 DIAGNOSIS — R51 Headache: Secondary | ICD-10-CM

## 2013-08-18 DIAGNOSIS — R6882 Decreased libido: Secondary | ICD-10-CM

## 2013-08-18 DIAGNOSIS — K3184 Gastroparesis: Secondary | ICD-10-CM

## 2013-08-18 DIAGNOSIS — L84 Corns and callosities: Secondary | ICD-10-CM

## 2013-08-18 DIAGNOSIS — I471 Supraventricular tachycardia: Secondary | ICD-10-CM

## 2013-08-18 DIAGNOSIS — I5043 Acute on chronic combined systolic (congestive) and diastolic (congestive) heart failure: Secondary | ICD-10-CM

## 2013-08-18 DIAGNOSIS — R9431 Abnormal electrocardiogram [ECG] [EKG]: Secondary | ICD-10-CM | POA: Insufficient documentation

## 2013-08-18 DIAGNOSIS — R000 Tachycardia, unspecified: Secondary | ICD-10-CM | POA: Insufficient documentation

## 2013-08-18 MED ORDER — METOCLOPRAMIDE HCL 10 MG PO TABS: 10.0000 mg | ORAL_TABLET | Freq: Four times a day (QID) | ORAL | Status: DC

## 2013-08-18 NOTE — Patient Instructions (Addendum)
Today I refilled your prescription for metoclopramide for your gastroparesis.  Also, we checked an EKG of your heart. Your heart had no changes from one month ago. Since you have just recently seen your cardiologist and your symptoms have not changed we do not think you need to see your cardiologist immediately. Please let us know if your symptoms worsen, and we are happy to make the referral.  Today, we referred you to a foot doctor for the callus on your right foot. Make sure to use good foot ware until you are able to see the foot doctor.  Corns and Calluses Corns are small areas of thickened skin that usually occur on the top, sides, or tip of a toe. They contain a cone-shaped core with a point that can press on a nerve below. This causes pain. Calluses are areas of thickened skin that usually develop on hands, fingers, palms, soles of the feet, and heels. These are areas that experience frequent friction or pressure. CAUSES  Corns are usually the result of rubbing (friction) or pressure from shoes that are too tight or do not fit properly. Calluses are caused by repeated friction and pressure on the affected areas. SYMPTOMS  A hard growth on the skin.  Pain or tenderness under the skin.  Sometimes, redness and swelling.  Increased discomfort while wearing tight-fitting shoes. DIAGNOSIS  Your caregiver can usually tell what the problem is by doing a physical exam. TREATMENT  Removing the cause of the friction or pressure is usually the only treatment needed. However, sometimes medicines can be used to help soften the hardened, thickened areas. These medicines include salicylic acid plasters and 12% ammonium lactate lotion. These medicines should only be used under the direction of your caregiver. HOME CARE INSTRUCTIONS   Try to remove pressure from the affected area.  You may wear donut-shaped corn pads to protect your skin.  You may use a pumice stone or nonmetallic nail file to  gently reduce the thickness of a corn.  Wear properly fitted footwear.  If you have calluses on the hands, wear gloves during activities that cause friction.  If you have diabetes, you should regularly examine your feet. Tell your caregiver if you notice any problems with your feet. SEEK IMMEDIATE MEDICAL CARE IF:   You have increased pain, swelling, redness, or warmth in the affected area.  Your corn or callus starts to drain fluid or bleeds.  You are not getting better, even with treatment. Document Released: 08/11/2004 Document Revised: 01/28/2012 Document Reviewed: 07/03/2011 [facility] Patient Information 2014 [place], [place].

## 2013-08-18 NOTE — Progress Notes (Addendum)
Subjective:   Patient ID: Ryan Winters male   DOB: 03-30-1961 52 y.o.   MRN: [id number]  HPI: Ryan Winters is a 52 y.o. man who presents for f/u of his chronic medical problems. See a+p charting below.    Past Medical History  Diagnosis Date  . Hypertension   . Bipolar disorder   . Schizophrenia     h/o hospitalized inpt therapy @ the [facility]  . Valvular heart disease     a. 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 b. 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  Marland Kitchen Hyperlipidemia   . H/O: suicide attempt 1998    was hospitalized at [facility]  . Anemia     Hx of iron infusions  . Gastroparesis   . Arthritis   . Cocaine abuse     per pt none x 3 yrs however continues to "handle" it and deal it with + UDS.  Marland Kitchen Anxiety   . CHF (congestive heart failure)     a. EF 25-30% by echo 12/02/12, but 55% by TEE 12/04/12.  Marland Kitchen GERD (gastroesophageal reflux disease)   . Depression   . Vertigo   . Itchy skin   . Peripheral neuropathy     BLE  . Bilateral lower abdominal cramping   . Shortness of breath     "periodically; happens at any time" (03/30/13)  . Generalized headaches     "monthly" (03/30/2013)  . Chronic kidney disease     has fistula, not on HD; "go to [facility] q 90 days to have kidneys checked out" (03/30/2013)  . Atrial tachycardia     a. dx 11/2012: tx with amiodarone, cardioversion (discharge rhythm: likely junctional).   Current Outpatient Prescriptions  Medication Sig Dispense Refill  . acetaminophen (TYLENOL) 325 MG tablet Take 2 tablets (650 mg total) by mouth every 8 (eight) hours as needed for pain.  100 tablet  2  . amLODipine (NORVASC) 10 MG tablet Take 1 tablet (10 mg total) by mouth daily.  30 tablet  6  . ARIPiprazole (ABILIFY) 5 MG tablet Take 5 mg by mouth daily.        Marland Kitchen aspirin EC 81 MG EC tablet Take 1 tablet (81 mg total) by mouth daily.      . carvedilol (COREG) 25 MG tablet Take 1 tablet (25 mg total) by  mouth 2 (two) times daily with a meal.  60 tablet  6  . furosemide (LASIX) 40 MG tablet Take 1 tablet (40 mg total) by mouth 2 (two) times daily.  30 tablet  6  . hydrALAZINE (APRESOLINE) 100 MG tablet Take 1 tablet (100 mg total) by mouth 2 (two) times daily.  90 tablet  6  . metoCLOPramide (REGLAN) 10 MG tablet Take 10 mg by mouth 4 (four) times daily.      Marland Kitchen omeprazole (PRILOSEC) 40 MG capsule Take 40 mg by mouth 2 (two) times daily as needed. For heart burn       No current facility-administered medications for this visit.   Family History  Problem Relation Age of Onset  . Hypertension Mother   . Heart disease Mother   . Heart disease Brother    History   Social History  . Marital Status: Married    Spouse Name: N/A    Number of Children: N/A  . Years of Education: N/A   Occupational History  . disabled    Social History Main Topics  . Smoking status:  Former Smoker -- 1.50 packs/day for 30 years    Types: Cigarettes    Quit date: 03/21/2011  . Smokeless tobacco: Never Used  . Alcohol Use: Yes     Comment: Quit alcohol in July of 2011  . Drug Use: Yes    Special: Cocaine, Marijuana     Comment: None since November "?2012" when had rectal bleeding and went to hospital  . Sexual Activity: Not Currently   Other Topics Concern  . Not on file   Social History Narrative   09/09/2012   He lives with mother and is separated from wife. Daughter in [place] with 2 grandchildren. He denies any cigs, drugs, alcohol at present.    Review of Systems: Review of Systems  Constitutional: Negative for fever, chills, weight loss, malaise/fatigue and diaphoresis.  HENT: Negative for sore throat.   Eyes: Negative for blurred vision, double vision and photophobia.  Respiratory: Positive for cough and shortness of breath.   Cardiovascular: Positive for chest pain and orthopnea.  Gastrointestinal: Negative for heartburn, nausea, vomiting, abdominal pain, diarrhea and constipation.    Genitourinary: Negative for dysuria, urgency and frequency.  Skin: Negative for itching and rash.  Neurological: Negative for dizziness, tingling, tremors, weakness and headaches.  Endo/Heme/Allergies: Negative for polydipsia.    Objective:  Physical Exam: Filed Vitals:   08/18/13 1333  BP: 106/70  Pulse: 121  Temp: 97 F (36.1 C)  TempSrc: Oral  Height: 5' 6.25" (1.683 m)  Weight: 152 lb (68.947 kg)  SpO2: 97%   Physical Exam  Constitutional: He is oriented to person, place, and time. He appears well-developed and well-nourished. No distress.  HENT:  Head: Normocephalic.  Mouth/Throat: Oropharynx is clear and moist. No oropharyngeal exudate.  Eyes: EOM are normal. Pupils are equal, round, and reactive to light.  Cardiovascular: Exam reveals no friction rub.   No murmur heard. Tachycardic irregular rhythm.  Pulmonary/Chest: Effort normal and breath sounds normal. No respiratory distress. He has no wheezes. He has no rales.  Abdominal: Soft. Bowel sounds are normal. He exhibits no distension. There is no tenderness.  Neurological: He is alert and oriented to person, place, and time.  Skin: He is not diaphoretic.  Psychiatric: He has a normal mood and affect. His behavior is normal.  Callus located on lateral aspect of the patients 5th toes (right foot). There is no erythema, drainage or warmness. It is mildly tender to palpation.  Assessment & Plan:

## 2013-08-19 DIAGNOSIS — L84 Corns and callosities: Secondary | ICD-10-CM | POA: Insufficient documentation

## 2013-08-19 DIAGNOSIS — R6882 Decreased libido: Secondary | ICD-10-CM | POA: Insufficient documentation

## 2013-08-19 NOTE — Assessment & Plan Note (Signed)
A: The patient's abdominal pain with eating has resolved since restarting metoclopramide on 06/18/13. He is requesting a refill of the medication today.  P: Continue current management with metoclopramide.

## 2013-08-19 NOTE — Assessment & Plan Note (Signed)
A: The patients SOB, exertional dyspnea, and angina are at baseline. He denies LE edema. Thus, his CHF appears to be stable on current medical therapy.  P: Advised the patient to get medical attention if his SOB, chest pain worsens.

## 2013-08-19 NOTE — Assessment & Plan Note (Signed)
A: Since stopping Imdur the patient has not had any headaches. He is off pain medication.   P: Do not restart imdur.

## 2013-08-19 NOTE — Assessment & Plan Note (Addendum)
A: The patient had tachycardia of 121 upon arrival which quickly decreased to 84 with rest. The patient continues to take Coreg 1 x per day instead of 2x per day. EKG demontrated an irregular rhythm that may have been atrial flutter decomposing to atrial fibrillation. This appears similar to previous EKGs.  P: Continue Coreg 25 mg BID. Advised the patient to get medical attention if his SOB, chest pain worsen.

## 2013-08-19 NOTE — Assessment & Plan Note (Signed)
A: The patient has a callus on his right foot that has been causing him pain for the last week. There has been no drainage redness or other signs of infection.  P: Referred patient to podiatry and gave recommendations on discharge document regarding home care of calluses.

## 2013-08-20 ENCOUNTER — Encounter (HOSPITAL_COMMUNITY): Payer: Self-pay

## 2013-09-07 ENCOUNTER — Ambulatory Visit (INDEPENDENT_AMBULATORY_CARE_PROVIDER_SITE_OTHER): Payer: Medicaid Other

## 2013-09-07 ENCOUNTER — Ambulatory Visit (INDEPENDENT_AMBULATORY_CARE_PROVIDER_SITE_OTHER): Payer: Medicaid Other | Admitting: Podiatry

## 2013-09-07 ENCOUNTER — Encounter: Payer: Self-pay | Admitting: Podiatry

## 2013-09-07 VITALS — BP 152/98 | HR 108 | Temp 97.6°F | Resp 12 | Ht 66.0 in | Wt 164.0 lb

## 2013-09-07 DIAGNOSIS — R52 Pain, unspecified: Secondary | ICD-10-CM

## 2013-09-07 DIAGNOSIS — M2040 Other hammer toe(s) (acquired), unspecified foot: Secondary | ICD-10-CM

## 2013-09-07 DIAGNOSIS — M2030 Hallux varus (acquired), unspecified foot: Secondary | ICD-10-CM

## 2013-09-07 DIAGNOSIS — L84 Corns and callosities: Secondary | ICD-10-CM

## 2013-09-07 NOTE — Progress Notes (Signed)
  Subjective:    Patient ID: Ryan Winters, male    DOB: 07/27/1961, 52 y.o.   MRN: [id number]  HPI Comments: RT FOOT 5TH '' TOE IS HURTING , ESPECIALLY WHEN PUTTING PRESSURE AND ITS BEEN HURTING FOR 2-3 WEEKS''  Toe Pain    Medical history: Includes Hypertension, GERD chronic kidney disease, anemia, schizophrenia bipolar disorder Chromic medication: See medication list  Allergies: See allergies listed  Review of Systems  Constitutional: Negative.   HENT: Negative.   Eyes: Negative.   Respiratory: Positive for wheezing.   Cardiovascular: Positive for chest pain.  Gastrointestinal: Negative.   Endocrine: Negative.   Genitourinary: Positive for frequency.  Musculoskeletal: Negative.   Skin: Negative.   Allergic/Immunologic: Positive for food allergies.  Neurological: Positive for dizziness.  Hematological: Negative.   Psychiatric/Behavioral: Negative.        Objective:   Physical Exam  A 52 year old black male appears to be orientated x3.  Vascular the DP and PT pulses are two over four bilaterally. Capillary fill is immediate bilaterally.  Neurological: knee and ankle reflexes equal and reactive bilaterally  Dermatological: Dry scaly skin noted bilaterally. Keratoses noted on the fifth right and left toes.  Musculoskeletal: Bilateral HAV bunion is noted. Varus rotation is noted fifth digits bilaterally.       Assessment & Plan:  Hammertoe deformities fifth digits bilaterally. Keratoses fifth digit right foot.  The keratoses on the fifth digit was debrided without any bleeding and a protective pad applied. The patient was advised to cut the shoe out over the fifth right toe when wearing a tight dress shoe if needed.  Reappoint at patient's request.  Richard C.Leeanne Deed, DPM

## 2013-09-07 NOTE — Progress Notes (Signed)
N  SORE L   RT. FOOT 5TH TOE D   2-3 WEEKS O  SLOWLY C  WORSE A  PRESSURE T  NONE

## 2013-09-07 NOTE — Patient Instructions (Signed)
Wear non medicated corn pad on the 5th right toe as needed. Cut old shoe out over the 5th toe if needed.

## 2013-09-08 NOTE — Progress Notes (Signed)
I saw and evaluated the patient.  I personally confirmed the key portions of the history and exam documented by Dr. Komanski and I reviewed pertinent patient test results.  The assessment, diagnosis, and plan were formulated together and I agree with the documentation in the resident's note.  

## 2013-09-10 ENCOUNTER — Emergency Department (HOSPITAL_COMMUNITY): Payer: Medicaid Other

## 2013-09-10 ENCOUNTER — Encounter (HOSPITAL_COMMUNITY): Payer: Self-pay | Admitting: Emergency Medicine

## 2013-09-10 ENCOUNTER — Emergency Department (HOSPITAL_COMMUNITY)
Admission: EM | Admit: 2013-09-10 | Discharge: 2013-09-10 | Discharge status: Against Medical Advice | Payer: Medicaid Other | Attending: Emergency Medicine | Admitting: Emergency Medicine

## 2013-09-10 DIAGNOSIS — M129 Arthropathy, unspecified: Secondary | ICD-10-CM | POA: Insufficient documentation

## 2013-09-10 DIAGNOSIS — R1110 Vomiting, unspecified: Secondary | ICD-10-CM | POA: Insufficient documentation

## 2013-09-10 DIAGNOSIS — I471 Supraventricular tachycardia: Secondary | ICD-10-CM

## 2013-09-10 DIAGNOSIS — I498 Other specified cardiac arrhythmias: Secondary | ICD-10-CM | POA: Insufficient documentation

## 2013-09-10 DIAGNOSIS — F319 Bipolar disorder, unspecified: Secondary | ICD-10-CM | POA: Insufficient documentation

## 2013-09-10 DIAGNOSIS — F209 Schizophrenia, unspecified: Secondary | ICD-10-CM | POA: Insufficient documentation

## 2013-09-10 DIAGNOSIS — R059 Cough, unspecified: Secondary | ICD-10-CM | POA: Insufficient documentation

## 2013-09-10 DIAGNOSIS — R079 Chest pain, unspecified: Secondary | ICD-10-CM

## 2013-09-10 DIAGNOSIS — N189 Chronic kidney disease, unspecified: Secondary | ICD-10-CM | POA: Insufficient documentation

## 2013-09-10 DIAGNOSIS — Z8639 Personal history of other endocrine, nutritional and metabolic disease: Secondary | ICD-10-CM | POA: Insufficient documentation

## 2013-09-10 DIAGNOSIS — I509 Heart failure, unspecified: Secondary | ICD-10-CM | POA: Insufficient documentation

## 2013-09-10 DIAGNOSIS — I129 Hypertensive chronic kidney disease with stage 1 through stage 4 chronic kidney disease, or unspecified chronic kidney disease: Secondary | ICD-10-CM | POA: Insufficient documentation

## 2013-09-10 DIAGNOSIS — Z9889 Other specified postprocedural states: Secondary | ICD-10-CM | POA: Insufficient documentation

## 2013-09-10 DIAGNOSIS — R05 Cough: Secondary | ICD-10-CM

## 2013-09-10 DIAGNOSIS — F411 Generalized anxiety disorder: Secondary | ICD-10-CM | POA: Insufficient documentation

## 2013-09-10 DIAGNOSIS — Z79899 Other long term (current) drug therapy: Secondary | ICD-10-CM | POA: Insufficient documentation

## 2013-09-10 DIAGNOSIS — Z8669 Personal history of other diseases of the nervous system and sense organs: Secondary | ICD-10-CM | POA: Insufficient documentation

## 2013-09-10 DIAGNOSIS — Z862 Personal history of diseases of the blood and blood-forming organs and certain disorders involving the immune mechanism: Secondary | ICD-10-CM | POA: Insufficient documentation

## 2013-09-10 LAB — CBC WITH DIFFERENTIAL/PLATELET
Basophils Absolute: 0 10*3/uL (ref 0.0–0.1)
Eosinophils Relative: 15 % — ABNORMAL HIGH (ref 0–5)
Lymphocytes Relative: 14 % (ref 12–46)
MCHC: 33.7 g/dL (ref 30.0–36.0)
MCV: 78.2 fL (ref 78.0–100.0)
Neutro Abs: 4.2 10*3/uL (ref 1.7–7.7)
Neutrophils Relative %: 62 % (ref 43–77)
Platelets: 107 10*3/uL — ABNORMAL LOW (ref 150–400)
RDW: 20.7 % — ABNORMAL HIGH (ref 11.5–15.5)
WBC: 6.7 10*3/uL (ref 4.0–10.5)

## 2013-09-10 LAB — COMPREHENSIVE METABOLIC PANEL
ALT: 25 U/L (ref 0–53)
AST: 27 U/L (ref 0–37)
Alkaline Phosphatase: 148 U/L — ABNORMAL HIGH (ref 39–117)
BUN: 48 mg/dL — ABNORMAL HIGH (ref 6–23)
CO2: 18 mEq/L — ABNORMAL LOW (ref 19–32)
Calcium: 8.9 mg/dL (ref 8.4–10.5)
GFR calc Af Amer: 21 mL/min — ABNORMAL LOW (ref >90)
GFR calc non Af Amer: 18 mL/min — ABNORMAL LOW (ref >90)
Potassium: 3.7 mEq/L (ref 3.5–5.1)
Sodium: 135 mEq/L (ref 135–145)
Total Bilirubin: 1.1 mg/dL (ref 0.3–1.2)
Total Protein: 6.8 g/dL (ref 6.0–8.3)

## 2013-09-10 LAB — CG4 I-STAT (LACTIC ACID): Lactic Acid, Venous: 0.8 mmol/L (ref 0.5–2.2)

## 2013-09-10 LAB — PROTIME-INR: INR: 1.34 (ref 0.00–1.49)

## 2013-09-10 LAB — POCT I-STAT TROPONIN I
Troponin i, poc: 0.06 ng/mL (ref 0.00–0.08)
Troponin i, poc: 0.03 ng/mL (ref 0.00–0.08)

## 2013-09-10 MED ORDER — ASPIRIN 81 MG PO CHEW
324.0000 mg | CHEWABLE_TABLET | Freq: Once | ORAL | Status: AC
  Administered 2013-09-10: 324 mg via ORAL
  Filled 2013-09-10: qty 4

## 2013-09-10 MED ORDER — TECHNETIUM TC 99M DIETHYLENETRIAME-PENTAACETIC ACID: 40.0000 | Freq: Once | INTRAVENOUS | Status: DC | PRN

## 2013-09-10 MED ORDER — TECHNETIUM TO 99M ALBUMIN AGGREGATED
6.0000 | Freq: Once | INTRAVENOUS | Status: AC | PRN
  Administered 2013-09-10: 6 via INTRAVENOUS

## 2013-09-10 MED ORDER — FUROSEMIDE 10 MG/ML IJ SOLN
40.0000 mg | Freq: Once | INTRAMUSCULAR | Status: AC
  Administered 2013-09-10: 40 mg via INTRAVENOUS
  Filled 2013-09-10: qty 4

## 2013-09-10 MED ORDER — DILTIAZEM HCL 25 MG/5ML IV SOLN: 10.0000 mg | Freq: Once | INTRAVENOUS | Status: DC

## 2013-09-10 MED ORDER — METOPROLOL TARTRATE 1 MG/ML IV SOLN
5.0000 mg | Freq: Once | INTRAVENOUS | Status: AC
  Administered 2013-09-10: 5 mg via INTRAVENOUS
  Filled 2013-09-10: qty 5

## 2013-09-10 MED ORDER — CARVEDILOL 25 MG PO TABS
25.0000 mg | ORAL_TABLET | Freq: Two times a day (BID) | ORAL | Status: DC
  Administered 2013-09-10: 25 mg via ORAL
  Filled 2013-09-10 (×2): qty 1

## 2013-09-10 NOTE — ED Notes (Signed)
Cp x 2 -=3 days ago cough started first he states lots of mucus sweating a lot and has to sit up to breath hx of heart surgery and chf

## 2013-09-10 NOTE — ED Provider Notes (Signed)
TIME SEEN: 9:12 AM  CHIEF COMPLAINT: Chest pain, shortness of breath, cough  HPI: Patient is a 52 year old male with a history of hypertension, CHF, chronic kidney disease, atrial tachycardia, valve replacement, schizophrenia who presents the emergency department with several days of cough with yellow/green sputum production, shortness of breath, mild, sharp and achy chest pain that is worse with coughing, vomiting, chills. Denies any fever. No diarrhea. No sick contacts or recent travel. No lower extremity swelling. Chest pain does not radiate. No alleviating factors. No prior history of PE or DVT. No prolonged immobilization, fracture, trauma, surgery, hospitalization. No lower extremity swelling or pain. No history of CAD, MI. No recent diaphoresis, dizziness.  ROS: See HPI Constitutional: no fever  Eyes: no drainage  ENT: no runny nose   Cardiovascular:   chest pain  Resp:  SOB  GI: vomiting GU: no dysuria Integumentary: no rash  Allergy: no hives  Musculoskeletal: no leg swelling  Neurological: no slurred speech ROS otherwise negative  PAST MEDICAL HISTORY/PAST SURGICAL HISTORY:  Past Medical History  Diagnosis Date  . Hypertension   . Bipolar disorder   . Schizophrenia     h/o hospitalized inpt therapy @ the [facility]  . Valvular heart disease     a. 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 b. 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  Marland Kitchen Hyperlipidemia   . H/O: suicide attempt 1998    was hospitalized at [facility]  . Anemia     Hx of iron infusions  . Gastroparesis   . Arthritis   . Cocaine abuse     per pt none x 3 yrs however continues to "handle" it and deal it with + UDS.  Marland Kitchen Anxiety   . CHF (congestive heart failure)     a. EF 25-30% by echo 12/02/12, but 55% by TEE 12/04/12.  Marland Kitchen GERD (gastroesophageal reflux disease)   . Depression   . Vertigo   . Itchy skin   . Peripheral neuropathy     BLE  . Bilateral lower abdominal cramping    . Shortness of breath     "periodically; happens at any time" (03/30/13)  . Generalized headaches     "monthly" (03/30/2013)  . Chronic kidney disease     has fistula, not on HD; "go to [facility] q 90 days to have kidneys checked out" (03/30/2013)  . Atrial tachycardia     a. dx 11/2012: tx with amiodarone, cardioversion (discharge rhythm: likely junctional).    MEDICATIONS:  Prior to Admission medications   Medication Sig Start Date End Date Taking? Authorizing Provider  acetaminophen (TYLENOL) 325 MG tablet Take 2 tablets (650 mg total) by mouth every 8 (eight) hours as needed for pain. 07/27/13 07/27/14  Lorretta Harp, MD  amLODipine (NORVASC) 10 MG tablet Take 1 tablet (10 mg total) by mouth daily. 04/01/13   Ok Anis, NP  ARIPiprazole (ABILIFY) 5 MG tablet Take 5 mg by mouth daily.      Historical Provider, MD  aspirin EC 81 MG EC tablet Take 1 tablet (81 mg total) by mouth daily. 04/01/13   Ok Anis, NP  carvedilol (COREG) 25 MG tablet Take 1 tablet (25 mg total) by mouth 2 (two) times daily with a meal. 04/01/13   Ok Anis, NP  furosemide (LASIX) 40 MG tablet Take 1 tablet (40 mg total) by mouth 2 (two) times daily. 06/10/13   Pleas Koch, MD  hydrALAZINE (APRESOLINE) 100 MG tablet Take  1 tablet (100 mg total) by mouth 2 (two) times daily. 07/03/13   Rosalio Macadamia, NP  metoCLOPramide (REGLAN) 10 MG tablet Take 1 tablet (10 mg total) by mouth 4 (four) times daily. 08/18/13   Pleas Koch, MD  omeprazole (PRILOSEC) 40 MG capsule Take 40 mg by mouth 2 (two) times daily as needed. For heart burn 03/25/12   Ardeth Sportsman, MD    ALLERGIES:  Allergies  Allergen Reactions  . Cashew Nut Oil Anaphylaxis  . Food Anaphylaxis    Strawberries, Cashews, causes throat to swell - affects breathing    SOCIAL HISTORY:  History  Substance Use Topics  . Smoking status: Former Smoker -- 1.50 packs/day for 30 years    Types: Cigarettes    Quit date:  03/21/2011  . Smokeless tobacco: Never Used  . Alcohol Use: Yes     Comment: Quit alcohol in July of 2011    FAMILY HISTORY: Family History  Problem Relation Age of Onset  . Hypertension Mother   . Heart disease Mother   . Heart disease Brother     EXAM: BP 145/107  Pulse 121  Temp(Src) 98.6 F (37 C)  Resp 20  SpO2 97% CONSTITUTIONAL: Alert and oriented and responds appropriately to questions. Well-appearing; well-nourished HEAD: Normocephalic EYES: Conjunctivae clear, PERRL ENT: normal nose; no rhinorrhea; moist mucous membranes; pharynx without lesions noted NECK: Supple, no meningismus, no LAD  CARD: Tachycardic; S1 and S2 appreciated; no murmurs, no clicks, no rubs, no gallops RESP: Normal chest excursion without splinting or tachypnea; breath sounds clear and equal bilaterally; no wheezes, no rhonchi, no rales,  ABD/GI: Normal bowel sounds; non-distended; soft, non-tender, no rebound, no guarding BACK:  The back appears normal and is non-tender to palpation, there is no CVA tenderness EXT: Normal ROM in all joints; non-tender to palpation; no edema; normal capillary refill; no cyanosis    SKIN: Normal color for age and race; warm NEURO: Moves all extremities equally PSYCH: The patient's mood and manner are appropriate. Grooming and personal hygiene are appropriate.  MEDICAL DECISION MAKING: Patient here with cough, shortness of breath, chills and vomiting. He states he has sharp chest pain with coughing. His exam is unremarkable other than tachycardia. Will obtain chest x-ray, labs including d-dimer and BNP. We'll give aspirin. Patient does not have chest pain unless he is coughing.  PCP is internal medicine outpatient clinic. Cardiologist is Dr. Excell Seltzer.  ED PROGRESS: X-ray shows volume overload and BNP is greater than 15,000. We'll give IV Lasix. He is still having atrial tachycardia. Will give dose of metoprolol. Patient's d-dimer is also elevated. Given he has a  history of chronic kidney disease and is not on dialysis yet, will obtain VQ scan rather than CT chest. Patient will need admission.  12:25 PM  Pt updated with plan. He refuses admission. He understands that his heart failure is worse. When asked what his goals of care were, he states on here for "something to treat this cold". He agrees to obtaining VQ scan. States that his heart rate is fast and he is fluid overloaded because he has not had his Lasix or Coreg in 2 days.  Will give IV Lasix, IV metoprolol and home dose of carvedilol in ED. Explained to patient that I do not feel he needs antibiotic this time given he has no leukocytosis, fever or infiltrate seen on chest x-ray. Suspect that he is symptoms are due to a viral illness and his worsening heart failure and atrial  tachycardia is due to medication noncompliance.  3:09 PM  Pt reports feeling better after IV Lasix. He is still tachycardic in the 120s. This did not improve with one dose of IV metoprolol for his home dose of Coreg. Will repeat metoprolol. Have again recommended admission. VQ scan results pending.  3:34 PM  VQ scan reports very low probability of pulmonary embolus.  Patient is still tachycardic. He does not want admission to the hospital. Given his signs of volume overload, atrial tachycardia I have strongly encouraged him to stay and be admitted. Patient understands her risk of leaving the hospital AMA including worsening CHF, MI, death.  He is competent to make this decision and able to verbalize the risks back to me. Given strict return precautions.  Repeat troponin negative.  Pt verbalized understanding and is comfortable with this plan.  EKG Interpretation     Ventricular Rate:  122 PR Interval:  128 QRS Duration: 80 QT Interval:  320 QTC Calculation: 456 R Axis:   92 Text Interpretation:  Sinus tachycardia Rightward axis Left ventricular hypertrophy ST \\T \ T wave abnormality, consider inferior ischemia Abnormal ECG has  not changed             Layla Maw Alexiz Cothran, DO 09/10/13 1559

## 2013-09-10 NOTE — ED Notes (Signed)
Removed pts iv in right arm

## 2013-09-10 NOTE — ED Notes (Signed)
Pt. Requested to speak to doctor about antibiotics.  Marland Kitchen

## 2013-09-10 NOTE — ED Notes (Signed)
Results of lactic acid called to primary nurse Lawson Fiscal

## 2013-09-10 NOTE — ED Notes (Signed)
Pt. Given a box lunch.

## 2013-09-10 NOTE — ED Notes (Signed)
Pt. Given a second pillow and warm blankets.

## 2013-09-13 ENCOUNTER — Emergency Department (HOSPITAL_COMMUNITY): Payer: Medicaid Other

## 2013-09-13 ENCOUNTER — Inpatient Hospital Stay (HOSPITAL_COMMUNITY)
Admission: EM | Admit: 2013-09-13 | Discharge: 2013-09-14 | DRG: 153 | Disposition: A | Discharge status: Home / Self Care | Payer: Medicaid Other | Attending: Infectious Diseases | Admitting: Infectious Diseases

## 2013-09-13 ENCOUNTER — Encounter (HOSPITAL_COMMUNITY): Payer: Self-pay | Admitting: Emergency Medicine

## 2013-09-13 DIAGNOSIS — R9431 Abnormal electrocardiogram [ECG] [EKG]: Secondary | ICD-10-CM

## 2013-09-13 DIAGNOSIS — B9789 Other viral agents as the cause of diseases classified elsewhere: Secondary | ICD-10-CM | POA: Diagnosis present

## 2013-09-13 DIAGNOSIS — Z8249 Family history of ischemic heart disease and other diseases of the circulatory system: Secondary | ICD-10-CM

## 2013-09-13 DIAGNOSIS — D631 Anemia in chronic kidney disease: Secondary | ICD-10-CM

## 2013-09-13 DIAGNOSIS — I2789 Other specified pulmonary heart diseases: Secondary | ICD-10-CM | POA: Diagnosis present

## 2013-09-13 DIAGNOSIS — J069 Acute upper respiratory infection, unspecified: Principal | ICD-10-CM

## 2013-09-13 DIAGNOSIS — I471 Supraventricular tachycardia, unspecified: Secondary | ICD-10-CM

## 2013-09-13 DIAGNOSIS — K3184 Gastroparesis: Secondary | ICD-10-CM | POA: Diagnosis present

## 2013-09-13 DIAGNOSIS — G609 Hereditary and idiopathic neuropathy, unspecified: Secondary | ICD-10-CM | POA: Diagnosis present

## 2013-09-13 DIAGNOSIS — K219 Gastro-esophageal reflux disease without esophagitis: Secondary | ICD-10-CM

## 2013-09-13 DIAGNOSIS — F1410 Cocaine abuse, uncomplicated: Secondary | ICD-10-CM

## 2013-09-13 DIAGNOSIS — Z9119 Patient's noncompliance with other medical treatment and regimen: Secondary | ICD-10-CM

## 2013-09-13 DIAGNOSIS — E785 Hyperlipidemia, unspecified: Secondary | ICD-10-CM | POA: Diagnosis present

## 2013-09-13 DIAGNOSIS — Z954 Presence of other heart-valve replacement: Secondary | ICD-10-CM

## 2013-09-13 DIAGNOSIS — I5042 Chronic combined systolic (congestive) and diastolic (congestive) heart failure: Secondary | ICD-10-CM | POA: Diagnosis present

## 2013-09-13 DIAGNOSIS — F209 Schizophrenia, unspecified: Secondary | ICD-10-CM | POA: Diagnosis present

## 2013-09-13 DIAGNOSIS — F319 Bipolar disorder, unspecified: Secondary | ICD-10-CM

## 2013-09-13 DIAGNOSIS — Z7982 Long term (current) use of aspirin: Secondary | ICD-10-CM

## 2013-09-13 DIAGNOSIS — R079 Chest pain, unspecified: Secondary | ICD-10-CM

## 2013-09-13 DIAGNOSIS — I10 Essential (primary) hypertension: Secondary | ICD-10-CM

## 2013-09-13 DIAGNOSIS — Z9889 Other specified postprocedural states: Secondary | ICD-10-CM

## 2013-09-13 DIAGNOSIS — Z91199 Patient's noncompliance with other medical treatment and regimen due to unspecified reason: Secondary | ICD-10-CM

## 2013-09-13 DIAGNOSIS — R1110 Vomiting, unspecified: Secondary | ICD-10-CM

## 2013-09-13 DIAGNOSIS — F411 Generalized anxiety disorder: Secondary | ICD-10-CM | POA: Diagnosis present

## 2013-09-13 DIAGNOSIS — Z87891 Personal history of nicotine dependence: Secondary | ICD-10-CM

## 2013-09-13 DIAGNOSIS — I509 Heart failure, unspecified: Secondary | ICD-10-CM

## 2013-09-13 DIAGNOSIS — I129 Hypertensive chronic kidney disease with stage 1 through stage 4 chronic kidney disease, or unspecified chronic kidney disease: Secondary | ICD-10-CM | POA: Diagnosis present

## 2013-09-13 DIAGNOSIS — I5043 Acute on chronic combined systolic (congestive) and diastolic (congestive) heart failure: Secondary | ICD-10-CM

## 2013-09-13 DIAGNOSIS — N184 Chronic kidney disease, stage 4 (severe): Secondary | ICD-10-CM

## 2013-09-13 DIAGNOSIS — Z79899 Other long term (current) drug therapy: Secondary | ICD-10-CM

## 2013-09-13 LAB — BASIC METABOLIC PANEL
BUN: 46 mg/dL — ABNORMAL HIGH (ref 6–23)
Chloride: 106 mEq/L (ref 96–112)
GFR calc Af Amer: 23 mL/min — ABNORMAL LOW (ref >90)
GFR calc non Af Amer: 20 mL/min — ABNORMAL LOW (ref >90)
Glucose, Bld: 87 mg/dL (ref 70–99)
Potassium: 3.8 mEq/L (ref 3.5–5.1)

## 2013-09-13 LAB — TROPONIN I
Troponin I: <0.3 ng/mL (ref <0.30)
Troponin I: <0.3 ng/mL (ref <0.30)

## 2013-09-13 LAB — POCT I-STAT TROPONIN I: Troponin i, poc: 0.02 ng/mL (ref 0.00–0.08)

## 2013-09-13 LAB — CBC
HCT: 35 % — ABNORMAL LOW (ref 39.0–52.0)
Hemoglobin: 11.6 g/dL — ABNORMAL LOW (ref 13.0–17.0)
Hemoglobin: 10.9 g/dL — ABNORMAL LOW (ref 13.0–17.0)
MCHC: 33.1 g/dL (ref 30.0–36.0)
MCHC: 33.4 g/dL (ref 30.0–36.0)
Platelets: 142 10*3/uL — ABNORMAL LOW (ref 150–400)
Platelets: 120 10*3/uL — ABNORMAL LOW (ref 150–400)
RBC: 4.12 MIL/uL — ABNORMAL LOW (ref 4.22–5.81)
RDW: 21.6 % — ABNORMAL HIGH (ref 11.5–15.5)
RDW: 21.8 % — ABNORMAL HIGH (ref 11.5–15.5)
WBC: 6.4 10*3/uL (ref 4.0–10.5)
WBC: 5.5 10*3/uL (ref 4.0–10.5)

## 2013-09-13 LAB — RAPID URINE DRUG SCREEN, HOSP PERFORMED
Amphetamines: NOT DETECTED
Opiates: NOT DETECTED
Tetrahydrocannabinol: NOT DETECTED

## 2013-09-13 LAB — CREATININE, SERUM
Creatinine, Ser: 3.14 mg/dL — ABNORMAL HIGH (ref 0.50–1.35)
GFR calc Af Amer: 25 mL/min — ABNORMAL LOW (ref >90)
GFR calc non Af Amer: 21 mL/min — ABNORMAL LOW (ref >90)

## 2013-09-13 LAB — PRO B NATRIURETIC PEPTIDE: Pro B Natriuretic peptide (BNP): 3457 pg/mL — ABNORMAL HIGH (ref 0–125)

## 2013-09-13 MED ORDER — ALBUTEROL SULFATE HFA 108 (90 BASE) MCG/ACT IN AERS: 2.0000 | INHALATION_SPRAY | Freq: Four times a day (QID) | RESPIRATORY_TRACT | Status: DC | PRN

## 2013-09-13 MED ORDER — HYDRALAZINE HCL 50 MG PO TABS
100.0000 mg | ORAL_TABLET | Freq: Two times a day (BID) | ORAL | Status: DC
  Administered 2013-09-13 – 2013-09-14 (×3): 100 mg via ORAL
  Filled 2013-09-13 (×4): qty 2

## 2013-09-13 MED ORDER — ARIPIPRAZOLE 5 MG PO TABS
5.0000 mg | ORAL_TABLET | Freq: Every day | ORAL | Status: DC
  Administered 2013-09-13: 16:00:00 5 mg via ORAL
  Filled 2013-09-13 (×2): qty 1

## 2013-09-13 MED ORDER — SODIUM CHLORIDE 0.9 % IV SOLN
INTRAVENOUS | Status: DC
  Administered 2013-09-13: 15:00:00 via INTRAVENOUS

## 2013-09-13 MED ORDER — BENZONATATE 100 MG PO CAPS
100.0000 mg | ORAL_CAPSULE | Freq: Three times a day (TID) | ORAL | Status: DC
  Administered 2013-09-13 – 2013-09-14 (×3): 100 mg via ORAL
  Filled 2013-09-13 (×5): qty 1

## 2013-09-13 MED ORDER — METOCLOPRAMIDE HCL 5 MG PO TABS
5.0000 mg | ORAL_TABLET | Freq: Three times a day (TID) | ORAL | Status: DC
  Administered 2013-09-13 – 2013-09-14 (×3): 5 mg via ORAL
  Filled 2013-09-13 (×7): qty 1

## 2013-09-13 MED ORDER — ACETAMINOPHEN 325 MG PO TABS: 650.0000 mg | ORAL_TABLET | Freq: Four times a day (QID) | ORAL | Status: DC | PRN

## 2013-09-13 MED ORDER — HYDRALAZINE HCL 100 MG PO TABS: 100.0000 mg | ORAL_TABLET | Freq: Two times a day (BID) | ORAL | Status: DC

## 2013-09-13 MED ORDER — OXYCODONE-ACETAMINOPHEN 5-325 MG PO TABS
2.0000 | ORAL_TABLET | ORAL | Status: DC | PRN
  Administered 2013-09-13: 2 via ORAL
  Filled 2013-09-13: qty 2

## 2013-09-13 MED ORDER — FUROSEMIDE 40 MG PO TABS
40.0000 mg | ORAL_TABLET | Freq: Two times a day (BID) | ORAL | Status: DC
  Administered 2013-09-13: 40 mg via ORAL
  Filled 2013-09-13: qty 2
  Filled 2013-09-13: qty 1

## 2013-09-13 MED ORDER — SODIUM CHLORIDE 0.9 % IJ SOLN
3.0000 mL | Freq: Two times a day (BID) | INTRAMUSCULAR | Status: DC
  Administered 2013-09-13 (×2): 3 mL via INTRAVENOUS

## 2013-09-13 MED ORDER — CARVEDILOL 25 MG PO TABS
25.0000 mg | ORAL_TABLET | Freq: Two times a day (BID) | ORAL | Status: DC
  Administered 2013-09-13 – 2013-09-14 (×2): 25 mg via ORAL
  Filled 2013-09-13 (×4): qty 1

## 2013-09-13 MED ORDER — HYDROCOD POLST-CHLORPHEN POLST 10-8 MG/5ML PO LQCR
5.0000 mL | Freq: Two times a day (BID) | ORAL | Status: DC
  Administered 2013-09-13 – 2013-09-14 (×2): 5 mL via ORAL
  Filled 2013-09-13 (×3): qty 5

## 2013-09-13 MED ORDER — ENOXAPARIN SODIUM 30 MG/0.3ML ~~LOC~~ SOLN
30.0000 mg | SUBCUTANEOUS | Status: DC
  Filled 2013-09-13 (×2): qty 0.3

## 2013-09-13 MED ORDER — SODIUM CHLORIDE 0.9 % IJ SOLN
3.0000 mL | Freq: Two times a day (BID) | INTRAMUSCULAR | Status: DC
  Administered 2013-09-13: 3 mL via INTRAVENOUS

## 2013-09-13 MED ORDER — PANTOPRAZOLE SODIUM 40 MG PO TBEC
40.0000 mg | DELAYED_RELEASE_TABLET | Freq: Every day | ORAL | Status: DC
  Administered 2013-09-13 – 2013-09-14 (×2): 40 mg via ORAL
  Filled 2013-09-13: qty 1

## 2013-09-13 MED ORDER — HYPROMELLOSE (GONIOSCOPIC) 2.5 % OP SOLN: 1.0000 [drp] | Freq: Four times a day (QID) | OPHTHALMIC | Status: DC | PRN

## 2013-09-13 MED ORDER — SODIUM CHLORIDE 0.9 % IJ SOLN: 3.0000 mL | INTRAMUSCULAR | Status: DC | PRN

## 2013-09-13 MED ORDER — POLYVINYL ALCOHOL 1.4 % OP SOLN
1.0000 [drp] | Freq: Four times a day (QID) | OPHTHALMIC | Status: DC | PRN
  Filled 2013-09-13: qty 15

## 2013-09-13 MED ORDER — AMLODIPINE BESYLATE 10 MG PO TABS
10.0000 mg | ORAL_TABLET | Freq: Every day | ORAL | Status: DC
  Administered 2013-09-13 – 2013-09-14 (×2): 10 mg via ORAL
  Filled 2013-09-13 (×2): qty 1

## 2013-09-13 MED ORDER — FUROSEMIDE 40 MG PO TABS
40.0000 mg | ORAL_TABLET | Freq: Two times a day (BID) | ORAL | Status: DC
  Administered 2013-09-13 – 2013-09-14 (×2): 40 mg via ORAL
  Filled 2013-09-13 (×4): qty 1

## 2013-09-13 MED ORDER — SODIUM CHLORIDE 0.9 % IV SOLN: 250.0000 mL | INTRAVENOUS | Status: DC | PRN

## 2013-09-13 MED ORDER — ASPIRIN EC 81 MG PO TBEC
81.0000 mg | DELAYED_RELEASE_TABLET | Freq: Every day | ORAL | Status: DC
  Administered 2013-09-13 – 2013-09-14 (×2): 81 mg via ORAL
  Filled 2013-09-13 (×2): qty 1

## 2013-09-13 MED ORDER — HYDROCOD POLST-CHLORPHEN POLST 10-8 MG/5ML PO LQCR
5.0000 mL | ORAL | Status: AC
  Administered 2013-09-13: 5 mL via ORAL

## 2013-09-13 NOTE — ED Notes (Signed)
Pt returned from xray

## 2013-09-13 NOTE — ED Notes (Signed)
MD at bedside. 

## 2013-09-13 NOTE — ED Notes (Signed)
Pt sleeping in bed

## 2013-09-13 NOTE — ED Notes (Signed)
Paged Dr. Bosie Clos notified patient is still having cp.

## 2013-09-13 NOTE — ED Notes (Signed)
Patient transported to X-ray 

## 2013-09-13 NOTE — ED Notes (Signed)
Pt was seen this past Friday and did not want to the admitted for CHF.  He has returned with continued c/o of chest pain and shortness of breath. Pain 8/10 radiating to left arm and back. Hx of valve replacement and HTN. A&O

## 2013-09-13 NOTE — ED Provider Notes (Signed)
CSN: [id number]     Arrival date & time 09/13/13  9147 History   First MD Initiated Contact with Patient 09/13/13 0750     Chief Complaint  Patient presents with  . Chest Pain  . Shortness of Breath   (Consider location/radiation/quality/duration/timing/severity/associated sxs/prior Treatment) Patient is a 52 y.o. male presenting with chest pain and shortness of breath.  Chest Pain Associated symptoms: cough, nausea, palpitations, shortness of breath and vomiting   Shortness of Breath Associated symptoms: chest pain, cough and vomiting     Patient is a 52 yo male with a history of HTN, CHF, CKD, valve replacement, schizoprhenia who presents with continued chest pain and shortness of breath. Patient was seen 10/23 for the same issue. Was diagnosed with CHF exacerbation given CXR with volume overload and BNP >15000. Was given IV lasix at that time and reported feeling better. The ED physician recommended admission for the patient and he declined and left AMA. Patient presents today with continued left sided chest pain described as sharp and achey in nature now radiating to his left arm. Continues to endorse dyspnea and palpitations. Notes orthopnea and PND. Notes taking medications though has been unable to keep them down related to vomiting episodes. Endorses nausea as well.  Past Medical History  Diagnosis Date  . Hypertension   . Bipolar disorder   . Schizophrenia     h/o hospitalized inpt therapy @ the [facility]  . Valvular heart disease     a. 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 b. 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  Marland Kitchen Hyperlipidemia   . H/O: suicide attempt 1998    was hospitalized at [facility]  . Anemia     Hx of iron infusions  . Gastroparesis   . Arthritis   . Cocaine abuse     per pt none x 3 yrs however continues to "handle" it and deal it with + UDS.  Marland Kitchen Anxiety   . CHF (congestive heart failure)     a. EF 25-30% by echo  12/02/12, but 55% by TEE 12/04/12.  Marland Kitchen GERD (gastroesophageal reflux disease)   . Depression   . Vertigo   . Itchy skin   . Peripheral neuropathy     BLE  . Bilateral lower abdominal cramping   . Shortness of breath     "periodically; happens at any time" (03/30/13)  . Generalized headaches     "monthly" (03/30/2013)  . Chronic kidney disease     has fistula, not on HD; "go to [facility] q 90 days to have kidneys checked out" (03/30/2013)  . Atrial tachycardia     a. dx 11/2012: tx with amiodarone, cardioversion (discharge rhythm: likely junctional).   Past Surgical History  Procedure Laterality Date  . Mitral valve repair  05/2010  . Av fistula placement, radiocephalic Left 05/24/2011    arm; "aien't never had to use it" (03/30/2013)  . Laceration repair Right ~ 1979  . Umbilical hernia repair  04/01/2012    Procedure: HERNIA REPAIR UMBILICAL ADULT;  Surgeon: Ardeth Sportsman, MD;  Location: [facility] OR;  Service: General;  Laterality: N/A;  supra umbilical  . Tee without cardioversion  08/25/2012    Procedure: TRANSESOPHAGEAL ECHOCARDIOGRAM (TEE);  Surgeon: Wendall Stade, MD;  Location: [facility] ENDOSCOPY;  Service: Cardiovascular;  Laterality: N/A;  . Mitral valve replacement  09/23/2012    Procedure: REDO MITRAL VALVE REPLACEMENT (MVR);  Surgeon: [name], MD;  Location: [facility] OR;  Service: Open Heart Surgery;  Laterality: N/A;  Nitric Oxide  . Tricuspid valve replacement  09/23/2012    Procedure: TRICUSPID VALVE REPAIR;  Surgeon: [name], MD;  Location: [facility] OR;  Service: Open Heart Surgery;  Laterality: N/A;  . Tee without cardioversion  12/04/2012    Procedure: TRANSESOPHAGEAL ECHOCARDIOGRAM (TEE);  Surgeon: Laurey Morale, MD;  Location: [facility] ENDOSCOPY;  Service: Cardiovascular;  Laterality: N/A;  CV 1015/vicki, dl  . Cardioversion  1/61/0960    Procedure: CARDIOVERSION;  Surgeon: Laurey Morale, MD;  Location: [facility] ENDOSCOPY;  Service: Cardiovascular;  Laterality: N/A;  . Cardiac valve  replacement      mitral  . Cardiac catheterization  05/26/2010    Hattie Perch 03/30/2013   Family History  Problem Relation Age of Onset  . Hypertension Mother   . Heart disease Mother   . Heart disease Brother    History  Substance Use Topics  . Smoking status: Former Smoker -- 1.50 packs/day for 30 years    Types: Cigarettes    Quit date: 03/21/2011  . Smokeless tobacco: Never Used  . Alcohol Use: No     Comment: Quit alcohol in July of 2011    Review of Systems  Respiratory: Positive for cough and shortness of breath.   Cardiovascular: Positive for chest pain and palpitations. Negative for leg swelling.       Orthopnea and PND positive  Gastrointestinal: Positive for nausea and vomiting. Negative for diarrhea.    Allergies  Cashew nut oil and Food  Home Medications   Current Outpatient Rx  Name  Route  Sig  Dispense  Refill  . acetaminophen (TYLENOL) 325 MG tablet   Oral   Take 650 mg by mouth every 6 (six) hours as needed for pain.         Marland Kitchen albuterol (PROVENTIL HFA;VENTOLIN HFA) 108 (90 BASE) MCG/ACT inhaler   Inhalation   Inhale 2 puffs into the lungs every 6 (six) hours as needed for wheezing.         Marland Kitchen amLODipine (NORVASC) 10 MG tablet   Oral   Take 10 mg by mouth daily.         . ARIPiprazole (ABILIFY) 5 MG tablet   Oral   Take 5 mg by mouth daily.          Marland Kitchen aspirin EC 81 MG tablet   Oral   Take 81 mg by mouth daily.         . carvedilol (COREG) 25 MG tablet   Oral   Take 25 mg by mouth 2 (two) times daily with a meal.         . furosemide (LASIX) 40 MG tablet   Oral   Take 40 mg by mouth 2 (two) times daily.         . hydrALAZINE (APRESOLINE) 100 MG tablet   Oral   Take 100 mg by mouth 2 (two) times daily.         . hydroxypropyl methylcellulose (ISOPTO TEARS) 2.5 % ophthalmic solution   Both Eyes   Place 1 drop into both eyes 4 (four) times daily as needed (dry eyes).         . metoCLOPramide (REGLAN) 10 MG tablet   Oral    Take 10 mg by mouth 4 (four) times daily.         Marland Kitchen omeprazole (PRILOSEC) 40 MG capsule   Oral   Take 40 mg by mouth 2 (two) times daily as  needed (heartburn).         Marland Kitchen OVER THE COUNTER MEDICATION   Oral   Take 30 mLs by mouth 2 (two) times daily as needed (cough).          BP 125/82  Pulse 57  Temp(Src) 97.7 F (36.5 C) (Oral)  Resp 19  Ht 5\' 6"  (1.676 m)  Wt 165 lb (74.844 kg)  BMI 26.64 kg/m2  SpO2 98% Physical Exam  Constitutional: He appears well-developed and well-nourished. No distress.  HENT:  Head: Normocephalic and atraumatic.  Mouth/Throat: Oropharynx is clear and moist.  Eyes: Pupils are equal, round, and reactive to light.  Neck: Neck supple. JVD present.  Cardiovascular:  tachycardic  Pulmonary/Chest: Effort normal and breath sounds normal. No respiratory distress. He has no wheezes. He has no rales.  Abdominal: Soft. He exhibits no distension. There is no tenderness.  Musculoskeletal: He exhibits no edema.  Left upper extremity graft with palpable thrill and good bruit  Neurological: He is alert.  Skin: Skin is warm and dry.    ED Course  Procedures (including critical care time) Labs Review Labs Reviewed  CBC - Abnormal; Notable for the following:    Hemoglobin 11.6 (*)    HCT 35.0 (*)    RDW 21.6 (*)    Platelets 142 (*)    All other components within normal limits  BASIC METABOLIC PANEL - Abnormal; Notable for the following:    BUN 46 (*)    Creatinine, Ser 3.36 (*)    GFR calc non Af Amer 20 (*)    GFR calc Af Amer 23 (*)    All other components within normal limits  PRO B NATRIURETIC PEPTIDE - Abnormal; Notable for the following:    Pro B Natriuretic peptide (BNP) 3457.0 (*)    All other components within normal limits  URINE RAPID DRUG SCREEN (HOSP PERFORMED) - Abnormal; Notable for the following:    Cocaine POSITIVE (*)    Benzodiazepines POSITIVE (*)    All other components within normal limits  HIV ANTIBODY (ROUTINE TESTING)   POCT I-STAT TROPONIN I   Imaging Review Dg Chest 2 View  09/13/2013   CLINICAL DATA:  Congestive heart failure, chest pain and shortness of breath  EXAM: CHEST  2 VIEW  COMPARISON:  09/10/2013  FINDINGS: Mild cardiac enlargement. Replacement cardiac valve identified. There is mild vascular congestion. No pulmonary edema. No pleural effusion.  IMPRESSION: Cardiomegaly with pulmonary venous hypertension but no pulmonary edema.   Electronically Signed   By: Esperanza Heir M.D.   On: 09/13/2013 08:36    Date: 09/13/2013  Rate: 119  Rhythm: sinus tachycardia  QRS Axis: normal  Intervals: PR prolonged  ST/T Wave abnormalities: nonspecific T wave changes  Conduction Disutrbances:first-degree A-V block   Narrative Interpretation: T wave inversions II, V5-V6, inferior q waves  Old EKG Reviewed: changes noted    MDM   1. Chest pain   2. CKD (chronic kidney disease), stage IV   3. Hypertension   4. Vomiting    8:30 am: patient seen and examined. Patient with continued chest pain now radiating down his left arm. Was noted to be volume overloaded on Thursday when seen in the ED. His BNP has improved and he lung exam and extremity exam does not reveal volume overload, though he does have JVD to the jaw line. Notably the patient was cocaine positive in May. Will obtain UDS to evaluate for cocaine at this time. CXR without volume overload. Patient  with new T-wave inversions on EKG. Given new T-wave inversions in II, V5, and V6 will place call to IM TS for admission for continued evaluation of chest pain.  9:12 am: spoke with Dr Bosie Clos of [facility], she will come down to admit the patient for continued evaluation of chest pain. Note patient is cocaine positive and this may be contributing to his chest pain.  This patient was discussed and seen with my attending Dr Radford Pax.  Marikay Alar, MD [facility] PGY-2 09/13/13 11:18 pm   Glori Luis, MD 09/13/13 1620

## 2013-09-13 NOTE — ED Notes (Signed)
Pt providing urine sample in room with urinal

## 2013-09-13 NOTE — H&P (Signed)
Date: 09/13/2013               Patient Name:  Ryan Winters MRN: [id number]  DOB: 1961/07/20 Age / Sex: 52 y.o., male   PCP: Pleas Koch, MD         Medical Service: Internal Medicine Teaching Service         Attending Physician: Dr. Johny Sax    First Contact: Dr. Vernell Morgans Pager: [phone]  Second Contact: Dr. 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 He presented to [facility] ED 10/24 with complaints of cough, shortness of breath and chest pain x 2 days. Found to have BNP >15K, noncompliant with Lasix therapy, V/Q scan w/o evidence of PE but left Against Medical Advice. He presents today with complaints of left sided chest pain with shooting pains into left arm when he coughs. States that 4 days ago he started having a cough which would subside with Mucinex and "cough syrup" but return "once the medicine wore off".  Reports shortness of breath, palpitations, light-headedness and headache with coughing.  He also states that he has been vomiting which has prevented him from taking his medicines today.  Of note, patient had coughing spell during this MD exam with expectoration of yellow-tinged sputum which he describes as vomit. Denies alcohol or cocaine use stating that it has been "years" since he  used.  Meds: Current Facility-Administered Medications  Medication Dose Route Frequency Provider Last Rate Last Dose  . 0.9 %  sodium chloride infusion   Intravenous Continuous Manuela Schwartz, MD      . chlorpheniramine-HYDROcodone (TUSSIONEX) 10-8 MG/5ML suspension 5 mL  5 mL Oral NOW Manuela Schwartz, MD      . furosemide (LASIX) tablet 40 mg  40 mg Oral BID Manuela Schwartz, MD   40 mg at 09/13/13 1145  . oxyCODONE-acetaminophen (PERCOCET/ROXICET) 5-325 MG per tablet 2 tablet  2 tablet Oral Q4H PRN Manuela Schwartz, MD       Current Outpatient Prescriptions  Medication Sig Dispense Refill  . acetaminophen (TYLENOL) 325 MG tablet Take 650 mg by mouth every 6 (six) hours as needed for pain.      Marland Kitchen albuterol (PROVENTIL HFA;VENTOLIN HFA) 108 (90 BASE) MCG/ACT inhaler Inhale 2 puffs into the lungs every 6 (six) hours as needed for wheezing.      Marland Kitchen amLODipine (NORVASC) 10 MG tablet Take 10 mg by mouth daily.      . ARIPiprazole (ABILIFY) 5 MG tablet Take 5 mg by mouth daily.       Marland Kitchen aspirin EC 81 MG tablet Take 81 mg by mouth daily.      . carvedilol (COREG) 25 MG tablet Take 25 mg by mouth 2 (two) times daily with a meal.      . furosemide (LASIX) 40 MG tablet Take 40 mg by mouth 2 (  two) times daily.      . hydrALAZINE (APRESOLINE) 100 MG tablet Take 100 mg by mouth 2 (two) times daily.      . hydroxypropyl methylcellulose (ISOPTO TEARS) 2.5 % ophthalmic solution Place 1 drop into both eyes 4 (four) times daily as needed (dry eyes).      . metoCLOPramide (REGLAN) 10 MG tablet Take 10 mg by mouth 4 (four) times daily.      Marland Kitchen omeprazole (PRILOSEC) 40 MG capsule Take 40 mg by mouth 2 (two) times daily as needed (heartburn).      Marland Kitchen OVER THE COUNTER MEDICATION Take 30 mLs by mouth 2 (two) times daily as needed (cough).        Allergies: Allergies as of 09/13/2013 - Review Complete 09/13/2013  Allergen Reaction Noted  . 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 Food  Anaphylaxis 06/20/2011   Past Medical History  Diagnosis Date  . Hypertension   . Bipolar disorder   . Schizophrenia     h/o hospitalized inpt therapy @ the [facility]  . Valvular heart disease     a. 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 b. 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  Marland Kitchen Hyperlipidemia   . H/O: suicide attempt 1998    was hospitalized at [facility]  . Anemia     Hx of iron infusions  . Gastroparesis   . Arthritis   . Cocaine abuse     per pt none x 3 yrs however continues to "handle" it and deal it with + UDS.  Marland Kitchen Anxiety   . CHF (congestive heart failure)     a. EF 25-30% by echo 12/02/12, but 55% by TEE 12/04/12.  Marland Kitchen GERD (gastroesophageal reflux disease)   . Depression   . Vertigo   . Itchy skin   . Peripheral neuropathy     BLE  . Bilateral lower abdominal cramping   . Shortness of breath     "periodically; happens at any time" (03/30/13)  . Generalized headaches     "monthly" (03/30/2013)  . Chronic kidney disease     has fistula, not on HD; "go to [facility] q 90 days to have kidneys checked out" (03/30/2013)  . Atrial tachycardia     a. dx 11/2012: tx with amiodarone, cardioversion (discharge rhythm: likely junctional).   Past Surgical History  Procedure Laterality Date  . Mitral valve repair  05/2010  . Av fistula placement, radiocephalic Left 05/24/2011    arm; "aien't never had to use it" (03/30/2013)  . Laceration repair Right ~ 1979  . Umbilical hernia repair  04/01/2012    Procedure: HERNIA REPAIR UMBILICAL ADULT;  Surgeon: Ardeth Sportsman, MD;  Location: [facility] OR;  Service: General;  Laterality: N/A;  supra umbilical  . Tee without cardioversion  08/25/2012    Procedure: TRANSESOPHAGEAL ECHOCARDIOGRAM (TEE);  Surgeon: Wendall Stade, MD;  Location: [facility] ENDOSCOPY;  Service: Cardiovascular;  Laterality: N/A;  . Mitral valve replacement  09/23/2012    Procedure: REDO MITRAL VALVE REPLACEMENT (MVR);  Surgeon: [name], MD;  Location: [facility] OR;  Service: Open Heart Surgery;  Laterality: N/A;  Nitric Oxide  . Tricuspid valve replacement  09/23/2012    Procedure: TRICUSPID VALVE REPAIR;  Surgeon: [name], MD;  Location: [facility] OR;  Service: Open Heart Surgery;  Laterality: N/A;  . Tee without cardioversion  12/04/2012    Procedure: TRANSESOPHAGEAL ECHOCARDIOGRAM (TEE);  Surgeon: Laurey Morale, MD;  Location:  [facility] ENDOSCOPY;  Service: Cardiovascular;  Laterality: N/A;  CV 1015/vicki, dl  . Cardioversion  2/95/6213    Procedure: CARDIOVERSION;  Surgeon: Laurey Morale, MD;  Location: [facility] ENDOSCOPY;  Service: Cardiovascular;  Laterality: N/A;  . Cardiac valve replacement      mitral  . Cardiac catheterization  05/26/2010    Hattie Perch 03/30/2013   Family History  Problem Relation Age of Onset  . Hypertension Mother   . Heart disease Mother   . Heart disease Brother    History   Social History  . Marital Status: Married    Spouse Name: N/A    Number of Children: N/A  . Years of Education: N/A   Occupational History  . disabled    Social History Main Topics  . Smoking status: Former Smoker -- 1.50 packs/day for 30 years    Types: Cigarettes    Quit date: 03/21/2011  . Smokeless tobacco: Never Used  . Alcohol Use: No     Comment: Quit alcohol in July of 2011  . Drug Use: No     Comment: None since November "?2012" when had rectal bleeding and went to hospital  . Sexual Activity: Not Currently   Other Topics Concern  . Not on file   Social History Narrative   09/09/2012   He lives with mother and is separated from wife. Daughter in [place] with 2 grandchildren. He denies any cigs, drugs, alcohol at present.     Review of Systems: As per HPI otherwise negative 12 point system  Physical Exam: Blood pressure 132/94, pulse 93, temperature 97.7 F (36.5 C), temperature source Oral, resp. rate 19, height 5\' 6"  (1.676 m), weight 165 lb (74.844 kg), SpO2 98.00%. General: Well-developed,  well-nourished, in mild distress while coughing; Head: Normocephalic, atraumatic. Eyes: PERRLA, EOMI. Throat: Oropharynx nonerythematous, no exudate appreciated.  Neck: supple, no carotid Bruits appreciated, + JVD appreciated. Lungs: Normal respiratory effort. Clear to auscultation bilaterally from apices to bases without crackles or wheezes appreciated. Heart: slightly tachycardic, regular rhythm, normal S1 and S2, +S3, + murmur 2/6 Abdomen: BS normoactive. Soft, Nondistended, non-tender. No masses or organomegaly appreciated. Extremities: trace LE edema, distal pulses intact Neurologic: grossly non-focal, alert and oriented x3, appropriate and cooperative throughout examination.     Lab results: Basic Metabolic Panel:  Recent Labs  08/65/78 0735  NA 140  K 3.8  CL 106  CO2 22  GLUCOSE 87  BUN 46*  CREATININE 3.36*  CALCIUM 8.8   CBC:  Recent Labs  09/13/13 0735  WBC 6.4  HGB 11.6*  HCT 35.0*  MCV 79.4  PLT 142*     Recent Labs Lab 09/13/13 1515  TROPONINI <0.30    BNP:  Recent Labs  09/13/13 0735  PROBNP 3457.0*   Urine Drug Screen: Drugs of Abuse     Component Value Date/Time   LABOPIA NONE DETECTED 09/13/2013 0914   LABOPIA NEGATIVE 05/10/2012 0433   COCAINSCRNUR POSITIVE* 09/13/2013 0914   COCAINSCRNUR POSITIVE* 05/10/2012 0433   LABBENZ POSITIVE* 09/13/2013 0914   LABBENZ NEGATIVE 05/10/2012 0433   AMPHETMU NONE DETECTED 09/13/2013 0914   AMPHETMU NEGATIVE 05/10/2012 0433   THCU NONE DETECTED 09/13/2013 0914   LABBARB NONE DETECTED 09/13/2013 0914      Imaging results:  Dg Chest 2 View  09/13/2013   CLINICAL DATA:  Congestive heart failure, chest pain and shortness of breath  EXAM: CHEST  2 VIEW  COMPARISON:  09/10/2013  FINDINGS: Mild cardiac enlargement. Replacement cardiac valve identified. There  is mild vascular congestion. No pulmonary edema. No pleural effusion.  IMPRESSION: Cardiomegaly with pulmonary venous hypertension but no  pulmonary edema.   Electronically Signed   By: Esperanza Heir M.D.   On: 09/13/2013 08:36    Other results: EKG: nonspecific ST and T waves changes, sinus tachycardia.  Assessment & Plan by Problem: Mr. Chaput is a 52 yo with recent CHF exacerbation and possible URI admitted with chest pain associated with cough and shortness of breath.  1. Cough with Chest pain: multifactorial, likely secondary to viral URI vs acute bronchitis in setting of Congestive Heart Failure exacerbation and cocaine use. His CXR is without infiltrates or pulmonary edema although there may be a component of residual volume overload from CHF given that his BNP was ~15,000 six days ago and is 3400 on this admission which is a little above his lowest BNP this year of 2200-2800.  His presentation is less worrisome for Acute Coronary Syndrome given negative troponins since 09/10/13, in addition to chest pain which started after onset of coughing over 2 day ago. Pulmonary embolism very low probability given recent V/Q results.  Pt doesn't describe a pleuritic character to his chest pain such as pain on inspiration and his EKG is without findings to suggest pericarditis. He doesn't describe tearing chest pain and CXR with indication of aortic dissection or esophageal rupture (no air in mediastinum, left hydrothorax, no widened mediastinum or increased aotic prominence). He denies reflux type symptoms and reports compliance with PPI and metoclopramide therapy. He has significant history for valvular dysfunction s/p repair/replacement and known atrial tachycardia with variable block.  We will monitor with telemetry and resume his home regimen of calcium channel blocker, beta-blocker, and hydralazine. Per Epic records he stopped Imdur secondary to headaches. Given his creatinine level of 3.6 we will defer IV lasix therapy for now. -amlodipine 10 mg qd -carvedilol 25 mg bid -Hydralazine 100 mg bid -EKG in am -cycle troponins -tussionex  and tessalon perles for cough -saline lock -check HIV  Recent Labs  09/13/13 0747  TROPIPOC 0.02    Recent Labs Lab 09/13/13 1515  TROPONINI <0.30   2. Acute on chronic congestive heart failure: resolving exacerbation, secondary to poor Lasix compliance, current weight 157 lbs which is down 8 lbs from ED visit 09/10/13 but on Epic review he has had weights down to 140s earlier this year. -defer IV lasix for now given creatinine level -continue carvedilol home regimen of antihypertensives -consider repeat ECHO as last was ~6 months ago and pt with significant valve history -check daily weights, 1.2L fluid restriction -will need f/u with Cardiology, last seen by Dr. Excell Seltzer May 2014  3. Hypertension: above goal on admission as pt has not had his medications today -resume home antihypertensive meds  4. Chronic Kidney Disease Stage 4: stable at baseline  Recent Labs Lab 09/10/13 1005 09/13/13 0735 09/13/13 1515  CREATININE 3.62* 3.36* 3.14*   5. Schizophrenia: stable, only on Abilify  6. GERD: stable on omeprazole -cont PPI  Dispo: Disposition is deferred at this time, awaiting improvement of current medical problems. Anticipated discharge in approximately 1-2 day(s).   The patient does have a current PCP Pleas Koch, MD) and does need an [facility] hospital follow-up appointment after discharge.  The patient does not have transportation limitations that hinder transportation to clinic appointments.  Signed: Manuela Schwartz, MD 09/13/2013, 12:12 PM

## 2013-09-14 DIAGNOSIS — F209 Schizophrenia, unspecified: Secondary | ICD-10-CM

## 2013-09-14 DIAGNOSIS — Z9889 Other specified postprocedural states: Secondary | ICD-10-CM

## 2013-09-14 DIAGNOSIS — K3184 Gastroparesis: Secondary | ICD-10-CM

## 2013-09-14 LAB — BASIC METABOLIC PANEL
BUN: 48 mg/dL — ABNORMAL HIGH (ref 6–23)
Calcium: 8.6 mg/dL (ref 8.4–10.5)
Creatinine, Ser: 3.38 mg/dL — ABNORMAL HIGH (ref 0.50–1.35)
GFR calc Af Amer: 23 mL/min — ABNORMAL LOW (ref >90)
GFR calc non Af Amer: 19 mL/min — ABNORMAL LOW (ref >90)
Potassium: 3.6 mEq/L (ref 3.5–5.1)

## 2013-09-14 LAB — CBC
MCH: 26.2 pg (ref 26.0–34.0)
MCHC: 32.8 g/dL (ref 30.0–36.0)
Platelets: 115 10*3/uL — ABNORMAL LOW (ref 150–400)
RDW: 21.6 % — ABNORMAL HIGH (ref 11.5–15.5)

## 2013-09-14 LAB — TROPONIN I: Troponin I: <0.3 ng/mL (ref <0.30)

## 2013-09-14 MED ORDER — BENZONATATE 100 MG PO CAPS: 100.0000 mg | ORAL_CAPSULE | Freq: Three times a day (TID) | ORAL | Status: DC

## 2013-09-14 MED ORDER — HYDROCOD POLST-CHLORPHEN POLST 10-8 MG/5ML PO LQCR: 5.0000 mL | Freq: Two times a day (BID) | ORAL | Status: DC

## 2013-09-14 MED ORDER — BENZONATATE 100 MG PO CAPS
100.0000 mg | ORAL_CAPSULE | Freq: Once | ORAL | Status: AC
  Administered 2013-09-14: 06:00:00 100 mg via ORAL
  Filled 2013-09-14: qty 1

## 2013-09-14 MED ORDER — METOCLOPRAMIDE HCL 10 MG PO TABS: 10.0000 mg | ORAL_TABLET | Freq: Four times a day (QID) | ORAL | Status: DC

## 2013-09-14 NOTE — H&P (Signed)
Pt seen and discussed with Dr Bosie Clos, agree with her excellent note.

## 2013-09-14 NOTE — Discharge Summary (Signed)
Name: Ryan Winters MRN: [id number] DOB: March 11, 1961 52 y.o. PCP: Pleas Koch, MD  Date of Admission: 09/13/2013  7:42 AM Date of Discharge: 09/14/2013 Attending Physician: Ginnie Smart, MD  Discharge Diagnosis:  Principal Problem:   URI, acute with chest discomfort Active Problems:   BIPOLAR AFFECTIVE DISORDER   Hypertension   CKD (chronic kidney disease), stage IV   GERD (gastroesophageal reflux disease)   Gastroparesis   T wave inversion in EKG   Schizophrenia   Chronic combined systolic and diastolic CHF (congestive heart failure)   Cocaine abuse  Discharge Medications:   Medication List    ASK your doctor about these medications       ABILIFY 5 MG tablet  Generic drug:  ARIPiprazole  Take 5 mg by mouth daily.     acetaminophen 325 MG tablet  Commonly known as:  TYLENOL  Take 650 mg by mouth every 6 (six) hours as needed for pain.     albuterol 108 (90 BASE) MCG/ACT inhaler  Commonly known as:  PROVENTIL HFA;VENTOLIN HFA  Inhale 2 puffs into the lungs every 6 (six) hours as needed for wheezing.     amLODipine 10 MG tablet  Commonly known as:  NORVASC  Take 10 mg by mouth daily.     aspirin EC 81 MG tablet  Take 81 mg by mouth daily.     carvedilol 25 MG tablet  Commonly known as:  COREG  Take 25 mg by mouth 2 (two) times daily with a meal.     furosemide 40 MG tablet  Commonly known as:  LASIX  Take 40 mg by mouth 2 (two) times daily.     hydrALAZINE 100 MG tablet  Commonly known as:  APRESOLINE  Take 100 mg by mouth 2 (two) times daily.     hydroxypropyl methylcellulose 2.5 % ophthalmic solution  Commonly known as:  ISOPTO TEARS  Place 1 drop into both eyes 4 (four) times daily as needed (dry eyes).     metoCLOPramide 10 MG tablet  Commonly known as:  REGLAN  Take 10 mg by mouth 4 (four) times daily.     omeprazole 40 MG capsule  Commonly known as:  PRILOSEC  Take 40 mg by mouth 2 (two) times daily as needed (heartburn).     OVER THE COUNTER MEDICATION  Take 30 mLs by mouth 2 (two) times daily as needed (cough).        Disposition and follow-up:   Ryan Winters was discharged from [facility] in Stable condition.  At the hospital follow up visit please address:  1.  Resolution of cough and chest pain  2.  Labs / imaging needed at time of follow-up: None  3.  Pending labs/ test needing follow-up: None  Follow-up Appointments:   Discharge Instructions:  Future Appointments Provider Department Dept Phone   09/17/2013 1:45 PM Dow Adolph, MD [facility] [phone]   10/14/2013 12:00 PM Tonny Bollman, MD [facility] [place] Office [phone]      Consultations: None    Procedures Performed:  Dg Chest 2 View  09/13/2013   CLINICAL DATA:  Congestive heart failure, chest pain and shortness of breath  EXAM: CHEST  2 VIEW  COMPARISON:  09/10/2013  FINDINGS: Mild cardiac enlargement. Replacement cardiac valve identified. There is mild vascular congestion. No pulmonary edema. No pleural effusion.  IMPRESSION: Cardiomegaly with pulmonary venous hypertension but no pulmonary edema.   Electronically Signed   By: Marcy Salvo  Rubner M.D.   On: 09/13/2013 08:36   Dg Chest 2 View  09/10/2013   CLINICAL DATA:  Cough and shortness of breath the; chest pain  CHEST  2 VIEW  COMPARISON:  Mar 30, 2013  FINDINGS: There is no edema or consolidation. Heart is mildly enlarged with mild pulmonary venous hypertension. There are mitral and tricuspid valve replacements. No adenopathy. No bone lesions.  IMPRESSION: Cardiomegaly with pulmonary venous hypertension. Suspect mild volume overload. No edema or consolidation, however. No effusions.   Electronically Signed   By: Bretta Bang M.D.   On: 09/10/2013 09:56   Nm Pulmonary Perf And Vent  09/10/2013   CLINICAL DATA:  52 year old male with chest pain shortness of breath and productive cough.  EXAM: NUCLEAR MEDICINE  VENTILATION - PERFUSION LUNG SCAN  TECHNIQUE: Ventilation images were obtained in multiple projections using inhaled aerosol technetium 99 M DTPA. Perfusion images were obtained in multiple projections after intravenous injection of Tc-58m MAA.  COMPARISON:  Chest radiographs 0947 hr the same day.  RADIOPHARMACEUTICALS:  40 mCi Tc-13m DTPA aerosol and 6 mCi Tc-79m MAA  FINDINGS: comparison radiographs demonstrate cardiomegaly.  Ventilation: Posterior ventilation images demonstrate no evidence of ventilation defect. Suboptimal ventilation radiotracer activity on other obliquities.  Perfusion: Homogeneous radiotracer activity with no perfusion defect.  IMPRESSION: Very low probability of acute pulmonary embolus.   Electronically Signed   By: Augusto Gamble M.D.   On: 09/10/2013 15:14   Dg Foot Complete Right  09/07/2013   X-ray examination 3 views right foot  Intact bony structures without any fractures or dislocations noted. Varus  rotation noted on the fifth digit. Bone quality appears unremarkable.  Radiographic impression: No acute bony abnormality noted.   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 He presented to [facility] ED 10/24 with complaints of cough, shortness of breath and chest pain x 2 days. Found to have BNP >15K, noncompliant with Lasix therapy, V/Q scan w/o evidence of PE but left Against Medical Advice. He presents today with complaints of left sided chest pain with shooting pains into left arm when he coughs.  States that 4 days ago he started having a cough which would subside with Mucinex and "cough syrup" but return "once the medicine wore off". Reports shortness of breath, palpitations, light-headedness and headache with coughing. He also states that he has been vomiting which has prevented him from taking his medicines today. Of note, patient had coughing spell during this MD exam with expectoration of yellow-tinged sputum which he describes as vomit. Denies alcohol or cocaine use stating that it has been "years" since he used.  Physical Exam:  Blood pressure 132/94, pulse 93, temperature 97.7 F (36.5 C), temperature source Oral, resp. rate 19, height 5\' 6"  (1.676 m), weight 165 lb (74.844 kg), SpO2 98.00%. General: Well-developed, well-nourished, in mild distress while coughing; Head: Normocephalic, atraumatic. Eyes: PERRLA, EOMI. Throat: Oropharynx nonerythematous, no exudate appreciated.  Neck: supple, no carotid Bruits appreciated, + JVD appreciated. Lungs: Normal respiratory effort. Clear to auscultation bilaterally from apices to bases without crackles or wheezes appreciated. Heart: slightly  tachycardic, regular rhythm, normal S1 and S2, +S3, + murmur 2/6 Abdomen: BS normoactive. Soft, Nondistended, non-tender. No masses or organomegaly appreciated. Extremities: trace LE edema, distal pulses intact Neurologic: grossly non-focal, alert and oriented x3, appropriate and cooperative throughout examination.   Hospital Course by problem list: Principal Problem:   URI, acute with chest pain Active Problems:   BIPOLAR AFFECTIVE DISORDER   Hypertension   CKD (chronic kidney disease), stage IV   GERD (gastroesophageal reflux disease)   Gastroparesis   T wave inversion in EKG   Schizophrenia   Chronic combined systolic and diastolic CHF (congestive heart failure)   Cocaine abuse   Ryan Winters is a 52 yo with recent CHF exacerbation and possible URI    1. Upper respiratory tract infection  with Cough and Chest pain: Ryan Winters was admitted with clinical picture consistent with acute URI in setting of resolving CHF exacerbation and paroxysmal atrial tachycardia. His EKG demonstrated deeper inverted T-waves in leads V4-V6. He has significant cardiac history including mitral valve replacement, tricuspid valve repair, cardiomyopathy with EF 50%, recent CHF exacerbation with pro-BNP  >15,000  in addition to cocaine use /exposure he was admitted for observation on telemetry. Of note, patient denies using cocaine although UDS consistently positive. He was continued on his home regimen of amlodipine 10 mg qd, carvedilol 25 mg bid and hydralazine 100 mg bid and received Tussionex syrup and Tessalon Perles for his cough as well as Percocet for his chest pain. His troponin levels remained negative since initial presentation 09/10/2013. Repeat EKG was unchanged from prior tracing and HIV was negative. By day of discharge Ryan Winters reported a large improvement in his cough and denied chest pain. He was discharged home with Tussionex, Jerilynn Som, and Tylenol with follow-up in the Internal medicine Clinic 3 days later on 09/17/2013 with instructions to call MD or present to ED if symptoms worsened including increased shortness of breath and chest pain.    Recent Labs Lab 09/13/13 1515 09/13/13 1820 09/14/13 0048  TROPONINI <0.30 <0.30 <0.30    2. Acute on chronic congestive heart failure: Ryan Winters was admitted with resolving exacerbation secondary to poor Lasix compliance.  He stated that he missed several doses secondary to coughing which made it difficult to take and  keep the pills down.  His admission weight was 157 lbs which is down 8 lbs from ED visit 09/10/13. Per Epic review he has had weights down to 140s earlier this year. He was not further diuresed with IV Lasix given his elevated creatinine which was on the higher end of his baseline Creatinine of ~3.3. His last ECHO was May 2014.  Will defer need for follow-up ECHO to his cardiologist.  He is scheduled to see Dr. Excell Seltzer 10/14/2013.  3. Hypertension: His blood pressure was above goal on admission.  He was resumed on his home regimen as noted above. By day of discharge, his blood pressure was 118/88.  4. Chronic Kidney Disease Stage 4: Creatinine mas mildly elevated above his base line but had trended downward slightly by discharge.  He has a left AV fistula in place that has never been assessed.  He foolows with Dr[facility] Abel [facility]o of [facility].   Recent Labs Lab 09/10/13 1005 09/13/13 0735 09/13/13 1515 09/14/13 0048  CREATININE 3.62* 3.36* 3.14* 3.38*    5. Schizophrenia: Remained cooperative and appropriate during his hospitalization. Only psych medication is Abilify.  He is followed at [facility].  6. GERD: Remained stable on proton inhibitor  therapy.     Discharge Vitals:   BP 121/92  Pulse 121  Temp(Src) 97.9 F (36.6 C) (Oral)  Resp 16  Ht 5\' 7"  (1.702 m)  Wt 158 lb 11.7 oz (72 kg)  BMI 24.85 kg/m2  SpO2 98%  Discharge Labs:  Results for orders placed during the hospital encounter of 09/13/13 (from the past 24 hour(s))  URINE RAPID DRUG SCREEN (HOSP PERFORMED)     Status: Abnormal   Collection Time    09/13/13  9:14 AM      Result Value Range   Opiates NONE DETECTED  NONE DETECTED   Cocaine POSITIVE (*) NONE DETECTED   Benzodiazepines POSITIVE (*) NONE DETECTED   Amphetamines NONE DETECTED  NONE DETECTED   Tetrahydrocannabinol NONE DETECTED  NONE DETECTED   Barbiturates NONE DETECTED  NONE DETECTED  HIV ANTIBODY (ROUTINE TESTING)     Status: None   Collection Time    09/13/13 10:58 AM      Result Value Range   HIV NON REACTIVE  NON REACTIVE  TROPONIN I     Status: None   Collection Time    09/13/13  3:15 PM      Result Value Range   Troponin I <0.30  <0.30 ng/mL  CBC     Status: Abnormal   Collection Time    09/13/13  3:15 PM      Result Value Range   WBC 5.5  4.0 - 10.5  K/uL   RBC 4.12 (*) 4.22 - 5.81 MIL/uL   Hemoglobin 10.9 (*) 13.0 - 17.0 g/dL   HCT 09.8 (*) 11.9 - 14.7 %   MCV 79.1  78.0 - 100.0 fL   MCH 26.5  26.0 - 34.0 pg   MCHC 33.4  30.0 - 36.0 g/dL   RDW 82.9 (*) 56.2 - 13.0 %   Platelets 120 (*) 150 - 400 K/uL  CREATININE, SERUM     Status: Abnormal   Collection Time    09/13/13  3:15 PM      Result Value Range   Creatinine, Ser 3.14 (*) 0.50 - 1.35 mg/dL   GFR calc non Af Amer 21 (*) >90 mL/min   GFR calc Af Amer 25 (*) >90 mL/min  TROPONIN I     Status: None   Collection Time    09/13/13  6:20 PM      Result Value Range   Troponin I <0.30  <0.30 ng/mL  TROPONIN I     Status: None   Collection Time    09/14/13 12:48 AM      Result Value Range   Troponin I <0.30  <0.30 ng/mL  BASIC METABOLIC PANEL     Status: Abnormal   Collection Time    09/14/13 12:48 AM      Result Value Range   Sodium 139  135 - 145 mEq/L   Potassium 3.6  3.5 - 5.1 mEq/L   Chloride 105  96 - 112 mEq/L   CO2 22  19 - 32 mEq/L   Glucose, Bld 106 (*) 70 - 99 mg/dL   BUN 48 (*) 6 - 23 mg/dL   Creatinine, Ser 8.65 (*) 0.50 - 1.35 mg/dL   Calcium 8.6  8.4 - 78.4 mg/dL   GFR calc non Af Amer 19 (*) >90 mL/min   GFR calc Af Amer 23 (*) >90 mL/min  CBC     Status: Abnormal   Collection Time    09/14/13 12:48 AM  Result Value Range   WBC 6.4  4.0 - 10.5 K/uL   RBC 4.39  4.22 - 5.81 MIL/uL   Hemoglobin 11.5 (*) 13.0 - 17.0 g/dL   HCT 40.9 (*) 81.1 - 91.4 %   MCV 80.0  78.0 - 100.0 fL   MCH 26.2  26.0 - 34.0 pg   MCHC 32.8  30.0 - 36.0 g/dL   RDW 78.2 (*) 95.6 - 21.3 %   Platelets 115 (*) 150 - 400 K/uL    Signed: Manuela Schwartz, MD 09/14/2013, 8:56 AM   Time Spent on Discharge: >35 minutes Services Ordered on Discharge: None Equipment Ordered on Discharge: None

## 2013-09-14 NOTE — H&P (Signed)
  Date: 09/14/2013  Patient name: Ryan Winters  Medical record number: [id number]  Date of birth: Apr 13, 1961   I have seen and evaluated Lowell Bouton and discussed their care with the Residency Team.   Assessment and Plan: I have seen and evaluated the patient as outlined above. I agree with the formulated Assessment and Plan as detailed in the residents' admission note, with the following changes:   52 yo M with hx of cocaine abuse, MVR/tricuspid repair (09-2012), CRF (stage IV), comes to ED on 10-24 with worsing SOB and cough after being off his lasix. He had V/Q scan as part of his w/u (-).  He was diuresed and improved, returned home (AMA).  He returned on 10-26 with continued cough, CP, which had transiently improved at home with cough rx. He had vomiting that was associated with coughing.   SOCHx: lives with mom, separated.  He stated that he had been ETOH/cocaine free for years.  Filed Vitals:   09/14/13 0530  BP: 121/92  Pulse: 121  Temp: 97.9 F (36.6 C)  Resp: 16  Eyes: EOMI, PERRL Mouth: dry Neck: nontender, no lan Chest: cta CV: RRR Abd: BS+, soft, nontender Extr: no edema. LUE AVG + thrill.   Labs of note: ECG: T down V4-6 WBC 5.5 Troponin (-) x3 HIV (-) BNP 3,457 (15,586 previously)  A/P Chest Pain, Cough CHF, acute on chronic (50-55% May 2014) CKD Cocaine use  He is doing well, up in room. He is CP free. He has ruled out.  Will help him get his meds as outpt.  He will f/u in IM clinic, with his nephrologist   Ginnie Smart, MD 10/27/20148:43 AM

## 2013-09-14 NOTE — Progress Notes (Signed)
Went over all discharge info with pt including home meds and follow up appts.

## 2013-09-14 NOTE — Progress Notes (Signed)
Utilization Review Completed Pearley Millington J. Inella Kuwahara, RN, BSN, NCM [phone]  

## 2013-09-14 NOTE — Progress Notes (Signed)
Pt discharged by self refused wheelchair (had already called a cab) with all belongings and d/c instructions. Pt had asked for help obtaining home meds, consulted with case management and they said that since pt is medicaid that all meds are $ 3.00 dollar copay and no other assistance is available.

## 2013-09-14 NOTE — Progress Notes (Signed)
Subjective:  Pt is up this morning walking around the room in no distress. He denies any SOB or CP. He reports improvement in his cough. He is ready to be discharged today.    Objective: Vital signs in last 24 hours: Filed Vitals:   09/13/13 2048 09/14/13 0204 09/14/13 0530 09/14/13 0909  BP: 113/76 111/73 121/92 118/88  Pulse: 122 121 121   Temp: 97.6 F (36.4 C) 97.8 F (36.6 C) 97.9 F (36.6 C)   TempSrc: Oral Oral Oral   Resp: 20 20 16    Height:      Weight:   72 kg (158 lb 11.7 oz)   SpO2: 99% 99% 98%    Weight change:   Intake/Output Summary (Last 24 hours) at 09/14/13 0915 Last data filed at 09/14/13 0830  Gross per 24 hour  Intake    960 ml  Output      0 ml  Net    960 ml   Constitutional: Vital signs reviewed.  Patient is a well-developed and well-nourished male in no acute distress and cooperative with exam.  Head: Normocephalic and atraumatic Eyes: PERRL, EOMI, conjunctivae normal, No scleral icterus.  Neck: Supple, Trachea midline .  Cardiovascular: RRR, S1 normal, S2 normal, no MRG, pulses symmetric and intact bilaterally Pulmonary/Chest: normal respiratory effort, CTAB, no wheezes, rales, or rhonchi Abdominal: Soft. Non-tender, non-distended, bowel sounds are normal, no masses, organomegaly, or guarding present.  Musculoskeletal: No joint deformities, erythema, or stiffness noted. Neurological: A&O x3, cranial nerve II-XII are grossly intact, no focal motor deficit, sensory intact to light touch bilaterally.  Skin: Warm, dry and intact. No rash, cyanosis, or clubbing.  Psychiatric: Normal mood and affect.   Lab Results: Basic Metabolic Panel:  Recent Labs Lab 09/13/13 0735 09/13/13 1515 09/14/13 0048  NA 140  --  139  K 3.8  --  3.6  CL 106  --  105  CO2 22  --  22  GLUCOSE 87  --  106*  BUN 46*  --  48*  CREATININE 3.36* 3.14* 3.38*  CALCIUM 8.8  --  8.6   Liver Function Tests:  Recent Labs Lab 09/10/13 1005  AST 27  ALT 25    ALKPHOS 148*  BILITOT 1.1  PROT 6.8  ALBUMIN 3.4*   No results found for this basename: LIPASE, AMYLASE,  in the last 168 hours No results found for this basename: AMMONIA,  in the last 168 hours CBC:  Recent Labs Lab 09/10/13 1005  09/13/13 1515 09/14/13 0048  WBC 6.7  < > 5.5 6.4  NEUTROABS 4.2  --   --   --   HGB 11.7*  < > 10.9* 11.5*  HCT 34.7*  < > 32.6* 35.1*  MCV 78.2  < > 79.1 80.0  PLT 107*  < > 120* 115*  < > = values in this interval not displayed. Cardiac Enzymes:  Recent Labs Lab 09/13/13 1515 09/13/13 1820 09/14/13 0048  TROPONINI <0.30 <0.30 <0.30   BNP:  Recent Labs Lab 09/10/13 1005 09/13/13 0735  PROBNP 15586.0* 3457.0*   D-Dimer:  Recent Labs Lab 09/10/13 1005  DDIMER 1.04*   CBG: No results found for this basename: GLUCAP,  in the last 168 hours Hemoglobin A1C: No results found for this basename: HGBA1C,  in the last 168 hours Fasting Lipid Panel: No results found for this basename: CHOL, HDL, LDLCALC, TRIG, CHOLHDL, LDLDIRECT,  in the last 168 hours Thyroid Function Tests: No results found for this basename:  TSH, T4TOTAL, FREET4, T3FREE, THYROIDAB,  in the last 168 hours Coagulation:  Recent Labs Lab 09/10/13 1005  LABPROT 16.3*  INR 1.34   Anemia Panel: No results found for this basename: VITAMINB12, FOLATE, FERRITIN, TIBC, IRON, RETICCTPCT,  in the last 168 hours Urine Drug Screen: Drugs of Abuse     Component Value Date/Time   LABOPIA NONE DETECTED 09/13/2013 0914   LABOPIA NEGATIVE 05/10/2012 0433   COCAINSCRNUR POSITIVE* 09/13/2013 0914   COCAINSCRNUR POSITIVE* 05/10/2012 0433   LABBENZ POSITIVE* 09/13/2013 0914   LABBENZ NEGATIVE 05/10/2012 0433   AMPHETMU NONE DETECTED 09/13/2013 0914   AMPHETMU NEGATIVE 05/10/2012 0433   THCU NONE DETECTED 09/13/2013 0914   LABBARB NONE DETECTED 09/13/2013 0914    Alcohol Level: No results found for this basename: ETH,  in the last 168 hours Urinalysis: No results found for  this basename: COLORURINE, APPERANCEUR, LABSPEC, PHURINE, GLUCOSEU, HGBUR, BILIRUBINUR, KETONESUR, PROTEINUR, UROBILINOGEN, NITRITE, LEUKOCYTESUR,  in the last 168 hours Misc. Labs:   Micro Results: Recent Results (from the past 240 hour(s))  CULTURE, BLOOD (ROUTINE X 2)     Status: None   Collection Time    09/10/13 10:10 AM      Result Value Range Status   Specimen Description BLOOD RIGHT ARM   Final   Special Requests     Final   Value: BOTTLES DRAWN AEROBIC AND ANAEROBIC BLUE 10CC RED 5CC   Culture  Setup Time     Final   Value: 09/10/2013 15:36     Performed at [facility]   Culture     Final   Value:        BLOOD CULTURE RECEIVED NO GROWTH TO DATE CULTURE WILL BE HELD FOR 5 DAYS BEFORE ISSUING A FINAL NEGATIVE REPORT     Performed at [facility]   Report Status PENDING   Incomplete  CULTURE, BLOOD (ROUTINE X 2)     Status: None   Collection Time    09/10/13 10:18 AM      Result Value Range Status   Specimen Description BLOOD HAND RIGHT   Final   Special Requests BOTTLES DRAWN AEROBIC ONLY 10CC   Final   Culture  Setup Time     Final   Value: 09/10/2013 15:36     Performed at [facility]   Culture     Final   Value:        BLOOD CULTURE RECEIVED NO GROWTH TO DATE CULTURE WILL BE HELD FOR 5 DAYS BEFORE ISSUING A FINAL NEGATIVE REPORT     Performed at [facility]   Report Status PENDING   Incomplete   Studies/Results: Dg Chest 2 View  09/13/2013   CLINICAL DATA:  Congestive heart failure, chest pain and shortness of breath  EXAM: CHEST  2 VIEW  COMPARISON:  09/10/2013  FINDINGS: Mild cardiac enlargement. Replacement cardiac valve identified. There is mild vascular congestion. No pulmonary edema. No pleural effusion.  IMPRESSION: Cardiomegaly with pulmonary venous hypertension but no pulmonary edema.   Electronically Signed   By: Esperanza Heir M.D.   On: 09/13/2013 08:36   Medications: I have reviewed the patient's current  medications. Scheduled Meds: . amLODipine  10 mg Oral Daily  . ARIPiprazole  5 mg Oral Daily  . aspirin EC  81 mg Oral Daily  . benzonatate  100 mg Oral TID  . carvedilol  25 mg Oral BID WC  . chlorpheniramine-HYDROcodone  5 mL Oral Q12H  . enoxaparin (  LOVENOX) injection  30 mg Subcutaneous Q24H  . furosemide  40 mg Oral BID  . hydrALAZINE  100 mg Oral BID  . metoCLOPramide  5 mg Oral TID AC & HS  . pantoprazole  40 mg Oral Daily  . sodium chloride  3 mL Intravenous Q12H  . sodium chloride  3 mL Intravenous Q12H   Continuous Infusions:  PRN Meds:.sodium chloride, acetaminophen, albuterol, oxyCODONE-acetaminophen, polyvinyl alcohol, sodium chloride Assessment/Plan: Principal Problem:   URI, acute Active Problems:   BIPOLAR AFFECTIVE DISORDER   Hypertension   CKD (chronic kidney disease), stage IV   GERD (gastroesophageal reflux disease)   Gastroparesis   T wave inversion in EKG   Schizophrenia   Chronic combined systolic and diastolic CHF (congestive heart failure)   Cocaine abuse  1. Cough with Chest pain: multifactorial, likely secondary to viral URI vs acute bronchitis in setting of Congestive Heart Failure exacerbation and cocaine use. His CXR is without infiltrates or pulmonary edema although there may be a component of residual volume overload from CHF given that his BNP was ~15,000 six days ago and is 3400 on this admission which is a little above his lowest BNP this year of 2200-2800. His presentation is less worrisome for Acute Coronary Syndrome given negative troponins since 09/10/13, in addition to chest pain which started after onset of coughing over 2 day ago. Pulmonary embolism very low probability given recent V/Q results. Pt doesn't describe a pleuritic character to his chest pain such as pain on inspiration and his EKG is without findings to suggest pericarditis. He doesn't describe tearing chest pain and CXR with indication of aortic dissection or esophageal rupture  (no air in mediastinum, left hydrothorax, no widened mediastinum or increased aotic prominence). He denies reflux type symptoms and reports compliance with PPI and metoclopramide therapy. He has significant history for valvular dysfunction s/p repair/replacement and known atrial tachycardia with variable block. We will monitor with telemetry and resume his home regimen of calcium channel blocker, beta-blocker, and hydralazine. Per Epic records he stopped Imdur secondary to headaches. Given his creatinine level of 3.6 we will defer IV lasix therapy for now.  -amlodipine 10 mg qd  -carvedilol 25 mg bid  -Hydralazine 100 mg bid  -EKG in am  -cycle troponins  -tussionex and tessalon perles for cough  -saline lock  -check HIV   Recent Labs   09/13/13 0747   TROPIPOC  0.02    Recent Labs  Lab  09/13/13 1515   TROPONINI  <0.30    09/14/13: Pt reports no CP or SOB this am and improvement in his cough. Troponins have been negative. EKG this morning is consistent with prior EKGs. HIV negative. He is ready to be discharged today.   2. Acute on chronic congestive heart failure: resolving exacerbation, secondary to poor Lasix compliance, current weight 157 lbs which is down 8 lbs from ED visit 09/10/13 but on Epic review he has had weights down to 140s earlier this year.  -defer IV lasix for now given creatinine level  -continue carvedilol home regimen of antihypertensives  -consider repeat ECHO as last was ~6 months ago and pt with significant valve history  -check daily weights, 1.2L fluid restriction  -will need f/u with Cardiology, last seen by Dr. Excell Seltzer May 2014  3. Hypertension: above goal on admission as pt has not had his medications today  -resumed home antihypertensive meds  4. Chronic Kidney Disease Stage 4: stable at baseline.   Recent Labs  Lab  09/10/13 1005  09/13/13 0735  09/13/13 1515   CREATININE  3.62*  3.36*  3.14*    5. Schizophrenia: stable, only on Abilify  6.  GERD: stable on omeprazole  -continuted PPI  Dispo: Anticipated discharge today.   The patient does have a current PCP Pleas Koch, MD) and does need an [facility] hospital follow-up appointment after discharge.  .Services Needed at time of discharge: Y = Yes, Blank = No PT:   OT:   RN:   Equipment:   Other:     LOS: 1 day   Boykin Peek, MD 09/14/2013, 9:15 AM

## 2013-09-16 LAB — CULTURE, BLOOD (ROUTINE X 2): Culture: NO GROWTH

## 2013-09-17 ENCOUNTER — Ambulatory Visit (INDEPENDENT_AMBULATORY_CARE_PROVIDER_SITE_OTHER): Payer: Medicaid Other | Admitting: Internal Medicine

## 2013-09-17 VITALS — BP 111/69 | HR 70 | Temp 97.1°F | Wt 164.3 lb

## 2013-09-17 DIAGNOSIS — J069 Acute upper respiratory infection, unspecified: Secondary | ICD-10-CM

## 2013-09-17 DIAGNOSIS — I509 Heart failure, unspecified: Secondary | ICD-10-CM

## 2013-09-17 DIAGNOSIS — I5042 Chronic combined systolic (congestive) and diastolic (congestive) heart failure: Secondary | ICD-10-CM

## 2013-09-17 NOTE — Progress Notes (Signed)
Patient ID: Ryan Winters, male   DOB: 18-Feb-1961, 52 y.o.   MRN: [id number]   Subjective:   HPI: [name] is a 52 y.o. gentleman with past medical history of schizophrenia, bipolar disorder, hypertension, HIV infection among other health problems, presents to the clinic for hospital followup visit.   Patient was recently hospitalized btn 10/26-10/27/2014 for a URI versus CHF exacerbation. He was unable to fill his prescription of Tussionex due to cost. He reports that his been using Robitussin and his "chest congestion" has significantly improved. No shortness of breath, fevers, cough, chest pain, or any other cardiopulmonary symptoms at the moment.  No new complaints today. He reports that he is compliant with his medications including heart failure regimen. He has an upcoming cardiologist appointment with Dr. Excell Winters on 10/14/2013.    Past Medical History  Diagnosis Date  . Hypertension   . Bipolar disorder   . Schizophrenia     h/o hospitalized inpt therapy @ the [facility]  . Valvular heart disease     a. 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 b. 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  Marland Kitchen Hyperlipidemia   . H/O: suicide attempt 1998    was hospitalized at [facility]  . Anemia     Hx of iron infusions  . Gastroparesis   . Arthritis   . Cocaine abuse     per pt none x 3 yrs however continues to "handle" it and deal it with + UDS.  Marland Kitchen Anxiety   . CHF (congestive heart failure)     a. EF 25-30% by echo 12/02/12, but 55% by TEE 12/04/12.  Marland Kitchen GERD (gastroesophageal reflux disease)   . Depression   . Vertigo   . Itchy skin   . Peripheral neuropathy     BLE  . Bilateral lower abdominal cramping   . Shortness of breath     "periodically; happens at any time" (03/30/13)  . Generalized headaches     "monthly" (03/30/2013)  . Chronic kidney disease     has fistula, not on HD; "go to [facility] q 90 days to have kidneys checked out"  (03/30/2013)  . Atrial tachycardia     a. dx 11/2012: tx with amiodarone, cardioversion (discharge rhythm: likely junctional).   Current Outpatient Prescriptions  Medication Sig Dispense Refill  . acetaminophen (TYLENOL) 325 MG tablet Take 650 mg by mouth every 6 (six) hours as needed for pain.      Marland Kitchen albuterol (PROVENTIL HFA;VENTOLIN HFA) 108 (90 BASE) MCG/ACT inhaler Inhale 2 puffs into the lungs every 6 (six) hours as needed for wheezing.      Marland Kitchen amLODipine (NORVASC) 10 MG tablet Take 10 mg by mouth daily.      . ARIPiprazole (ABILIFY) 5 MG tablet Take 5 mg by mouth daily.       Marland Kitchen aspirin EC 81 MG tablet Take 81 mg by mouth daily.      . carvedilol (COREG) 25 MG tablet Take 25 mg by mouth 2 (two) times daily with a meal.      . furosemide (LASIX) 40 MG tablet Take 40 mg by mouth 2 (two) times daily.      . hydrALAZINE (APRESOLINE) 100 MG tablet Take 100 mg by mouth 2 (two) times daily.      . hydroxypropyl methylcellulose (ISOPTO TEARS) 2.5 % ophthalmic solution Place 1 drop into both eyes 4 (four) times daily as needed (dry eyes).      . metoCLOPramide (  REGLAN) 10 MG tablet Take 1 tablet (10 mg total) by mouth 4 (four) times daily.  30 tablet  3  . omeprazole (PRILOSEC) 40 MG capsule Take 40 mg by mouth 2 (two) times daily as needed (heartburn).       No current facility-administered medications for this visit.   Family History  Problem Relation Age of Onset  . Hypertension Mother   . Heart disease Mother   . Heart disease Brother    History   Social History  . Marital Status: Married    Spouse Name: N/A    Number of Children: N/A  . Years of Education: N/A   Occupational History  . disabled    Social History Main Topics  . Smoking status: Former Smoker -- 1.50 packs/day for 30 years    Types: Cigarettes    Quit date: 03/21/2011  . Smokeless tobacco: Never Used  . Alcohol Use: No     Comment: Quit alcohol in July of 2011  . Drug Use: No     Comment: None since November  "?2012" when had rectal bleeding and went to hospital  . Sexual Activity: Not Currently   Other Topics Concern  . Not on file   Social History Narrative   09/09/2012   He lives with mother and is separated from wife. Daughter in [place] with 2 grandchildren. He denies any cigs, drugs, alcohol at present.    Review of Systems: Constitutional: Denies fever, chills, diaphoresis, appetite change and fatigue.  Respiratory: Denies SOB, DOE, cough, chest tightness, and wheezing. Denies chest pain. Cardiovascular: No chest pain, palpitations and leg swelling.  Gastrointestinal: No abdominal pain, nausea, vomiting, bloody stools Genitourinary: No dysuria, frequency, hematuria, or flank pain.  Musculoskeletal: No myalgias, back pain, joint swelling, arthralgias  Psych: No depression symptoms. No SI or SA.   Objective:  Physical Exam: Filed Vitals:   09/17/13 1738  BP: 111/69  Pulse: 70  Temp: 97.1 F (36.2 C)   General: Well nourished. No acute distress.  HEENT: Normal oral mucosa. MMM.  Lungs: CTA bilaterally. Heart: RRR; no extra sounds or murmurs  Abdomen: Non-distended, normal BS, soft, nontender; no hepatosplenomegaly  Extremities: No pedal edema. No joint swelling or tenderness. Neurologic: Normal EOM,  Alert and oriented x3. No obvious neurologic/cranial nerve deficits.  Assessment & Plan:  I have discussed my assessment and plan  with  my attending in the clinic, Dr. Meredith Winters  as detailed under problem based charting.

## 2013-09-17 NOTE — Assessment & Plan Note (Signed)
He reports compliance with his medications. Weight is up to 164 pounds from 158 lb after discharge. Dry wt around 140 lb. Examination without evidence of fluid overload. No change in his medications. Will continue with Lasix 40 mg daily. Keep appt with Dr Excell Seltzer on 12/26

## 2013-09-17 NOTE — Patient Instructions (Signed)
You cough sounds to be doing better  Please take your Robitussin for your cough Please follow up with Dr Glendell Docker in 2-3 months

## 2013-09-17 NOTE — Assessment & Plan Note (Signed)
Symptoms have largely resolved. I encouraged him to continue using Robitussin only as needed.

## 2013-09-18 NOTE — ED Provider Notes (Signed)
I saw and evaluated the patient, reviewed the resident's note and I agree with the findings and plan.   .Face to face Exam:  General:  Awake HEENT:  Atraumatic Resp:  Normal effort Abd:  Nondistended Neuro:No focal weakness  Nelia Shi, MD 09/18/13 1210

## 2013-09-27 NOTE — ED Provider Notes (Signed)
I reviewed and agree with resident's EKG interpretation  Nelia Shi, MD 09/27/13 1719

## 2013-10-06 ENCOUNTER — Encounter: Payer: Self-pay | Admitting: Internal Medicine

## 2013-10-12 NOTE — Progress Notes (Signed)
Case discussed with Dr. Kazibwe soon after the resident saw the patient.  We reviewed the resident's history and exam and pertinent patient test results.  I agree with the assessment, diagnosis, and plan of care documented in the resident's note. 

## 2013-10-14 ENCOUNTER — Encounter: Payer: Self-pay | Admitting: Nurse Practitioner

## 2013-10-14 ENCOUNTER — Ambulatory Visit: Payer: Medicaid Other | Admitting: Cardiovascular Disease

## 2013-10-14 ENCOUNTER — Ambulatory Visit (INDEPENDENT_AMBULATORY_CARE_PROVIDER_SITE_OTHER): Payer: Medicaid Other | Admitting: Nurse Practitioner

## 2013-10-14 VITALS — BP 130/70 | HR 68 | Ht 66.0 in | Wt 156.0 lb

## 2013-10-14 DIAGNOSIS — Z952 Presence of prosthetic heart valve: Secondary | ICD-10-CM

## 2013-10-14 DIAGNOSIS — I10 Essential (primary) hypertension: Secondary | ICD-10-CM

## 2013-10-14 DIAGNOSIS — Z954 Presence of other heart-valve replacement: Secondary | ICD-10-CM

## 2013-10-14 NOTE — Patient Instructions (Signed)
Stay on your current medicines  Be good to your self  See Dr. Excell Seltzer in 6 months  Call the [facility] office at [phone] if you have any questions, problems or concerns.

## 2013-10-14 NOTE — Progress Notes (Addendum)
Ryan Winters Date of Birth: 1961-08-02 Medical Record #[id number]  History of Present Illness: Mr. Hollars is seen back for a 3 month check. Seen for Dr. Excell Seltzer. He has valvular heart disease with  Prior MV repair. Has continued to have NYHA class III symptoms with recurrent severe R and underwent redo MVR and tricuspid annuloplasty in November of 2013. Other issues include schizophrenia, HIV, gastroparesis, CKD - stage 4, malignant HTN and polysubstance abuse - "handles cocine". Has minimal CAD per cath prior to his last heart surgery. Other issues as noted below.   I saw him back in August. Seemed to be doing ok. Has had issues with compliance in the past. I increased his Hydralazine to 100 mg TID at last visit.   Was in the hospital back in October - sounds like a URI.   Comes in today. Here alone. Doing ok. Says he has been doing ok. Notes some skipping of his heart every now and then. Not dizzy or lightheaded. Some shortness of breath but nothing that has gotten any worse. Says he is "behaving" and taking his medicines.    Current Outpatient Prescriptions  Medication Sig Dispense Refill  . acetaminophen (TYLENOL) 325 MG tablet Take 650 mg by mouth every 6 (six) hours as needed for pain.      Marland Kitchen albuterol (PROVENTIL HFA;VENTOLIN HFA) 108 (90 BASE) MCG/ACT inhaler Inhale 2 puffs into the lungs every 6 (six) hours as needed for wheezing.      Marland Kitchen amLODipine (NORVASC) 10 MG tablet Take 10 mg by mouth daily.      . ARIPiprazole (ABILIFY) 5 MG tablet Take 5 mg by mouth daily.       Marland Kitchen aspirin EC 81 MG tablet Take 81 mg by mouth daily.      . carvedilol (COREG) 25 MG tablet Take 25 mg by mouth 2 (two) times daily with a meal.      . furosemide (LASIX) 40 MG tablet Take 40 mg by mouth 2 (two) times daily.      . hydrALAZINE (APRESOLINE) 100 MG tablet Take 100 mg by mouth 2 (two) times daily.      . metoCLOPramide (REGLAN) 10 MG tablet Take 1 tablet (10 mg total) by mouth 4 (four) times daily.   30 tablet  3  . omeprazole (PRILOSEC) 40 MG capsule Take 40 mg by mouth 2 (two) times daily as needed (heartburn).       No current facility-administered medications for this visit.    Allergies  Allergen Reactions  . Cashew Nut Oil Anaphylaxis  . Food Anaphylaxis    Strawberries, Cashews, causes throat to swell - affects breathing    Past Medical History  Diagnosis Date  . Hypertension   . Bipolar disorder   . Schizophrenia     h/o hospitalized inpt therapy @ the [facility]  . Valvular heart disease     a. 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 b. 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  Marland Kitchen Hyperlipidemia   . H/O: suicide attempt 1998    was hospitalized at [facility]  . Anemia     Hx of iron infusions  . Gastroparesis   . Arthritis   . Cocaine abuse     per pt none x 3 yrs however continues to "handle" it and deal it with + UDS.  Marland Kitchen Anxiety   . CHF (congestive heart failure)     a. EF 25-30% by echo 12/02/12, but 55% by  TEE 12/04/12.  Marland Kitchen GERD (gastroesophageal reflux disease)   . Depression   . Vertigo   . Itchy skin   . Peripheral neuropathy     BLE  . Bilateral lower abdominal cramping   . Shortness of breath     "periodically; happens at any time" (03/30/13)  . Generalized headaches     "monthly" (03/30/2013)  . Chronic kidney disease     has fistula, not on HD; "go to [facility] q 90 days to have kidneys checked out" (03/30/2013)  . Atrial tachycardia     a. dx 11/2012: tx with amiodarone, cardioversion (discharge rhythm: likely junctional).    Past Surgical History  Procedure Laterality Date  . Mitral valve repair  05/2010  . Av fistula placement, radiocephalic Left 05/24/2011    arm; "aien't never had to use it" (03/30/2013)  . Laceration repair Right ~ 1979  . Umbilical hernia repair  04/01/2012    Procedure: HERNIA REPAIR UMBILICAL ADULT;  Surgeon: Ardeth Sportsman, MD;  Location: [facility] OR;  Service: General;  Laterality: N/A;   supra umbilical  . Tee without cardioversion  08/25/2012    Procedure: TRANSESOPHAGEAL ECHOCARDIOGRAM (TEE);  Surgeon: Wendall Stade, MD;  Location: [facility] ENDOSCOPY;  Service: Cardiovascular;  Laterality: N/A;  . Mitral valve replacement  09/23/2012    Procedure: REDO MITRAL VALVE REPLACEMENT (MVR);  Surgeon: [name], MD;  Location: [facility] OR;  Service: Open Heart Surgery;  Laterality: N/A;  Nitric Oxide  . Tricuspid valve replacement  09/23/2012    Procedure: TRICUSPID VALVE REPAIR;  Surgeon: [name], MD;  Location: [facility] OR;  Service: Open Heart Surgery;  Laterality: N/A;  . Tee without cardioversion  12/04/2012    Procedure: TRANSESOPHAGEAL ECHOCARDIOGRAM (TEE);  Surgeon: Laurey Morale, MD;  Location: [facility] ENDOSCOPY;  Service: Cardiovascular;  Laterality: N/A;  CV 1015/vicki, dl  . Cardioversion  2/95/6213    Procedure: CARDIOVERSION;  Surgeon: Laurey Morale, MD;  Location: [facility] ENDOSCOPY;  Service: Cardiovascular;  Laterality: N/A;  . Cardiac valve replacement      mitral  . Cardiac catheterization  05/26/2010    Hattie Perch 03/30/2013    History  Smoking status  . Former Smoker -- 1.50 packs/day for 30 years  . Types: Cigarettes  . Quit date: 03/21/2011  Smokeless tobacco  . Never Used    History  Alcohol Use No    Comment: Quit alcohol in July of 2011    Family History  Problem Relation Age of Onset  . Hypertension Mother   . Heart disease Mother   . Heart disease Brother     Review of Systems: The review of systems is per the HPI.  Will have periodic vomiting from his gastroparesis at times. All other systems were reviewed and are negative.  Physical Exam: BP 130/70  Pulse 68  Ht 5\' 6"  (1.676 m)  Wt 156 lb (70.761 kg)  BMI 25.19 kg/m2  SpO2 100% Patient is alert and in no acute distress. Weight is down 8 pounds. Skin is warm and dry. Color is normal.  HEENT is unremarkable. Normocephalic/atraumatic. PERRL. Sclera are nonicteric. Neck is supple. No masses. No JVD.  Lungs are clear. Cardiac exam shows a regular rate and rhythm. MV sounds ok. Abdomen is soft. Extremities are without edema. Gait and ROM are intact. No gross neurologic deficits noted.  Wt Readings from Last 3 Encounters:  10/14/13 156 lb (70.761 kg)  09/17/13 164 lb 4.8 oz (74.526 kg)  09/14/13 158 lb  11.7 oz (72 kg)    LABORATORY DATA:  Lab Results  Component Value Date   WBC 6.4 09/14/2013   HGB 11.5* 09/14/2013   HCT 35.1* 09/14/2013   PLT 115* 09/14/2013   GLUCOSE 106* 09/14/2013   CHOL 152 06/10/2013   TRIG 59 06/10/2013   HDL 67 06/10/2013   LDLCALC 73 06/10/2013   ALT 25 09/10/2013   AST 27 09/10/2013   NA 139 09/14/2013   K 3.6 09/14/2013   CL 105 09/14/2013   CREATININE 3.38* 09/14/2013   BUN 48* 09/14/2013   CO2 22 09/14/2013   TSH 1.449 06/18/2013   INR 1.34 09/10/2013   HGBA1C 4.7 09/19/2012   MICROALBUR 0.64 05/25/2010    Assessment / Plan: 1. Malignant HTN - His blood pressure looks good. I have left him on his current regimen.   2. Progressive CKD - followed by nephrology.  3. MVR with echo 6 months ago showing satisfactory function - will follow.  4. Polysubstance abuse - says he is staying clean. This will be the crux of his issues.   5. Palpitations - would continue with his Coreg and maintain potassium levels.   I think overall he is doing well. Needs to stay compliant with his medicines. See him back in 6 months.   Patient is agreeable to this plan and will call if any problems develop in the interim.   Rosalio Macadamia, RN, ANP-C [facility] [address] [place], [place]  [address]

## 2013-11-20 ENCOUNTER — Observation Stay (HOSPITAL_COMMUNITY)
Admission: EM | Admit: 2013-11-20 | Discharge: 2013-11-22 | Disposition: A | Discharge status: Home / Self Care | Payer: Medicaid Other | Attending: Internal Medicine | Admitting: Internal Medicine

## 2013-11-20 ENCOUNTER — Ambulatory Visit: Payer: Self-pay | Admitting: Internal Medicine

## 2013-11-20 ENCOUNTER — Emergency Department (HOSPITAL_COMMUNITY): Payer: Medicaid Other

## 2013-11-20 ENCOUNTER — Encounter (HOSPITAL_COMMUNITY): Payer: Self-pay | Admitting: Emergency Medicine

## 2013-11-20 DIAGNOSIS — Z79899 Other long term (current) drug therapy: Secondary | ICD-10-CM | POA: Insufficient documentation

## 2013-11-20 DIAGNOSIS — R0602 Shortness of breath: Secondary | ICD-10-CM | POA: Insufficient documentation

## 2013-11-20 DIAGNOSIS — F209 Schizophrenia, unspecified: Secondary | ICD-10-CM | POA: Diagnosis present

## 2013-11-20 DIAGNOSIS — I129 Hypertensive chronic kidney disease with stage 1 through stage 4 chronic kidney disease, or unspecified chronic kidney disease: Secondary | ICD-10-CM

## 2013-11-20 DIAGNOSIS — Z87891 Personal history of nicotine dependence: Secondary | ICD-10-CM | POA: Insufficient documentation

## 2013-11-20 DIAGNOSIS — N189 Chronic kidney disease, unspecified: Secondary | ICD-10-CM | POA: Insufficient documentation

## 2013-11-20 DIAGNOSIS — R079 Chest pain, unspecified: Secondary | ICD-10-CM

## 2013-11-20 DIAGNOSIS — I4719 Other supraventricular tachycardia: Secondary | ICD-10-CM | POA: Diagnosis present

## 2013-11-20 DIAGNOSIS — I509 Heart failure, unspecified: Principal | ICD-10-CM

## 2013-11-20 DIAGNOSIS — I38 Endocarditis, valve unspecified: Secondary | ICD-10-CM | POA: Insufficient documentation

## 2013-11-20 DIAGNOSIS — Z7982 Long term (current) use of aspirin: Secondary | ICD-10-CM | POA: Insufficient documentation

## 2013-11-20 DIAGNOSIS — N184 Chronic kidney disease, stage 4 (severe): Secondary | ICD-10-CM

## 2013-11-20 DIAGNOSIS — N039 Chronic nephritic syndrome with unspecified morphologic changes: Secondary | ICD-10-CM

## 2013-11-20 DIAGNOSIS — F1910 Other psychoactive substance abuse, uncomplicated: Secondary | ICD-10-CM

## 2013-11-20 DIAGNOSIS — F319 Bipolar disorder, unspecified: Secondary | ICD-10-CM

## 2013-11-20 DIAGNOSIS — M129 Arthropathy, unspecified: Secondary | ICD-10-CM | POA: Insufficient documentation

## 2013-11-20 DIAGNOSIS — Z952 Presence of prosthetic heart valve: Secondary | ICD-10-CM

## 2013-11-20 DIAGNOSIS — I498 Other specified cardiac arrhythmias: Secondary | ICD-10-CM

## 2013-11-20 DIAGNOSIS — K219 Gastro-esophageal reflux disease without esophagitis: Secondary | ICD-10-CM | POA: Diagnosis present

## 2013-11-20 DIAGNOSIS — R0989 Other specified symptoms and signs involving the circulatory and respiratory systems: Secondary | ICD-10-CM | POA: Insufficient documentation

## 2013-11-20 DIAGNOSIS — I5042 Chronic combined systolic (congestive) and diastolic (congestive) heart failure: Secondary | ICD-10-CM | POA: Diagnosis present

## 2013-11-20 DIAGNOSIS — I10 Essential (primary) hypertension: Secondary | ICD-10-CM | POA: Diagnosis present

## 2013-11-20 DIAGNOSIS — K3184 Gastroparesis: Secondary | ICD-10-CM

## 2013-11-20 DIAGNOSIS — E785 Hyperlipidemia, unspecified: Secondary | ICD-10-CM | POA: Insufficient documentation

## 2013-11-20 DIAGNOSIS — I340 Nonrheumatic mitral (valve) insufficiency: Secondary | ICD-10-CM

## 2013-11-20 DIAGNOSIS — G609 Hereditary and idiopathic neuropathy, unspecified: Secondary | ICD-10-CM | POA: Insufficient documentation

## 2013-11-20 DIAGNOSIS — D631 Anemia in chronic kidney disease: Secondary | ICD-10-CM

## 2013-11-20 DIAGNOSIS — Z95818 Presence of other cardiac implants and grafts: Secondary | ICD-10-CM | POA: Insufficient documentation

## 2013-11-20 DIAGNOSIS — F1410 Cocaine abuse, uncomplicated: Secondary | ICD-10-CM

## 2013-11-20 DIAGNOSIS — D649 Anemia, unspecified: Secondary | ICD-10-CM | POA: Insufficient documentation

## 2013-11-20 DIAGNOSIS — I471 Supraventricular tachycardia: Secondary | ICD-10-CM

## 2013-11-20 DIAGNOSIS — Z9189 Other specified personal risk factors, not elsewhere classified: Secondary | ICD-10-CM

## 2013-11-20 DIAGNOSIS — Z872 Personal history of diseases of the skin and subcutaneous tissue: Secondary | ICD-10-CM | POA: Insufficient documentation

## 2013-11-20 DIAGNOSIS — F411 Generalized anxiety disorder: Secondary | ICD-10-CM | POA: Insufficient documentation

## 2013-11-20 LAB — RAPID URINE DRUG SCREEN, HOSP PERFORMED
AMPHETAMINES: NOT DETECTED
BARBITURATES: NOT DETECTED
Benzodiazepines: NOT DETECTED
Cocaine: POSITIVE — AB
Opiates: NOT DETECTED
TETRAHYDROCANNABINOL: NOT DETECTED

## 2013-11-20 LAB — COMPREHENSIVE METABOLIC PANEL
ALK PHOS: 215 U/L — AB (ref 39–117)
ALT: 33 U/L (ref 0–53)
AST: 36 U/L (ref 0–37)
Albumin: 3.2 g/dL — ABNORMAL LOW (ref 3.5–5.2)
BILIRUBIN TOTAL: 0.9 mg/dL (ref 0.3–1.2)
BUN: 53 mg/dL — AB (ref 6–23)
CHLORIDE: 103 meq/L (ref 96–112)
CO2: 19 mEq/L (ref 19–32)
CREATININE: 3.43 mg/dL — AB (ref 0.50–1.35)
Calcium: 8.6 mg/dL (ref 8.4–10.5)
GFR calc non Af Amer: 19 mL/min — ABNORMAL LOW (ref >90)
GFR, EST AFRICAN AMERICAN: 22 mL/min — AB (ref >90)
Glucose, Bld: 103 mg/dL — ABNORMAL HIGH (ref 70–99)
Potassium: 4.4 mEq/L (ref 3.7–5.3)
Sodium: 137 mEq/L (ref 137–147)
TOTAL PROTEIN: 7.5 g/dL (ref 6.0–8.3)

## 2013-11-20 LAB — POCT I-STAT TROPONIN I: Troponin i, poc: 0.13 ng/mL (ref 0.00–0.08)

## 2013-11-20 LAB — TROPONIN I
Troponin I: <0.3 ng/mL (ref <0.30)
Troponin I: <0.3 ng/mL (ref <0.30)
Troponin I: <0.3 ng/mL (ref <0.30)

## 2013-11-20 LAB — CBC WITH DIFFERENTIAL/PLATELET
BASOS PCT: 1 % (ref 0–1)
Basophils Absolute: 0.1 10*3/uL (ref 0.0–0.1)
EOS ABS: 0.1 10*3/uL (ref 0.0–0.7)
Eosinophils Relative: 3 % (ref 0–5)
HEMATOCRIT: 35.4 % — AB (ref 39.0–52.0)
HEMOGLOBIN: 12.1 g/dL — AB (ref 13.0–17.0)
Lymphocytes Relative: 39 % (ref 12–46)
Lymphs Abs: 2.2 10*3/uL (ref 0.7–4.0)
MCH: 27.3 pg (ref 26.0–34.0)
MCHC: 34.2 g/dL (ref 30.0–36.0)
MCV: 79.9 fL (ref 78.0–100.0)
MONOS PCT: 11 % (ref 3–12)
Monocytes Absolute: 0.6 10*3/uL (ref 0.1–1.0)
Neutro Abs: 2.7 10*3/uL (ref 1.7–7.7)
Neutrophils Relative %: 47 % (ref 43–77)
Platelets: 119 10*3/uL — ABNORMAL LOW (ref 150–400)
RBC: 4.43 MIL/uL (ref 4.22–5.81)
RDW: 19.5 % — ABNORMAL HIGH (ref 11.5–15.5)
WBC: 5.6 10*3/uL (ref 4.0–10.5)

## 2013-11-20 LAB — PRO B NATRIURETIC PEPTIDE: PRO B NATRI PEPTIDE: 9651 pg/mL — AB (ref 0–125)

## 2013-11-20 LAB — PROTIME-INR
INR: 1.22 (ref 0.00–1.49)
Prothrombin Time: 15.1 seconds (ref 11.6–15.2)

## 2013-11-20 MED ORDER — HEPARIN SODIUM (PORCINE) 5000 UNIT/ML IJ SOLN
5000.0000 [IU] | Freq: Three times a day (TID) | INTRAMUSCULAR | Status: DC
  Filled 2013-11-20 (×9): qty 1

## 2013-11-20 MED ORDER — FUROSEMIDE 10 MG/ML IJ SOLN
60.0000 mg | Freq: Once | INTRAMUSCULAR | Status: AC
  Administered 2013-11-20: 60 mg via INTRAVENOUS
  Filled 2013-11-20: qty 6

## 2013-11-20 MED ORDER — FUROSEMIDE 40 MG PO TABS
40.0000 mg | ORAL_TABLET | Freq: Two times a day (BID) | ORAL | Status: DC
  Administered 2013-11-20 – 2013-11-22 (×4): 40 mg via ORAL
  Filled 2013-11-20 (×6): qty 1

## 2013-11-20 MED ORDER — ASPIRIN 325 MG PO TABS: 325.0000 mg | ORAL_TABLET | Freq: Once | ORAL | Status: DC

## 2013-11-20 MED ORDER — METOCLOPRAMIDE HCL 10 MG PO TABS: 10.0000 mg | ORAL_TABLET | Freq: Four times a day (QID) | ORAL | Status: DC

## 2013-11-20 MED ORDER — METOCLOPRAMIDE HCL 5 MG PO TABS
5.0000 mg | ORAL_TABLET | Freq: Four times a day (QID) | ORAL | Status: DC
  Administered 2013-11-20 – 2013-11-22 (×8): 5 mg via ORAL
  Filled 2013-11-20 (×11): qty 1

## 2013-11-20 MED ORDER — ASPIRIN EC 81 MG PO TBEC
81.0000 mg | DELAYED_RELEASE_TABLET | Freq: Every day | ORAL | Status: DC
  Administered 2013-11-21 – 2013-11-22 (×2): 81 mg via ORAL
  Filled 2013-11-20 (×2): qty 1

## 2013-11-20 MED ORDER — LORAZEPAM 2 MG/ML IJ SOLN
1.0000 mg | Freq: Once | INTRAMUSCULAR | Status: AC
  Administered 2013-11-20: 1 mg via INTRAVENOUS
  Filled 2013-11-20: qty 1

## 2013-11-20 MED ORDER — HYDROCORTISONE 1 % EX CREA
TOPICAL_CREAM | CUTANEOUS | Status: DC | PRN
  Administered 2013-11-21 (×2): via TOPICAL
  Filled 2013-11-20: qty 28

## 2013-11-20 MED ORDER — SODIUM CHLORIDE 0.9 % IV SOLN
1000.0000 mL | INTRAVENOUS | Status: DC
  Administered 2013-11-20: 1000 mL via INTRAVENOUS

## 2013-11-20 MED ORDER — SODIUM CHLORIDE 0.9 % IJ SOLN: 3.0000 mL | INTRAMUSCULAR | Status: DC | PRN

## 2013-11-20 MED ORDER — ACETAMINOPHEN 325 MG PO TABS
650.0000 mg | ORAL_TABLET | Freq: Four times a day (QID) | ORAL | Status: DC | PRN
  Administered 2013-11-20 – 2013-11-21 (×2): 650 mg via ORAL
  Filled 2013-11-20 (×2): qty 2

## 2013-11-20 MED ORDER — SODIUM CHLORIDE 0.9 % IJ SOLN: 3.0000 mL | Freq: Two times a day (BID) | INTRAMUSCULAR | Status: DC

## 2013-11-20 MED ORDER — CARVEDILOL 25 MG PO TABS
25.0000 mg | ORAL_TABLET | Freq: Two times a day (BID) | ORAL | Status: DC
  Administered 2013-11-20 – 2013-11-22 (×5): 25 mg via ORAL
  Filled 2013-11-20 (×7): qty 1

## 2013-11-20 MED ORDER — SODIUM CHLORIDE 0.9 % IV SOLN
250.0000 mL | INTRAVENOUS | Status: DC | PRN
Start: 2013-11-20 — End: 2013-11-22

## 2013-11-20 MED ORDER — DILTIAZEM HCL 100 MG IV SOLR
5.0000 mg/h | INTRAVENOUS | Status: DC
  Administered 2013-11-20 (×2): 5 mg/h via INTRAVENOUS
  Filled 2013-11-20 (×2): qty 100

## 2013-11-20 MED ORDER — DILTIAZEM LOAD VIA INFUSION
15.0000 mg | Freq: Once | INTRAVENOUS | Status: AC
  Administered 2013-11-20 (×2): 15 mg via INTRAVENOUS
  Filled 2013-11-20: qty 15

## 2013-11-20 MED ORDER — ARIPIPRAZOLE 5 MG PO TABS
5.0000 mg | ORAL_TABLET | Freq: Every day | ORAL | Status: DC
  Administered 2013-11-20 – 2013-11-21 (×2): 5 mg via ORAL
  Filled 2013-11-20 (×4): qty 1

## 2013-11-20 MED ORDER — SODIUM CHLORIDE 0.9 % IV SOLN
1000.0000 mL | Freq: Once | INTRAVENOUS | Status: AC
  Administered 2013-11-20: 1000 mL via INTRAVENOUS

## 2013-11-20 MED ORDER — ALBUTEROL SULFATE HFA 108 (90 BASE) MCG/ACT IN AERS
2.0000 | INHALATION_SPRAY | Freq: Four times a day (QID) | RESPIRATORY_TRACT | Status: DC | PRN
  Filled 2013-11-20: qty 6.7

## 2013-11-20 MED ORDER — NITROGLYCERIN 0.4 MG SL SUBL: 0.4000 mg | SUBLINGUAL_TABLET | SUBLINGUAL | Status: DC | PRN

## 2013-11-20 MED ORDER — PANTOPRAZOLE SODIUM 40 MG PO TBEC
40.0000 mg | DELAYED_RELEASE_TABLET | Freq: Every day | ORAL | Status: DC
  Administered 2013-11-20 – 2013-11-22 (×3): 40 mg via ORAL
  Filled 2013-11-20 (×3): qty 1

## 2013-11-20 MED ORDER — HYDRALAZINE HCL 50 MG PO TABS
100.0000 mg | ORAL_TABLET | Freq: Two times a day (BID) | ORAL | Status: DC
Start: 2013-11-20 — End: 2013-11-22
  Administered 2013-11-20 – 2013-11-22 (×5): 100 mg via ORAL
  Filled 2013-11-20 (×6): qty 2

## 2013-11-20 MED ORDER — MORPHINE SULFATE 4 MG/ML IJ SOLN
4.0000 mg | Freq: Once | INTRAMUSCULAR | Status: AC
  Administered 2013-11-20: 4 mg via INTRAVENOUS
  Filled 2013-11-20: qty 1

## 2013-11-20 NOTE — ED Notes (Signed)
Internal Medicine Consult at bedside.

## 2013-11-20 NOTE — ED Notes (Signed)
Pt states that he was under a lot of stress during the holidays (patient and wife are separated) and when he thinks about it his CP started Christmas while cooking with his mom. Pt states that he also helped New Years. Pt states that the pain is on his left side of his chest. Pt denies coccaine or substance abuse. Pt denies SOB, N/V. Pt states PCP appointment this morning but could not wait because the pain increased.

## 2013-11-20 NOTE — ED Notes (Signed)
Per EMS: pt states that he is having CP that is left sided. Pt states N/V and HA. Pt given 324mg  of aspirin en route with EMS. Pt denied nitro due to already having a HA.

## 2013-11-20 NOTE — ED Notes (Signed)
Care transferred, report received. 

## 2013-11-20 NOTE — H&P (Signed)
Hospital Admission Note Date: 11/20/2013  Patient name: Ryan Winters Medical record number: [id number] Date of birth: 12-21-1960 Age: 53 y.o. Gender: male PCP: Pleas Koch, MD  Medical Service: IMTS   Attending physician: Dr. Josem Kaufmann  Internal Medicine Teaching Service Contact Information  Weekday Hours (7AM-5PM):   1st contact: Marjan Rabbani            pgr:  [phone]   2nd contact: Darden Palmer          pgr: [phone]  ** If no return call within 15 minutes (after trying both pagers listed above), please call after hours pagers.   After 5 pm or weekends: 1st Contact: Pager: [phone] 2nd Contact: Pager: [phone]  Chief Complaint: Chest pain  History of Present Illness: Patient  is a 53 yo AA male with PMH of cocaine abuse, chronic combined systolic diastolic CHF (EF 57-84% on 03/31/2013), ectopic atrial rhythm s/p cardioversion (Jan 2104), tricuspid and mitral valvular disorder s/p mitral valve replacement and tricuspid repair (2013), Chronic Renal Kidney Disease Stage IV s/p AV fistula without hemodialysis (creatinine 2.7-3.7), schizophrenia and bipolar disorder and CAD, who presents with chest pain.   Patient has a history of medication noncompliance, but states that he has been taking all medications regularly. Patient reports that he had a chest pain 3 days ago when he was helping his mother in cooking food. His chest pain started suddenly. It is located at the left lower chest, just about the rib cage. It is 10 out of 10 in severity, sharp and radiating to the left arm. His chest pain resolved after he took Tylenol, aspirin and having a rest. It lasted for about 5 min. He was doing fine until this morning. At about 3:00 AM, he started having another episode of chest pain, which is very similar to the previous one. He called 911. He was brought to ED by EMS. He was given 324 mg of ASA by EMS. He was found to have tachycardia with heart rates between 120 to 130 in Ed.  EKG  showed ectopic atrial tachycardia.  His body weight increased by 20 pounds per pt. He was started with Cardizem drip and given 1 L of NS in ED. He was also given one dose of Lasix 60 mg. When I saw patient in ED, his chest pain subsided to 2/10 in severity. He does not have cough. His temperature is normal. He states that his chest pain is aggravated by deep breath, which he feels like muscle spasm on the left lower chest, just above the rib cage. No recent long distance traveling. No tenderness over the calf areas. Patient denies cocaine use, but UDS is positive for cocaine.  ROS:  Denies fatigue, cough, SOB,  abdominal pain, diarrhea, constipation, dysuria, urgency, frequency, hematuria, joint pain or leg swelling. Has subjective fever and chills, but body temperature is normal in ED.   Meds: Current Outpatient Rx  Name  Route  Sig  Dispense  Refill  . acetaminophen (TYLENOL) 325 MG tablet   Oral   Take 650 mg by mouth every 6 (six) hours as needed for pain.         Marland Kitchen albuterol (PROVENTIL HFA;VENTOLIN HFA) 108 (90 BASE) MCG/ACT inhaler   Inhalation   Inhale 2 puffs into the lungs every 6 (six) hours as needed for wheezing.         Marland Kitchen amLODipine (NORVASC) 10 MG tablet   Oral   Take 10 mg by mouth daily.         Marland Kitchen  ARIPiprazole (ABILIFY) 5 MG tablet   Oral   Take 5 mg by mouth daily.          Marland Kitchen aspirin EC 81 MG tablet   Oral   Take 81 mg by mouth daily.         . carvedilol (COREG) 25 MG tablet   Oral   Take 25 mg by mouth 2 (two) times daily with a meal.         . furosemide (LASIX) 40 MG tablet   Oral   Take 40 mg by mouth 2 (two) times daily.         . hydrALAZINE (APRESOLINE) 100 MG tablet   Oral   Take 100 mg by mouth 2 (two) times daily.         . metoCLOPramide (REGLAN) 10 MG tablet   Oral   Take 1 tablet (10 mg total) by mouth 4 (four) times daily.   30 tablet   3   . omeprazole (PRILOSEC) 40 MG capsule   Oral   Take 40 mg by mouth 2 (two) times  daily as needed (heartburn).           Allergies: Allergies as of 11/20/2013 - Review Complete 11/20/2013  Allergen Reaction Noted  . 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 Food Anaphylaxis 06/20/2011   Past Medical History  Diagnosis Date  . Hypertension   . Bipolar disorder   . Schizophrenia     h/o hospitalized inpt therapy @ the [facility]  . Valvular heart disease     a. 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 b. 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  Marland Kitchen Hyperlipidemia   . H/O: suicide attempt 1998    was hospitalized at [facility]  . Anemia     Hx of iron infusions  . Gastroparesis   . Arthritis   . Cocaine abuse     per pt none x 3 yrs however continues to "handle" it and deal it with + UDS.  Marland Kitchen Anxiety   . CHF (congestive heart failure)     a. EF 25-30% by echo 12/02/12, but 55% by TEE 12/04/12.  Marland Kitchen GERD (gastroesophageal reflux disease)   . Depression   . Vertigo   . Itchy skin   . Peripheral neuropathy     BLE  . Bilateral lower abdominal cramping   . Shortness of breath     "periodically; happens at any time" (03/30/13)  . Generalized headaches     "monthly" (03/30/2013)  . Chronic kidney disease     has fistula, not on HD; "go to [facility] q 90 days to have kidneys checked out" (03/30/2013)  . Atrial tachycardia     a. dx 11/2012: tx with amiodarone, cardioversion (discharge rhythm: likely junctional).   Past Surgical History  Procedure Laterality Date  . Mitral valve repair  05/2010  . Av fistula placement, radiocephalic Left 05/24/2011    arm; "aien't never had to use it" (03/30/2013)  . Laceration repair Right ~ 1979  . Umbilical hernia repair  04/01/2012    Procedure: HERNIA REPAIR UMBILICAL ADULT;  Surgeon: Ardeth Sportsman, MD;  Location: [facility] OR;  Service: General;  Laterality: N/A;  supra umbilical  . Tee without cardioversion  08/25/2012    Procedure: TRANSESOPHAGEAL ECHOCARDIOGRAM (TEE);  Surgeon: Wendall Stade, MD;  Location: [facility] ENDOSCOPY;  Service: Cardiovascular;  Laterality: N/A;  . Mitral valve replacement  09/23/2012    Procedure: REDO  MITRAL VALVE REPLACEMENT (MVR);  Surgeon: Kerin PernaPeter Van Trigt, MD;  [facility];  Service: Open Heart Surgery;  Laterality: N/A;  Nitric Oxide  . Tricuspid valve replacement  09/23/2012    Procedure: TRICUSPID VALVE REPAIR;  Surgeon: Kerin PernaPeter Van Trigt, MD;  [facility];  Service: Open Heart Surgery;  Laterality: N/A;  . Tee without cardioversion  12/04/2012    Procedure: TRANSESOPHAGEAL ECHOCARDIOGRAM (TEE);  Surgeon: Laurey Moralealton S McLean, MD;  [facility];  Service: Cardiovascular;  Laterality: N/A;  CV 1015/vicki, dl  . Cardioversion  9/51/88411/16/2014    Procedure: CARDIOVERSION;  Surgeon: Laurey Moralealton S McLean, MD;  [facility];  Service: Cardiovascular;  Laterality: N/A;  . Cardiac valve replacement      mitral  . Cardiac catheterization  05/26/2010    Hattie Perch/notes 03/30/2013   Family History  Problem Relation Age of Onset  . Hypertension Mother   . Heart disease Mother   . Heart disease Brother    History   Social History  . Marital Status: Married    Spouse Name: N/A    Number of Children: N/A  . Years of Education: N/A   Occupational History  . disabled    Social History Main Topics  . Smoking status: Former Smoker -- 1.50 packs/day for 30 years    Types: Cigarettes    Quit date: 03/21/2011  . Smokeless tobacco: Never Used  . Alcohol Use: No     Comment: Quit alcohol in July of 2011  . Drug Use: No     Comment: None since November "?2012" when had rectal bleeding and went to hospital  . Sexual Activity: Not Currently   Other Topics Concern  . Not on file   Social History Narrative   09/09/2012   He lives with mother and is separated from wife. Daughter in [place] with 2 grandchildren. He denies any cigs, drugs, alcohol at present.     Review of Systems: Full 14-point review of systems otherwise negative except as noted above in  HPI. Physical Exam:   Filed Vitals:   11/20/13 0915 11/20/13 0930 11/20/13 0945 11/20/13 0959  BP: 133/101 116/72 132/95 136/91  Pulse: 108 40 78 88  Temp:      TempSrc:      Resp: 22 18 23 20   SpO2: 98% 98% 95% 97%   General: Well-developed, well-nourished, in mild distress while coughing; Head: Normocephalic, atraumatic. Eyes: PERRLA, EOMI. Throat: Oropharynx nonerythematous, no exudate appreciated.  Neck: supple, no carotid Bruits, has external jugular vein distention bilaterally Lungs: Normal respiratory effort. Clear to auscultation bilaterally from apices to bases without crackles or wheezes appreciated. Chest wall: there is tenderness over the left lower chest to palpation, just above the left rib cage Heart: Tachycardic, regular rhythm, normal S1 and S2, + murmur 2/6 Abdomen: BS normoactive. Soft, Nondistended, non-tender. No masses or organomegaly appreciated. Extremities: No LE edema, distal pulses intact Neurologic: grossly non-focal, alert and oriented x3, appropriate and cooperative throughout examination.  Psych: Patient is not psychotic, no suicidal or hemocidal ideation.  Lab results: Basic Metabolic Panel:  Recent Labs  66/04/3000/02/15 0425  NA 137  K 4.4  CL 103  CO2 19  GLUCOSE 103*  BUN 53*  CREATININE 3.43*  CALCIUM 8.6   Liver Function Tests:  Recent Labs  11/20/13 0425  AST 36  ALT 33  ALKPHOS 215*  BILITOT 0.9  PROT 7.5  ALBUMIN 3.2*   No results found for this basename: LIPASE, AMYLASE,  in the last  72 hours No results found for this basename: AMMONIA,  in the last 72 hours CBC:  Recent Labs  11/20/13 0425  WBC 5.6  NEUTROABS 2.7  HGB 12.1*  HCT 35.4*  MCV 79.9  PLT 119*   Cardiac Enzymes:  Recent Labs  11/20/13 0425 11/20/13 0722  TROPONINI <0.30 <0.30   BNP:  Recent Labs  11/20/13 0505  PROBNP 9651.0*   D-Dimer: No results found for this basename: DDIMER,  in the last 72 hours CBG: No results found for this  basename: GLUCAP,  in the last 72 hours Hemoglobin A1C: No results found for this basename: HGBA1C,  in the last 72 hours Fasting Lipid Panel: No results found for this basename: CHOL, HDL, LDLCALC, TRIG, CHOLHDL, LDLDIRECT,  in the last 72 hours Thyroid Function Tests: No results found for this basename: TSH, T4TOTAL, FREET4, T3FREE, THYROIDAB,  in the last 72 hours Anemia Panel: No results found for this basename: VITAMINB12, FOLATE, FERRITIN, TIBC, IRON, RETICCTPCT,  in the last 72 hours Coagulation: No results found for this basename: LABPROT, INR,  in the last 72 hours Urine Drug Screen: Drugs of Abuse     Component Value Date/Time   LABOPIA NONE DETECTED 11/20/2013 0505   LABOPIA NEGATIVE 05/10/2012 0433   COCAINSCRNUR POSITIVE* 11/20/2013 0505   COCAINSCRNUR POSITIVE* 05/10/2012 0433   LABBENZ NONE DETECTED 11/20/2013 0505   LABBENZ NEGATIVE 05/10/2012 0433   AMPHETMU NONE DETECTED 11/20/2013 0505   AMPHETMU NEGATIVE 05/10/2012 0433   THCU NONE DETECTED 11/20/2013 0505   LABBARB NONE DETECTED 11/20/2013 0505    Alcohol Level: No results found for this basename: ETH,  in the last 72 hours Urinalysis: No results found for this basename: COLORURINE, APPERANCEUR, LABSPEC, PHURINE, GLUCOSEU, HGBUR, BILIRUBINUR, KETONESUR, PROTEINUR, UROBILINOGEN, NITRITE, LEUKOCYTESUR,  in the last 72 hours Misc. Labs:  Imaging results:  Dg Chest Portable 1 View  11/20/2013   CLINICAL DATA:  Chest pain, shortness of breath  EXAM: PORTABLE CHEST - 1 VIEW  COMPARISON:  09/13/2013.  FINDINGS: Low lung volumes. Cardiac silhouette is enlarged. Patient status post median sternotomy mitral and aortic valve replacement. Minimal areas increased density projects within the lung bases. No focal regions of consolidation identified. Osseous structures unremarkable.  IMPRESSION: Atelectasis versus early or mild infiltrates in the lung bases. No focal regions of consolidation. With technique taking into consideration stable  cardiomegaly.   Electronically Signed   By: Salome Holmes M.D.   On: 11/20/2013 09:05    Other results:  EKG:  ectopic atrial rhythm, tachycardic, normal axis, deep T wave inversion in inferior leads and V4 to V6, which has no significant change compared with previous EKG on 09/13/13. Normal R wave progression.   Assessment & Plan by Problem:  Patient  is a 53 yo AA male with PMH of cocaine abuse, chronic combined systolic diastolic CHF (EF 16-10% on 03/31/2013), ectopic atrial rhythm s/p cardioversion (Jan 2104), tricuspid and mitral valvular disorder s/p mitral valve replacement and tricuspid repair (2013), Chronic Renal Kidney Disease Stage IV s/p AV fistula without hemodialysis (creatinine 2.7-3.7), schizophrenia and bipolar disorder and CAD, who presents with chest pain. Chest x-ray has no pneumonia. No leukocytosis. Creatinine at his baseline. Chest x-ray showed pulmonary venous congestion, but no edema. Pro BNP elevated at 9651. UDS positive for cocaine. POC troponin negative.   Active Problems:   BIPOLAR AFFECTIVE DISORDER   Hypertension   GERD (gastroesophageal reflux disease)   Schizophrenia   Atrial ectopic tachycardia   Chest pain  1. Chest pain: It is most likely secondary to cocaine abuse. Other multiple cardiac issues may have also contributed, including CHF, ectopic atrial tachycardia, and history of cardiac valvular diseases. Cardiac catheter done by Dr. Excell Seltzer on 08/28/12 showed non-obstructive dz in Left mainstem and minimal nonobstructive plaque in left anterior descending (LAD). Other DD include, but less likely, aortic dissection (no typical tearing chest pain, chest x-ray has no mediastinal widening), PE (he has tarcharcardia, oxygen saturation is 99% at room air, no SOB, Well's score is low probability), pneumothorax (negative chest x-ray), esophageal perforation (no recent history of endoscopy) , pneumonia (no leukocytosis, fever, and a negative chest x-ray for PNA), GERD  (on Prilosec at home) and anxiety.  Currently patient feels better, his tachycardia is better controlled with Cardizem drip in ED. He is hemodynamically stable.  Plan  - Will admit to Tele bed for observation as chest pain rule out.  - cycle CE q6 x3 and repeat her EKG in the am  - Continue Cardizem drip, may switch to oral after his HR is controlled - Nitroglycerin and aspirin - will discontinue amlodipine while he is on Cardizem gtt - Consider cardiology consult if test positive for CEs   2. Chronic congestive heart failure: 2D echo showed EF 50-55% on 03/31/2013. Epic review he has had weights down to 140s earlier this year. His BW was 165 Lbs at home 2 days ago per patient. He has pulmonary venous congestion on chest x-ray and external jugular vein distention bilaterally. His proBNP is elevated at 9651 on admission, which was 3457 LBs on 09/13/13. But he does not have any leg edema and oxygen saturation is normal on room air. He is likely to have mild CHF exacerbation. He received 60 mg of Lasix and 1 L of normal saline in ED.   -will continue home dose of lasix 40 mg bid -continue carvedilol home -check daily weights  3. Hypertension: Bp was 143/105 on admission - will resume amlodipine after cardizme is d/c'ed - continue home hydralazine 100 mg twice a day. - continue home lasix and coreg   4. Chronic Kidney Disease Stage 4: Cre is stable at baseline.  Has A-V fistula on the left forearm which is functioning. He is followed up by Dr. Arrie Aran. Last seen was 2 weeks ago.    5. Mental health disorder: he carries the diagnosis of bipolar disorder and Schizophrenia which are stable, only on Abilify at home.  -will continue home abilify  6. GERD: stable on omeprazole -switch to Protonix.   7.  Ectopic atrial tachycardia:  He presents with ectopic atrial tachycardia with heart rate 120 to 130/min. He is hemodynamically stable. He is responding to the Cardizem drip.  -will continue  Cardizem drip, will switch to oral when well controlled. -Continue home Coreg.   8. Gastroparesis: Stable on Reglan 10 mg 4 times a day at home  -will decrease to 5 mg 4 times a day given CKD -IV  #  F/E/N  -NS: SL -Electrolytes: monitor BMP -Diet: Heart health diet  # DVT px: Heparin sq   Dispo: Disposition is deferred at this time, awaiting improvement of current medical problems. Anticipated discharge in approximately 1 day(s).   The patient does have a current PCP Glendell Docker, Cristal Deer, MD), therefore is requiring OPC follow-up after discharge.   The patient does not have transportation limitations that hinder transportation to clinic appointments.  Signed:  Lorretta Harp, MD PGY3, Internal Medicine Teaching Service Pager: [phone]  11/20/2013, 10:24 AM

## 2013-11-20 NOTE — Progress Notes (Signed)
Patient complained of 7 out of 10 chest pain, he refused nitro sublingual but requested iv morphine for treatment of his pain.  I called attending MD, he stated no narcotics for this patient due to history and current UDS positive for cocaine.  I told patient who continued to refuse nitro but then requested tylenol as this is what he takes at home for his chest pain.  Will continue to monitor

## 2013-11-20 NOTE — ED Provider Notes (Signed)
CSN: [id number]     Arrival date & time 11/20/13  0357 History   First MD Initiated Contact with Patient 11/20/13 [phone]     Chief Complaint  Patient presents with  . Chest Pain   (Consider location/radiation/quality/duration/timing/severity/associated sxs/prior Treatment) HPI Presents with increasing chest pain shortness of breath.  He states that a 10 pound weight gain over the past several days.  He has a history of atrial tachycardia, cocaine abuse, congestive heart failure.  He states he's been compliant with his medications.  He denies recent cocaine use.  No fevers or chills.  No cough or congestion.  His symptoms are mild to moderate in severity.  He denies unilateral leg swelling.  He does report some dyspnea on exertion and some orthopnea.   Past Medical History  Diagnosis Date  . Hypertension   . Bipolar disorder   . Schizophrenia     h/o hospitalized inpt therapy @ the [facility]  . Valvular heart disease     a. 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 b. 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  Marland Kitchen Hyperlipidemia   . H/O: suicide attempt 1998    was hospitalized at [facility]  . Anemia     Hx of iron infusions  . Gastroparesis   . Arthritis   . Cocaine abuse     per pt none x 3 yrs however continues to "handle" it and deal it with + UDS.  Marland Kitchen Anxiety   . CHF (congestive heart failure)     a. EF 25-30% by echo 12/02/12, but 55% by TEE 12/04/12.  Marland Kitchen GERD (gastroesophageal reflux disease)   . Depression   . Vertigo   . Itchy skin   . Peripheral neuropathy     BLE  . Bilateral lower abdominal cramping   . Shortness of breath     "periodically; happens at any time" (03/30/13)  . Generalized headaches     "monthly" (03/30/2013)  . Chronic kidney disease     has fistula, not on HD; "go to [facility] q 90 days to have kidneys checked out" (03/30/2013)  . Atrial tachycardia     a. dx 11/2012: tx with amiodarone, cardioversion (discharge  rhythm: likely junctional).   Past Surgical History  Procedure Laterality Date  . Mitral valve repair  05/2010  . Av fistula placement, radiocephalic Left 05/24/2011    arm; "aien't never had to use it" (03/30/2013)  . Laceration repair Right ~ 1979  . Umbilical hernia repair  04/01/2012    Procedure: HERNIA REPAIR UMBILICAL ADULT;  Surgeon: Ardeth Sportsman, MD;  Location: [facility] OR;  Service: General;  Laterality: N/A;  supra umbilical  . Tee without cardioversion  08/25/2012    Procedure: TRANSESOPHAGEAL ECHOCARDIOGRAM (TEE);  Surgeon: Wendall Stade, MD;  Location: [facility] ENDOSCOPY;  Service: Cardiovascular;  Laterality: N/A;  . Mitral valve replacement  09/23/2012    Procedure: REDO MITRAL VALVE REPLACEMENT (MVR);  Surgeon: [name], MD;  Location: [facility] OR;  Service: Open Heart Surgery;  Laterality: N/A;  Nitric Oxide  . Tricuspid valve replacement  09/23/2012    Procedure: TRICUSPID VALVE REPAIR;  Surgeon: [name], MD;  Location: [facility] OR;  Service: Open Heart Surgery;  Laterality: N/A;  . Tee without cardioversion  12/04/2012    Procedure: TRANSESOPHAGEAL ECHOCARDIOGRAM (TEE);  Surgeon: Laurey Morale, MD;  Location: [facility] ENDOSCOPY;  Service: Cardiovascular;  Laterality: N/A;  CV 1015/vicki, dl  . Cardioversion  01/17/6009  Procedure: CARDIOVERSION;  Surgeon: Laurey Morale, MD;  Location: [facility] ENDOSCOPY;  Service: Cardiovascular;  Laterality: N/A;  . Cardiac valve replacement      mitral  . Cardiac catheterization  05/26/2010    Hattie Perch 03/30/2013   Family History  Problem Relation Age of Onset  . Hypertension Mother   . Heart disease Mother   . Heart disease Brother    History  Substance Use Topics  . Smoking status: Former Smoker -- 1.50 packs/day for 30 years    Types: Cigarettes    Quit date: 03/21/2011  . Smokeless tobacco: Never Used  . Alcohol Use: No     Comment: Quit alcohol in July of 2011    Review of Systems  All other systems reviewed and are  negative.    Allergies  Cashew nut oil and Food  Home Medications   Current Outpatient Rx  Name  Route  Sig  Dispense  Refill  . acetaminophen (TYLENOL) 325 MG tablet   Oral   Take 650 mg by mouth every 6 (six) hours as needed for pain.         Marland Kitchen albuterol (PROVENTIL HFA;VENTOLIN HFA) 108 (90 BASE) MCG/ACT inhaler   Inhalation   Inhale 2 puffs into the lungs every 6 (six) hours as needed for wheezing.         Marland Kitchen amLODipine (NORVASC) 10 MG tablet   Oral   Take 10 mg by mouth daily.         . ARIPiprazole (ABILIFY) 5 MG tablet   Oral   Take 5 mg by mouth daily.          Marland Kitchen aspirin EC 81 MG tablet   Oral   Take 81 mg by mouth daily.         . carvedilol (COREG) 25 MG tablet   Oral   Take 25 mg by mouth 2 (two) times daily with a meal.         . furosemide (LASIX) 40 MG tablet   Oral   Take 40 mg by mouth 2 (two) times daily.         . hydrALAZINE (APRESOLINE) 100 MG tablet   Oral   Take 100 mg by mouth 2 (two) times daily.         . metoCLOPramide (REGLAN) 10 MG tablet   Oral   Take 1 tablet (10 mg total) by mouth 4 (four) times daily.   30 tablet   3   . omeprazole (PRILOSEC) 40 MG capsule   Oral   Take 40 mg by mouth 2 (two) times daily as needed (heartburn).          BP 143/105  Pulse 124  Temp(Src) 97.4 F (36.3 C) (Oral)  Resp 24  SpO2 98% Physical Exam  Nursing note and vitals reviewed. Constitutional: He is oriented to person, place, and time. He appears well-developed and well-nourished.  HENT:  Head: Normocephalic and atraumatic.  Eyes: EOM are normal.  Neck: Normal range of motion.  Cardiovascular: Normal rate, regular rhythm, normal heart sounds and intact distal pulses.   Pulmonary/Chest: Effort normal. No respiratory distress. He has rales.  Abdominal: Soft. He exhibits no distension. There is no tenderness.  Musculoskeletal: Normal range of motion. He exhibits no edema.  Neurological: He is alert and oriented to person,  place, and time.  Skin: Skin is warm and dry.  Psychiatric: He has a normal mood and affect. Judgment normal.    ED Course  Procedures (  including critical care time) Labs Review Labs Reviewed  CBC WITH DIFFERENTIAL - Abnormal; Notable for the following:    Hemoglobin 12.1 (*)    HCT 35.4 (*)    RDW 19.5 (*)    Platelets 119 (*)    All other components within normal limits  COMPREHENSIVE METABOLIC PANEL - Abnormal; Notable for the following:    Glucose, Bld 103 (*)    BUN 53 (*)    Creatinine, Ser 3.43 (*)    Albumin 3.2 (*)    Alkaline Phosphatase 215 (*)    GFR calc non Af Amer 19 (*)    GFR calc Af Amer 22 (*)    All other components within normal limits  URINE RAPID DRUG SCREEN (HOSP PERFORMED) - Abnormal; Notable for the following:    Cocaine POSITIVE (*)    All other components within normal limits  PRO B NATRIURETIC PEPTIDE - Abnormal; Notable for the following:    Pro B Natriuretic peptide (BNP) 9651.0 (*)    All other components within normal limits  POCT I-STAT TROPONIN I - Abnormal; Notable for the following:    Troponin i, poc 0.13 (*)    All other components within normal limits  TROPONIN I  TROPONIN I   Imaging Review No results found.  EKG Interpretation    Date/Time:  Friday November 20 2013 04:05:01 EST Ventricular Rate:  122 PR Interval:  94 QRS Duration: 93 QT Interval:  337 QTC Calculation: 480 R Axis:   73 Text Interpretation:  Sinus or ectopic atrial tachycardia RSR' in V1 or V2, probably normal variant LVH with secondary repolarization abnormality Borderline prolonged QT interval Baseline wander in lead(s) V2 No significant change was found Confirmed by Cydni Reddoch  MD, Osias Resnick ([id number]) on 11/20/2013 5:02:04 AM            MDM   1. CHF exacerbation   2. Atrial tachycardia    This appears to be likely CHF exacerbation.  This is likely complicated by his ongoing cocaine abuse.  His EKG is likely an ectopic atrial tachycardia.  Patient restarted on  a Cardizem drip.  [place] in [place].  IV Lasix given.  Troponin x2 is negative.  Aspirin given.  No longer with chest pain.  Spot to LAD.    Lyanne CoKevin M Raveena Hebdon, MD 11/20/13 [phone]

## 2013-11-20 NOTE — ED Notes (Signed)
Ordered Reg diet 

## 2013-11-21 ENCOUNTER — Observation Stay (HOSPITAL_COMMUNITY): Payer: Medicaid Other

## 2013-11-21 DIAGNOSIS — F209 Schizophrenia, unspecified: Secondary | ICD-10-CM

## 2013-11-21 DIAGNOSIS — K219 Gastro-esophageal reflux disease without esophagitis: Secondary | ICD-10-CM

## 2013-11-21 LAB — BASIC METABOLIC PANEL
BUN: 55 mg/dL — AB (ref 6–23)
CHLORIDE: 102 meq/L (ref 96–112)
CO2: 18 mEq/L — ABNORMAL LOW (ref 19–32)
Calcium: 8.1 mg/dL — ABNORMAL LOW (ref 8.4–10.5)
Creatinine, Ser: 3.27 mg/dL — ABNORMAL HIGH (ref 0.50–1.35)
GFR calc Af Amer: 23 mL/min — ABNORMAL LOW (ref >90)
GFR, EST NON AFRICAN AMERICAN: 20 mL/min — AB (ref >90)
Glucose, Bld: 138 mg/dL — ABNORMAL HIGH (ref 70–99)
Potassium: 4.1 mEq/L (ref 3.7–5.3)
Sodium: 135 mEq/L — ABNORMAL LOW (ref 137–147)

## 2013-11-21 LAB — TROPONIN I

## 2013-11-21 MED ORDER — DILTIAZEM HCL ER 60 MG PO CP12
120.0000 mg | ORAL_CAPSULE | Freq: Two times a day (BID) | ORAL | Status: DC
  Administered 2013-11-21 – 2013-11-22 (×3): 120 mg via ORAL
  Filled 2013-11-21 (×6): qty 2

## 2013-11-21 NOTE — Progress Notes (Signed)
Pt's HR sustaining in the 120's at rest. Pt denies any CP or SOB. V/S stable. EKG obtained. Md on call made aware. Will cont to monitor pt.

## 2013-11-21 NOTE — Progress Notes (Signed)
Utilization Review completed.  

## 2013-11-21 NOTE — Progress Notes (Signed)
Subjective:  Pt seen and examined in AM. No acute events overnight. Pt feels well and denies chest pain, dyspnea, palpitations, lightheadedness, diaphoresis, nausea, vomiting, abdominal pain, or change in BM or urination.    Objective: Vital signs in last 24 hours: Filed Vitals:   11/21/13 0835 11/21/13 0902 11/21/13 1206 11/21/13 1729  BP: 120/84 117/75 103/63 100/60  Pulse: 121 121 112   Temp:  97.6 F (36.4 C) 97.6 F (36.4 C)   TempSrc:  Oral Oral   Resp:  20 19   Height:      Weight:      SpO2:  97% 96%    Weight change:   Intake/Output Summary (Last 24 hours) at 11/21/13 1757 Last data filed at 11/21/13 1523  Gross per 24 hour  Intake      0 ml  Output    150 ml  Net   -150 ml    General: Well-developed, well-nourished Head: Normocephalic, atraumatic. Eyes: EOMI Neck: supple  Lungs: Normal respiratory effort. Clear to auscultation bilaterally from apices to bases without crackles or wheezes appreciated.  Heart: Normal regular rhythm, normal S1 and S2, + murmur 2/6 Abdomen: BS normoactive. Soft, Nondistended, non-tender. No masses or organomegaly appreciated. Extremities: No LE edema, distal pulses intact Neurologic: alert and oriented x3  Lab Results: Basic Metabolic Panel:  Recent Labs Lab 11/20/13 0425 11/21/13 0624  NA 137 135*  K 4.4 4.1  CL 103 102  CO2 19 18*  GLUCOSE 103* 138*  BUN 53* 55*  CREATININE 3.43* 3.27*  CALCIUM 8.6 8.1*   Liver Function Tests:  Recent Labs Lab 11/20/13 0425  AST 36  ALT 33  ALKPHOS 215*  BILITOT 0.9  PROT 7.5  ALBUMIN 3.2*   No results found for this basename: LIPASE, AMYLASE,  in the last 168 hours No results found for this basename: AMMONIA,  in the last 168 hours CBC:  Recent Labs Lab 11/20/13 0425  WBC 5.6  NEUTROABS 2.7  HGB 12.1*  HCT 35.4*  MCV 79.9  PLT 119*   Cardiac Enzymes:  Recent Labs Lab 11/20/13 0722 11/20/13 1215 11/20/13 1632  TROPONINI <0.30 <0.30 <0.30    BNP:  Recent Labs Lab 11/20/13 0505  PROBNP 9651.0*   D-Dimer: No results found for this basename: DDIMER,  in the last 168 hours CBG: No results found for this basename: GLUCAP,  in the last 168 hours Hemoglobin A1C: No results found for this basename: HGBA1C,  in the last 168 hours Fasting Lipid Panel: No results found for this basename: CHOL, HDL, LDLCALC, TRIG, CHOLHDL, LDLDIRECT,  in the last 168 hours Thyroid Function Tests: No results found for this basename: TSH, T4TOTAL, FREET4, T3FREE, THYROIDAB,  in the last 168 hours Coagulation:  Recent Labs Lab 11/20/13 1215  LABPROT 15.1  INR 1.22   Anemia Panel: No results found for this basename: VITAMINB12, FOLATE, FERRITIN, TIBC, IRON, RETICCTPCT,  in the last 168 hours Urine Drug Screen: Drugs of Abuse     Component Value Date/Time   LABOPIA NONE DETECTED 11/20/2013 0505   LABOPIA NEGATIVE 05/10/2012 0433   COCAINSCRNUR POSITIVE* 11/20/2013 0505   COCAINSCRNUR POSITIVE* 05/10/2012 0433   LABBENZ NONE DETECTED 11/20/2013 0505   LABBENZ NEGATIVE 05/10/2012 0433   AMPHETMU NONE DETECTED 11/20/2013 0505   AMPHETMU NEGATIVE 05/10/2012 0433   THCU NONE DETECTED 11/20/2013 0505   LABBARB NONE DETECTED 11/20/2013 0505    Alcohol Level: No results found for this basename: ETH,  in the last 168  hours Urinalysis: No results found for this basename: COLORURINE, APPERANCEUR, LABSPEC, PHURINE, GLUCOSEU, HGBUR, BILIRUBINUR, KETONESUR, PROTEINUR, UROBILINOGEN, NITRITE, LEUKOCYTESUR,  in the last 168 hours  Micro Results: No results found for this or any previous visit (from the past 240 hour(s)). Studies/Results: Dg Chest 2 View  11/21/2013   CLINICAL DATA:  Left-sided chest pain  EXAM: CHEST  2 VIEW  COMPARISON:  11/20/2013  FINDINGS: Bilateral diffuse interstitial thickening and peribronchial cuffing most concerning for bronchitis. There is a trace left pleural effusion. Is noted and a right pleural effusion. There is no pneumothorax.  Stable cardiomegaly with mitral valve and tricuspid valve replacement. The osseous structures are unremarkable.  IMPRESSION: Bilateral diffuse interstitial thickening and peribronchial cuffing most concerning for bronchitis.   Electronically Signed   By: Elige Ko   On: 11/21/2013 11:58   Dg Chest Portable 1 View  11/20/2013   CLINICAL DATA:  Chest pain, shortness of breath  EXAM: PORTABLE CHEST - 1 VIEW  COMPARISON:  09/13/2013.  FINDINGS: Low lung volumes. Cardiac silhouette is enlarged. Patient status post median sternotomy mitral and aortic valve replacement. Minimal areas increased density projects within the lung bases. No focal regions of consolidation identified. Osseous structures unremarkable.  IMPRESSION: Atelectasis versus early or mild infiltrates in the lung bases. No focal regions of consolidation. With technique taking into consideration stable cardiomegaly.   Electronically Signed   By: Salome Holmes M.D.   On: 11/20/2013 09:05   Medications: I have reviewed the patient's current medications. Scheduled Meds: . ARIPiprazole  5 mg Oral Daily  . aspirin EC  81 mg Oral Daily  . carvedilol  25 mg Oral BID WC  . diltiazem  120 mg Oral Q12H  . furosemide  40 mg Oral BID  . heparin  5,000 Units Subcutaneous Q8H  . hydrALAZINE  100 mg Oral BID  . metoCLOPramide  5 mg Oral QID  . pantoprazole  40 mg Oral Daily  . sodium chloride  3 mL Intravenous Q12H  . sodium chloride  3 mL Intravenous Q12H   Continuous Infusions:  PRN Meds:.sodium chloride, acetaminophen, albuterol, hydrocortisone cream, nitroGLYCERIN, sodium chloride Assessment/Plan: Principal Problem:   Chest pain Active Problems:   BIPOLAR AFFECTIVE DISORDER   Hypertension   GERD (gastroesophageal reflux disease)   Gastroparesis   Schizophrenia   Atrial ectopic tachycardia  Chest Pain in setting of  ectopic atrial tachycardia  - CP free,  Most likely due to demand ischemia in setting of  ectopic atrial rhythm and  possible cocaine induced vasospasm with HR 120-130 on admission. CE's within normal limit. Cardiac catheter on 08/28/12 revealed non-obstructive disease in left mainstem and minimal nonobstructive plaque in left anterior descending (LAD). -Repeat CXR - 2 view without cardiopulmonary disease  -Start PO diltiazem 120 mg BID  -Continue home carvedilol 25 mg BD  -Continue hydralazine 100 mg BID -Nitroglycerine PRN -Aspirin 81 mg daily   Congestive Heart Failure - 2D-echo on 03/31/14 with normal systolic function, EF 50-55%. Weight stable without fluid overload on exam, however pro-BNP elevated at 9651 and pulmonary vascular congestion on CXR suggesting possible mild CHF exacerbation..  -Contine home lasix 40 mg BID -Continue home carvedilol 25 mg BD  -Monitor daily weight, I&Os  Hypertension - currently normotensive -Hold home amlodipine 10 mg daily -Continue diltiazem  120 mg BID  -Continue home hydralazine 100 mg tBID.  -Continue home lasix 40 mg BID -Continue home carvedilol 25 mg BID   CKD Stage 4 - baseline Cr 2.7-3.7.Pt with  A-V fistula on the left forearm which is functioning.  -Continue to monitor    Mood disorder - Pt with schizophrenia and bipolar disorder -Continue home abilify 5 mg daily  GERD - currently without acid reflux symptoms -Continue protonix 40 mg daily  Cocaine Abuse - Pt denied drug use but positive for cocaine on UDS   -Encourage cessation   Diet: Heart healthy DVT Ppx: SQ Heparin TID Code: Full   Dispo: Disposition is deferred at this time, awaiting improvement of current medical problems.  Anticipated discharge in approximately 1 day.   The patient does have a current PCP Pleas Koch, MD) and does need an [facility] hospital follow-up appointment after discharge.  The patient does not have transportation limitations that hinder transportation to clinic appointments.  .Services Needed at time of discharge: Y = Yes, Blank = No PT:   OT:   RN:    Equipment:   Other:     LOS: 1 day   Otis Brace, MD 11/21/2013, 5:57 PM

## 2013-11-21 NOTE — H&P (Signed)
Internal Medicine Attending Admission Note Date: 11/21/2013  Patient name: Ryan Winters Medical record number: [id number] Date of birth: [birth year] Age: 53 y.o. Gender: male  I saw and evaluated the patient. I reviewed the resident's note and I agree with the resident's findings and plan as documented in the resident's note.  Ryan Winters is a [age] year old man with a past medical history of stage IV chronic renal failure, diastolic heart failure, history of ectopic atrial arrhythmia status post cardioversion, tricuspid and mitral valve repair and replacement respectively, and cocaine abuse who presents with chest pain. He was in his usual state of health until 3 days prior to admission, when helping his mother cook, he developed left-sided chest pain associated with tachycardia, palpitations, and radiation to the left arm. The pain resolved spontaneously after 5 minutes and he did well until the morning of admission when he was awoken by a similar left-sided chest pain, also accompanied by palpitations and tachycardia. He called 911 and was taken to the emergency department where he was found to have ectopic atrial tachycardia. He was started on a Cardizem drip and admitted to the internal medicine teaching service for further evaluation and care. He denied any fevers, shakes, chills, shortness of breath, or abdominal pain. He also denied cocaine use but did have a positive UDS for cocaine.  Overnight he converted back to normal sinus rhythm but then reverted back to an atrial tachycardia necessitating reinstitution of the Cardizem drip. Despite Coreg at 25 mg twice a day and Cardizem at 120 mg twice a day his tachycardia persisted. He has ruled out for a myocardial infarction with serial enzymes. Examination this morning reveals a regular tachycardia without any pulmonary crackles or wheezes.  I am concerned that Ryan Winters's chest pain may have been a type of demand ischemia associated with his  tachycardia. This was likely precipitated by cocaine use. He may have also had cocaine associated vasospasm that further exacerbated his demand ischemia. We will continue both the Coreg and the oral Cardizem and monitor his heart rate today on telemetry. I suspect when the Cardizem achieve steady state we should have better control of his tachycardia and thus his chest pain. Because he will be discharged on dual nodal agents he will require quick followup in the Internal Medicine Center to make sure that he does not develop a bradycardia or require further adjustments in his diltiazem dosing. The only other issue currently in the air has to do with a possible left-sided infiltrate seen on a portable x-ray. Given the technique, this is difficult to confirm with any assurance and he will therefore be sent for a PA and lateral chest x-ray to better assess for a left-sided infiltrate. This is important as it may also have been a precipitator of his chest pain and tachycardia, if it is present, and will affect our management significantly. I am hopeful we can get control of his heart rate over the next 24 hours and address the possible pulmonary issue so that he may be ready for discharge within the next 24-48 hours.

## 2013-11-22 DIAGNOSIS — I5042 Chronic combined systolic (congestive) and diastolic (congestive) heart failure: Secondary | ICD-10-CM

## 2013-11-22 MED ORDER — HYDROCORTISONE 1 % EX CREA: TOPICAL_CREAM | CUTANEOUS | Status: DC | PRN

## 2013-11-22 MED ORDER — DILTIAZEM HCL ER 120 MG PO CP12: 120.0000 mg | ORAL_CAPSULE | Freq: Two times a day (BID) | ORAL | Status: DC

## 2013-11-22 NOTE — Discharge Instructions (Signed)
Thank you for letting us take care of you this hospital admission  Please stop using any illicit drugs and stop smoking  Please continue your heart rate medications: Diltiazem 120mg  twice a day and Coreg 25mg  twice a day as well  You will need to follow up in our clinic within a week after discharge, please call and make an appointment and we will also have the clinic call you to establish a follow up appiontment  If you have any chest pain, change in your heart rhythm, shortness of breath, dizziness, slow heart rate, very fast heart rate, nausea or vomiting, fainting, or any other symptoms please call us right away [phone] or go ED   Chest Pain (Nonspecific) Chest pain has many causes. Your pain could be caused by something serious, such as a heart attack or a blood clot in the lungs. It could also be caused by something less serious, such as a chest bruise or a virus. Follow up with your doctor. More lab tests or other studies may be needed to find the cause of your pain. Most of the time, nonspecific chest pain will improve within 2 to 3 days of rest and mild pain medicine. HOME CARE  For chest bruises, you may put ice on the sore area for 15-20 minutes, 3-4 times a day. Do this only if it makes you or your child feel better.  Put ice in a plastic bag.  Place a towel between the skin and the bag.  Rest for the next 2 to 3 days.  Go back to work if the pain improves.  See your doctor if the pain lasts longer than 1 to 2 weeks.  Only take medicine as told by your doctor.  Quit smoking if you smoke. GET HELP RIGHT AWAY IF:   There is more pain or pain that spreads to the arm, neck, jaw, back, or belly (abdomen).  You or your child has shortness of breath.  You or your child coughs more than usual or coughs up blood.  You or your child has very bad back or belly pain, feels sick to his or her stomach (nauseous), or throws up (vomits).  You or your child has very bad  weakness.  You or your child passes out (faints).  You or your child has a temperature by mouth above 102 F (38.9 C), not controlled by medicine. Any of these problems may be serious and may be an emergency. Do not wait to see if the problems will go away. Get medical help right away. Call your local emergency services 911 in [place] Do not drive yourself to the hospital. MAKE SURE YOU:   Understand these instructions.  Will watch this condition.  Will get help right away if you or your child is not doing well or gets worse. Document Released: 04/23/2008 Document Revised: 01/28/2012 Document Reviewed: 04/23/2008 [facility] Patient Information 2014 [employer], [place].

## 2013-11-22 NOTE — Discharge Summary (Signed)
Name: Ryan Winters MRN: [id number] DOB: 1961-02-03 52 y.o. PCP: Pleas Koch, MD  Date of Admission: 11/20/2013  3:57 AM Date of Discharge: 11/22/2013 Attending Physician: Dr. Josem Kaufmann Discharge Diagnosis: Principal Problem:   Chest pain in setting of atrial trachycardia Active Problems:   Hypertension   Schizophrenia   BIPOLAR AFFECTIVE DISORDER   GERD (gastroesophageal reflux disease)   Gastroparesis   SCHIZOPHRENIA   CKD (chronic kidney disease), stage IV   Chronic combined systolic and diastolic CHF (congestive heart failure)   Cocaine abuse  Discharge Medications:   Medication List    STOP taking these medications       amLODipine 10 MG tablet  Commonly known as:  NORVASC      TAKE these medications       ABILIFY 5 MG tablet  Generic drug:  ARIPiprazole  Take 5 mg by mouth daily.     acetaminophen 325 MG tablet  Commonly known as:  TYLENOL  Take 650 mg by mouth every 6 (six) hours as needed for pain.     albuterol 108 (90 BASE) MCG/ACT inhaler  Commonly known as:  PROVENTIL HFA;VENTOLIN HFA  Inhale 2 puffs into the lungs every 6 (six) hours as needed for wheezing.     aspirin EC 81 MG tablet  Take 81 mg by mouth daily.     carvedilol 25 MG tablet  Commonly known as:  COREG  Take 25 mg by mouth 2 (two) times daily with a meal.     diltiazem 120 MG 12 hr capsule  Commonly known as:  CARDIZEM SR  Take 1 capsule (120 mg total) by mouth every 12 (twelve) hours.     furosemide 40 MG tablet  Commonly known as:  LASIX  Take 40 mg by mouth 2 (two) times daily.     hydrALAZINE 100 MG tablet  Commonly known as:  APRESOLINE  Take 100 mg by mouth 2 (two) times daily.     hydrocortisone cream 1 %  Apply topically as needed for itching.     metoCLOPramide 10 MG tablet  Commonly known as:  REGLAN  Take 1 tablet (10 mg total) by mouth 4 (four) times daily.     omeprazole 40 MG capsule  Commonly known as:  PRILOSEC  Take 40 mg by mouth 2 (two) times  daily as needed (heartburn).       Disposition and follow-up:   [name] was discharged from [facility] in stable condition.  At the hospital follow up visit please address:  HR: noted to have atrial tachycardia during admission but HR improved with Cardizem and Coreg. Please monitor HR in case of bradycardia after steady state of medications achieved and adjust accordingly.   BP: monitor for possible hypotension given BB, CCB, and Hydralazine along with lasix. Tolerated well during admission but may need to adjust doses as outpatient.   Substance abuse: cessation from cocaine strongly encouraged and smoking cessation as well.   CKD stage 4--Cr 3.27 on 1/3, around baseline.  Follow up with Dr. Arrie Aran.   2.  Labs / imaging needed at time of follow-up: monitoring of renal function, has not started HD yet.   3.  Pending labs/ test needing follow-up: none  Follow-up Appointments:     Follow-up Information   Schedule an appointment as soon as possible for a visit with Pleas Koch, MD.   Specialty:  Internal Medicine   Contact information:   [address] [place] [address] [phone]  Discharge Instructions: Discharge Orders   Future Appointments Provider Department Dept Phone   12/02/2013 10:15 AM Pleas Koch, MD [facility] [phone]   Future Orders Complete By Expires   Call MD for:  difficulty breathing, headache or visual disturbances  As directed    Call MD for:  persistant dizziness or light-headedness  As directed    Call MD for:  severe uncontrolled pain  As directed    Call MD for:  temperature >100.4  As directed    Diet - low sodium heart healthy  As directed    Increase activity slowly  As directed      Procedures Performed:  Dg Chest 2 View  11/21/2013   CLINICAL DATA:  Left-sided chest pain  EXAM: CHEST  2 VIEW  COMPARISON:  11/20/2013  FINDINGS: Bilateral diffuse  interstitial thickening and peribronchial cuffing most concerning for bronchitis. There is a trace left pleural effusion. Is noted and a right pleural effusion. There is no pneumothorax. Stable cardiomegaly with mitral valve and tricuspid valve replacement. The osseous structures are unremarkable.  IMPRESSION: Bilateral diffuse interstitial thickening and peribronchial cuffing most concerning for bronchitis.   Electronically Signed   By: Elige Ko   On: 11/21/2013 11:58   Dg Chest Portable 1 View  11/20/2013   CLINICAL DATA:  Chest pain, shortness of breath  EXAM: PORTABLE CHEST - 1 VIEW  COMPARISON:  09/13/2013.  FINDINGS: Low lung volumes. Cardiac silhouette is enlarged. Patient status post median sternotomy mitral and aortic valve replacement. Minimal areas increased density projects within the lung bases. No focal regions of consolidation identified. Osseous structures unremarkable.  IMPRESSION: Atelectasis versus early or mild infiltrates in the lung bases. No focal regions of consolidation. With technique taking into consideration stable cardiomegaly.   Electronically Signed   By: Salome Holmes M.D.   On: 11/20/2013 09:05   2D Echo: Transthoracic Echocardiography  [name], Ryan Winters MR #: [id number] Study Date: 03/31/2013 Gender: M Age: 52 Height: 165.1cm Weight: 71.6kg BSA: 1.12m^2 Pt. Status: Room: A09C  ADMITTING Willa Rough ATTENDING Willa Rough PERFORMING [place], [facility] SONOGRAPHER [facility], RDCS Raeanne Gathers cc:  ------------------------------------------------------------ LV EF: 50% - 55%  ------------------------------------------------------------ Indications: Dyspnea 786.09.  ------------------------------------------------------------ History: PMH: Chest pain. Congestive heart failure. Risk factors: Hypertension.  ------------------------------------------------------------ Study Conclusions  - Left ventricle: The cavity size was  normal. Wall thickness was increased in a pattern of moderate LVH. Systolic function was normal. The estimated ejection fraction was in the range of 50% to 55%. Wall motion was normal; there were no regional wall motion abnormalities. - Mitral valve: A bioprosthesis was present and functioning normally. Mild regurgitation. - Left atrium: The atrium was mildly to moderately dilated. - Right ventricle: The cavity size was mildly dilated. - Right atrium: The atrium was mildly dilated. - Tricuspid valve: Prior procedures included surgical repair. - Pulmonary arteries: PA peak pressure: 46mm Hg (S).  ------------------------------------------------------------ Labs, prior tests, procedures, and surgery: (November 2011). Mitral valve replacement with a bioprosthetic valve. Tricuspid valve repair. Transthoracic echocardiography. M-mode, complete 2D, spectral Doppler, and color Doppler. Height: Height: 165.1cm. Height: 65in. Weight: Weight: 71.6kg. Weight: 157.5lb. Body mass index: BMI: 26.3kg/m^2. Body surface area: BSA: 1.40m^2. Blood pressure: 107/63. Patient status: Inpatient. Location: Echo laboratory.  ------------------------------------------------------------  ------------------------------------------------------------ Left ventricle: The cavity size was normal. Wall thickness was increased in a pattern of moderate LVH. Systolic function was normal. The estimated ejection fraction was in the range of 50% to 55%. Wall motion was normal;  there were no regional wall motion abnormalities.  ------------------------------------------------------------ Aortic valve: Structurally normal valve. Trileaflet. Cusp separation was normal. Doppler: Transvalvular velocity was within the normal range. There was no stenosis. No regurgitation.  ------------------------------------------------------------ Aorta: The aorta was normal, not dilated, and  non-diseased.  ------------------------------------------------------------ Mitral valve: A bioprosthesis was present and functioning normally. Doppler: Mild regurgitation. Valve area by pressure half-time: 2.56cm^2. Indexed valve area by pressure half-time: 1.4cm^2/m^2. Mean gradient: 7mm Hg (D). Peak gradient: 25mm Hg (D).  ------------------------------------------------------------ Left atrium: The atrium was mildly to moderately dilated.  ------------------------------------------------------------ Right ventricle: The cavity size was mildly dilated. Systolic function was normal.  ------------------------------------------------------------ Pulmonic valve: Structurally normal valve. Cusp separation was normal. Doppler: Transvalvular velocity was within the normal range. No regurgitation.  ------------------------------------------------------------ Tricuspid valve: Prior procedures included surgical repair. Leaflet separation was normal. Doppler: Transvalvular velocity was within the normal range. Mild regurgitation. Mean gradient: 4mm Hg (D). Peak gradient: 11mm Hg (D).  ------------------------------------------------------------ Right atrium: The atrium was mildly dilated.  ------------------------------------------------------------ Pericardium: The pericardium was normal in appearance.  ------------------------------------------------------------ Post procedure conclusions Ascending Aorta:  - The aorta was normal, not dilated, and non-diseased.  ------------------------------------------------------------  2D measurements Normal Doppler measurements Norma Left ventricle l LVID ED, 47.1 mm 43-52 Main pulmonary artery chord, Pressure, 46 mm Hg =30 PLAX S LVID ES, 33.5 mm 23-38 Mitral valve chord, Mean vel, 117 cm/s ----- PLAX D FS, chord, 29 % >29 Pressure 86 ms ----- PLAX half-time LVPW, ED 16.4 mm ------ Mean 7 mm Hg ----- IVS/LVPW 1.07 <1.3  gradient, ratio, ED D Ventricular septum Peak 25 mm Hg ----- IVS, ED 17.5 mm ------ gradient, Aorta D Root diam, 29 mm ------ Area (PHT) 2.5 cm^2 ----- ED 6 Left atrium Area index 1.4 cm^2/m^2 ----- AP dim 49 mm ------ (PHT) AP dim 2.68 cm/m^2 <2.2 Annulus 81. cm ----- index VTI 4 Tricuspid valve Mean vel, 81. cm/s ----- D 7 Mean 4 mm Hg ----- gradient, D Peak 11 mm Hg ----- gradient, D Max inflow 168 cm/s ----- vel VTI at 49. 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  Patient has a history of medication noncompliance, but states that he has been taking all medications regularly. Patient reports that he had a chest pain 3 days ago when he was helping his mother in cooking food. His chest pain started suddenly. It is located at the left lower chest, just about the rib cage. It is 10 out of 10 in severity, sharp and radiating to the left arm. His chest pain resolved after he took Tylenol, aspirin and having a rest. It lasted for about 5 min. He was doing fine until this morning. At about 3:00 AM, he started having another episode of chest pain, which is very similar to the previous one. He called 911. He was brought to ED by EMS. He was given 324 mg of  ASA  by EMS. He was found to have tachycardia with heart rates between 120 to 130 in Ed. EKG showed ectopic atrial tachycardia. His body weight increased by 20 pounds per pt. He was started with Cardizem drip and given 1 L of NS in ED. He was also given one dose of Lasix 60 mg. When I saw patient in ED, his chest pain subsided to 2/10 in severity. He does not have cough. His temperature is normal. He states that his chest pain is aggravated by deep breath, which he feels like muscle spasm on the left lower chest, just above the rib cage. No recent long distance traveling. No tenderness over the calf areas. Patient denies cocaine use, but UDS is positive for cocaine.  Hospital Course by problem list:    Chest pain in setting of atrial tachycardia--resolved during admission.  Unclear etiology, possibly multifactorial including secondary to demand ischemia associated with tachycardia that was related to cocaine use and/or cocaine induced vasospasm exacerbating the demand ischemia.  While initial POC troponin was positive at 0.13, all subsequent cardiac enzymes were cardiac enzymes were negative.  As heart rate was controlled during admission, chest pain improved.  He was initially started on Cardizem drip and transitioned successfully to oral Coreg 25mg  BID and Cardizem 120mg  BID as well. He will need to be followed up closely in [facility] for possible bradycardia after his medications will achieve steady state and appropriate changes to medications will need to take place at that time.  Of note, his UDS was cocaine positive and cessation of illicit drugs and tobacco was strongly encouraged during admission.     Hypertension--stable during hospital course and will need to be monitored as an outpatient for possible hypotension given addition of Cardizem to medication list.  His amlodipine was discontinued and he was started on Cardizem 120mg  bid.  Additionally, he was continued on his Coreg 25mg  BID, Hydralazine 100mg   BID, and lasix 40mg  bid. In the event that he starts to become hypotensive with current regimen, hydralazine could be decreased to 50mg  BID.     CKD stage 4--progressive worsening over time but stable during admission relatively at baseline.  Cr 3.27 on 1/3.  Left forearm AV fistula appears to be functioning but he has not started HD yet.  He follows with Dr. Arrie Aran.    Chronic congestive heart failure with EF 50-55% on 03/2013.  Baseline weight ~165lbs and he was 162lbs day prior to discharge. Heart failure appeared stable during hospital course with no lower extremity edema or hypoxia noted during admission.  He initially received IV lasix in ED along with 1L NS and was continued on home lasix during admission that was tolerated well.  We continued his home BB and monitored his weight.     Schizophrenia and Bipolar disorder--stable during hospital course.  His Abilify was continued.     GERD (gastroesophageal reflux disease)--also stable during admission and continued on prilosec.     Gastroparesis--continued Reglan. Tolerated diet well during admission with no nausea or vomiting.    Discharge Vitals:   BP 130/80  Pulse 80  Temp(Src) 98.5 F (36.9 C) (Oral)  Resp 18  Ht 5\' 4"  (1.626 m)  Wt 162 lb 0.6 oz (73.5 kg)  BMI 27.80 kg/m2  SpO2 98%  Discharge Labs:  Basic Metabolic Panel:  Recent Labs  19/14/78 0425 11/21/13 0624  NA 137 135*  K 4.4 4.1  CL 103 102  CO2 19 18*  GLUCOSE 103* 138*  BUN 53* 55*  CREATININE 3.43* 3.27*  CALCIUM 8.6 8.1*   Liver Function Tests:  Recent Labs  11/20/13 0425  AST 36  ALT 33  ALKPHOS 215*  BILITOT 0.9  PROT 7.5  ALBUMIN 3.2*   CBC:  Recent Labs  11/20/13 0425  WBC 5.6  NEUTROABS 2.7  HGB 12.1*  HCT 35.4*  MCV 79.9  PLT 119*   Cardiac Enzymes:  Recent Labs  11/20/13 0722 11/20/13 1215 11/20/13 1632  TROPONINI <0.30 <0.30 <0.30   BNP:  Recent Labs  11/20/13 0505  PROBNP 9651.0*   Coagulation:  Recent  Labs  11/20/13 1215  LABPROT 15.1  INR 1.22   Urine Drug Screen: Drugs of Abuse     Component Value Date/Time   LABOPIA NONE DETECTED 11/20/2013 0505   LABOPIA NEGATIVE 05/10/2012 0433   COCAINSCRNUR POSITIVE* 11/20/2013 0505   COCAINSCRNUR POSITIVE* 05/10/2012 0433   LABBENZ NONE DETECTED 11/20/2013 0505   LABBENZ NEGATIVE 05/10/2012 0433   AMPHETMU NONE DETECTED 11/20/2013 0505   AMPHETMU NEGATIVE 05/10/2012 0433   THCU NONE DETECTED 11/20/2013 0505   LABBARB NONE DETECTED 11/20/2013 0505    Signed: Darden Palmer, MD 11/22/2013, 10:44 PM   Time Spent on Discharge: 40 minutes Services Ordered on Discharge: none Equipment Ordered on Discharge: none

## 2013-11-22 NOTE — Progress Notes (Signed)
Subjective:  Ryan Winters was seen and examined at bedside this morning. He reports feeling great and is ready to go home. He says he would like to leave as soon as possible to attend a church program. He denies any complaints overnight. No current chest pain, shortness of breath, or abdominal pain at this time.   Objective: Vital signs in last 24 hours: Filed Vitals:   11/21/13 1729 11/21/13 2128 11/22/13 0500 11/22/13 0755  BP: 100/60 116/68 126/88 130/80  Pulse: 58 80  80  Temp:  98.5 F (36.9 C) 98.5 F (36.9 C)   TempSrc:  Oral Oral   Resp:  18    Height:      Weight:      SpO2:  99% 98%    Weight change:   Intake/Output Summary (Last 24 hours) at 11/22/13 1145 Last data filed at 11/21/13 1523  Gross per 24 hour  Intake      0 ml  Output    150 ml  Net   -150 ml   General: Well-developed, well-nourished Head: Normocephalic, atraumatic. Eyes: EOMI Lungs: CTA B/L Heart: Regular rate, + murmur 2/6,  Abdomen: BS +. Soft, Nondistended, non-tender. No masses or organomegaly appreciated. Extremities: No LE edema Neurologic: alert and oriented x3  Lab Results: Basic Metabolic Panel:  Recent Labs Lab 11/20/13 0425 11/21/13 0624  NA 137 135*  K 4.4 4.1  CL 103 102  CO2 19 18*  GLUCOSE 103* 138*  BUN 53* 55*  CREATININE 3.43* 3.27*  CALCIUM 8.6 8.1*   Liver Function Tests:  Recent Labs Lab 11/20/13 0425  AST 36  ALT 33  ALKPHOS 215*  BILITOT 0.9  PROT 7.5  ALBUMIN 3.2*   CBC:  Recent Labs Lab 11/20/13 0425  WBC 5.6  NEUTROABS 2.7  HGB 12.1*  HCT 35.4*  MCV 79.9  PLT 119*   Cardiac Enzymes:  Recent Labs Lab 11/20/13 0722 11/20/13 1215 11/20/13 1632  TROPONINI <0.30 <0.30 <0.30   BNP:  Recent Labs Lab 11/20/13 0505  PROBNP 9651.0*   Coagulation:  Recent Labs Lab 11/20/13 1215  LABPROT 15.1  INR 1.22   Urine Drug Screen: Drugs of Abuse     Component Value Date/Time   LABOPIA NONE DETECTED 11/20/2013 0505   LABOPIA  NEGATIVE 05/10/2012 0433   COCAINSCRNUR POSITIVE* 11/20/2013 0505   COCAINSCRNUR POSITIVE* 05/10/2012 0433   LABBENZ NONE DETECTED 11/20/2013 0505   LABBENZ NEGATIVE 05/10/2012 0433   AMPHETMU NONE DETECTED 11/20/2013 0505   AMPHETMU NEGATIVE 05/10/2012 0433   THCU NONE DETECTED 11/20/2013 0505   LABBARB NONE DETECTED 11/20/2013 0505    Studies/Results: Dg Chest 2 View  11/21/2013   CLINICAL DATA:  Left-sided chest pain  EXAM: CHEST  2 VIEW  COMPARISON:  11/20/2013  FINDINGS: Bilateral diffuse interstitial thickening and peribronchial cuffing most concerning for bronchitis. There is a trace left pleural effusion. Is noted and a right pleural effusion. There is no pneumothorax. Stable cardiomegaly with mitral valve and tricuspid valve replacement. The osseous structures are unremarkable.  IMPRESSION: Bilateral diffuse interstitial thickening and peribronchial cuffing most concerning for bronchitis.   Electronically Signed   By: Elige KoHetal  Patel   On: 11/21/2013 11:58   Medications: I have reviewed the patient's current medications. Scheduled Meds: . ARIPiprazole  5 mg Oral Daily  . aspirin EC  81 mg Oral Daily  . carvedilol  25 mg Oral BID WC  . diltiazem  120 mg Oral Q12H  . furosemide  40 mg  Oral BID  . heparin  5,000 Units Subcutaneous Q8H  . hydrALAZINE  100 mg Oral BID  . metoCLOPramide  5 mg Oral QID  . pantoprazole  40 mg Oral Daily  . sodium chloride  3 mL Intravenous Q12H  . sodium chloride  3 mL Intravenous Q12H   Continuous Infusions:  PRN Meds:.sodium chloride, acetaminophen, albuterol, hydrocortisone cream, nitroGLYCERIN, sodium chloride Assessment/Plan: Principal Problem:   Chest pain Active Problems:   Hypertension   Schizophrenia   Atrial ectopic tachycardia   BIPOLAR AFFECTIVE DISORDER   GERD (gastroesophageal reflux disease)   Gastroparesis Ryan Winters is a 53 year old male with HTN and atrial ectopic tachycardia admitted for chest pain.    Chest Pain in setting of  ectopic  atrial tachycardia  - Resolved. Did admit to cocaine use prior to admission. HR controlled with continuation of home Carvedilol and addition of diltiazem 120mg  bid this admission. Repeat CXR 2 view was not suggestive of underlying infiltrate. He will need to have close [facility] follow up to monitor HR as medications wil achieve steady state by then and may need possible adjustment of medications.  -Continue PO diltiazem 120 mg BID and home carvedilol 25 mg BD  -Continue hydralazine 100 mg BID, may need to decrease to 50mg  BID if BP starts to drop -Aspirin 81 mg daily   Congestive Heart Failure - 2D-echo on 03/31/14 with normal systolic function, EF 50-55%. Weight stable during admission and down to 162lbs on 11/21/13 without fluid overload on exam. -Contine home lasix 40 mg BID -Continue home carvedilol 25 mg BD   Hypertension - currently normotensive -Discontinued amlodipine 10 mg daily -Continue diltiazem  120 mg BID  -Continue home hydralazine 100 mg BID.  -Continue home lasix 40 mg BID -Continue home carvedilol 25 mg BID   CKD Stage 4 - baseline Cr 2.7-3.7.Pt with  A-V fistula on the left forearm which is functioning.  -Continue to monitor    Mood disorder with schizophrenia and bipolar disorder -Continue home abilify 5 mg daily  GERD - stable -Continue protonix 40 mg daily  Cocaine Abuse - Pt denied drug use but positive for cocaine on UDS   -Encourage cessation   Diet: Heart healthy DVT Ppx: SQ Heparin TID Code: Full   Dispo: d/c home today   The patient does have a current PCP Pleas Koch, MD) and does need an [facility] hospital follow-up appointment after discharge.  The patient does not have transportation limitations that hinder transportation to clinic appointments.  Services Needed at time of discharge: Y = Yes, Blank = No PT:   OT:   RN:   Equipment:   Other:     LOS: 2 days   Ryan Palmer, MD 11/22/2013, 11:45 AM

## 2013-11-22 NOTE — Progress Notes (Signed)
Utilization Review completed.  

## 2013-11-24 ENCOUNTER — Ambulatory Visit: Payer: Self-pay | Admitting: Internal Medicine

## 2013-11-24 ENCOUNTER — Telehealth: Payer: Self-pay | Admitting: Internal Medicine

## 2013-11-24 NOTE — Telephone Encounter (Signed)
Opened in error

## 2013-11-27 ENCOUNTER — Ambulatory Visit (INDEPENDENT_AMBULATORY_CARE_PROVIDER_SITE_OTHER): Payer: Medicaid Other | Admitting: Internal Medicine

## 2013-11-27 ENCOUNTER — Ambulatory Visit (HOSPITAL_COMMUNITY)
Admission: RE | Admit: 2013-11-27 | Discharge: 2013-11-27 | Disposition: A | Discharge status: Home / Self Care | Payer: Medicaid Other | Source: Ambulatory Visit | Attending: Obstetrics and Gynecology | Admitting: Obstetrics and Gynecology

## 2013-11-27 ENCOUNTER — Encounter: Payer: Self-pay | Admitting: Licensed Clinical Social Worker

## 2013-11-27 ENCOUNTER — Telehealth: Payer: Self-pay | Admitting: Nurse Practitioner

## 2013-11-27 ENCOUNTER — Encounter: Payer: Self-pay | Admitting: Internal Medicine

## 2013-11-27 VITALS — BP 117/79 | HR 120 | Temp 96.7°F | Ht 66.0 in | Wt 161.0 lb

## 2013-11-27 DIAGNOSIS — Z598 Other problems related to housing and economic circumstances: Secondary | ICD-10-CM

## 2013-11-27 DIAGNOSIS — R9431 Abnormal electrocardiogram [ECG] [EKG]: Secondary | ICD-10-CM | POA: Insufficient documentation

## 2013-11-27 DIAGNOSIS — Z5987 Material hardship: Secondary | ICD-10-CM

## 2013-11-27 DIAGNOSIS — I471 Supraventricular tachycardia: Secondary | ICD-10-CM

## 2013-11-27 DIAGNOSIS — R000 Tachycardia, unspecified: Secondary | ICD-10-CM | POA: Insufficient documentation

## 2013-11-27 DIAGNOSIS — F1410 Cocaine abuse, uncomplicated: Secondary | ICD-10-CM

## 2013-11-27 DIAGNOSIS — Z599 Problem related to housing and economic circumstances, unspecified: Secondary | ICD-10-CM

## 2013-11-27 DIAGNOSIS — I10 Essential (primary) hypertension: Secondary | ICD-10-CM

## 2013-11-27 DIAGNOSIS — Z09 Encounter for follow-up examination after completed treatment for conditions other than malignant neoplasm: Secondary | ICD-10-CM

## 2013-11-27 DIAGNOSIS — I498 Other specified cardiac arrhythmias: Secondary | ICD-10-CM

## 2013-11-27 LAB — BASIC METABOLIC PANEL
BUN: 49 mg/dL — AB (ref 6–23)
CHLORIDE: 101 meq/L (ref 96–112)
CO2: 24 meq/L (ref 19–32)
CREATININE: 2.7 mg/dL — AB (ref 0.50–1.35)
Calcium: 8.8 mg/dL (ref 8.4–10.5)
Glucose, Bld: 96 mg/dL (ref 70–99)
POTASSIUM: 4.1 meq/L (ref 3.5–5.3)
Sodium: 138 mEq/L (ref 135–145)

## 2013-11-27 MED ORDER — DILTIAZEM HCL ER 120 MG PO CP12: 240.0000 mg | ORAL_CAPSULE | Freq: Two times a day (BID) | ORAL | Status: DC

## 2013-11-27 NOTE — Telephone Encounter (Signed)
Phone call today from Dr. Burtis JunesSadek today.   Ryan Winters is in her office today. HR remains 120. Asking for recommendations in regards to medicines.  Advised to increase the Cardizem to 240 mg and stop the Norvasc. Continue the Coreg. She will be arranging for follow up. His drug use continues to be an issue.   I have updated Dr. Excell Seltzerooper.   Rosalio MacadamiaLori C. Deven Furia, RN, ANP-C [facility] [address] [place], [place]  [id number] [phone]

## 2013-11-27 NOTE — Progress Notes (Signed)
Ryan Winters was referred to CSW for assistance with employment resourcSibyl Winters.  Pt is currently receiving disability, medicaid and nutrition assistance.  Ryan Winters would like assistance with employment services.  Pt has been linked with Vocational Rehab in the past, but limited jobs were available at that time.  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  CSW provided [name] with information on [employer] Employment program.  Assisted [name] with contacting [employer] [facility].  Pt completed referral for Voc Rehab with CSW.  Ryan Winters also informed of the discount pricing for OTC items with Outpatient Pharmacy and location.  Pt has CSW contact information and is aware CSW is available to assist as needed.

## 2013-11-27 NOTE — Patient Instructions (Addendum)
It was nice to me today.  In terms of your heart rate I spoke to your doctor Excell SeltzerCooper and we will make the following changes. -Discontinue Norvasc or amlodipine 10 mg -Increase your diltiazem SR to 240 mg daily (means increase from your 120 to 240 which means take 2 tablets instead of the 1 tablet every day) -follow up in 1 month with your heart doctor  In terms of helping you with your job Talk to Fiji[employer] our Child psychotherapistsocial worker.   The doctor you saw today: Dr. Burtis JunesSadek  Have a happy new year.

## 2013-11-28 DIAGNOSIS — R000 Tachycardia, unspecified: Secondary | ICD-10-CM | POA: Insufficient documentation

## 2013-11-28 DIAGNOSIS — Z09 Encounter for follow-up examination after completed treatment for conditions other than malignant neoplasm: Secondary | ICD-10-CM | POA: Insufficient documentation

## 2013-11-28 NOTE — Assessment & Plan Note (Addendum)
Pt has had a long standing hx of atrial tachy with DCCV 1/14 and even period of amiodarone. Was recently admitted with atrial tachycardia managed with dilt gtt and transitioned to oral dilt and carvedilol. Complicated by mitral valve replacement. Pt d/c HR was 80 and today in clinic 120s and pt symptomatic with palpitations.   Dr. Earmon Phoenixooper's office was called to discuss pts ongoing tachycardia and review of hospital course and mutual plan was reached.  -increase diltiazem to 240mg  q day -d/c amlodipine for BP management -discussed cocaine cessation -continue carvediolol 25 BID -close f/u  -EKG was performed and showed ?a-flutter or atrial tachycardia this was reviewed PA of Dr. Excell Seltzerooper

## 2013-11-28 NOTE — Assessment & Plan Note (Signed)
Pt continues to have UDS +cocaine and this was discussed with pt in regards to health condition.  -SW services provided.

## 2013-11-28 NOTE — Assessment & Plan Note (Signed)
BP Readings from Last 3 Encounters:  11/27/13 117/79  11/22/13 130/80  10/14/13 130/70   Pt has good BP control but will d/c amlodpine given recent doubling of diltiazem for atrial tacghycardia discussed with Gernhart PA for Dr. Excell Seltzerooper.

## 2013-11-28 NOTE — Progress Notes (Signed)
   Subjective:    Patient ID: [facility], male    DOB: 1960/12/21, 53 y.o.   MRN: [id number]  HPI Ryan Winters is a 53 yo with pmh as listed below comes in for hospital follow up.   Pt was d/c on 11/22/13 for atrial tachycardia. Pt still having palpations and feelings of "racing heart." No frank CP or HA, SOB, DOE. Pt has been compliant with medications since d/c including diltiazem XR 120mg  q daily and carvedilol 25 mg BID. Per chart review pt d/c HR in 80s. Otherwise pt is doing well and feels improved since d/c.   Substance abuse regarding cocaine use and grave importance of cessation in terms of health was had with patient. 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 Pt does admit to being a user-only smoking not IVDU- but has since quit for several years.    Review of Systems  Constitutional: Positive for fatigue. Negative for fever, chills, activity change and appetite change.  Respiratory: Negative for cough, chest tightness and shortness of breath.   Cardiovascular: Positive for palpitations. Negative for chest pain and leg swelling.  Gastrointestinal: Negative for nausea, vomiting, abdominal pain, diarrhea and constipation.  Neurological: Positive for dizziness (associated with palpitations). Negative for syncope and headaches.    Past Medical History  Diagnosis Date  . Hypertension   . Bipolar disorder   . Schizophrenia     [facility]  . Valvular heart disease     a. 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 b. 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  Marland Kitchen. Hyperlipidemia   . H/O: suicide attempt 1998    was hospitalized at [facility]  . Anemia     Hx of iron infusions  . Gastroparesis   . Arthritis   . Cocaine abuse     per pt none x 3 yrs however continues to "handle" it and deal it with + UDS.  Marland Kitchen. Anxiety   . CHF (congestive heart failure)     a. EF  25-30% by echo 12/02/12, but 55% by TEE 12/04/12.  Marland Kitchen. GERD (gastroesophageal reflux disease)   . Depression   . Vertigo   . Itchy skin   . Peripheral neuropathy     BLE  . Bilateral lower abdominal cramping   . Shortness of breath     "periodically; happens at any time" (03/30/13)  . Generalized headaches     "monthly" (03/30/2013)  . Chronic Winters disease     has fistula, not on HD; "go to [facility] q 90 days to have kidneys checked out" (03/30/2013)  . Atrial tachycardia     a. dx 11/2012: tx with amiodarone, cardioversion (discharge rhythm: likely junctional).   Social, surgical, family hx reviewed.     Objective:   Physical Exam Filed Vitals:   11/27/13 1001  BP: 117/79  Pulse: 120  Temp: 96.7 F (35.9 C)   General: sitting in chair, NAD HEENT: PERRL, EOMI, no scleral icterus Cardiac:  Tachy, RR, no rubs, murmurs or gallops Pulm: clear to auscultation bilaterally, moving normal volumes of air Abd: soft, nontender, nondistended, BS present Ext: warm and well perfused, no pedal edema Neuro: alert and oriented X3, cranial nerves II-XII grossly intact     Assessment & Plan:  Please see problem oriented charting.   Pt discussed with [facility].

## 2013-11-30 NOTE — Progress Notes (Signed)
Case discussed with Dr. Sadek soon after the resident saw the patient.  We reviewed the resident's history and exam and pertinent patient test results.  I agree with the assessment, diagnosis, and plan of care documented in the resident's note. 

## 2013-12-01 ENCOUNTER — Other Ambulatory Visit (HOSPITAL_COMMUNITY): Payer: Self-pay

## 2013-12-02 ENCOUNTER — Ambulatory Visit: Payer: Self-pay | Admitting: Internal Medicine

## 2013-12-02 ENCOUNTER — Encounter (HOSPITAL_COMMUNITY): Payer: Self-pay

## 2013-12-08 ENCOUNTER — Inpatient Hospital Stay (HOSPITAL_COMMUNITY)
Admission: EM | Admit: 2013-12-08 | Discharge: 2013-12-12 | DRG: 866 | Discharge status: Against Medical Advice | Payer: Medicaid Other | Attending: Internal Medicine | Admitting: Internal Medicine

## 2013-12-08 ENCOUNTER — Encounter (HOSPITAL_COMMUNITY): Payer: Self-pay

## 2013-12-08 ENCOUNTER — Encounter (HOSPITAL_COMMUNITY): Payer: Self-pay | Admitting: Emergency Medicine

## 2013-12-08 ENCOUNTER — Emergency Department (HOSPITAL_COMMUNITY): Payer: Medicaid Other

## 2013-12-08 DIAGNOSIS — K3184 Gastroparesis: Secondary | ICD-10-CM

## 2013-12-08 DIAGNOSIS — Z91199 Patient's noncompliance with other medical treatment and regimen due to unspecified reason: Secondary | ICD-10-CM

## 2013-12-08 DIAGNOSIS — D631 Anemia in chronic kidney disease: Secondary | ICD-10-CM

## 2013-12-08 DIAGNOSIS — I471 Supraventricular tachycardia: Secondary | ICD-10-CM

## 2013-12-08 DIAGNOSIS — I5033 Acute on chronic diastolic (congestive) heart failure: Secondary | ICD-10-CM

## 2013-12-08 DIAGNOSIS — R079 Chest pain, unspecified: Secondary | ICD-10-CM | POA: Diagnosis present

## 2013-12-08 DIAGNOSIS — I059 Rheumatic mitral valve disease, unspecified: Secondary | ICD-10-CM

## 2013-12-08 DIAGNOSIS — E46 Unspecified protein-calorie malnutrition: Secondary | ICD-10-CM | POA: Diagnosis present

## 2013-12-08 DIAGNOSIS — I2510 Atherosclerotic heart disease of native coronary artery without angina pectoris: Secondary | ICD-10-CM | POA: Diagnosis present

## 2013-12-08 DIAGNOSIS — Z9119 Patient's noncompliance with other medical treatment and regimen: Secondary | ICD-10-CM

## 2013-12-08 DIAGNOSIS — F1910 Other psychoactive substance abuse, uncomplicated: Secondary | ICD-10-CM

## 2013-12-08 DIAGNOSIS — T486X5A Adverse effect of antiasthmatics, initial encounter: Secondary | ICD-10-CM | POA: Diagnosis not present

## 2013-12-08 DIAGNOSIS — I517 Cardiomegaly: Secondary | ICD-10-CM | POA: Diagnosis present

## 2013-12-08 DIAGNOSIS — G609 Hereditary and idiopathic neuropathy, unspecified: Secondary | ICD-10-CM | POA: Diagnosis present

## 2013-12-08 DIAGNOSIS — R0600 Dyspnea, unspecified: Secondary | ICD-10-CM | POA: Diagnosis present

## 2013-12-08 DIAGNOSIS — N039 Chronic nephritic syndrome with unspecified morphologic changes: Secondary | ICD-10-CM

## 2013-12-08 DIAGNOSIS — F411 Generalized anxiety disorder: Secondary | ICD-10-CM | POA: Diagnosis present

## 2013-12-08 DIAGNOSIS — I4892 Unspecified atrial flutter: Secondary | ICD-10-CM

## 2013-12-08 DIAGNOSIS — Z7982 Long term (current) use of aspirin: Secondary | ICD-10-CM

## 2013-12-08 DIAGNOSIS — I5043 Acute on chronic combined systolic (congestive) and diastolic (congestive) heart failure: Secondary | ICD-10-CM

## 2013-12-08 DIAGNOSIS — F1210 Cannabis abuse, uncomplicated: Secondary | ICD-10-CM | POA: Diagnosis present

## 2013-12-08 DIAGNOSIS — I4719 Other supraventricular tachycardia: Secondary | ICD-10-CM

## 2013-12-08 DIAGNOSIS — I129 Hypertensive chronic kidney disease with stage 1 through stage 4 chronic kidney disease, or unspecified chronic kidney disease: Secondary | ICD-10-CM | POA: Diagnosis present

## 2013-12-08 DIAGNOSIS — Z954 Presence of other heart-valve replacement: Secondary | ICD-10-CM

## 2013-12-08 DIAGNOSIS — E876 Hypokalemia: Secondary | ICD-10-CM | POA: Diagnosis not present

## 2013-12-08 DIAGNOSIS — E785 Hyperlipidemia, unspecified: Secondary | ICD-10-CM | POA: Diagnosis present

## 2013-12-08 DIAGNOSIS — N184 Chronic kidney disease, stage 4 (severe): Secondary | ICD-10-CM

## 2013-12-08 DIAGNOSIS — Y9210 Unspecified residential institution as the place of occurrence of the external cause: Secondary | ICD-10-CM | POA: Diagnosis not present

## 2013-12-08 DIAGNOSIS — J209 Acute bronchitis, unspecified: Secondary | ICD-10-CM

## 2013-12-08 DIAGNOSIS — F319 Bipolar disorder, unspecified: Secondary | ICD-10-CM

## 2013-12-08 DIAGNOSIS — B348 Other viral infections of unspecified site: Secondary | ICD-10-CM

## 2013-12-08 DIAGNOSIS — Z9889 Other specified postprocedural states: Secondary | ICD-10-CM

## 2013-12-08 DIAGNOSIS — Z6825 Body mass index (BMI) 25.0-25.9, adult: Secondary | ICD-10-CM

## 2013-12-08 DIAGNOSIS — Z87891 Personal history of nicotine dependence: Secondary | ICD-10-CM

## 2013-12-08 DIAGNOSIS — D696 Thrombocytopenia, unspecified: Secondary | ICD-10-CM | POA: Diagnosis present

## 2013-12-08 DIAGNOSIS — J40 Bronchitis, not specified as acute or chronic: Secondary | ICD-10-CM

## 2013-12-08 DIAGNOSIS — I499 Cardiac arrhythmia, unspecified: Secondary | ICD-10-CM

## 2013-12-08 DIAGNOSIS — I509 Heart failure, unspecified: Secondary | ICD-10-CM | POA: Diagnosis present

## 2013-12-08 DIAGNOSIS — I5042 Chronic combined systolic (congestive) and diastolic (congestive) heart failure: Secondary | ICD-10-CM

## 2013-12-08 DIAGNOSIS — I4891 Unspecified atrial fibrillation: Secondary | ICD-10-CM | POA: Diagnosis present

## 2013-12-08 DIAGNOSIS — I10 Essential (primary) hypertension: Secondary | ICD-10-CM

## 2013-12-08 DIAGNOSIS — F1410 Cocaine abuse, uncomplicated: Secondary | ICD-10-CM

## 2013-12-08 DIAGNOSIS — B9789 Other viral agents as the cause of diseases classified elsewhere: Principal | ICD-10-CM | POA: Diagnosis present

## 2013-12-08 DIAGNOSIS — N179 Acute kidney failure, unspecified: Secondary | ICD-10-CM | POA: Diagnosis present

## 2013-12-08 DIAGNOSIS — F209 Schizophrenia, unspecified: Secondary | ICD-10-CM

## 2013-12-08 DIAGNOSIS — Z952 Presence of prosthetic heart valve: Secondary | ICD-10-CM

## 2013-12-08 DIAGNOSIS — I340 Nonrheumatic mitral (valve) insufficiency: Secondary | ICD-10-CM

## 2013-12-08 DIAGNOSIS — K219 Gastro-esophageal reflux disease without esophagitis: Secondary | ICD-10-CM

## 2013-12-08 DIAGNOSIS — I2789 Other specified pulmonary heart diseases: Secondary | ICD-10-CM | POA: Diagnosis present

## 2013-12-08 LAB — CBC
HCT: 38.9 % — ABNORMAL LOW (ref 39.0–52.0)
HEMOGLOBIN: 13.1 g/dL (ref 13.0–17.0)
MCH: 27.9 pg (ref 26.0–34.0)
MCHC: 33.7 g/dL (ref 30.0–36.0)
MCV: 82.8 fL (ref 78.0–100.0)
Platelets: 126 10*3/uL — ABNORMAL LOW (ref 150–400)
RBC: 4.7 MIL/uL (ref 4.22–5.81)
RDW: 18.5 % — ABNORMAL HIGH (ref 11.5–15.5)
WBC: 4.9 10*3/uL (ref 4.0–10.5)

## 2013-12-08 LAB — RAPID URINE DRUG SCREEN, HOSP PERFORMED
AMPHETAMINES: NOT DETECTED
BARBITURATES: NOT DETECTED
Benzodiazepines: NOT DETECTED
Cocaine: POSITIVE — AB
Opiates: NOT DETECTED
Tetrahydrocannabinol: NOT DETECTED

## 2013-12-08 LAB — BASIC METABOLIC PANEL
BUN: 25 mg/dL — ABNORMAL HIGH (ref 6–23)
CALCIUM: 8.7 mg/dL (ref 8.4–10.5)
CO2: 21 meq/L (ref 19–32)
CREATININE: 2.77 mg/dL — AB (ref 0.50–1.35)
Chloride: 104 mEq/L (ref 96–112)
GFR calc Af Amer: 29 mL/min — ABNORMAL LOW (ref >90)
GFR, EST NON AFRICAN AMERICAN: 25 mL/min — AB (ref >90)
GLUCOSE: 99 mg/dL (ref 70–99)
Potassium: 4.4 mEq/L (ref 3.7–5.3)
Sodium: 138 mEq/L (ref 137–147)

## 2013-12-08 LAB — TROPONIN I: Troponin I: <0.3 ng/mL (ref <0.30)

## 2013-12-08 LAB — CG4 I-STAT (LACTIC ACID): Lactic Acid, Venous: 0.94 mmol/L (ref 0.5–2.2)

## 2013-12-08 LAB — PRO B NATRIURETIC PEPTIDE: Pro B Natriuretic peptide (BNP): 4938 pg/mL — ABNORMAL HIGH (ref 0–125)

## 2013-12-08 MED ORDER — GUAIFENESIN-DM 100-10 MG/5ML PO SYRP
5.0000 mL | ORAL_SOLUTION | ORAL | Status: DC | PRN
  Administered 2013-12-09 – 2013-12-12 (×8): 5 mL via ORAL
  Filled 2013-12-08 (×8): qty 5

## 2013-12-08 MED ORDER — ONDANSETRON HCL 4 MG/2ML IJ SOLN
4.0000 mg | Freq: Three times a day (TID) | INTRAMUSCULAR | Status: DC | PRN
Start: 2013-12-08 — End: 2013-12-08

## 2013-12-08 MED ORDER — PREDNISONE 20 MG PO TABS
60.0000 mg | ORAL_TABLET | ORAL | Status: AC
  Administered 2013-12-08: 60 mg via ORAL
  Filled 2013-12-08: qty 3

## 2013-12-08 MED ORDER — SODIUM CHLORIDE 0.9 % IV SOLN: 250.0000 mL | INTRAVENOUS | Status: DC | PRN

## 2013-12-08 MED ORDER — FUROSEMIDE 10 MG/ML IJ SOLN
40.0000 mg | Freq: Once | INTRAMUSCULAR | Status: AC
  Administered 2013-12-08: 40 mg via INTRAVENOUS
  Filled 2013-12-08: qty 4

## 2013-12-08 MED ORDER — ALBUTEROL SULFATE (2.5 MG/3ML) 0.083% IN NEBU
5.0000 mg | INHALATION_SOLUTION | Freq: Once | RESPIRATORY_TRACT | Status: AC
  Administered 2013-12-08: 5 mg via RESPIRATORY_TRACT
  Filled 2013-12-08: qty 6

## 2013-12-08 MED ORDER — ALBUTEROL SULFATE (2.5 MG/3ML) 0.083% IN NEBU
2.5000 mg | INHALATION_SOLUTION | RESPIRATORY_TRACT | Status: DC
  Administered 2013-12-08: 2.5 mg via RESPIRATORY_TRACT
  Filled 2013-12-08: qty 3

## 2013-12-08 MED ORDER — NITROGLYCERIN 2 % TD OINT
1.0000 [in_us] | TOPICAL_OINTMENT | Freq: Once | TRANSDERMAL | Status: AC
Start: 2013-12-08 — End: 2013-12-08
  Administered 2013-12-08: 1 [in_us] via TOPICAL
  Filled 2013-12-08: qty 1

## 2013-12-08 MED ORDER — DILTIAZEM HCL ER 90 MG PO CP12
240.0000 mg | ORAL_CAPSULE | Freq: Two times a day (BID) | ORAL | Status: DC
  Filled 2013-12-08 (×2): qty 1

## 2013-12-08 MED ORDER — IPRATROPIUM-ALBUTEROL 0.5-2.5 (3) MG/3ML IN SOLN
3.0000 mL | RESPIRATORY_TRACT | Status: DC
  Administered 2013-12-09: 3 mL via RESPIRATORY_TRACT
  Filled 2013-12-08: qty 3

## 2013-12-08 MED ORDER — METHYLPREDNISOLONE SODIUM SUCC 125 MG IJ SOLR
80.0000 mg | Freq: Every day | INTRAMUSCULAR | Status: DC
  Administered 2013-12-08 – 2013-12-09 (×2): 80 mg via INTRAVENOUS
  Filled 2013-12-08 (×3): qty 1.28

## 2013-12-08 MED ORDER — IPRATROPIUM BROMIDE 0.02 % IN SOLN
0.5000 mg | RESPIRATORY_TRACT | Status: DC
Start: 2013-12-08 — End: 2013-12-08
  Administered 2013-12-08: 0.5 mg via RESPIRATORY_TRACT
  Filled 2013-12-08: qty 2.5

## 2013-12-08 MED ORDER — OSELTAMIVIR PHOSPHATE 75 MG PO CAPS: 75.0000 mg | ORAL_CAPSULE | Freq: Two times a day (BID) | ORAL | Status: DC

## 2013-12-08 MED ORDER — OSELTAMIVIR PHOSPHATE 30 MG PO CAPS
30.0000 mg | ORAL_CAPSULE | ORAL | Status: DC
Start: 2013-12-08 — End: 2013-12-10
  Administered 2013-12-08 – 2013-12-09 (×2): 30 mg via ORAL
  Filled 2013-12-08 (×4): qty 1

## 2013-12-08 MED ORDER — ACETAMINOPHEN 500 MG PO TABS
1000.0000 mg | ORAL_TABLET | Freq: Once | ORAL | Status: AC
  Administered 2013-12-08: 1000 mg via ORAL
  Filled 2013-12-08: qty 2

## 2013-12-08 MED ORDER — ASPIRIN EC 81 MG PO TBEC
81.0000 mg | DELAYED_RELEASE_TABLET | Freq: Every day | ORAL | Status: DC
  Administered 2013-12-08 – 2013-12-12 (×5): 81 mg via ORAL
  Filled 2013-12-08 (×5): qty 1

## 2013-12-08 MED ORDER — LEVOFLOXACIN IN D5W 750 MG/150ML IV SOLN
750.0000 mg | Freq: Once | INTRAVENOUS | Status: AC
  Administered 2013-12-08: 750 mg via INTRAVENOUS
  Filled 2013-12-08: qty 150

## 2013-12-08 MED ORDER — ALBUTEROL (5 MG/ML) CONTINUOUS INHALATION SOLN
10.0000 mg/h | INHALATION_SOLUTION | RESPIRATORY_TRACT | Status: DC
  Administered 2013-12-08: 10 mg/h via RESPIRATORY_TRACT
  Filled 2013-12-08: qty 20

## 2013-12-08 MED ORDER — HEPARIN SODIUM (PORCINE) 5000 UNIT/ML IJ SOLN
5000.0000 [IU] | Freq: Three times a day (TID) | INTRAMUSCULAR | Status: DC
  Filled 2013-12-08 (×15): qty 1

## 2013-12-08 MED ORDER — SODIUM CHLORIDE 0.9 % IJ SOLN
3.0000 mL | Freq: Two times a day (BID) | INTRAMUSCULAR | Status: DC
  Administered 2013-12-08 – 2013-12-11 (×6): 3 mL via INTRAVENOUS

## 2013-12-08 MED ORDER — FUROSEMIDE 40 MG PO TABS
40.0000 mg | ORAL_TABLET | Freq: Two times a day (BID) | ORAL | Status: DC
  Administered 2013-12-08 – 2013-12-09 (×3): 40 mg via ORAL
  Filled 2013-12-08 (×6): qty 1

## 2013-12-08 MED ORDER — SODIUM CHLORIDE 0.9 % IJ SOLN
3.0000 mL | Freq: Two times a day (BID) | INTRAMUSCULAR | Status: DC
  Administered 2013-12-10 – 2013-12-12 (×4): 3 mL via INTRAVENOUS

## 2013-12-08 MED ORDER — HYDRALAZINE HCL 100 MG PO TABS
100.0000 mg | ORAL_TABLET | Freq: Two times a day (BID) | ORAL | Status: DC
  Administered 2013-12-08 – 2013-12-12 (×8): 100 mg via ORAL
  Filled 2013-12-08 (×9): qty 1

## 2013-12-08 MED ORDER — LEVOFLOXACIN IN D5W 500 MG/100ML IV SOLN: 500.0000 mg | INTRAVENOUS | Status: DC

## 2013-12-08 MED ORDER — DILTIAZEM HCL ER 60 MG PO CP12
240.0000 mg | ORAL_CAPSULE | Freq: Two times a day (BID) | ORAL | Status: DC
Start: 2013-12-08 — End: 2013-12-10
  Administered 2013-12-08 – 2013-12-09 (×3): 240 mg via ORAL
  Filled 2013-12-08 (×6): qty 4

## 2013-12-08 MED ORDER — PANTOPRAZOLE SODIUM 40 MG PO TBEC
40.0000 mg | DELAYED_RELEASE_TABLET | Freq: Every day | ORAL | Status: DC
  Administered 2013-12-08 – 2013-12-12 (×5): 40 mg via ORAL
  Filled 2013-12-08 (×4): qty 1

## 2013-12-08 MED ORDER — ARIPIPRAZOLE 5 MG PO TABS
5.0000 mg | ORAL_TABLET | Freq: Every day | ORAL | Status: DC
  Administered 2013-12-08 – 2013-12-11 (×4): 5 mg via ORAL
  Filled 2013-12-08 (×5): qty 1

## 2013-12-08 MED ORDER — ACETAMINOPHEN 325 MG PO TABS
650.0000 mg | ORAL_TABLET | Freq: Four times a day (QID) | ORAL | Status: DC | PRN
  Administered 2013-12-08 – 2013-12-10 (×3): 650 mg via ORAL
  Filled 2013-12-08 (×3): qty 2

## 2013-12-08 MED ORDER — METOCLOPRAMIDE HCL 10 MG PO TABS
10.0000 mg | ORAL_TABLET | Freq: Four times a day (QID) | ORAL | Status: DC
  Administered 2013-12-08 – 2013-12-12 (×14): 10 mg via ORAL
  Filled 2013-12-08 (×18): qty 1

## 2013-12-08 MED ORDER — CARVEDILOL 25 MG PO TABS
25.0000 mg | ORAL_TABLET | Freq: Two times a day (BID) | ORAL | Status: DC
  Administered 2013-12-09 – 2013-12-12 (×7): 25 mg via ORAL
  Filled 2013-12-08 (×9): qty 1

## 2013-12-08 MED ORDER — SODIUM CHLORIDE 0.9 % IJ SOLN: 3.0000 mL | INTRAMUSCULAR | Status: DC | PRN

## 2013-12-08 MED ORDER — HYDROCORTISONE 1 % EX CREA: 1.0000 "application " | TOPICAL_CREAM | CUTANEOUS | Status: DC | PRN

## 2013-12-08 NOTE — Progress Notes (Addendum)
Pt rec from ed. No complaints of pain or sob, PT has meds in room Pt refuse to let RN take to pharm pT states family will come get. Diet order NPO pt had meal when arrive to unit. MD paged over diet .will continue to monitor

## 2013-12-08 NOTE — ED Notes (Signed)
Pt reports onset this am of body aches, headache, productive cough. Having bilateral chest pains and pain into his back, sob, and n/v. ekg done at triage.

## 2013-12-08 NOTE — Progress Notes (Signed)
Pt's HR sustained up to the 160 - 170, MD notified, order for stat EKG gotten, pt denies any CP or SOB at this time, pt ambulating frequently on the hallway, medications given as ordered, we'll continue to monitor.

## 2013-12-08 NOTE — Progress Notes (Signed)
EKG done as ordered pt is in and out A Fib with RVR, MD  Notified, pt placed on 2L O2 [place]. We'll continue to monitor.

## 2013-12-08 NOTE — ED Notes (Signed)
POD -A NOTIFIED OF PATIENTS CG4+ LACTIC ACID RESULTS @ 14:35 PM ,12/08/2013.

## 2013-12-08 NOTE — ED Notes (Signed)
Pt. Placed in hallway. Room is not clean on 3E but will be in 20 mins

## 2013-12-08 NOTE — ED Notes (Signed)
Doctor notified of temperature.

## 2013-12-08 NOTE — Progress Notes (Signed)
ANTIBIOTIC CONSULT NOTE - INITIAL  Pharmacy Consult for  Levaquin Indication: bronchitis  Allergies  Allergen Reactions  . Cashew Nut Oil Anaphylaxis  . Food Anaphylaxis    Strawberries, Cashews, causes throat to swell - affects breathing    Patient Measurements: Height: 5\' 6"  (167.6 cm) Weight: 150 lb 12.7 oz (68.4 kg) (scale b) IBW/kg (Calculated) : 63.8 Adjusted Body Weight:   Vital Signs: Temp: 98 F (36.7 C) (01/20 2326) Temp src: Oral (01/20 2326) BP: 154/83 mmHg (01/20 2326) Pulse Rate: 170 (01/20 2326) Intake/Output from previous day:   Intake/Output from this shift: Total I/O In: 220 [P.O.:220] Out: -   Labs:  Recent Labs  12/08/13 1421  WBC 4.9  HGB 13.1  PLT 126*  CREATININE 2.77*   Estimated Creatinine Clearance: 28.2 ml/min (by C-G formula based on Cr of 2.77). No results found for this basename: VANCOTROUGH, VANCOPEAK, VANCORANDOM, GENTTROUGH, GENTPEAK, GENTRANDOM, TOBRATROUGH, TOBRAPEAK, TOBRARND, AMIKACINPEAK, AMIKACINTROU, AMIKACIN,  in the last 72 hours   Microbiology: No results found for this or any previous visit (from the past 720 hour(s)).  Medical History: Past Medical History  Diagnosis Date  . Hypertension   . Bipolar disorder   . Schizophrenia     [facility]  . Valvular heart disease     a. 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 b. 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  Marland Kitchen. Hyperlipidemia   . H/O: suicide attempt 1998    [facility]  . Anemia     Hx of iron infusions  . Gastroparesis   . Arthritis   . Cocaine abuse     per pt none x 3 yrs however continues to "handle" it and deal it with + UDS.  Marland Kitchen. Anxiety   . CHF (congestive heart failure)     a. EF 25-30% by echo 12/02/12, but 55% by TEE 12/04/12.  Marland Kitchen. GERD (gastroesophageal reflux disease)   . Depression   . Vertigo   . Itchy skin   . Peripheral neuropathy     BLE  . Bilateral  lower abdominal cramping   . Shortness of breath     "periodically; happens at any time" (03/30/13)  . Generalized headaches     "monthly" (03/30/2013)  . Chronic kidney disease     has fistula, not on HD; "[facility] checked [facility]" (03/30/2013)  . Atrial tachycardia     a. dx 11/2012: tx with amiodarone, cardioversion (discharge rhythm: likely junctional).    Medications:  Scheduled:  . ARIPiprazole  5 mg Oral Daily  . aspirin EC  81 mg Oral Daily  . [START ON 12/09/2013] carvedilol  25 mg Oral BID WC  . diltiazem  240 mg Oral Q12H  . furosemide  40 mg Oral BID  . heparin  5,000 Units Subcutaneous Q8H  . hydrALAZINE  100 mg Oral BID  . [START ON 12/09/2013] ipratropium-albuterol  3 mL Nebulization Q4H  . [START ON 12/10/2013] levofloxacin (LEVAQUIN) IV  500 mg Intravenous Q48H  . levofloxacin (LEVAQUIN) IV  750 mg Intravenous Once  . methylPREDNISolone (SOLU-MEDROL) injection  80 mg Intravenous Daily  . metoCLOPramide  10 mg Oral QID  . oseltamivir  30 mg Oral Q24H  . pantoprazole  40 mg Oral Daily  . sodium chloride  3 mL Intravenous Q12H  . sodium chloride  3 mL Intravenous Q12H   Assessment: 53 yr old  male presents with body aches, headache, cough, and having chest pain and back pain.  Goal of Therapy:  Resolution of infection that is causing symptoms  Plan:  1) Levaquin 750 mg IV once, then 500 mg q48h for CrCl of 28.  Eugene Garnet 12/08/2013,11:39 PM

## 2013-12-08 NOTE — ED Notes (Signed)
Admit Doctor at bedside.  

## 2013-12-08 NOTE — ED Notes (Signed)
Turkey sandwich, apple sauce, and water given to patient.  

## 2013-12-08 NOTE — ED Notes (Signed)
Patient placed on pulsox and heart monitor

## 2013-12-08 NOTE — ED Notes (Signed)
MD at bedside. 

## 2013-12-08 NOTE — H&P (Signed)
Date: 12/08/2013               Patient Name:  [facility] MRN: [id number]  DOB: Mar 27, 1961 Age / Sex: 53 y.o., male   PCP: Pleas Koch, MD         Medical Service: Internal Medicine Teaching Service         Attending Physician: Dr. Farley Ly, MD    First Contact: Dr. Johna Roles Pager: [phone]  Second Contact: Dr. Virgina Organ Pager: [phone]       After Hours (After 5p/  First Contact Pager: [phone]  weekends / holidays): Second Contact Pager: [phone]   Chief Complaint:   History of Present Illness:  Patient is a 53 yo AA male with PMH of cocaine abuse, chronic combined systolic diastolic CHF (EF 57-84% on 03/31/2013), ectopic atrial rhythm s/p cardioversion (Jan 2014), tricuspid and mitral valvular disorder s/p mitral valve replacement and tricuspid repair (2013), Chronic Renal Kidney Disease Stage IV s/p AV fistula without hemodialysis (creatinine 2.7-3.7), schizophrenia and bipolar disorder and CAD who presents with c/o SOB.  Patient reports that he had onset of HA, SOB, rhinorrhea, chills, cough productive of yellow-green sputum yesterday evening. Patient reports having fits of coughing and after one of these episodes he had onset of "burning" nonradiating chest pain. He has some SOB, though he says this is baseline for him. No nausea, diaphoresis with this episode. He does endorse having chronic intermittent palpitations, these were present intermittently over the last 24 hours. He also had 1 episode of post-tussive emesis this morning. Pt did have a sick contact at home. No ST, body aches, fever, abd pain, nausea, diarrhea. He has been eating and drinking normally.   Patient was recently hospitalized 1/2-1/4 for chest pain and atrial tachycardia. He was rate controlled with cardizem gtt and then transitioned to oral coreg 25mg  BID, cardizem 120mg  BID (discharged on these as well). His amlodipine was discontinued. UDS+ cocaine this admission, and many other admissions.  Currently managed on lasix 40mg  bid, hydralazine 100mg  BID as well. Patient was seen for hospital f/u visit in [facility] where his diltiazem further increased to 240mg  BID given patient was still complaining of palpitations and his EKG showed A flutter vs atrial tachy. Patient, however has still been taking diltiazem 120mg  daily rather than this increased dose. He is also taking hydralazine 75mg  TID, rather than 100mg  BID (as per d/c summary on 1/4). He does report compliance with his medications.  In the ED, patient VS- T 101.6, BP 145-170/91-113, HR 120s-150s, RR 22, satting 100% on nebulizer machine. Patient had BMP- Cr 2.77 (b/l). CBC wnl. ProBNP 4938. CXR showed trace interstitial edema c/w mild CHF. Patient received albuterol nebulizer, lasix 40mg  IV x 1, NTG paste, prednisone 60mg  PO x 1.    Meds: No current facility-administered medications for this encounter.   Current Outpatient Prescriptions  Medication Sig Dispense Refill  . acetaminophen (TYLENOL) 325 MG tablet Take 650 mg by mouth every 6 (six) hours as needed for pain.      Marland Kitchen albuterol (PROVENTIL HFA;VENTOLIN HFA) 108 (90 BASE) MCG/ACT inhaler Inhale 2 puffs into the lungs every 6 (six) hours as needed for wheezing.      . ARIPiprazole (ABILIFY) 5 MG tablet Take 5 mg by mouth daily.       Marland Kitchen aspirin EC 81 MG tablet Take 81 mg by mouth daily.      . carvedilol (COREG) 25 MG tablet Take 25 mg by mouth 2 (two) times daily with  a meal.      . diltiazem (CARDIZEM SR) 120 MG 12 hr capsule Take 2 capsules (240 mg total) by mouth every 12 (twelve) hours.  60 capsule  0  . furosemide (LASIX) 40 MG tablet Take 40 mg by mouth 2 (two) times daily.      . hydrALAZINE (APRESOLINE) 100 MG tablet Take 100 mg by mouth 2 (two) times daily.      . hydrocortisone cream 1 % Apply topically as needed for itching.  30 g  0  . metoCLOPramide (REGLAN) 10 MG tablet Take 1 tablet (10 mg total) by mouth 4 (four) times daily.  30 tablet  3  . omeprazole (PRILOSEC) 40  MG capsule Take 40 mg by mouth 2 (two) times daily as needed (heartburn).        Allergies: Allergies as of 12/08/2013 - Review Complete 12/08/2013  Allergen Reaction Noted  . 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 Food Anaphylaxis 06/20/2011   Past Medical History  Diagnosis Date  . Hypertension   . Bipolar disorder   . Schizophrenia     [facility]  . Valvular heart disease     a. 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 b. 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  Marland Kitchen. Hyperlipidemia   . H/O: suicide attempt 1998    [facility]  . Anemia     Hx of iron infusions  . Gastroparesis   . Arthritis   . Cocaine abuse     per pt none x 3 yrs however continues to "handle" it and deal it with + UDS.  Marland Kitchen. Anxiety   . CHF (congestive heart failure)     a. EF 25-30% by echo 12/02/12, but 55% by TEE 12/04/12.  Marland Kitchen. GERD (gastroesophageal reflux disease)   . Depression   . Vertigo   . Itchy skin   . Peripheral neuropathy     BLE  . Bilateral lower abdominal cramping   . Shortness of breath     "periodically; happens at any time" (03/30/13)  . Generalized headaches     "monthly" (03/30/2013)  . Chronic kidney disease     has fistula, not on HD; "go to [facility] q 90 days to have kidneys checked out" (03/30/2013)  . Atrial tachycardia     a. dx 11/2012: tx with amiodarone, cardioversion (discharge rhythm: likely junctional).   Past Surgical History  Procedure Laterality Date  . Mitral valve repair  05/2010  . Av fistula placement, radiocephalic Left 05/24/2011    arm; "aien't never had to use it" (03/30/2013)  . Laceration repair Right ~ 1979  . Umbilical hernia repair  04/01/2012    Procedure: HERNIA REPAIR UMBILICAL ADULT;  Surgeon: Ardeth SportsmanSteven C. Gross, MD;  [facility];  Service: General;  Laterality: N/A;  supra umbilical  . Tee without cardioversion  08/25/2012    Procedure: TRANSESOPHAGEAL  ECHOCARDIOGRAM (TEE);  Surgeon: Wendall StadePeter C Nishan, MD;  [facility];  Service: Cardiovascular;  Laterality: N/A;  . Mitral valve replacement  09/23/2012    Procedure: REDO MITRAL VALVE REPLACEMENT (MVR);  Surgeon: Kerin PernaPeter Van Trigt, MD;  [facility];  Service: Open Heart Surgery;  Laterality: N/A;  Nitric Oxide  . Tricuspid valve replacement  09/23/2012    Procedure: TRICUSPID VALVE REPAIR;  Surgeon: Kerin PernaPeter Van Trigt, MD;  [facility];  Service: Open Heart Surgery;  Laterality: N/A;  . Tee without cardioversion  12/04/2012    Procedure: TRANSESOPHAGEAL ECHOCARDIOGRAM (TEE);  Surgeon: Laurey Morale, MD;  Location: [facility] ENDOSCOPY;  Service: Cardiovascular;  Laterality: N/A;  CV 1015/vicki, dl  . Cardioversion  1/61/0960    Procedure: CARDIOVERSION;  Surgeon: Laurey Morale, MD;  Location: [facility] ENDOSCOPY;  Service: Cardiovascular;  Laterality: N/A;  . Cardiac valve replacement      mitral  . Cardiac catheterization  05/26/2010    Hattie Perch 03/30/2013   Family History  Problem Relation Age of Onset  . Hypertension Mother   . Heart disease Mother   . Heart disease Brother    History   Social History  . Marital Status: Married    Spouse Name: N/A    Number of Children: N/A  . Years of Education: N/A   Occupational History  . disabled    Social History Main Topics  . Smoking status: Former Smoker -- 1.50 packs/day for 30 years    Types: Cigarettes    Quit date: 03/21/2011  . Smokeless tobacco: Never Used  . Alcohol Use: No     Comment: Quit alcohol in July of 2011  . Drug Use: Yes - last cocaine use 4 weeks ago     Comment: None since November "?2012" when had rectal bleeding and went to hospital  . Sexual Activity: Not Currently   Other Topics Concern  . Not on file   Social History Narrative   09/09/2012   He lives with mother and is separated from wife. Daughter in [place] with 2 grandchildren. He denies any cigs, drugs, alcohol at present.     Review of  Systems: General: see HPI Skin: no rash HEENT: see HPI Pulm: see HPI CV: see HPI Abd: +posttussive emesis x 1, no abdominal pain, nausea, diarrhea/constipation GU: no dysuria, hematuria, polyuria Ext: no arthralgias, myalgias Neuro: no weakness, numbness, or tingling  Physical Exam: Blood pressure 145/91, pulse 130, temperature 97.9 F (36.6 C), temperature source Oral, resp. rate 22, SpO2 100.00%. on albuterol neb General: sitting upright in bed, speaking in complete sentences; alert, cooperative, and in no apparent distress HEENT: pupils equal round and reactive to light, vision grossly intact, oropharynx clear and non-erythematous; MMM Neck: supple Lungs: diffuse expiratory wheezing to bilateral lung fields, ? crackles Heart:  Tachycardic to 150s, no murmurs, gallops, or rubs Abdomen: soft, non-tender, non-distended, normal bowel sounds Extremities: warm extremities without BLE edema Neurologic: alert & oriented X3, cranial nerves II-XII grossly intact, strength grossly intact, sensation intact to light touch  Lab results: Basic Metabolic Panel:  Recent Labs  45/40/98 1421  NA 138  K 4.4  CL 104  CO2 21  GLUCOSE 99  BUN 25*  CREATININE 2.77*  CALCIUM 8.7   CBC:  Recent Labs  12/08/13 1421  WBC 4.9  HGB 13.1  HCT 38.9*  MCV 82.8  PLT 126*   BNP:  Recent Labs  12/08/13 1338  PROBNP 4938.0*   Urine Drug Screen: Drugs of Abuse     Component Value Date/Time   LABOPIA NONE DETECTED 11/20/2013 0505   LABOPIA NEGATIVE 05/10/2012 0433   COCAINSCRNUR POSITIVE* 11/20/2013 0505   COCAINSCRNUR POSITIVE* 05/10/2012 0433   LABBENZ NONE DETECTED 11/20/2013 0505   LABBENZ NEGATIVE 05/10/2012 0433   AMPHETMU NONE DETECTED 11/20/2013 0505   AMPHETMU NEGATIVE 05/10/2012 0433   THCU NONE DETECTED 11/20/2013 0505   LABBARB NONE DETECTED 11/20/2013 0505     Imaging results:  Dg Chest 2 View  12/08/2013   CLINICAL DATA:  Chest pain  and shortness of breath  EXAM: CHEST  2 VIEW   COMPARISON:  November 21, 2013  FINDINGS: There is trace interstitial edema. There is no airspace consolidation. Heart is enlarged with pulmonary venous hypertension. No adenopathy. Patient is status post mitral and tricuspid valve replacements. No bony lesions.  IMPRESSION: Evidence of a degree of congestive heart failure.  No consolidation.   Electronically Signed   By: Bretta Bang M.D.   On: 12/08/2013 15:16   Other results: EKG: atrial tachycardia, incomplete RBBB (stable), some stable T wave changes.  Assessment & Plan by Problem:  # Acute bronchitis: Patient presents w/ flu-like symptoms and new onset of productive cough x 1 day, found to have fever to 101.6, and tachycardia (2/4 SIRS). His CXR does not show signs if consolidation though does show interstitial edema perhaps representing early CHF exacerbation, but given the rest of his clinical picture this is more likely to represent acute bronchitis. Less likely PE as he has no BLE swelling or tenderness, no recent period of immobilization and no hx of malignancy. No signs of PNA on CXR, though this may appear later. Patient was recently hospitalized, so will need to monitor patient closely and if he does not improve we may need to increase antibiotic spectrum to include HCAP organisms. Chest pain likely represents MSK as it began with coughing and is worsened by coughing, however, given his hx of cocaine abuse I believe it is prudent to rule out ACS. -admit to telemetry -levaquin per pharmacy -CBC, CMP in am -cycle CE -respiratory virus panel -tylenol prn for fever -duoneb q4h scheduled -robitussin DM -solumedrol 80mg  IV daily -tamiflu 30mg  daily for prophylaxis (total 5 days) -BCx x 2 pending -sputum culture pending  # hx of sCHF: Patient with EF 25-30% on 11/2012, though with repeat Echo on 03/2013 showed EF 50-55%. Patient appears euvolemic on exam. However, CXR does show signs of mild CHF, so pt received lasix 40mg  IV x 1 dose in  the ED. I do not think he is in an acute exacerbation at this time. Baseline weight 165lbs, 162lbs on day prior to last discharge. Weight on admission 150lbs. -I/Os -daily weights  -lasix 40mg  PO bid (home dose)  #HTN: BP mildly elevated on admission. He is supposed to be on hydralazine 100mg  BID, coreg 25mg  BID, lasix 40mg  BID, diltiazem 240mg  BID. However, he has not been taking his hydralazine or cardizem as prescribed. -continue home medications: hydralazine 100mg  BID, coreg 25mg  BID, lasix 40mg  BID, diltiazem 240mg  BID  # Cocaine abuse: Hx of multiple admissions with UDS + cocaine. He says he last used 4 weeks ago. If patient does continue to use, this is likely contributing to his atrial tachycardia.   -UDS  # Gastroparesis and GERD: Chest "burning" may represent GERD, though patient does not think the current CP feels like his acid reflux. -continue home reglan and protonix  # Schizophrenia/ Bipolar d/o: Continue home medications. -abilify 5mg  daily  # VTE: heparin  # Diet: renal diet  Code status: full   Dispo: Disposition is deferred at this time, awaiting improvement of current medical problems. Anticipated discharge in approximately 1-2 day(s).   The patient does have a current PCP Pleas Koch, MD) and does not need an [facility] hospital follow-up appointment after discharge.  The patient does not have transportation limitations that hinder transportation to clinic appointments.  Signed: Windell Hummingbird, MD 12/08/2013, 5:09 PM

## 2013-12-08 NOTE — ED Provider Notes (Signed)
CSN: [id number]     Arrival date & time 12/08/13  1327 History   First MD Initiated Contact with Patient 12/08/13 1341     Chief Complaint  Patient presents with  . Chest Pain  . Shortness of Breath   (Consider location/radiation/quality/duration/timing/severity/associated sxs/prior Treatment) HPI Patient presents with multiple complaints.  He notes the symptoms began yesterday without clear precipitant. Since yesterday he has a generalized body discomfort, worsening cough, chest pain with coughing, headache, diaphoresis. No relief with anything. Symptoms are progressive. No confusion, disorientation, syncope, vomiting, diarrhea. Patient states that he takes all medication as directed, including aspirin daily.  Past Medical History  Diagnosis Date  . Hypertension   . Bipolar disorder   . Schizophrenia     [facility]  . Valvular heart disease     a. 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 b. 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  Marland Kitchen. Hyperlipidemia   . H/O: suicide attempt 1998    [facility]  . Anemia     Hx of iron infusions  . Gastroparesis   . Arthritis   . Cocaine abuse     per pt none x 3 yrs however continues to "handle" it and deal it with + UDS.  Marland Kitchen. Anxiety   . CHF (congestive heart failure)     a. EF 25-30% by echo 12/02/12, but 55% by TEE 12/04/12.  Marland Kitchen. GERD (gastroesophageal reflux disease)   . Depression   . Vertigo   . Itchy skin   . Peripheral neuropathy     BLE  . Bilateral lower abdominal cramping   . Shortness of breath     "periodically; happens at any time" (03/30/13)  . Generalized headaches     "monthly" (03/30/2013)  . Chronic kidney disease     has fistula, not on HD; "[facility] have kidneys checked out" (03/30/2013)  . Atrial tachycardia     a. dx 11/2012: tx with amiodarone, cardioversion (discharge rhythm: likely junctional).   Past  Surgical History  Procedure Laterality Date  . Mitral valve repair  05/2010  . Av fistula placement, radiocephalic Left 05/24/2011    arm; "aien't never had to use it" (03/30/2013)  . Laceration repair Right ~ 1979  . Umbilical hernia repair  04/01/2012    Procedure: HERNIA REPAIR UMBILICAL ADULT;  Surgeon: Ardeth SportsmanSteven C. Gross, MD;  [facility];  Service: General;  Laterality: N/A;  supra umbilical  . Tee without cardioversion  08/25/2012    Procedure: TRANSESOPHAGEAL ECHOCARDIOGRAM (TEE);  Surgeon: Wendall StadePeter C Nishan, MD;  [facility];  Service: Cardiovascular;  Laterality: N/A;  . Mitral valve replacement  09/23/2012    Procedure: REDO MITRAL VALVE REPLACEMENT (MVR);  Surgeon: Kerin PernaPeter Van Trigt, MD;  [facility];  Service: Open Heart Surgery;  Laterality: N/A;  Nitric Oxide  . Tricuspid valve replacement  09/23/2012    Procedure: TRICUSPID VALVE REPAIR;  Surgeon: Kerin PernaPeter Van Trigt, MD;  [facility];  Service: Open Heart Surgery;  Laterality: N/A;  . Tee without cardioversion  12/04/2012    Procedure: TRANSESOPHAGEAL ECHOCARDIOGRAM (TEE);  Surgeon: Laurey Moralealton S McLean, MD;  [facility];  Service: Cardiovascular;  Laterality: N/A;  CV 1015/vicki, dl  . Cardioversion  6/96/29521/16/2014    Procedure: CARDIOVERSION;  Surgeon: Laurey Moralealton S McLean, MD;  [facility];  Service: Cardiovascular;  Laterality: N/A;  . Cardiac valve replacement  mitral  . Cardiac catheterization  05/26/2010    Hattie Perch 03/30/2013   Family History  Problem Relation Age of Onset  . Hypertension Mother   . Heart disease Mother   . Heart disease Brother    History  Substance Use Topics  . Smoking status: Former Smoker -- 1.50 packs/day for 30 years    Types: Cigarettes    Quit date: 03/21/2011  . Smokeless tobacco: Never Used  . Alcohol Use: No     Comment: Quit alcohol in July of 2011    Review of Systems  Constitutional:       Per HPI, otherwise negative  HENT:       Per HPI, otherwise negative   Respiratory:       Per HPI, otherwise negative  Cardiovascular:       Per HPI, otherwise negative  Gastrointestinal: Negative for vomiting.  Endocrine:       Negative aside from HPI  Genitourinary:       Neg aside from HPI   Musculoskeletal:       Per HPI, otherwise negative  Skin: Negative.   Neurological: Negative for syncope.    Allergies  Cashew nut oil and Food  Home Medications   Current Outpatient Rx  Name  Route  Sig  Dispense  Refill  . acetaminophen (TYLENOL) 325 MG tablet   Oral   Take 650 mg by mouth every 6 (six) hours as needed for pain.         Marland Kitchen albuterol (PROVENTIL HFA;VENTOLIN HFA) 108 (90 BASE) MCG/ACT inhaler   Inhalation   Inhale 2 puffs into the lungs every 6 (six) hours as needed for wheezing.         . ARIPiprazole (ABILIFY) 5 MG tablet   Oral   Take 5 mg by mouth daily.          Marland Kitchen aspirin EC 81 MG tablet   Oral   Take 81 mg by mouth daily.         . carvedilol (COREG) 25 MG tablet   Oral   Take 25 mg by mouth 2 (two) times daily with a meal.         . diltiazem (CARDIZEM SR) 120 MG 12 hr capsule   Oral   Take 2 capsules (240 mg total) by mouth every 12 (twelve) hours.   60 capsule   0   . furosemide (LASIX) 40 MG tablet   Oral   Take 40 mg by mouth 2 (two) times daily.         . hydrALAZINE (APRESOLINE) 100 MG tablet   Oral   Take 100 mg by mouth 2 (two) times daily.         . hydrocortisone cream 1 %   Topical   Apply topically as needed for itching.   30 g   0   . metoCLOPramide (REGLAN) 10 MG tablet   Oral   Take 1 tablet (10 mg total) by mouth 4 (four) times daily.   30 tablet   3   . omeprazole (PRILOSEC) 40 MG capsule   Oral   Take 40 mg by mouth 2 (two) times daily as needed (heartburn).          BP 168/103  Pulse 128  Temp(Src) 99.4 F (37.4 C) (Oral)  Resp 36  SpO2 98% Physical Exam  Nursing note and vitals reviewed. Constitutional: He is oriented to person, place, and time. He appears  well-developed.  Uncomfortable appearing male  HENT:  Head: Normocephalic and atraumatic.  Eyes: Conjunctivae and EOM are normal.  Cardiovascular: Regular rhythm.  Tachycardia present.   Pulmonary/Chest: Accessory muscle usage present. No stridor. Tachypnea noted. He has decreased breath sounds. He has wheezes.  Abdominal: He exhibits no distension.  Musculoskeletal: He exhibits no edema.  Neurological: He is alert and oriented to person, place, and time.  Skin: Skin is warm. He is diaphoretic.  Psychiatric: He has a normal mood and affect.    ED Course  Procedures (including critical care time) Labs Review Labs Reviewed  CBC  BASIC METABOLIC PANEL  PRO B NATRIURETIC PEPTIDE   Imaging Review No results found.  EKG Interpretation    Date/Time:  Tuesday December 08 2013 13:34:16 EST Ventricular Rate:  128 PR Interval:    QRS Duration: 82 QT Interval:  314 QTC Calculation: 458 R Axis:   91 Text Interpretation:  Accelerated Junctional rhythm Rightward axis Left ventricular hypertrophy with repolarization abnormality Abnormal ECG tachycarda - accelerated junctional vs. atrial Rightward axis No significant change since last tracing Abnormal ekg Confirmed by Gerhard Munch  MD [phone]) on 12/08/2013 2:15:59 PM           I reviewed the patient's EMR. Notably, the patient was recently admitted for pneumonia. Patient has recent echocardiogram with EF 55%   3:35 PM On exam the patient is now febrile, remains tachypneic, tachycardic. I discussed his care with our admitting team.  I reviewed the x-ray, no overt evidence of pneumonia, though there is some pulmonary congestion.  Patient has elevated BNP, but persistent renal dysfunction. He will be provided one dose of IV Lasix.  MDM   1. Dyspnea    This patient with multiple medical problems, including CHF, substance abuse, hypertension now presents with dyspnea.  The patient uncomfortable appearing, diaphoretic,  tachycardic and tachypneic.  Patient's initial evaluation demonstrates likely fluid overloaded status.  With fever there is some concern for concurrent infection.  Patient received Tylenol, Lasix, nitroglycerin paste.  Patient was admitted to the teaching service for further evaluation and management.    Gerhard Munch, MD 12/08/13 [phone]

## 2013-12-09 DIAGNOSIS — J069 Acute upper respiratory infection, unspecified: Secondary | ICD-10-CM

## 2013-12-09 DIAGNOSIS — I5042 Chronic combined systolic (congestive) and diastolic (congestive) heart failure: Secondary | ICD-10-CM

## 2013-12-09 DIAGNOSIS — I129 Hypertensive chronic kidney disease with stage 1 through stage 4 chronic kidney disease, or unspecified chronic kidney disease: Secondary | ICD-10-CM

## 2013-12-09 DIAGNOSIS — E876 Hypokalemia: Secondary | ICD-10-CM

## 2013-12-09 DIAGNOSIS — I498 Other specified cardiac arrhythmias: Secondary | ICD-10-CM

## 2013-12-09 DIAGNOSIS — E8809 Other disorders of plasma-protein metabolism, not elsewhere classified: Secondary | ICD-10-CM

## 2013-12-09 DIAGNOSIS — I059 Rheumatic mitral valve disease, unspecified: Secondary | ICD-10-CM

## 2013-12-09 DIAGNOSIS — D696 Thrombocytopenia, unspecified: Secondary | ICD-10-CM

## 2013-12-09 DIAGNOSIS — I509 Heart failure, unspecified: Secondary | ICD-10-CM

## 2013-12-09 DIAGNOSIS — F39 Unspecified mood [affective] disorder: Secondary | ICD-10-CM

## 2013-12-09 DIAGNOSIS — J40 Bronchitis, not specified as acute or chronic: Secondary | ICD-10-CM

## 2013-12-09 DIAGNOSIS — Z9889 Other specified postprocedural states: Secondary | ICD-10-CM

## 2013-12-09 LAB — CBC
HEMATOCRIT: 38.8 % — AB (ref 39.0–52.0)
Hemoglobin: 13.1 g/dL (ref 13.0–17.0)
MCH: 27.8 pg (ref 26.0–34.0)
MCHC: 33.8 g/dL (ref 30.0–36.0)
MCV: 82.2 fL (ref 78.0–100.0)
Platelets: 118 10*3/uL — ABNORMAL LOW (ref 150–400)
RBC: 4.72 MIL/uL (ref 4.22–5.81)
RDW: 18.4 % — AB (ref 11.5–15.5)
WBC: 4.4 10*3/uL (ref 4.0–10.5)

## 2013-12-09 LAB — RESPIRATORY VIRUS PANEL
Adenovirus: NOT DETECTED
INFLUENZA B 1: NOT DETECTED
Influenza A H1: NOT DETECTED
Influenza A H3: NOT DETECTED
Influenza A: NOT DETECTED
Metapneumovirus: NOT DETECTED
PARAINFLUENZA 1 A: NOT DETECTED
PARAINFLUENZA 2 A: NOT DETECTED
PARAINFLUENZA 3 A: DETECTED — AB
RESPIRATORY SYNCYTIAL VIRUS B: NOT DETECTED
Respiratory Syncytial Virus A: NOT DETECTED
Rhinovirus: NOT DETECTED

## 2013-12-09 LAB — COMPREHENSIVE METABOLIC PANEL
ALK PHOS: 197 U/L — AB (ref 39–117)
ALT: 25 U/L (ref 0–53)
AST: 30 U/L (ref 0–37)
Albumin: 3.4 g/dL — ABNORMAL LOW (ref 3.5–5.2)
BUN: 31 mg/dL — AB (ref 6–23)
CO2: 19 meq/L (ref 19–32)
Calcium: 9.1 mg/dL (ref 8.4–10.5)
Chloride: 100 mEq/L (ref 96–112)
Creatinine, Ser: 2.68 mg/dL — ABNORMAL HIGH (ref 0.50–1.35)
GFR calc non Af Amer: 26 mL/min — ABNORMAL LOW (ref >90)
GFR, EST AFRICAN AMERICAN: 30 mL/min — AB (ref >90)
GLUCOSE: 140 mg/dL — AB (ref 70–99)
POTASSIUM: 3.4 meq/L — AB (ref 3.7–5.3)
Sodium: 137 mEq/L (ref 137–147)
TOTAL PROTEIN: 8.3 g/dL (ref 6.0–8.3)
Total Bilirubin: 0.6 mg/dL (ref 0.3–1.2)

## 2013-12-09 LAB — TROPONIN I
Troponin I: <0.3 ng/mL (ref <0.30)
Troponin I: <0.3 ng/mL (ref <0.30)

## 2013-12-09 MED ORDER — BENZONATATE 100 MG PO CAPS
100.0000 mg | ORAL_CAPSULE | Freq: Two times a day (BID) | ORAL | Status: DC | PRN
  Administered 2013-12-09 – 2013-12-11 (×4): 100 mg via ORAL
  Filled 2013-12-09 (×4): qty 1

## 2013-12-09 MED ORDER — LEVOFLOXACIN 500 MG PO TABS
500.0000 mg | ORAL_TABLET | ORAL | Status: DC
  Filled 2013-12-09: qty 1

## 2013-12-09 MED ORDER — POTASSIUM CHLORIDE CRYS ER 20 MEQ PO TBCR
40.0000 meq | EXTENDED_RELEASE_TABLET | Freq: Once | ORAL | Status: AC
  Administered 2013-12-09: 40 meq via ORAL
  Filled 2013-12-09: qty 2

## 2013-12-09 MED ORDER — LEVOFLOXACIN 500 MG PO TABS
500.0000 mg | ORAL_TABLET | Freq: Once | ORAL | Status: DC
  Filled 2013-12-09: qty 1

## 2013-12-09 MED ORDER — LEVOFLOXACIN 750 MG PO TABS: 750.0000 mg | ORAL_TABLET | Freq: Every day | ORAL | Status: DC

## 2013-12-09 MED ORDER — LORAZEPAM 2 MG/ML IJ SOLN
1.0000 mg | Freq: Once | INTRAMUSCULAR | Status: AC
  Administered 2013-12-09: 1 mg via INTRAVENOUS
  Filled 2013-12-09: qty 1

## 2013-12-09 MED ORDER — LORAZEPAM 2 MG/ML IJ SOLN
1.0000 mg | INTRAMUSCULAR | Status: DC | PRN
  Administered 2013-12-09: 1 mg via INTRAVENOUS

## 2013-12-09 MED ORDER — LORAZEPAM 2 MG/ML IJ SOLN
1.0000 mg | INTRAMUSCULAR | Status: DC | PRN
  Administered 2013-12-09: 1 mg via INTRAVENOUS
  Filled 2013-12-09: qty 1

## 2013-12-09 MED ORDER — IPRATROPIUM-ALBUTEROL 0.5-2.5 (3) MG/3ML IN SOLN
3.0000 mL | Freq: Four times a day (QID) | RESPIRATORY_TRACT | Status: DC
  Administered 2013-12-09 (×2): 3 mL via RESPIRATORY_TRACT
  Filled 2013-12-09 (×2): qty 3

## 2013-12-09 MED ORDER — IPRATROPIUM-ALBUTEROL 0.5-2.5 (3) MG/3ML IN SOLN
3.0000 mL | Freq: Four times a day (QID) | RESPIRATORY_TRACT | Status: DC | PRN
  Administered 2013-12-10 – 2013-12-12 (×4): 3 mL via RESPIRATORY_TRACT
  Filled 2013-12-09 (×4): qty 3

## 2013-12-09 MED ORDER — LEVOFLOXACIN 500 MG PO TABS: 500.0000 mg | ORAL_TABLET | ORAL | Status: DC

## 2013-12-09 MED ORDER — LEVOFLOXACIN 250 MG PO TABS
250.0000 mg | ORAL_TABLET | Freq: Every day | ORAL | Status: DC
  Administered 2013-12-09: 250 mg via ORAL
  Filled 2013-12-09: qty 1

## 2013-12-09 MED ORDER — LORAZEPAM 0.5 MG PO TABS
0.5000 mg | ORAL_TABLET | Freq: Four times a day (QID) | ORAL | Status: DC | PRN
  Administered 2013-12-09 – 2013-12-11 (×5): 0.5 mg via ORAL
  Filled 2013-12-09 (×5): qty 1

## 2013-12-09 MED ORDER — LORAZEPAM 2 MG/ML IJ SOLN
INTRAMUSCULAR | Status: AC
  Administered 2013-12-09: 1 mg via INTRAVENOUS
  Filled 2013-12-09: qty 1

## 2013-12-09 NOTE — Consult Note (Signed)
Pt. Seen and examined. Agree with the NP/PA-C note as written.  53 yo male with recent bioprosthetic MVR and tricuspid annuloplasty, CHF with EF 25-30% that improved to 50-55%, PAT/PAF, ongoing drug use (UDS +) for cocaine this admission. Presents with URI/Bronchitis picture, however, flu has not been ruled-out.  BNP elevated, but around baseline and creatinine stable. Neck veins are distended. He does appear mildly volume overloaded. His breathing has improved somewhat, as has his cough. I agree with restarting his home lasix. I reviewed his telemetry today and it is full of artifact (no clear recurrent flutter or fib was noted).  Thanks for consulting us.  Chrystie NoseKenneth C. Hilty, MD, [facility] Attending Cardiologist [facility]

## 2013-12-09 NOTE — H&P (Signed)
Internal Medicine Attending Admission Note Date: 12/09/2013  Patient name: Ryan Winters Medical record number: [id number] Date of birth: 10/30/1961 Age: 53 y.o. Gender: male  I saw and evaluated the patient. I reviewed the resident's note and I agree with the resident's findings and plan as documented in the resident's note, with the following additional comments.  Chief Complaint(s): Cough and shortness of breath  History - key components related to admission: Patient is a 53 year old male with history of congestive heart failure,  atrial tachycardia, history of mitral valve repair, status post bioprosthetic mitral valve replacement and tricuspid valve repair, chronic kidney disease, and other problems as outlined in medical history admitted with complaint of cough and shortness of breath.   Physical Exam - key components related to admission:  Temp (48hrs), Avg:98.4 F (36.9 C), Min:97.4 F (36.3 C), Max:101.6 F (38.7 C)    Filed Vitals:   12/09/13 0853 12/09/13 0922 12/09/13 1324 12/09/13 1400  BP: 97/59   97/50  Pulse: 55   55  Temp: 97.7 F (36.5 C)   97.4 F (36.3 C)  TempSrc: Oral   Oral  Resp: 18   18  Height:      Weight:      SpO2: 96% 97% 96% 98%   General: Alert, no distress Lungs: Bibasilar crackles Heart: Regular; no extra sounds or murmurs Abdomen: Bowel sounds present, soft, nontender Extremities: No edema  Lab results:   Basic Metabolic Panel:  Recent Labs  40/98/11 1421 12/09/13 0612  NA 138 137  K 4.4 3.4*  CL 104 100  CO2 21 19  GLUCOSE 99 140*  BUN 25* 31*  CREATININE 2.77* 2.68*  CALCIUM 8.7 9.1    Liver Function Tests:  Recent Labs  12/09/13 0612  AST 30  ALT 25  ALKPHOS 197*  BILITOT 0.6  PROT 8.3  ALBUMIN 3.4*     CBC:  Recent Labs  12/08/13 1421 12/09/13 0612  WBC 4.9 4.4  HGB 13.1 13.1  HCT 38.9* 38.8*  MCV 82.8 82.2  PLT 126* 118*     Cardiac Enzymes:  Recent Labs  12/08/13 1850 12/09/13 0023  12/09/13 0612  TROPONINI <0.30 <0.30 <0.30    BNP:  Recent Labs  12/08/13 1338  PROBNP 4938.0*     Urine Drug Screen: Drugs of Abuse     Component Value Date/Time   LABOPIA NONE DETECTED 12/08/2013 1905   COCAINSCRNUR POSITIVE* 12/08/2013 1905   LABBENZ NONE DETECTED 12/08/2013 1905   AMPHETMU NONE DETECTED 12/08/2013 1905   THCU NONE DETECTED 12/08/2013 1905   LABBARB NONE DETECTED 12/08/2013 1905       Imaging results:  Dg Chest 2 View  12/08/2013   CLINICAL DATA:  Chest pain and shortness of breath  EXAM: CHEST  2 VIEW  COMPARISON:  November 21, 2013  FINDINGS: There is trace interstitial edema. There is no airspace consolidation. Heart is enlarged with pulmonary venous hypertension. No adenopathy. Patient is status post mitral and tricuspid valve replacements. No bony lesions.  IMPRESSION: Evidence of a degree of congestive heart failure.  No consolidation.   Electronically Signed   By: Bretta Bang M.D.   On: 12/08/2013 15:16     Assessment & Plan by Problem:  1.  Acute bronchitis, possible influenza.  Patient presents with flulike symptoms (fever, cough, shortness of breath), and reports recent exposure to family members with similar illness.  Influenza panel is pending.  Plan is empiric Tamiflu, empiric levofloxacin, await cultures and respiratory virus panel.  2.  Congestive heart failure.  Patient's chest x-ray shows evidence of congestive heart failure.  Plan is diurese; follow in/outs, electrolytes, and renal function.  3.  Arrhythmia.  Patient's initial EKGs show a tachycardia which appears to be atrial flutter/atrial fibrillation.  Given urine positive for cocaine, this may have been a contributing factor. Cardiology was consulted; would discuss with them whether further workup of the arrhythmia is warranted.  For now, monitor on telemetry.  4.  Other problems and plans as per the resident physician's note.

## 2013-12-09 NOTE — Consult Note (Signed)
Reason for Consult: CHF Referring Physician: Internal Medicine   HPI: The patient is a 53 y/o male, followed by Dr. Burt Knack. He has valvular heart disease and underwent mitral valve repair a few years ago. He continued to have heart failure symptoms with recurrent severe MR. He underwent redo cardiac surgery with bioprosthetivc MVR and tricuspid annuloplasty on November 5th 2013. He underwent a LHC in 2013, prior to redo MVR, that demonstrated mild nonobstructive CAD. He also has a history of atrial tachycardia/atypical atrial flutter in 11/2012, treated with TEE cardioversion. A 2D echo, at that time, demonstrated severe systolic dysfunction with an EF of 25-30%. A follow-up 2D echo in 03/2012, demonstrated improvement in systolic function, with an EF of 50-55% w/o WMA.  He had a pattern of moderate LVH. A bioprosthesis was present and functioning normally. There was mild regurgitation. His other issues include severe malignant HTN and Stage IV CKD (followed by [facility]; not on HD). He has a history of chronic polysubstance abuse (UDS this admission + for cocaine and benzodiazepines).  He presented to [facility] yesterday with a complaint of a productive cough, with brown colored sputum and SOB, which first began yesterday. He has had exposure to sick contacts. No fever, chills, n/v. He notes chest discomfort, but only when he coughs. He states that he has been unable to sleep supine, since undergoing surgery for his mitral valve. He sleeps with several pillows every night. He denies any LEE and weight gain. He reports daily compliance with BID Lasix and adherence with a low sodium diet.      W/U so far has revealed cardiac enzymes negative x 3.  BNP is elevated at 4,938. CXR shows trace interstitial edema. The heart is enlarged with pulmonary venous hypertension. SCr is 2.77 (baseline 2.7-3.7). He received a dose of 40 mg of IV Lasix in ER yesterday and was resumed on his home does of Lasix today.      Past Medical History  Diagnosis Date  . Hypertension   . Bipolar disorder   . Schizophrenia     h/o hospitalized inpt therapy @ the [facility]  . Valvular heart disease     a. 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 b. 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  Marland Kitchen Hyperlipidemia   . H/O: suicide attempt 1998    was hospitalized at [facility]  . Anemia     Hx of iron infusions  . Gastroparesis   . Arthritis   . Cocaine abuse     per pt none x 3 yrs however continues to "handle" it and deal it with + UDS.  Marland Kitchen Anxiety   . CHF (congestive heart failure)     a. EF 25-30% by echo 12/02/12, but 55% by TEE 12/04/12.  Marland Kitchen GERD (gastroesophageal reflux disease)   . Depression   . Vertigo   . Itchy skin   . Peripheral neuropathy     BLE  . Bilateral lower abdominal cramping   . Shortness of breath     "periodically; happens at any time" (03/30/13)  . Generalized headaches     "monthly" (03/30/2013)  . Chronic kidney disease     has fistula, not on HD; "go to [facility] q 90 days to have kidneys checked out" (03/30/2013)  . Atrial tachycardia     a. dx 11/2012: tx with amiodarone, cardioversion (discharge rhythm: likely junctional).    Past Surgical History  Procedure Laterality Date  . Mitral valve repair  05/2010  . Av fistula placement, radiocephalic Left 34/74/2595    arm; "aien't never had to use it" (03/30/2013)  . Laceration repair Right ~ 1979  . Umbilical hernia repair  04/01/2012    Procedure: HERNIA REPAIR UMBILICAL ADULT;  Surgeon: Adin Hector, MD;  Location: [place];  Service: General;  Laterality: N/A;  supra umbilical  . Tee without cardioversion  08/25/2012    Procedure: TRANSESOPHAGEAL ECHOCARDIOGRAM (TEE);  Surgeon: Josue Hector, MD;  Location: [facility] ENDOSCOPY;  Service: Cardiovascular;  Laterality: N/A;  . Mitral valve replacement  09/23/2012    Procedure: REDO MITRAL VALVE REPLACEMENT (MVR);  Surgeon: Ivin Poot, MD;   Location: [place];  Service: Open Heart Surgery;  Laterality: N/A;  Nitric Oxide  . Tricuspid valve replacement  09/23/2012    Procedure: TRICUSPID VALVE REPAIR;  Surgeon: Ivin Poot, MD;  Location: [place];  Service: Open Heart Surgery;  Laterality: N/A;  . Tee without cardioversion  12/04/2012    Procedure: TRANSESOPHAGEAL ECHOCARDIOGRAM (TEE);  Surgeon: Larey Dresser, MD;  Location: [facility] ENDOSCOPY;  Service: Cardiovascular;  Laterality: N/A;  CV 1015/vicki, dl  . Cardioversion  12/04/2012    Procedure: CARDIOVERSION;  Surgeon: Larey Dresser, MD;  Location: [facility] ENDOSCOPY;  Service: Cardiovascular;  Laterality: N/A;  . Cardiac valve replacement      mitral  . Cardiac catheterization  05/26/2010    Archie Endo 03/30/2013    Family History  Problem Relation Age of Onset  . Hypertension Mother   . Heart disease Mother   . Heart disease Brother     Social History:  reports that he quit smoking about 2 years ago. His smoking use included Cigarettes. He has a 45 pack-year smoking history. He has quit using smokeless tobacco. He reports that he uses illicit drugs (Cocaine and Marijuana). He reports that he does not drink alcohol.  Allergies:  Allergies  Allergen Reactions  . Cashew Nut Oil Anaphylaxis  . Food Anaphylaxis    Strawberries, Cashews, causes throat to swell - affects breathing    Medications:  Prior to Admission medications   Medication Sig Start Date End Date Taking? Authorizing Provider  acetaminophen (TYLENOL) 325 MG tablet Take 650 mg by mouth every 6 (six) hours as needed for pain.   Yes Historical Provider, MD  albuterol (PROVENTIL HFA;VENTOLIN HFA) 108 (90 BASE) MCG/ACT inhaler Inhale 2 puffs into the lungs every 6 (six) hours as needed for wheezing.   Yes Historical Provider, MD  ARIPiprazole (ABILIFY) 5 MG tablet Take 5 mg by mouth daily.    Yes Historical Provider, MD  aspirin EC 81 MG tablet Take 81 mg by mouth daily.   Yes Historical Provider, MD  carvedilol (COREG) 25  MG tablet Take 25 mg by mouth 2 (two) times daily with a meal.   Yes Historical Provider, MD  diltiazem (CARDIZEM SR) 120 MG 12 hr capsule Take 2 capsules (240 mg total) by mouth every 12 (twelve) hours. 11/27/13  Yes Clinton Gallant, MD  furosemide (LASIX) 40 MG tablet Take 40 mg by mouth 2 (two) times daily.   Yes Historical Provider, MD  hydrALAZINE (APRESOLINE) 100 MG tablet Take 100 mg by mouth 2 (two) times daily.   Yes Historical Provider, MD  hydrocortisone cream 1 % Apply topically as needed for itching. 11/22/13  Yes Jerene Pitch, MD  metoCLOPramide (REGLAN) 10 MG tablet Take 1 tablet (10 mg total) by mouth 4 (four) times daily. 09/14/13  Yes Michail Jewels, MD  omeprazole (  PRILOSEC) 40 MG capsule Take 40 mg by mouth 2 (two) times daily as needed (heartburn).   Yes Historical Provider, MD      Results for orders placed during the hospital encounter of 12/08/13 (from the past 48 hour(s))  PRO B NATRIURETIC PEPTIDE     Status: Abnormal   Collection Time    12/08/13  1:38 PM      Result Value Range   Pro B Natriuretic peptide (BNP) 4938.0 (*) 0 - 125 pg/mL  CBC     Status: Abnormal   Collection Time    12/08/13  2:21 PM      Result Value Range   WBC 4.9  4.0 - 10.5 K/uL   RBC 4.70  4.22 - 5.81 MIL/uL   Hemoglobin 13.1  13.0 - 17.0 g/dL   HCT 38.9 (*) 39.0 - 52.0 %   MCV 82.8  78.0 - 100.0 fL   MCH 27.9  26.0 - 34.0 pg   MCHC 33.7  30.0 - 36.0 g/dL   RDW 18.5 (*) 11.5 - 15.5 %   Platelets 126 (*) 150 - 400 K/uL  BASIC METABOLIC PANEL     Status: Abnormal   Collection Time    12/08/13  2:21 PM      Result Value Range   Sodium 138  137 - 147 mEq/L   Potassium 4.4  3.7 - 5.3 mEq/L   Chloride 104  96 - 112 mEq/L   CO2 21  19 - 32 mEq/L   Glucose, Bld 99  70 - 99 mg/dL   BUN 25 (*) 6 - 23 mg/dL   Creatinine, Ser 2.77 (*) 0.50 - 1.35 mg/dL   Calcium 8.7  8.4 - 10.5 mg/dL   GFR calc non Af Amer 25 (*) >90 mL/min   GFR calc Af Amer 29 (*) >90 mL/min   Comment: (NOTE)     The eGFR  has been calculated using the CKD EPI equation.     This calculation has not been validated in all clinical situations.     eGFR's persistently <90 mL/min signify possible Chronic Kidney     Disease.  CG4 I-STAT (LACTIC ACID)     Status: None   Collection Time    12/08/13  2:32 PM      Result Value Range   Lactic Acid, Venous 0.94  0.5 - 2.2 mmol/L  TROPONIN I     Status: None   Collection Time    12/08/13  6:50 PM      Result Value Range   Troponin I <0.30  <0.30 ng/mL   Comment:            Due to the release kinetics of cTnI,     a negative result within the first hours     of the onset of symptoms does not rule out     myocardial infarction with certainty.     If myocardial infarction is still suspected,     repeat the test at appropriate intervals.  URINE RAPID DRUG SCREEN (HOSP PERFORMED)     Status: Abnormal   Collection Time    12/08/13  7:05 PM      Result Value Range   Opiates NONE DETECTED  NONE DETECTED   Cocaine POSITIVE (*) NONE DETECTED   Benzodiazepines NONE DETECTED  NONE DETECTED   Amphetamines NONE DETECTED  NONE DETECTED   Tetrahydrocannabinol NONE DETECTED  NONE DETECTED   Barbiturates NONE DETECTED  NONE DETECTED   Comment:  DRUG SCREEN FOR MEDICAL PURPOSES     ONLY.  IF CONFIRMATION IS NEEDED     FOR ANY PURPOSE, NOTIFY LAB     WITHIN 5 DAYS.                LOWEST DETECTABLE LIMITS     FOR URINE DRUG SCREEN     Drug Class       Cutoff (ng/mL)     Amphetamine      1000     Barbiturate      200     Benzodiazepine   970     Tricyclics       263     Opiates          300     Cocaine          300     THC              50  TROPONIN I     Status: None   Collection Time    12/09/13 12:23 AM      Result Value Range   Troponin I <0.30  <0.30 ng/mL   Comment:            Due to the release kinetics of cTnI,     a negative result within the first hours     of the onset of symptoms does not rule out     myocardial infarction with certainty.      If myocardial infarction is still suspected,     repeat the test at appropriate intervals.  TROPONIN I     Status: None   Collection Time    12/09/13  6:12 AM      Result Value Range   Troponin I <0.30  <0.30 ng/mL   Comment:            Due to the release kinetics of cTnI,     a negative result within the first hours     of the onset of symptoms does not rule out     myocardial infarction with certainty.     If myocardial infarction is still suspected,     repeat the test at appropriate intervals.  COMPREHENSIVE METABOLIC PANEL     Status: Abnormal   Collection Time    12/09/13  6:12 AM      Result Value Range   Sodium 137  137 - 147 mEq/L   Potassium 3.4 (*) 3.7 - 5.3 mEq/L   Chloride 100  96 - 112 mEq/L   CO2 19  19 - 32 mEq/L   Glucose, Bld 140 (*) 70 - 99 mg/dL   BUN 31 (*) 6 - 23 mg/dL   Creatinine, Ser 2.68 (*) 0.50 - 1.35 mg/dL   Calcium 9.1  8.4 - 10.5 mg/dL   Total Protein 8.3  6.0 - 8.3 g/dL   Albumin 3.4 (*) 3.5 - 5.2 g/dL   AST 30  0 - 37 U/L   ALT 25  0 - 53 U/L   Alkaline Phosphatase 197 (*) 39 - 117 U/L   Total Bilirubin 0.6  0.3 - 1.2 mg/dL   GFR calc non Af Amer 26 (*) >90 mL/min   GFR calc Af Amer 30 (*) >90 mL/min   Comment: (NOTE)     The eGFR has been calculated using the CKD EPI equation.     This calculation has not been validated in all clinical situations.     eGFR's persistently <90  mL/min signify possible Chronic Kidney     Disease.  CBC     Status: Abnormal   Collection Time    12/09/13  6:12 AM      Result Value Range   WBC 4.4  4.0 - 10.5 K/uL   RBC 4.72  4.22 - 5.81 MIL/uL   Hemoglobin 13.1  13.0 - 17.0 g/dL   HCT 38.8 (*) 39.0 - 52.0 %   MCV 82.2  78.0 - 100.0 fL   MCH 27.8  26.0 - 34.0 pg   MCHC 33.8  30.0 - 36.0 g/dL   RDW 18.4 (*) 11.5 - 15.5 %   Platelets 118 (*) 150 - 400 K/uL   Comment: PLATELET COUNT CONFIRMED BY SMEAR    Dg Chest 2 View  12/08/2013   CLINICAL DATA:  Chest pain and shortness of breath  EXAM: CHEST  2 VIEW   COMPARISON:  November 21, 2013  FINDINGS: There is trace interstitial edema. There is no airspace consolidation. Heart is enlarged with pulmonary venous hypertension. No adenopathy. Patient is status post mitral and tricuspid valve replacements. No bony lesions.  IMPRESSION: Evidence of a degree of congestive heart failure.  No consolidation.   Electronically Signed   By: Lowella Grip M.D.   On: 12/08/2013 15:16    Review of Systems  Constitutional: Positive for malaise/fatigue. Negative for fever and chills.  Respiratory: Positive for cough, sputum production and shortness of breath.   Cardiovascular: Positive for chest pain (chest wall discomfort when coughing. No pain at rest) and orthopnea. Negative for palpitations, leg swelling and PND.  All other systems reviewed and are negative.   Blood pressure 97/59, pulse 55, temperature 97.7 F (36.5 C), temperature source Oral, resp. rate 18, height 5' 6" (1.676 m), weight 149 lb 9.6 oz (67.858 kg), SpO2 96.00%. Physical Exam  Constitutional: He is oriented to person, place, and time. He appears well-developed and well-nourished. No distress.  Neck: JVD present.  Cardiovascular: Normal rate, regular rhythm and intact distal pulses.  Exam reveals no gallop and no friction rub.   Murmur heard. Respiratory: He has wheezes (bilaterally w/ rhonchi).  Slightly labored. On [place]   Musculoskeletal: He exhibits no edema.  Neurological: He is alert and oriented to person, place, and time.  Skin: Skin is dry. He is not diaphoretic.  Psychiatric: He has a normal mood and affect. His behavior is normal.    Assessment/Plan: Principal Problem:   Bronchitis Active Problems:   Hypertension   CKD (chronic kidney disease), stage IV   GERD (gastroesophageal reflux disease)   Gastroparesis   Schizophrenia   Chronic combined systolic and diastolic CHF (congestive heart failure)   Dyspnea   Plan: 53 y/o AAM w/ h/o MVR in 2013, atrial flutter, s/p  TEE/DCCV 1 year ago, combined systlolic/diastolic HF, CKD, HTN and polysubstance abuse (+ for cocaine), admitted primarily for bronchitis. We were consulted for CHF. BNP was almost 5K on admission. However, review of BNP trends show that he has had persistently elevated BNPs over the past year, the highest being 15K. This is also in the setting of stage IV CKD.  His CXR yesterday showed trace interstitial edema. He was given 40 mg IV Lasix in ER and is now on his home dose of PO Lasix. Little diuresis based on I/Os, however I question if these are accurate. He has distended neck veins, but otherwise has no significant peripheral edema. I agree with resuming PO Lasix. His SCr is 2.68 which is around  his baseline. Continue to monitor renal function. BP is stable. Resume Coreg and hydralazine. Also continue Cardizem. Telemetry strip shows possible slow atrial flutter around 8:46 am today. He is currently in NSR. Continue with telemetry monitoring. MD to follow with further recommendations.   Charlita Brian 12/09/2013, 1:34 PM

## 2013-12-09 NOTE — Progress Notes (Addendum)
Subjective:  Pt seen and examined in AM. Last night pt with asymptomatic HR of 170 and given Ativan with  Pt reports feeling better with improved dyspnea and productive yellow cough. He continues to have rhinorrhea and nasal congestion. He reports being around children that were sick recently. He denies fever, chills, wheezing, palpitations, diaphoresis, nausea, vomiting, abdominal pain, change in BM or urination.    Objective: Vital signs in last 24 hours: Filed Vitals:   12/09/13 0126 12/09/13 0557 12/09/13 0853 12/09/13 0922  BP: 133/78 119/54 97/59   Pulse: 145 85 55   Temp: 97.9 F (36.6 C) 97.4 F (36.3 C) 97.7 F (36.5 C)   TempSrc: Oral Oral Oral   Resp: 20 18 18    Height:      Weight:  67.858 kg (149 lb 9.6 oz)    SpO2: 95% 99% 96% 97%   Weight change:   Intake/Output Summary (Last 24 hours) at 12/09/13 0959 Last data filed at 12/09/13 0854  Gross per 24 hour  Intake    814 ml  Output    450 ml  Net    364 ml   General: Well-developed, well-nourished Head: Normocephalic, atraumatic. Eyes: EOMI Neck: supple, JVD present Lungs: Normal respiratory effort. Clear to auscultation bilaterally from apices to bases without crackles or wheezes appreciated. Just received breathing treatment Heart: Normal regular rhythm, normal S1 and S2, + murmur 2/6 Abdomen: BS normoactive. Soft, Nondistended, non-tender. No masses or organomegaly appreciated. Extremities: No LE edema, distal pulses intact Neurologic: alert and oriented x3   Lab Results: Basic Metabolic Panel:  Recent Labs Lab 12/08/13 1421 12/09/13 0612  NA 138 137  K 4.4 3.4*  CL 104 100  CO2 21 19  GLUCOSE 99 140*  BUN 25* 31*  CREATININE 2.77* 2.68*  CALCIUM 8.7 9.1   Liver Function Tests:  Recent Labs Lab 12/09/13 0612  AST 30  ALT 25  ALKPHOS 197*  BILITOT 0.6  PROT 8.3  ALBUMIN 3.4*   CBC:  Recent Labs Lab 12/08/13 1421 12/09/13 0612  WBC 4.9 4.4  HGB 13.1 13.1  HCT 38.9* 38.8*    MCV 82.8 82.2  PLT 126* 118*   Cardiac Enzymes:  Recent Labs Lab 12/08/13 1850 12/09/13 0023 12/09/13 0612  TROPONINI <0.30 <0.30 <0.30   BNP:  Recent Labs Lab 12/08/13 1338  PROBNP 4938.0*   Urine Drug Screen: Drugs of Abuse     Component Value Date/Time   LABOPIA NONE DETECTED 12/08/2013 1905   LABOPIA NEGATIVE 05/10/2012 0433   COCAINSCRNUR POSITIVE* 12/08/2013 1905   COCAINSCRNUR POSITIVE* 05/10/2012 0433   LABBENZ NONE DETECTED 12/08/2013 1905   LABBENZ NEGATIVE 05/10/2012 0433   AMPHETMU NONE DETECTED 12/08/2013 1905   AMPHETMU NEGATIVE 05/10/2012 0433   THCU NONE DETECTED 12/08/2013 1905   LABBARB NONE DETECTED 12/08/2013 1905    Studies/Results: Dg Chest 2 View  12/08/2013   CLINICAL DATA:  Chest pain and shortness of breath  EXAM: CHEST  2 VIEW  COMPARISON:  November 21, 2013  FINDINGS: There is trace interstitial edema. There is no airspace consolidation. Heart is enlarged with pulmonary venous hypertension. No adenopathy. Patient is status post mitral and tricuspid valve replacements. No bony lesions.  IMPRESSION: Evidence of a degree of congestive heart failure.  No consolidation.   Electronically Signed   By: Bretta Bang M.D.   On: 12/08/2013 15:16   Medications: I have reviewed the patient's current medications. Scheduled Meds: . ARIPiprazole  5 mg Oral Daily  .  aspirin EC  81 mg Oral Daily  . carvedilol  25 mg Oral BID WC  . diltiazem  240 mg Oral Q12H  . furosemide  40 mg Oral BID  . heparin  5,000 Units Subcutaneous Q8H  . hydrALAZINE  100 mg Oral BID  . ipratropium-albuterol  3 mL Nebulization QID  . levofloxacin  250 mg Oral Daily  . methylPREDNISolone (SOLU-MEDROL) injection  80 mg Intravenous Daily  . metoCLOPramide  10 mg Oral QID  . oseltamivir  30 mg Oral Q24H  . pantoprazole  40 mg Oral Daily  . potassium chloride SA  40 mEq Oral Once  . sodium chloride  3 mL Intravenous Q12H  . sodium chloride  3 mL Intravenous Q12H   Continuous  Infusions:  PRN Meds:.sodium chloride, acetaminophen, guaiFENesin-dextromethorphan, hydrocortisone cream, LORazepam, sodium chloride Assessment/Plan:  Acute Respiratory Infection - improving. Etiology most likely viral in nature. Pt with recent sick contact who presented with fever, dyspnea, wheezing, and chest xray findings of trace interstitial edema with no consolidation that was concerning for influenza vs acute bronchitis vs atypical pneumonia. Pt received PO 60 mg prednisone and solumedrol IV 80 mg on 1/20 and 3rd dose of IV solumedrol on 1/21. -Oxygen therapy as needed to keep SpO2>92% -Transition IV 750mg  Levofloxacin to PO per pharmacy 500 mg Q 48h  (received PO 750 mg today) -Breathing treatment (ipatropium/albuterol) Q 6hr PRN -Pt completed 3 doses of corticosteroid therapy for suspected inflammation (bronchitis) -Robitussin Q 4 hr PRN for cough -Awaiting respiratory panel, sputum culture -Continue Day 2/5 Tamiflu 30 mg daily, d/c if influenza (-) -Consider repeat chest xray if symptoms do not improve -Consider outpatient PFT's in setting of smoking history   Atrial flutter/fibrillation in setting of ectopic atrial tachycardia - CP free, currently in NSR and bradycardic. CE's within normal limit. Cardiac catheter on 08/28/12 revealed non-obstructive disease in left mainstem and minimal nonobstructive plaque in left anterior descending (LAD).  -Continue PO diltiazem 240 mg BID  -Continue home carvedilol 25 mg BD  -Continue hydralazine 100 mg BID  -Nitroglycerine PRN  -Aspirin 81 mg daily  -Monitor on telemetry -Appreciate cardiology recommendations --> EKG with artifact  Hypokalemia - Pt with potassium of 3.4 on 1/21. Etiology most likely due to albuterol breathing treatment. -Obtain Mg level -Repete with PO Potassium Chloride, caution in setting of CKD Stage 4 -Continue to monitor BMP -Breathing treatment Q 6 hr PRN  History of Congestive Heart Failure - currently with mild  volume overload. 2D-echo on 03/31/14 with normal systolic function, EF 50-55%. Pro-BNP elevated at 4938 (1/2 was 9651) with pulmonary vascular congestion on CXR.  -Contine home lasix 40 mg BID  -Continue home carvedilol 25 mg BD  -Monitor daily weight and I&Os  -Appreciate cardiology recommendations --> mild volume overload, continue home furosemide   Hypertension - currently normotensive  -Continue diltiazem 240 mg BID  -Continue home hydralazine 100 mg tBID.  -Continue home lasix 40 mg BID  -Continue home carvedilol 25 mg BID   CKD Stage 4 - Cr 2.68 below baseline Cr 2.7-3.7.Pt with A-V fistula on the left forearm which is functioning.  -Monitor daily weight and I & O's -Continue to monitor renal function  Chronic Thrombocytopenia - Pt with platelet count on admission of 126K near baseline with no reported active bleeding.  -Monitor for bleeding  Hypoalbuminemia - mildly low albumin 3.4. 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  Anticipated discharge in approximately 1 day(s).   The patient does have a current PCP Pleas Koch(Christopher Komanski, MD) and [facility] after discharge.  The patient does not have transportation limitations that hinder transportation to clinic  appointments.  .Services Needed at time of discharge: Y = Yes, Blank = No PT:   OT:   RN:   Equipment:   Other:     LOS: 1 day   Otis BraceMarjan Ark Agrusa, MD 12/09/2013, 9:59 AM

## 2013-12-10 ENCOUNTER — Encounter (HOSPITAL_COMMUNITY): Payer: Self-pay

## 2013-12-10 DIAGNOSIS — J111 Influenza due to unidentified influenza virus with other respiratory manifestations: Secondary | ICD-10-CM

## 2013-12-10 DIAGNOSIS — N184 Chronic kidney disease, stage 4 (severe): Secondary | ICD-10-CM

## 2013-12-10 DIAGNOSIS — I5033 Acute on chronic diastolic (congestive) heart failure: Secondary | ICD-10-CM | POA: Diagnosis present

## 2013-12-10 DIAGNOSIS — B348 Other viral infections of unspecified site: Secondary | ICD-10-CM | POA: Diagnosis present

## 2013-12-10 DIAGNOSIS — Z954 Presence of other heart-valve replacement: Secondary | ICD-10-CM

## 2013-12-10 DIAGNOSIS — I4892 Unspecified atrial flutter: Secondary | ICD-10-CM | POA: Diagnosis present

## 2013-12-10 DIAGNOSIS — N179 Acute kidney failure, unspecified: Secondary | ICD-10-CM

## 2013-12-10 DIAGNOSIS — F1410 Cocaine abuse, uncomplicated: Secondary | ICD-10-CM

## 2013-12-10 DIAGNOSIS — B9789 Other viral agents as the cause of diseases classified elsewhere: Principal | ICD-10-CM

## 2013-12-10 DIAGNOSIS — Z952 Presence of prosthetic heart valve: Secondary | ICD-10-CM

## 2013-12-10 LAB — BASIC METABOLIC PANEL
BUN: 56 mg/dL — AB (ref 6–23)
BUN: 59 mg/dL — ABNORMAL HIGH (ref 6–23)
CHLORIDE: 102 meq/L (ref 96–112)
CO2: 19 mEq/L (ref 19–32)
CO2: 19 mEq/L (ref 19–32)
CREATININE: 4.19 mg/dL — AB (ref 0.50–1.35)
Calcium: 9 mg/dL (ref 8.4–10.5)
Calcium: 8.7 mg/dL (ref 8.4–10.5)
Chloride: 101 mEq/L (ref 96–112)
Creatinine, Ser: 4.11 mg/dL — ABNORMAL HIGH (ref 0.50–1.35)
GFR calc Af Amer: 17 mL/min — ABNORMAL LOW (ref >90)
GFR calc non Af Amer: 15 mL/min — ABNORMAL LOW (ref >90)
GFR, EST AFRICAN AMERICAN: 18 mL/min — AB (ref >90)
GFR, EST NON AFRICAN AMERICAN: 15 mL/min — AB (ref >90)
Glucose, Bld: 138 mg/dL — ABNORMAL HIGH (ref 70–99)
Glucose, Bld: 188 mg/dL — ABNORMAL HIGH (ref 70–99)
POTASSIUM: 4.9 meq/L (ref 3.7–5.3)
Potassium: 4.7 mEq/L (ref 3.7–5.3)
SODIUM: 137 meq/L (ref 137–147)
SODIUM: 135 meq/L — AB (ref 137–147)

## 2013-12-10 LAB — MAGNESIUM: Magnesium: 2 mg/dL (ref 1.5–2.5)

## 2013-12-10 MED ORDER — DILTIAZEM HCL ER 60 MG PO CP12
120.0000 mg | ORAL_CAPSULE | Freq: Two times a day (BID) | ORAL | Status: DC
  Administered 2013-12-10 – 2013-12-11 (×3): 120 mg via ORAL
  Filled 2013-12-10 (×4): qty 2

## 2013-12-10 NOTE — Progress Notes (Signed)
TELEMETRY: Reviewed telemetry pt in atrial flutter with a slow ventricular response. Rate 45-50 bpm: Filed Vitals:   12/09/13 1400 12/09/13 2049 12/10/13 0144 12/10/13 0703  BP: 97/50 106/59 106/67 97/65  Pulse: 55 50 53 51  Temp: 97.4 F (36.3 C) 97.5 F (36.4 C) 97.7 F (36.5 C) 97.7 F (36.5 C)  TempSrc: Oral Oral Oral Oral  Resp: 18 18 22 18   Height:      Weight:    154 lb 14.4 oz (70.262 kg)  SpO2: 98% 95% 95% 99%    Intake/Output Summary (Last 24 hours) at 12/10/13 9604 Last data filed at 12/10/13 5409  Gross per 24 hour  Intake    678 ml  Output      0 ml  Net    678 ml    SUBJECTIVE The patient is a 53 y/o male, followed by Dr. Excell Seltzer. He has valvular heart disease and underwent mitral valve repair a few years ago. He continued to have heart failure symptoms with recurrent severe MR. He underwent redo cardiac surgery with bioprosthetivc MVR and tricuspid annuloplasty on November 5th 2013. He underwent a LHC in 2013, prior to redo MVR, that demonstrated mild nonobstructive CAD. He also has a history of atrial tachycardia/atypical atrial flutter in 11/2012, treated with TEE cardioversion. A 2D echo, at that time, demonstrated severe systolic dysfunction with an EF of 25-30%. A follow-up 2D echo in 03/2012, demonstrated improvement in systolic function, with an EF of 50-55% w/o WMA. He had a pattern of moderate LVH. A bioprosthesis was present and functioning normally. There was mild regurgitation. His other issues include severe malignant HTN and Stage IV CKD (followed by [facility]; not on HD). He has a history of chronic polysubstance abuse (UDS this admission + for cocaine and benzodiazepines).  Today patient denies any chest pain. Still with a dry cough. Afebrile. Mild SOB. No edema.  LABS: Basic Metabolic Panel:  Recent Labs  81/19/14 0612 12/10/13 0328  NA 137 137  K 3.4* 4.9  CL 100 102  CO2 19 19  GLUCOSE 140* 138*  BUN 31* 56*  CREATININE 2.68* 4.11*    CALCIUM 9.1 9.0  MG  --  2.0   Liver Function Tests:  Recent Labs  12/09/13 0612  AST 30  ALT 25  ALKPHOS 197*  BILITOT 0.6  PROT 8.3  ALBUMIN 3.4*   No results found for this basename: LIPASE, AMYLASE,  in the last 72 hours CBC:  Recent Labs  12/08/13 1421 12/09/13 0612  WBC 4.9 4.4  HGB 13.1 13.1  HCT 38.9* 38.8*  MCV 82.8 82.2  PLT 126* 118*   Cardiac Enzymes:  Recent Labs  12/08/13 1850 12/09/13 0023 12/09/13 0612  TROPONINI <0.30 <0.30 <0.30   BNP: 4938   Radiology/Studies:  Dg Chest 2 View  12/08/2013   CLINICAL DATA:  Chest pain and shortness of breath  EXAM: CHEST  2 VIEW  COMPARISON:  November 21, 2013  FINDINGS: There is trace interstitial edema. There is no airspace consolidation. Heart is enlarged with pulmonary venous hypertension. No adenopathy. Patient is status post mitral and tricuspid valve replacements. No bony lesions.  IMPRESSION: Evidence of a degree of congestive heart failure.  No consolidation.   Electronically Signed   By: Bretta Bang M.D.   On: 12/08/2013 15:16   Dg Chest 2 View  11/21/2013   CLINICAL DATA:  Left-sided chest pain  EXAM: CHEST  2 VIEW  COMPARISON:  11/20/2013  FINDINGS: Bilateral  diffuse interstitial thickening and peribronchial cuffing most concerning for bronchitis. There is a trace left pleural effusion. Is noted and a right pleural effusion. There is no pneumothorax. Stable cardiomegaly with mitral valve and tricuspid valve replacement. The osseous structures are unremarkable.  IMPRESSION: Bilateral diffuse interstitial thickening and peribronchial cuffing most concerning for bronchitis.   Electronically Signed   By: Elige KoHetal  Patel   On: 11/21/2013 11:58   Dg Chest Portable 1 View  11/20/2013   CLINICAL DATA:  Chest pain, shortness of breath  EXAM: PORTABLE CHEST - 1 VIEW  COMPARISON:  09/13/2013.  FINDINGS: Low lung volumes. Cardiac silhouette is enlarged. Patient status post median sternotomy mitral and aortic valve  replacement. Minimal areas increased density projects within the lung bases. No focal regions of consolidation identified. Osseous structures unremarkable.  IMPRESSION: Atelectasis versus early or mild infiltrates in the lung bases. No focal regions of consolidation. With technique taking into consideration stable cardiomegaly.   Electronically Signed   By: Salome HolmesHector  Cooper M.D.   On: 11/20/2013 09:05   Ecg: 12/08/13 shows atrial flutter with rapid ventricular response. LVH with repolarization abnormality.  PHYSICAL EXAM General: Well developed, thin, in no acute distress. Head: Normal Neck: Negative for carotid bruits. JVD mildly elevated. Lungs: mild bilateral wheezes. Breathing is unlabored. Heart: RRR S1 S2 with 1-2/6 systolic murmur LSB.  Abdomen: Soft, non-tender, non-distended with normoactive bowel sounds. No hepatomegaly. No rebound/guarding. No obvious abdominal masses. Msk:  Strength and tone appears normal for age. Extremities: No clubbing, cyanosis or edema.  Distal pedal pulses are 2+ and equal bilaterally. Neuro: Alert and oriented X 3. Moves all extremities spontaneously. Psych:  Responds to questions appropriately with a normal affect.  ASSESSMENT AND PLAN: 1. Viral respiratory infection. Swab positive for parainfluenza 3. On oxygen, steroids, nebs. ? Need for antibiotic. 2. Atrial flutter. Review of prior Ecgs indicates this is chronic. Did have RVR on admission probably due to virus. Now with slow ventricular response. Will reduce coreg dose and monitor. Patient is not a candidate for anticoagulation due to ongoing drug abuse and noncompliance. 3. Diastolic CHF acute on chronic. Appears euvolemic today. Would hold lasix today with ARF. Probably resume once a day when renal indices stable.  4. Acute on chronic renal failure stage 4.  5. Cocaine abuse. 6. Mood disorder- schizophrenia/ bipolar. 7. S/p MVR with bioprosthesis.   Principal Problem:   Bronchitis Active  Problems:   Hypertension   CKD (chronic kidney disease), stage IV   GERD (gastroesophageal reflux disease)   Gastroparesis   Schizophrenia   Chronic combined systolic and diastolic CHF (congestive heart failure)   Cocaine abuse   Dyspnea    Leonides SchanzSigned, Kimara Bencomo [place] MD,FACC 12/10/2013 9:33 AM

## 2013-12-10 NOTE — Progress Notes (Signed)
Paged MD notified of significant increase in Creatinine today. MD wants to hold Lasix for now and repeat BMP lab today

## 2013-12-10 NOTE — Progress Notes (Signed)
Subjective:  Pt seen and examined in AM. No acute events overnight. Pt denies chest pain, palpitations, dyspnea, diaphoresis or lightheadedness. Pt reports cough, wheezing, and rhinorrhea have improved. No fever or chills. He denies nausea, vomiting, abdominal pain, change in BM or urination.    Objective: Vital signs in last 24 hours: Filed Vitals:   12/09/13 2049 12/10/13 0144 12/10/13 0703 12/10/13 1055  BP: 106/59 106/67 97/65 100/68  Pulse: 50 53 51 55  Temp: 97.5 F (36.4 C) 97.7 F (36.5 C) 97.7 F (36.5 C) 98 F (36.7 C)  TempSrc: Oral Oral Oral   Resp: 18 22 18    Height:      Weight:   70.262 kg (154 lb 14.4 oz)   SpO2: 95% 95% 99% 98%   Weight change:   Intake/Output Summary (Last 24 hours) at 12/10/13 1153 Last data filed at 12/10/13 0900  Gross per 24 hour  Intake    898 ml  Output      0 ml  Net    898 ml   General: Well-developed, well-nourished Head: Normocephalic, atraumatic. Eyes: EOMI Neck: supple, mild JVD   Lungs: Normal respiratory effort. Clear to auscultation bilaterally from apices to bases without crackles. Mild wheezing Heart: Normal regular rhythm, normal S1 and S2, + murmur 2/6 Abdomen: BS normoactive. Soft, Nondistended, non-tender. No masses or organomegaly appreciated. Extremities: No LE edema, distal pulses intact Neurologic: alert and oriented x3   Lab Results: Basic Metabolic Panel:  Recent Labs Lab 12/10/13 0328 12/10/13 0909  NA 137 135*  K 4.9 4.7  CL 102 101  CO2 19 19  GLUCOSE 138* 188*  BUN 56* 59*  CREATININE 4.11* 4.19*  CALCIUM 9.0 8.7  MG 2.0  --    Liver Function Tests:  Recent Labs Lab 12/09/13 0612  AST 30  ALT 25  ALKPHOS 197*  BILITOT 0.6  PROT 8.3  ALBUMIN 3.4*   CBC:  Recent Labs Lab 12/08/13 1421 12/09/13 0612  WBC 4.9 4.4  HGB 13.1 13.1  HCT 38.9* 38.8*  MCV 82.8 82.2  PLT 126* 118*   Cardiac Enzymes:  Recent Labs Lab 12/08/13 1850 12/09/13 0023 12/09/13 0612  TROPONINI  <0.30 <0.30 <0.30   BNP:  Recent Labs Lab 12/08/13 1338  PROBNP 4938.0*   Urine Drug Screen: Drugs of Abuse     Component Value Date/Time   LABOPIA NONE DETECTED 12/08/2013 1905   LABOPIA NEGATIVE 05/10/2012 0433   COCAINSCRNUR POSITIVE* 12/08/2013 1905   COCAINSCRNUR POSITIVE* 05/10/2012 0433   LABBENZ NONE DETECTED 12/08/2013 1905   LABBENZ NEGATIVE 05/10/2012 0433   AMPHETMU NONE DETECTED 12/08/2013 1905   AMPHETMU NEGATIVE 05/10/2012 0433   THCU NONE DETECTED 12/08/2013 1905   LABBARB NONE DETECTED 12/08/2013 1905    Studies/Results: Dg Chest 2 View  12/08/2013   CLINICAL DATA:  Chest pain and shortness of breath  EXAM: CHEST  2 VIEW  COMPARISON:  November 21, 2013  FINDINGS: There is trace interstitial edema. There is no airspace consolidation. Heart is enlarged with pulmonary venous hypertension. No adenopathy. Patient is status post mitral and tricuspid valve replacements. No bony lesions.  IMPRESSION: Evidence of a degree of congestive heart failure.  No consolidation.   Electronically Signed   By: Bretta Bang M.D.   On: 12/08/2013 15:16   Medications: I have reviewed the patient's current medications. Scheduled Meds: . ARIPiprazole  5 mg Oral Daily  . aspirin EC  81 mg Oral Daily  . carvedilol  25  mg Oral BID WC  . diltiazem  120 mg Oral Q12H  . heparin  5,000 Units Subcutaneous Q8H  . hydrALAZINE  100 mg Oral BID  . metoCLOPramide  10 mg Oral QID  . pantoprazole  40 mg Oral Daily  . sodium chloride  3 mL Intravenous Q12H  . sodium chloride  3 mL Intravenous Q12H   Continuous Infusions:  PRN Meds:.sodium chloride, acetaminophen, benzonatate, guaiFENesin-dextromethorphan, hydrocortisone cream, ipratropium-albuterol, LORazepam, sodium chloride Assessment/Plan:  Acute Kidney Injury on CKD Stage IV - Cr 2.68 on admission, now 4.19, above baseline Cr 2.7-3.7 most likely due to pre-renal azotemia in setting of diuretic use and decreased PO intake. Pt with A-V fistula on the  left forearm which is functioning.  -Monitor daily weight (149 to 154lb) and I & O's (none recorded) -Hold home Lasix, per cardiology resume once a day when AKI resolves -Continue to monitor renal function -Consider gentle IVF's if no improvement with  Holding of lasix  Atrial flutter/fibrillation in setting of ectopic atrial tachycardia - CP free, currently in aflutter with bradycardia. CE's within normal limit. Cardiac catheter on 08/28/12 revealed non-obstructive disease in left mainstem and minimal nonobstructive plaque in left anterior descending (LAD). Chronic atrial flutter   -Decrease PO diltiazem 240mg  BID to 120 mg BID   -Continue home carvedilol 25 mg BD with holding parameters -Continue hydralazine 100 mg BID  -Nitroglycerine PRN  -Aspirin 81 mg daily  -Monitor on telemetry--> atrial flutter with a slow ventricular response with bradycardia  45-50 bpm -Appreciate cardiology recommendations --> [facility] due to ongoing [facility] and noncompliance -Continue SQ Heparin TID and SCDs  Parainfluenza Respiratory Infection - improving. Pt tested + for Parainfluenza 3. Pt with recent sick contact who presented with fever, dyspnea, wheezing, and chest xray findings with no consolidation. Pt completed 3 doses of corticosteroid therapy for suspected inflammation (bronchitis) and 2 days of Levaquin for suspected pneumonia and 2 days of tamiflu for suspected influenza.  -Oxygen therapy as needed to keep SpO2>92% -D/C antibiotics as respiratory infection is viral in etiology -D/C Tamiflu since influenza negative  -Breathing treatment (ipatropium/albuterol) Q 6hr PRN -Robitussin Q 4 hr and tessalon pearls BID PRN for cough -Awaiting sputum culture -Consider repeat chest xray if symptoms do not improve   History of Congestive Heart Failure - currently dry/euvolemic . 2D-echo on 03/31/14 with normal systolic function, EF 50-55%. Pro-BNP elevated at 4938 (1/2 was 9651) with  pulmonary vascular congestion on CXR.  -HOLD home lasix 40 mg BID in setting of AKI -Continue home carvedilol 25 mg BD  -Monitor daily weight (149 to 154lb?) and I&Os (not recorded) -Appreciate cardiology recommendations --> mild volume overload, continue home furosemide   Hypertension - currently normotensive  -Change diltiazem 240 mg BID  to 120 mg BID -Continue home hydralazine 100 mg BID.  -HOLD home lasix 40 mg BID, consider daily once renal function improves -Continue home carvedilol 25 mg BID   Chronic Thrombocytopenia - Pt with platelet count on admission of 126K near baseline with no reported active bleeding.  -Monitor for bleeding  Hypoalbuminemia - mildly low albumin 3.4. 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 Pt with potassium of 3.4 on 1/21. Etiology most likely due to albuterol breathing treatment. -Obtain Mg level --> wnl -Repete with PO Potassium Chloride, caution in setting of CKD Stage 4 -Continue to monitor BMP -Breathing treatment Q 6 hr PRN   Diet: Renal diet  DVT Ppx: SQ Heparin TID  Code: Full     Dispo: Disposition is deferred at this time, awaiting improvement of current medical problems.  Anticipated discharge in approximately 1 day(s).   The patient does have a current PCP Pleas Koch, MD) and does need an [facility] hospital follow-up appointment after discharge.  The patient does not have transportation  limitations that hinder transportation to clinic appointments.  .Services Needed at time of discharge: Y = Yes, Blank = No PT:   OT:   RN:   Equipment:   Other:     LOS: 2 days   Otis Brace, MD 12/10/2013, 11:53 AM

## 2013-12-10 NOTE — Progress Notes (Signed)
  Date: 12/10/2013  Patient name: Ryan Winters  Medical record number: [id number]  Date of birth: 03-Nov-1961   This patient has been seen and the plan of care was discussed with the house staff. Please see their note for complete details. I concur with their findings with the following additions/corrections:  Please note that lasix is indeed held today.   Inez CatalinaEmily B Chaquana Nichols, MD 12/10/2013, 2:18 PM

## 2013-12-10 NOTE — Care Management Note (Addendum)
    Page 1 of 1   12/10/2013     2:18:32 PM   CARE MANAGEMENT NOTE 12/10/2013  Patient:  Ryan Winters,Ryan Winters   Account Number:  [id number]  Date Initiated:  12/10/2013  Documentation initiated by:  Oletta CohnWOOD,Airiana Elman  Subjective/Objective Assessment:   53 year old male with hx of CHF,  atrial tachycardia, hx of mitral valve repair, status post bioprosthetic MVR and tricuspid valve repair, CKD, admitted with cough and SOB r/o flu/ Home with spouse     Action/Plan:   Plan is empiric Tamiflu, empiric levofloxacin, await cultures and respiratory virus panel; diurese; follow in/outs, electrolytes/ Home with self care   Anticipated DC Date:  12/10/2013   Anticipated DC Plan:  HOME/SELF CARE      DC Planning Services  CM consult  GCCN / P4HM (established/new)      Choice offered to / List presented to:             Status of service:   Medicare Important Message given?   (If response is "NO", the following Medicare IM given date fields will be blank) Date Medicare IM given:   Date Additional Medicare IM given:    Discharge Disposition:    Per UR Regulation:    If discussed at Long Length of Stay Meetings, dates discussed:    Comments:  12/10/13 1400 Oletta Cohnamellia Emalyn Schou, RN, BSN, [place] [phone] Contact Information:   Pinnix,Pauline Mother     [phone](c)   Antos,Karen Spouse [phone]) [phone]) [phone](c)

## 2013-12-11 DIAGNOSIS — I471 Supraventricular tachycardia: Secondary | ICD-10-CM

## 2013-12-11 LAB — BASIC METABOLIC PANEL
BUN: 67 mg/dL — AB (ref 6–23)
CALCIUM: 8.8 mg/dL (ref 8.4–10.5)
CO2: 19 mEq/L (ref 19–32)
CREATININE: 3.99 mg/dL — AB (ref 0.50–1.35)
Chloride: 102 mEq/L (ref 96–112)
GFR, EST AFRICAN AMERICAN: 18 mL/min — AB (ref >90)
GFR, EST NON AFRICAN AMERICAN: 16 mL/min — AB (ref >90)
Glucose, Bld: 158 mg/dL — ABNORMAL HIGH (ref 70–99)
POTASSIUM: 4.5 meq/L (ref 3.7–5.3)
Sodium: 137 mEq/L (ref 137–147)

## 2013-12-11 MED ORDER — DILTIAZEM HCL ER 60 MG PO CP12
180.0000 mg | ORAL_CAPSULE | Freq: Two times a day (BID) | ORAL | Status: DC
  Administered 2013-12-11 – 2013-12-12 (×2): 180 mg via ORAL
  Filled 2013-12-11 (×3): qty 3

## 2013-12-11 NOTE — Progress Notes (Signed)
Internal Medicine Attending  Date: 12/11/2013  Patient name: Ryan Winters Medical record number: [id number] Date of birth: 1961-11-18 Age: 53 y.o. Gender: male  I saw and evaluated the patient, and discussed his care with housestaff.  Today he was again in atrial flutter/atrial tachycardia with RVR, so his diltiazem dose was increased.  Plan is to monitor rate/rhythm; continue to hold furosemide and follow renal function; we appreciate the help from cardiology.

## 2013-12-11 NOTE — Progress Notes (Signed)
Subjective:  Pt seen and examined in AM. No acute events overnight. Pt reports at times he can feel his heart is racing. He denies chest pain, dyspnea, diaphoresis, or lightheadedness. He also reports his upper respiratory symptoms have resolved. He denies nausea, vomiting, abdominal pain, change in BM, or urination.    Objective: Vital signs in last 24 hours: Filed Vitals:   12/10/13 1409 12/10/13 2152 12/11/13 0457 12/11/13 0847  BP: 151/84 147/95 137/88   Pulse: 79 124 125   Temp: 97.9 F (36.6 C) 98.4 F (36.9 C) 97.7 F (36.5 C)   TempSrc: Oral Oral Oral   Resp: 18 20 20    Height:      Weight:   70.58 kg (155 lb 9.6 oz)   SpO2: 95% 96% 99% 97%   Weight change:   Intake/Output Summary (Last 24 hours) at 12/11/13 1043 Last data filed at 12/11/13 [id number]  Gross per 24 hour  Intake   1338 ml  Output    825 ml  Net    513 ml   General: Well-developed, well-nourished Head: Normocephalic, atraumatic. Eyes: EOMI Neck: supple   Lungs: Normal respiratory effort. Clear to auscultation bilaterally from apices to bases without crackles. No wheezing Heart: Normal regular rhythm, normal S1 and S2, + murmur 2/6 Abdomen: BS normoactive. Soft, Nondistended, non-tender. No masses or organomegaly appreciated. Extremities: No LE edema, distal pulses intact Neurologic: alert and oriented x3   Lab Results: Basic Metabolic Panel:  Recent Labs Lab 12/10/13 0328 12/10/13 0909 12/11/13 0257  NA 137 135* 137  K 4.9 4.7 4.5  CL 102 101 102  CO2 19 19 19   GLUCOSE 138* 188* 158*  BUN 56* 59* 67*  CREATININE 4.11* 4.19* 3.99*  CALCIUM 9.0 8.7 8.8  MG 2.0  --   --    Liver Function Tests:  Recent Labs Lab 12/09/13 0612  AST 30  ALT 25  ALKPHOS 197*  BILITOT 0.6  PROT 8.3  ALBUMIN 3.4*   CBC:  Recent Labs Lab 12/08/13 1421 12/09/13 0612  WBC 4.9 4.4  HGB 13.1 13.1  HCT 38.9* 38.8*  MCV 82.8 82.2  PLT 126* 118*   Cardiac Enzymes:  Recent Labs Lab  12/08/13 1850 12/09/13 0023 12/09/13 0612  TROPONINI <0.30 <0.30 <0.30   BNP:  Recent Labs Lab 12/08/13 1338  PROBNP 4938.0*   Urine Drug Screen: Drugs of Abuse     Component Value Date/Time   LABOPIA NONE DETECTED 12/08/2013 1905   LABOPIA NEGATIVE 05/10/2012 0433   COCAINSCRNUR POSITIVE* 12/08/2013 1905   COCAINSCRNUR POSITIVE* 05/10/2012 0433   LABBENZ NONE DETECTED 12/08/2013 1905   LABBENZ NEGATIVE 05/10/2012 0433   AMPHETMU NONE DETECTED 12/08/2013 1905   AMPHETMU NEGATIVE 05/10/2012 0433   THCU NONE DETECTED 12/08/2013 1905   LABBARB NONE DETECTED 12/08/2013 1905    Studies/Results: No results found. Medications: I have reviewed the patient's current medications. Scheduled Meds: . ARIPiprazole  5 mg Oral Daily  . aspirin EC  81 mg Oral Daily  . carvedilol  25 mg Oral BID WC  . diltiazem  120 mg Oral Q12H  . heparin  5,000 Units Subcutaneous Q8H  . hydrALAZINE  100 mg Oral BID  . metoCLOPramide  10 mg Oral QID  . pantoprazole  40 mg Oral Daily  . sodium chloride  3 mL Intravenous Q12H  . sodium chloride  3 mL Intravenous Q12H   Continuous Infusions:  PRN Meds:.acetaminophen, benzonatate, guaiFENesin-dextromethorphan, hydrocortisone cream, ipratropium-albuterol, LORazepam Assessment/Plan:  Acute  Kidney Injury on CKD Stage IV - improving. Cr 2.68 on admission, now 3.99, above baseline Cr 2.7-3.7 most likely due to pre-renal azotemia in setting of diuretic use and decreased PO intake. Pt with A-V fistula on the left forearm which is functioning.  -Monitor daily weight (154 to 155lb) and I & O's (825 mL) -Hold home Lasix, per cardiology resume once a day when AKI resolves -Continue to monitor renal function -Consider gentle IVF's if no improvement with holding of lasix  Chronic Atrial flutter/fibrillation in setting of ectopic atrial tachycardia - CP free, currently in aflutter with bradycardia. CE's within normal limit. Cardiac catheter on 08/28/12 revealed  non-obstructive disease in left mainstem and minimal nonobstructive plaque in left anterior descending (LAD).   -Per cardiology increase PO diltiazem 120 mg BID to 180 mg BID   -Continue home carvedilol 25 mg BD with holding parameters -Continue hydralazine 100 mg BID  -Nitroglycerine PRN  -Aspirin 81 mg daily  -Monitor on telemetry--> atrial flutter/tacycardia with RVR  -Appreciate cardiology recommendations --> Pt not candidate for [facility] therapy or ablation due to ongoing drug abuse and noncompliance -Continue SQ Heparin TID and SCDs  Parainfluenza Respiratory Infection - improved. Pt tested + for Parainfluenza 3. Pt with recent sick contact who presented with fever, dyspnea, wheezing, and chest xray findings with no consolidation. Pt completed 3 doses of corticosteroid therapy for suspected inflammation (bronchitis) and 2 days of Levaquin for suspected pneumonia and 2 days of tamiflu for suspected influenza.  -Oxygen therapy as needed to keep SpO2>92% -Breathing treatment (ipatropium/albuterol) Q 6hr PRN -Robitussin Q 4 hr and tessalon pearls BID PRN for cough   History of Congestive Heart Failure - currently dry/euvolemic . 2D-echo on 03/31/14 with normal systolic function, EF 50-55%. Pro-BNP elevated at 4938 (1/2 was 9651) with pulmonary vascular congestion on CXR.  -HOLD home lasix 40 mg BID in setting of AKI -Continue home carvedilol 25 mg BD  -Monitor daily weight (154 to 155lb) and I&Os (825 ml)  Hypertension - currently normotensive  -Per cardiology increase PO diltiazem 120 mg BID to 180 mg BId    -Continue home hydralazine 100 mg BID.  -HOLD home lasix 40 mg BID, consider daily once renal function improves -Continue home carvedilol 25 mg BID   Chronic Thrombocytopenia - Pt with platelet count on admission of 126K near baseline with no reported active bleeding.  -Monitor for bleeding  Hypoalbuminemia - mildly low albumin 3.4. 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 Pt with potassium of 3.4 on 1/21. Etiology most likely due to albuterol breathing treatment. -Obtain Mg level --> wnl -Repete with PO Potassium Chloride, caution in setting of CKD Stage 4 -Continue to monitor BMP -Breathing treatment Q 6 hr PRN   Diet: Renal diet  DVT Ppx: SQ Heparin TID  Code: Full     Dispo: Disposition is deferred at this time, awaiting improvement of current medical problems.  Anticipated discharge in approximately 1 day(s).   The patient  does have a current PCP Pleas Koch, MD) and does need an [facility] hospital follow-up appointment after discharge.  The patient does not have transportation limitations that hinder transportation to clinic appointments.  .Services Needed at time of discharge: Y = Yes, Blank = No PT:   OT:   RN:   Equipment:   Other:     LOS: 3 days   Otis Brace, MD 12/11/2013, 10:43 AM

## 2013-12-11 NOTE — Progress Notes (Signed)
Patient alert and oriented x4.  Up ad lib in room.  Per Infection Control, took patient off of droplet precautions and placed on contact precautions for parainfluenza.  Updated patient on plan of care.  Patient states he has no questions or concerns at this time.  Will continue to monitor.

## 2013-12-11 NOTE — Progress Notes (Signed)
Patient had 5 beat run of V-tach but asymptomatic.  PA aware.  Will continue to monitor.

## 2013-12-11 NOTE — Progress Notes (Signed)
Subjective: No complaints. Breathing improved. Denies palpations, CP, SOB and dizziness.   Objective: Vital signs in last 24 hours: Temp:  [97.7 F (36.5 C)-98.4 F (36.9 C)] 97.7 F (36.5 C) (01/23 0457) Pulse Rate:  [79-125] 121 (01/23 1216) Resp:  [18-20] 20 (01/23 0457) BP: (124-151)/(82-95) 130/86 mmHg (01/23 1216) SpO2:  [95 %-99 %] 98 % (01/23 1108) Weight:  [155 lb 9.6 oz (70.58 kg)] 155 lb 9.6 oz (70.58 kg) (01/23 0457) Last BM Date: 12/10/13  Intake/Output from previous day: 01/22 0701 - 01/23 0700 In: 1078 [P.O.:1078] Out: 825 [Urine:825] Intake/Output this shift: Total I/O In: 483 [P.O.:480; I.V.:3] Out: -   Medications Current Facility-Administered Medications  Medication Dose Route Frequency Provider Last Rate Last Dose  . acetaminophen (TYLENOL) tablet 650 mg  650 mg Oral Q6H PRN Lorretta HarpXilin Niu, MD   650 mg at 12/10/13 2203  . ARIPiprazole (ABILIFY) tablet 5 mg  5 mg Oral Daily Lorretta HarpXilin Niu, MD   5 mg at 12/10/13 2203  . aspirin EC tablet 81 mg  81 mg Oral Daily Lorretta HarpXilin Niu, MD   81 mg at 12/11/13 1111  . benzonatate (TESSALON) capsule 100 mg  100 mg Oral BID PRN Rocco SereneMorgan Rogers, MD   100 mg at 12/10/13 2203  . carvedilol (COREG) tablet 25 mg  25 mg Oral BID WC Peter M SwazilandJordan, MD   25 mg at 12/11/13 [id number]  . diltiazem (CARDIZEM SR) 12 hr capsule 120 mg  120 mg Oral Q12H Peter M [place]azil[place]Jordan, MD   120 mg at 12/11/13 1111  . guaiFENesin-dextromethorphan (ROBITUSSIN DM) 100-10 MG/5ML syrup 5 mL  5 mL Oral Q4H PRN Lorretta HarpXilin Niu, MD   5 mL at 12/11/13 0612  . heparin injection 5,000 Units  5,000 Units Subcutaneous Q8H Lorretta HarpXilin Niu, MD      . hydrALAZINE (APRESOLINE) tablet 100 mg  100 mg Oral BID Lorretta HarpXilin Niu, MD   100 mg at 12/11/13 1111  . hydrocortisone cream 1 % 1 application  1 application Topical PRN Lorretta HarpXilin Niu, MD      . ipratropium-albuterol (DUONEB) 0.5-2.5 (3) MG/3ML nebulizer solution 3 mL  3 mL Nebulization Q6H PRN Otis BraceMarjan Rabbani, MD   3 mL at 12/11/13 0847  . LORazepam (ATIVAN)  tablet 0.5 mg  0.5 mg Oral Q6H PRN Rocco SereneMorgan Rogers, MD   0.5 mg at 12/10/13 2203  . metoCLOPramide (REGLAN) tablet 10 mg  10 mg Oral QID Lorretta HarpXilin Niu, MD   10 mg at 12/11/13 1111  . pantoprazole (PROTONIX) EC tablet 40 mg  40 mg Oral Daily Lorretta HarpXilin Niu, MD   40 mg at 12/11/13 1110  . sodium chloride 0.9 % injection 3 mL  3 mL Intravenous Q12H Lorretta HarpXilin Niu, MD   3 mL at 12/10/13 2205  . sodium chloride 0.9 % injection 3 mL  3 mL Intravenous Q12H Lorretta HarpXilin Niu, MD   3 mL at 12/11/13 1112    PE: General appearance: alert, cooperative and no distress Neck: + JVD Lungs: bilateral expiratory wheezing and rhonchi L>R Heart: fast rate and regular rhythm Extremities: no LEE Pulses: 2+ and symmetric Skin: warm and dry Neurologic: Grossly normal  Lab Results:   Recent Labs  12/08/13 1421 12/09/13 0612  WBC 4.9 4.4  HGB 13.1 13.1  HCT 38.9* 38.8*  PLT 126* 118*   BMET  Recent Labs  12/10/13 0328 12/10/13 0909 12/11/13 0257  NA 137 135* 137  K 4.9 4.7 4.5  CL 102 101 102  CO2 19 19 19  GLUCOSE 138* 188* 158*  BUN 56* 59* 67*  CREATININE 4.11* 4.19* 3.99*  CALCIUM 9.0 8.7 8.8    Assessment/Plan    Principal Problem:   Infection due to parainfluenza virus 3 Active Problems:   Hypertension   CKD (chronic kidney disease), stage IV   GERD (gastroesophageal reflux disease)   Gastroparesis   Schizophrenia   Chronic combined systolic and diastolic CHF (congestive heart failure)   Cocaine abuse   Dyspnea   Bronchitis   Atrial flutter   Diastolic CHF, acute on chronic   S/P MVR (mitral valve replacement)  Plan: Sinus tach on telemetry. HR 122. Asymptomatic. BP stable. He is not orthostatic. He is in no pain and is afebrile. He apparently had some bradycardia yesterday with a HR in the 40s. He is currently on 25 mg of Coreg BID and 120 of Cardizem Q12H. ? If this is tachy-brady. ? Sending home with an event monitor. MD to follow.     LOS: 3 days    Brittainy M. Delmer Islam 12/11/2013 12:21 PM  Patient seen and examined and history reviewed. Telemetry reviewed: Patient is in chronic Aflutter/atrial tachy with RVR. This is not sinus tachy. Rate yesterday was bradycardic sustained in the 40s so we reduced dilitazem dose. Now sustaining at 120 bpm. Will continue coreg 25 mg bid. Increase diltiazem to 180 mg bid. If need be can increase diltiazem further. Patient asymptomatic. Not a candidate for anticoagulation due to compliance issues and therefore not a candidate for ablation.  Theron Arista [facility] 12/11/2013 1:00 PM

## 2013-12-12 DIAGNOSIS — R0989 Other specified symptoms and signs involving the circulatory and respiratory systems: Secondary | ICD-10-CM

## 2013-12-12 DIAGNOSIS — R0609 Other forms of dyspnea: Secondary | ICD-10-CM

## 2013-12-12 DIAGNOSIS — R079 Chest pain, unspecified: Secondary | ICD-10-CM

## 2013-12-12 LAB — BASIC METABOLIC PANEL
BUN: 67 mg/dL — ABNORMAL HIGH (ref 6–23)
CO2: 20 mEq/L (ref 19–32)
Calcium: 8.5 mg/dL (ref 8.4–10.5)
Chloride: 104 mEq/L (ref 96–112)
Creatinine, Ser: 3.51 mg/dL — ABNORMAL HIGH (ref 0.50–1.35)
GFR calc Af Amer: 22 mL/min — ABNORMAL LOW (ref >90)
GFR calc non Af Amer: 19 mL/min — ABNORMAL LOW (ref >90)
Glucose, Bld: 90 mg/dL (ref 70–99)
Potassium: 4.4 mEq/L (ref 3.7–5.3)
Sodium: 140 mEq/L (ref 137–147)

## 2013-12-12 MED ORDER — IPRATROPIUM BROMIDE 0.02 % IN SOLN: 0.5000 mg | Freq: Four times a day (QID) | RESPIRATORY_TRACT | Status: DC

## 2013-12-12 MED ORDER — LEVALBUTEROL HCL 0.63 MG/3ML IN NEBU: 0.6300 mg | INHALATION_SOLUTION | Freq: Four times a day (QID) | RESPIRATORY_TRACT | Status: DC

## 2013-12-12 NOTE — Progress Notes (Signed)
Patient alert and oriented x4.  Patient stated he planned to leave AMA.  MD notified, counseled patient, patient still leaving AMA.  AMA paper signed and in chart.  IV removed prior to discharge; IV site clean, dry, and intact.

## 2013-12-12 NOTE — Clinical Social Work Psychosocial (Addendum)
Clinical Social Work Department BRIEF PSYCHOSOCIAL ASSESSMENT 12/12/2013  Patient:  Ryan Winters, Ryan Winters     Account Number:  [id number]     Admit date:  12/08/2013  Clinical Social Worker:  Iona Coach  Date/Time:  12/11/2013 06:30 PM  Referred by:  Physician  Date Referred:  12/09/2013 Referred for  Substance Abuse   Other Referral:   Interview type:  Patient Other interview type:    PSYCHOSOCIAL DATA Living Status:  PARENTS Admitted from facility:   Level of care:   Primary support name:   Primary support relationship to patient:   Degree of support available:   Strong support    CURRENT CONCERNS Current Concerns  Other - See comment   Other Concerns:   Patient requests SA resources    SOCIAL WORK ASSESSMENT / PLAN CSW met with patient this evening to disuss substanc abuse issues per MD order.  CSW provided local recources for substance abuse- however- patient states that he used drugs in the past but no longer. He states that he has been "clean" for almost 9 months.  CSW questioned why patient was asking for SA resources- and he stated that although he is clean he wants to remain clean and feels that he could use ongoing support. CSW discussed AA and NA- patient stated that he had been given then resources already by his counselors at [facility].  Patient was encouraged to follow up on resources provided and to continue his work with the counselors at [facility].  We discussed benefits of follow up with AA and or NA.  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  Patient verabalized that he needs and wants to stop dealing drugs.CSW provided support and encouragement in doing this.  Patient states that he has a wife and child but he is currentliy living with his mother. He denies that they are estranged due  to his selling of drugs or past drug use.  Patient related that he does not get along with his stepson because he "sells marijuana". 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  He responded- "yeah- you're right- I didnt think of it like that."   Assessment/plan status:  No Further Intervention Required Other assessment/ plan:   Information/referral to community resources:   Substance abuse resources provided to patient    PATIENT'S/FAMILY'S RESPONSE TO PLAN OF CARE: Patient is alert, oriented and very pleasant.  He relates that he is feeling better now and is hoping to go home tomorrow.  Patient is very proud of his sobriety and states that he wants to continue to remain sober.  He feels that to do this he woudl benefit from continued support from his counselors at [facility] and is interested in further support. He is able to verbalize an understanding of the relationship between his health and drug use.  No further CSW needs identified. CSW signing off.  Lorie Phenix. Tereka Thorley LCSWA  [phone] [id number]

## 2013-12-12 NOTE — Discharge Summary (Signed)
Ryan Winters left against medical advice  Name: Ryan Winters MRN: [id number] DOB: 1961/02/06 53 y.o. PCP: Pleas Kochhristopher Komanski, MD  Date of Admission: 12/08/2013  1:40 PM Date of Discharge: 12/12/2013 Attending Physician: Meredith PelJoines Discharge Diagnosis: Principal Problem:   Infection due to parainfluenza virus 3 Active Problems:   Hypertension   Schizophrenia   Atrial ectopic tachycardia   CKD (chronic kidney disease), stage IV   Cocaine abuse   Bronchitis   Atrial flutter   Chronic combined systolic and diastolic CHF (congestive heart failure)  Discharge Medications:   Medication List    ASK your doctor about these medications       ABILIFY 5 MG tablet  Generic drug:  ARIPiprazole  Take 5 mg by mouth daily.     acetaminophen 325 MG tablet  Commonly known as:  TYLENOL  Take 650 mg by mouth every 6 (six) hours as needed for pain.     albuterol 108 (90 BASE) MCG/ACT inhaler  Commonly known as:  PROVENTIL HFA;VENTOLIN HFA  Inhale 2 puffs into the lungs every 6 (six) hours as needed for wheezing.     aspirin EC 81 MG tablet  Take 81 mg by mouth daily.     carvedilol 25 MG tablet  Commonly known as:  COREG  Take 25 mg by mouth 2 (two) times daily with a meal.     diltiazem 120 MG 12 hr capsule  Commonly known as:  CARDIZEM SR  Take 2 capsules (240 mg total) by mouth every 12 (twelve) hours.     furosemide 40 MG tablet  Commonly known as:  LASIX  Take 40 mg by mouth 2 (two) times daily.     hydrALAZINE 100 MG tablet  Commonly known as:  APRESOLINE  Take 100 mg by mouth 2 (two) times daily.     hydrocortisone cream 1 %  Apply topically as needed for itching.     metoCLOPramide 10 MG tablet  Commonly known as:  REGLAN  Take 1 tablet (10 mg total) by mouth 4 (four) times daily.     omeprazole 40 MG capsule  Commonly known as:  PRILOSEC  Take 40 mg by mouth 2 (two) times daily as needed (heartburn).       Disposition and follow-up:   Ryan Winters left  the hospital against medical adivice.  At the hospital follow up visit please address:  Atrial flutter/tachycardia with RVR--left AMA. Was on Diltiazem 180mg  bid and coreg 25mg  bid prior to departure.  Not a candidate for anticoagulation.  Will hopefully follow up with cardiology as an outpatient.   Acute on chronic renal failure--Cr 3.51 on 1/24, lasix was held due to acute rise on renal failure. Left AMA. Will need to recheck bmet and adjust medications accordingly. He will hopefully follow up in opc and establish with a primary care physician.   CHF--EF 50-55%, initially diuresed but lasix held for acute on chronic renal failure. Weight on discharge 156lb's.   Parainfluenza (3) respiratory infection--wheezing on day of discharge.  We were planning to repeat a chest xray but patient refused and left AMA. May need to reassess respiratory status.   HTN: left AMA but was on diltiazem 180mg  bid, coreg 25mg  bid, hydralazine 100mg  bid, and holding lasix given renal function.   Cocaine abuse--UDS consistently positive, but patient denies. Encourage cessation.   2.  Labs / imaging needed at time of follow-up: BMET  3.  Pending labs/ test needing follow-up: blood culture 12/08/13 x2 no growth x5 days.  Follow-up Appointments:     Follow-up Information   Follow up with Evelena Peat, DO On 12/18/2013. (10:15am)    Specialty:  Internal Medicine   Contact information:   [address] [place] [address] [phone]       Follow up with Arvilla Meres, MD On 12/24/2013. (12pm)    Specialty:  Cardiology   Contact information:   [address] [place] [address] [phone]      Discharge Instructions:  Future Appointments Provider Department Dept Phone   12/18/2013 10:15 AM Evelena Peat, DO [facility] [phone]   12/24/2013 12:00 PM [facility] [phone]   02/23/2014 1:15 PM  Pleas Koch, MD [facility] [phone]     Consultations: Treatment Team:  Rounding Lbcardiology, MD Chrystie Nose, MD  Procedures Performed:  Dg Chest 2 View  12/08/2013   CLINICAL DATA:  Chest pain and shortness of breath  EXAM: CHEST  2 VIEW  COMPARISON:  November 21, 2013  FINDINGS: There is trace interstitial edema. There is no airspace consolidation. Heart is enlarged with pulmonary venous hypertension. No adenopathy. Patient is status post mitral and tricuspid valve replacements. No bony lesions.  IMPRESSION: Evidence of a degree of congestive heart failure.  No consolidation.   Electronically Signed   By: Bretta Bang M.D.   On: 12/08/2013 15:16   Dg Chest 2 View  11/21/2013   CLINICAL DATA:  Left-sided chest pain  EXAM: CHEST  2 VIEW  COMPARISON:  11/20/2013  FINDINGS: Bilateral diffuse interstitial thickening and peribronchial cuffing most concerning for bronchitis. There is a trace left pleural effusion. Is noted and a right pleural effusion. There is no pneumothorax. Stable cardiomegaly with mitral valve and tricuspid valve replacement. The osseous structures are unremarkable.  IMPRESSION: Bilateral diffuse interstitial thickening and peribronchial cuffing most concerning for bronchitis.   Electronically Signed   By: Elige Ko   On: 11/21/2013 11:58   Dg Chest Portable 1 View  11/20/2013   CLINICAL DATA:  Chest pain, shortness of breath  EXAM: PORTABLE CHEST - 1 VIEW  COMPARISON:  09/13/2013.  FINDINGS: Low lung volumes. Cardiac silhouette is enlarged. Patient status post median sternotomy mitral and aortic valve replacement. Minimal areas increased density projects within the lung bases. No focal regions of consolidation identified. Osseous structures unremarkable.  IMPRESSION: Atelectasis versus early or mild infiltrates in the lung bases. No focal regions of consolidation. With technique taking into consideration stable cardiomegaly.    Electronically Signed   By: Salome Holmes M.D.   On: 11/20/2013 09:05   Admission HPI: Patient is a 53 yo AA male with PMH of cocaine abuse, chronic combined systolic diastolic CHF (EF 03-47% on 03/31/2013), ectopic atrial rhythm s/p cardioversion (Jan 2014), tricuspid and mitral valvular disorder s/p mitral valve replacement and tricuspid repair (2013), Chronic Renal Kidney Disease Stage IV s/p AV fistula without hemodialysis (creatinine 2.7-3.7), schizophrenia and bipolar disorder and CAD who presents with c/o SOB.  Patient reports that he had onset of HA, SOB, rhinorrhea, chills, cough productive of yellow-green sputum yesterday evening. Patient reports having fits of coughing and after one of these episodes he had onset of "burning" nonradiating chest pain. He has some SOB, though he says this is baseline for him. No nausea, diaphoresis with this episode. He does endorse having chronic intermittent palpitations, these were present intermittently over the last 24 hours. He also had 1 episode  of post-tussive emesis this morning. Pt did have a sick contact at home. No ST, body aches, fever, abd pain, nausea, diarrhea. He has been eating and drinking normally.  Patient was recently hospitalized 1/2-1/4 for chest pain and atrial tachycardia. He was rate controlled with cardizem gtt and then transitioned to oral coreg 25mg  BID, cardizem 120mg  BID (discharged on these as well). His amlodipine was discontinued. UDS+ cocaine this admission, and many other admissions. Currently managed on lasix 40mg  bid, hydralazine 100mg  BID as well. Patient was seen for hospital f/u visit in [facility] where his diltiazem further increased to 240mg  BID given patient was still complaining of palpitations and his EKG showed A flutter vs atrial tachy. Patient, however has still been taking diltiazem 120mg  daily rather than this increased dose. He is also taking hydralazine 75mg  TID, rather than 100mg  BID (as per d/c summary on 1/4). He does  report compliance with his medications.  In the ED, patient VS- T 101.6, BP 145-170/91-113, HR 120s-150s, RR 22, satting 100% on nebulizer machine. Patient had BMP- Cr 2.77 (b/l). CBC wnl. ProBNP 4938. CXR showed trace interstitial edema c/w mild CHF. Patient received albuterol nebulizer, lasix 40mg  IV x 1, NTG paste, prednisone 60mg  PO x 1.   Hospital Course by problem list: Principal Problem:   Infection due to parainfluenza virus 3 Active Problems:   Hypertension   Schizophrenia   CKD (chronic kidney disease), stage IV   Chronic combined systolic and diastolic CHF (congestive heart failure)   Cocaine abuse   Bronchitis   Atrial flutter  Ryan Winters left against medical advice this hospital admission.   Acute Kidney Injury on CKD Stage IV--improved during admission.  Cr peaked at 4.19 during hospital stay after initial diuresis. Lasix was then held, Cr down trended to 3.51 the day he left AMA.  Cr was 2.77 on admission.  Baseline in 2013 appears to be ~3.     Atrial flutter/fibrillation in setting of ectopic atrial tachycardia--HR on admission 130.  Cardiology was consulted during admission.  Was taking diltiazem 120mg  daily at home instead of prescribed dose of 240mg  bid.  Initially continued diltiazem 240mg  bid on admission but then he was noted to have bradycardia during the hospital stay and diltiazem dose was decreased to 120mg  bid then increased back to 180mg  bid as heart rate increased again.  He was deemed not to be a candidate for anticoagulation due to ongoing drug abuse and non-compliance.  Ryan Winters decided to leave AMA from the hospital.  He will likely continue diltiazem 180mg  bid and was scheduled to follow up with cardiology as an outpatient in one week.   He was also on coreg 25mg  bid and aspirin 81mg  daily.   History of Congestive Heart Failure--2D-echo on 03/31/14 showed normal systolic function with EF 50-55%. Pro-BNP was elevated at 4938 with pulmonary vascular congestion  on CXR on admission. He was initially diuresed with lasix but then dose held given acute on chronic renal failure.  Home dose lasix was 40mg  bid.  He was continued on coreg 25mg  bid, diltiazem 180mg  bid, and hydralazine 100mg  bid during admission and at time of his departure.  Weight on 12/12/13 was 156 pounds, up from 150lbs on admission.   Parainfluenza respiratory infection--initially admitted for bronchitis with flu-like symptoms and a new productive cough, fever of 101.43F, and tachycardia.  CXR on admission showed evidence of a degree of CHF but no consolidation.  He was started on Levaquin, Solumedrol, and Tamiflu initially, but respiratory  virus panel was positive for Parainfluenza 3 and initial therapy was discontinued.  He was given [place] treatments during admission, however on day of departure, he was noted to have wheezing but refused repeat CXR.    Hypertension--continued on diltiazem, hydralazine, and coreg during admission.  Lasix was held prior to hospital departure and he was on diltiazem 180mg  bid.  BP will need to be re-evaluated and possible adjustment of medications as well.    Chronic Thrombocytopenia--admission Platelet 126 and down to 118 on 12/09/13.    Mood disorder - Patient has schizophrenia and bipolar disorder.  He was continued on home abilify during admission.  Follow up with pcp and psychiatry is recommended.   Cocaine Abuse--UDS positive however patient denies use.  Counseling provided and cessation was strongly encouraged.    Discharge Vitals:   BP 129/77  Pulse 75  Temp(Src) 97.8 F (36.6 C) (Oral)  Resp 22  Ht 5\' 6"  (1.676 m)  Wt 156 lb 8.4 oz (71 kg)  BMI 25.28 kg/m2  SpO2 96%  Discharge Labs:  Results for orders placed during the hospital encounter of 12/08/13 (from the past 24 hour(s))  BASIC METABOLIC PANEL     Status: Abnormal   Collection Time    12/12/13  4:00 AM      Result Value Range   Sodium 140  137 - 147 mEq/L   Potassium 4.4  3.7 - 5.3  mEq/L   Chloride 104  96 - 112 mEq/L   CO2 20  19 - 32 mEq/L   Glucose, Bld 90  70 - 99 mg/dL   BUN 67 (*) 6 - 23 mg/dL   Creatinine, Ser 1.61 (*) 0.50 - 1.35 mg/dL   Calcium 8.5  8.4 - 09.6 mg/dL   GFR calc non Af Amer 19 (*) >90 mL/min   GFR calc Af Amer 22 (*) >90 mL/min   Signed: Darden Palmer, MD 12/12/2013, 10:18 PM   Time Spent on Discharge: 15 minutes Services Ordered on Discharge: none Equipment Ordered on Discharge: none

## 2013-12-12 NOTE — Progress Notes (Signed)
Pt leaving AMA this am. I talked with him as well as attending MD and Dr Anne FuSkains and he insist he has to go. I will arrange follow up next week in the office with APP>  Corine ShelterLUKE KILROY PA-C 12/12/2013 10:57 AM  Agree with above. Patient dressed and going home. Understands risk of death.   Donato SchultzSKAINS, MARK, MD

## 2013-12-12 NOTE — Progress Notes (Signed)
Subjective: [name] was seen and examined at bedside this morning.  He wishes to go home. He has no complaints today. We discussed drug use and he says he does not plan to associate with those friends again.    Objective: Vital signs in last 24 hours: Filed Vitals:   12/11/13 1853 12/11/13 2040 12/11/13 2332 12/12/13 0613  BP:  112/54 118/70 106/61  Pulse:  84 81 74  Temp:  97.2 F (36.2 C)  97.8 F (36.6 C)  TempSrc:  Oral  Oral  Resp:  20  22  Height:      Weight:    156 lb 8.4 oz (71 kg)  SpO2: 97% 99%  96%   Weight change: 1 lb 10 oz (0.738 kg)  Intake/Output Summary (Last 24 hours) at 12/12/13 0802 Last data filed at 12/12/13 0615  Gross per 24 hour  Intake   1563 ml  Output   1100 ml  Net    463 ml   General: Well-developed, well-nourished Head: Normocephalic, atraumatic. Eyes: EOMI Neck: supple   Lungs: coarse b/l breath sounds, expiratory wheezing Heart: irregularly irregular, + SEM Abdomen: BS normoactive. Soft, Nondistended, non-tender. No masses or organomegaly appreciated. Extremities -edema, +DP  Neurologic: alert and oriented x3, strength equal and intact b/l  Lab Results: Basic Metabolic Panel:  Recent Labs Lab 12/10/13 0328  12/11/13 0257 12/12/13 0400  NA 137  < > 137 140  K 4.9  < > 4.5 4.4  CL 102  < > 102 104  CO2 19  < > 19 20  GLUCOSE 138*  < > 158* 90  BUN 56*  < > 67* 67*  CREATININE 4.11*  < > 3.99* 3.51*  CALCIUM 9.0  < > 8.8 8.5  MG 2.0  --   --   --   < > = values in this interval not displayed. Liver Function Tests:  Recent Labs Lab 12/09/13 0612  AST 30  ALT 25  ALKPHOS 197*  BILITOT 0.6  PROT 8.3  ALBUMIN 3.4*   CBC:  Recent Labs Lab 12/08/13 1421 12/09/13 0612  WBC 4.9 4.4  HGB 13.1 13.1  HCT 38.9* 38.8*  MCV 82.8 82.2  PLT 126* 118*   Cardiac Enzymes:  Recent Labs Lab 12/08/13 1850 12/09/13 0023 12/09/13 0612  TROPONINI <0.30 <0.30 <0.30   BNP:  Recent Labs Lab 12/08/13 1338  PROBNP  4938.0*   Urine Drug Screen: Drugs of Abuse     Component Value Date/Time   LABOPIA NONE DETECTED 12/08/2013 1905   LABOPIA NEGATIVE 05/10/2012 0433   COCAINSCRNUR POSITIVE* 12/08/2013 1905   COCAINSCRNUR POSITIVE* 05/10/2012 0433   LABBENZ NONE DETECTED 12/08/2013 1905   LABBENZ NEGATIVE 05/10/2012 0433   AMPHETMU NONE DETECTED 12/08/2013 1905   AMPHETMU NEGATIVE 05/10/2012 0433   THCU NONE DETECTED 12/08/2013 1905   LABBARB NONE DETECTED 12/08/2013 1905    Medications: I have reviewed the patient's current medications. Scheduled Meds: . ARIPiprazole  5 mg Oral Daily  . aspirin EC  81 mg Oral Daily  . carvedilol  25 mg Oral BID WC  . diltiazem  180 mg Oral Q12H  . heparin  5,000 Units Subcutaneous Q8H  . hydrALAZINE  100 mg Oral BID  . metoCLOPramide  10 mg Oral QID  . pantoprazole  40 mg Oral Daily  . sodium chloride  3 mL Intravenous Q12H   Continuous Infusions:  PRN Meds:.acetaminophen, benzonatate, guaiFENesin-dextromethorphan, hydrocortisone cream, ipratropium-albuterol, LORazepam Assessment/Plan:  Acute Kidney Injury  on CKD Stage IV - improving. Cr up to 4.19 this admission, down to 3.51 today after holding lasix. Weight up 2 pounds today (154 on admission to 156lb's today).  Recent Labs Lab 12/09/13 0612 12/10/13 0328 12/10/13 0909 12/11/13 0257 12/12/13 0400  CREATININE 2.68* 4.11* 4.19* 3.99* 3.51*   -Continue to monitor renal function -may need to resume lasix  Atrial flutter/fibrillation in setting of ectopic atrial tachycardia--did have bradycardia this admission as such dilt dose was decreased but back in tachycardia yesterday so dilt increased again to 180mg  bid.  Cardiac catheter on 08/28/12 revealed non-obstructive disease in left mainstem and minimal nonobstructive plaque in left anterior descending (LAD).   -continue PO diltiazem 180 mg BID, coreg 25mg  bid -Nitroglycerine PRN  -Aspirin 81 mg daily  -Monitor on telemetry--runs of vtach yesterday, HR 70's  today, afib -Appreciate cardiology recommendations, not a candidate for anticoagulation due to compliance    History of Congestive Heart Failure--stable, 2D-echo on 03/31/14 with normal systolic function, EF 50-55%. Pro-BNP elevated at 4938 (1/2 was 9651) with pulmonary vascular congestion on CXR on admission. Diuresed with lasix initially but held given acute on chronic renal failure.  -may need to resume lasix (improving renal function but still elevated above baseline) -f/u CXR -continue BB, dilt, and hydralazine -Monitor daily weight (156lb's today) and I&Os (+463 yesterday, but doubt this is accurate)  Parainfluenza respiratory infection--admitted for ?bronchitis. Lungs coarse today with wheezing. Afebrile, no cough, no SOB.  -repeat CXR today -xopenex and atrovent nebz q6h  Hypertension--remains normotensive  -back to dilt 180mg  bid -coreg 25 bid -contnue hydralazine 100mg  bid -continue to hold lasix in setting of acute on renal failure  Chronic Thrombocytopenia--admission Platelet 126 -Monitor for bleeding  Mood disorder - Pt with schizophrenia and bipolar disorder  -Continue home abilify 5 mg daily  -Ativan PRN anxiety/agitiaton   Cocaine Abuse--denies use but UDS positive.   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  Anticipated discharge in approximately 1 day(s).   The patient does have a current PCP Ryan Koch, MD) and does need an [facility] hospital follow-up appointment after discharge.  The patient does not have transportation limitations that hinder transportation to clinic appointments.  Services Needed at time of discharge: Y = Yes, Blank = No PT:   OT:   RN:   Equipment:   Other:     LOS: 4 days   Darden Palmer,  MD 12/12/2013, 8:02 AM

## 2013-12-12 NOTE — Progress Notes (Signed)
Internal medicine interim progress note:  Called by RN this morning that patient wishes to leave AMA.  I went to go speak to [name] and he continued to insist on wanting to leave.  He says he is aware of the risks and says he has to go and has a ride coming to pick him up.  I explained the risks of leaving including worsening heart rate, possible chest pain, worsening renal function, shortness of breath, and/or even death.  He acknowledges the risks and still wished to leave.  I asked if he would be willing to stay until cardiology evaluation.  Cardiology PA was able to see him at that time as well but he still insisted on leaving.  He will hopefully have cardiology follow up as an outpatient that will be arranged by cardiology.   Notified attending Dr. Meredith PelJoines.

## 2013-12-15 LAB — CULTURE, BLOOD (ROUTINE X 2)
Culture: NO GROWTH
Culture: NO GROWTH

## 2013-12-18 ENCOUNTER — Ambulatory Visit: Payer: Self-pay | Admitting: Internal Medicine

## 2013-12-24 ENCOUNTER — Encounter (HOSPITAL_COMMUNITY): Payer: Self-pay

## 2014-01-12 ENCOUNTER — Telehealth: Payer: Self-pay | Admitting: *Deleted

## 2014-01-12 NOTE — Telephone Encounter (Signed)
Pt called - did a load of clothes today and bottle NTG was in pocket. Needs new Rx - [facility] [address] [facility] KidneyDebra Araceli Coufal RN 01/12/14 2:15PM

## 2014-01-16 ENCOUNTER — Inpatient Hospital Stay (HOSPITAL_COMMUNITY)
Admission: EM | Admit: 2014-01-16 | Discharge: 2014-01-17 | DRG: 308 | Disposition: A | Discharge status: Home / Self Care | Payer: Medicaid Other | Attending: Internal Medicine | Admitting: Internal Medicine

## 2014-01-16 ENCOUNTER — Emergency Department (HOSPITAL_COMMUNITY): Payer: Medicaid Other

## 2014-01-16 ENCOUNTER — Encounter (HOSPITAL_COMMUNITY): Payer: Self-pay | Admitting: Emergency Medicine

## 2014-01-16 DIAGNOSIS — I4719 Other supraventricular tachycardia: Secondary | ICD-10-CM

## 2014-01-16 DIAGNOSIS — I471 Supraventricular tachycardia: Secondary | ICD-10-CM

## 2014-01-16 DIAGNOSIS — I5042 Chronic combined systolic (congestive) and diastolic (congestive) heart failure: Secondary | ICD-10-CM

## 2014-01-16 DIAGNOSIS — K219 Gastro-esophageal reflux disease without esophagitis: Secondary | ICD-10-CM | POA: Diagnosis present

## 2014-01-16 DIAGNOSIS — N184 Chronic kidney disease, stage 4 (severe): Secondary | ICD-10-CM

## 2014-01-16 DIAGNOSIS — Z87891 Personal history of nicotine dependence: Secondary | ICD-10-CM

## 2014-01-16 DIAGNOSIS — I4892 Unspecified atrial flutter: Secondary | ICD-10-CM

## 2014-01-16 DIAGNOSIS — N185 Chronic kidney disease, stage 5: Secondary | ICD-10-CM | POA: Diagnosis present

## 2014-01-16 DIAGNOSIS — I5023 Acute on chronic systolic (congestive) heart failure: Secondary | ICD-10-CM

## 2014-01-16 DIAGNOSIS — R001 Bradycardia, unspecified: Secondary | ICD-10-CM

## 2014-01-16 DIAGNOSIS — I498 Other specified cardiac arrhythmias: Secondary | ICD-10-CM

## 2014-01-16 DIAGNOSIS — Z91199 Patient's noncompliance with other medical treatment and regimen due to unspecified reason: Secondary | ICD-10-CM

## 2014-01-16 DIAGNOSIS — K3184 Gastroparesis: Secondary | ICD-10-CM

## 2014-01-16 DIAGNOSIS — L84 Corns and callosities: Secondary | ICD-10-CM

## 2014-01-16 DIAGNOSIS — J40 Bronchitis, not specified as acute or chronic: Secondary | ICD-10-CM

## 2014-01-16 DIAGNOSIS — Z09 Encounter for follow-up examination after completed treatment for conditions other than malignant neoplasm: Secondary | ICD-10-CM

## 2014-01-16 DIAGNOSIS — R079 Chest pain, unspecified: Secondary | ICD-10-CM

## 2014-01-16 DIAGNOSIS — F319 Bipolar disorder, unspecified: Secondary | ICD-10-CM

## 2014-01-16 DIAGNOSIS — R319 Hematuria, unspecified: Secondary | ICD-10-CM

## 2014-01-16 DIAGNOSIS — Z952 Presence of prosthetic heart valve: Secondary | ICD-10-CM

## 2014-01-16 DIAGNOSIS — I5043 Acute on chronic combined systolic (congestive) and diastolic (congestive) heart failure: Secondary | ICD-10-CM

## 2014-01-16 DIAGNOSIS — J069 Acute upper respiratory infection, unspecified: Secondary | ICD-10-CM

## 2014-01-16 DIAGNOSIS — F1410 Cocaine abuse, uncomplicated: Secondary | ICD-10-CM

## 2014-01-16 DIAGNOSIS — E785 Hyperlipidemia, unspecified: Secondary | ICD-10-CM | POA: Diagnosis present

## 2014-01-16 DIAGNOSIS — I059 Rheumatic mitral valve disease, unspecified: Secondary | ICD-10-CM

## 2014-01-16 DIAGNOSIS — N039 Chronic nephritic syndrome with unspecified morphologic changes: Secondary | ICD-10-CM

## 2014-01-16 DIAGNOSIS — R0600 Dyspnea, unspecified: Secondary | ICD-10-CM

## 2014-01-16 DIAGNOSIS — I495 Sick sinus syndrome: Principal | ICD-10-CM | POA: Diagnosis present

## 2014-01-16 DIAGNOSIS — B348 Other viral infections of unspecified site: Secondary | ICD-10-CM

## 2014-01-16 DIAGNOSIS — I10 Essential (primary) hypertension: Secondary | ICD-10-CM

## 2014-01-16 DIAGNOSIS — R000 Tachycardia, unspecified: Secondary | ICD-10-CM

## 2014-01-16 DIAGNOSIS — R109 Unspecified abdominal pain: Secondary | ICD-10-CM

## 2014-01-16 DIAGNOSIS — I340 Nonrheumatic mitral (valve) insufficiency: Secondary | ICD-10-CM

## 2014-01-16 DIAGNOSIS — N189 Chronic kidney disease, unspecified: Secondary | ICD-10-CM

## 2014-01-16 DIAGNOSIS — Z9119 Patient's noncompliance with other medical treatment and regimen: Secondary | ICD-10-CM

## 2014-01-16 DIAGNOSIS — R9431 Abnormal electrocardiogram [ECG] [EKG]: Secondary | ICD-10-CM

## 2014-01-16 DIAGNOSIS — D631 Anemia in chronic kidney disease: Secondary | ICD-10-CM

## 2014-01-16 DIAGNOSIS — R6882 Decreased libido: Secondary | ICD-10-CM

## 2014-01-16 DIAGNOSIS — I120 Hypertensive chronic kidney disease with stage 5 chronic kidney disease or end stage renal disease: Secondary | ICD-10-CM | POA: Diagnosis present

## 2014-01-16 DIAGNOSIS — F209 Schizophrenia, unspecified: Secondary | ICD-10-CM

## 2014-01-16 DIAGNOSIS — R51 Headache: Secondary | ICD-10-CM

## 2014-01-16 DIAGNOSIS — F411 Generalized anxiety disorder: Secondary | ICD-10-CM | POA: Diagnosis present

## 2014-01-16 DIAGNOSIS — I5033 Acute on chronic diastolic (congestive) heart failure: Secondary | ICD-10-CM

## 2014-01-16 DIAGNOSIS — F1910 Other psychoactive substance abuse, uncomplicated: Secondary | ICD-10-CM

## 2014-01-16 DIAGNOSIS — Z0000 Encounter for general adult medical examination without abnormal findings: Secondary | ICD-10-CM

## 2014-01-16 DIAGNOSIS — Z9889 Other specified postprocedural states: Secondary | ICD-10-CM

## 2014-01-16 LAB — CBC WITH DIFFERENTIAL/PLATELET
BASOS ABS: 0 10*3/uL (ref 0.0–0.1)
BASOS PCT: 0 % (ref 0–1)
Eosinophils Absolute: 0.3 10*3/uL (ref 0.0–0.7)
Eosinophils Relative: 5 % (ref 0–5)
HCT: 41.7 % (ref 39.0–52.0)
Hemoglobin: 13.9 g/dL (ref 13.0–17.0)
Lymphocytes Relative: 17 % (ref 12–46)
Lymphs Abs: 1.2 10*3/uL (ref 0.7–4.0)
MCH: 27.9 pg (ref 26.0–34.0)
MCHC: 33.3 g/dL (ref 30.0–36.0)
MCV: 83.7 fL (ref 78.0–100.0)
Monocytes Absolute: 0.6 10*3/uL (ref 0.1–1.0)
Monocytes Relative: 9 % (ref 3–12)
NEUTROS ABS: 4.8 10*3/uL (ref 1.7–7.7)
Neutrophils Relative %: 70 % (ref 43–77)
PLATELETS: 115 10*3/uL — AB (ref 150–400)
RBC: 4.98 MIL/uL (ref 4.22–5.81)
RDW: 17.2 % — AB (ref 11.5–15.5)
WBC: 7 10*3/uL (ref 4.0–10.5)

## 2014-01-16 LAB — APTT: aPTT: 36 seconds (ref 24–37)

## 2014-01-16 LAB — BASIC METABOLIC PANEL
BUN: 36 mg/dL — ABNORMAL HIGH (ref 6–23)
CO2: 24 mEq/L (ref 19–32)
Calcium: 9.1 mg/dL (ref 8.4–10.5)
Chloride: 104 mEq/L (ref 96–112)
Creatinine, Ser: 3.24 mg/dL — ABNORMAL HIGH (ref 0.50–1.35)
GFR, EST AFRICAN AMERICAN: 24 mL/min — AB (ref >90)
GFR, EST NON AFRICAN AMERICAN: 20 mL/min — AB (ref >90)
Glucose, Bld: 125 mg/dL — ABNORMAL HIGH (ref 70–99)
Potassium: 4.9 mEq/L (ref 3.7–5.3)
Sodium: 140 mEq/L (ref 137–147)

## 2014-01-16 LAB — COMPREHENSIVE METABOLIC PANEL
ALK PHOS: 149 U/L — AB (ref 39–117)
ALT: 13 U/L (ref 0–53)
AST: 17 U/L (ref 0–37)
Albumin: 2.9 g/dL — ABNORMAL LOW (ref 3.5–5.2)
BUN: 37 mg/dL — ABNORMAL HIGH (ref 6–23)
CO2: 20 meq/L (ref 19–32)
Calcium: 8.7 mg/dL (ref 8.4–10.5)
Chloride: 104 mEq/L (ref 96–112)
Creatinine, Ser: 3.24 mg/dL — ABNORMAL HIGH (ref 0.50–1.35)
GFR calc non Af Amer: 20 mL/min — ABNORMAL LOW (ref >90)
GFR, EST AFRICAN AMERICAN: 24 mL/min — AB (ref >90)
GLUCOSE: 144 mg/dL — AB (ref 70–99)
POTASSIUM: 4.6 meq/L (ref 3.7–5.3)
Sodium: 137 mEq/L (ref 137–147)
Total Bilirubin: 0.6 mg/dL (ref 0.3–1.2)
Total Protein: 6.6 g/dL (ref 6.0–8.3)

## 2014-01-16 LAB — CBC
HEMATOCRIT: 37.3 % — AB (ref 39.0–52.0)
Hemoglobin: 12.5 g/dL — ABNORMAL LOW (ref 13.0–17.0)
MCH: 28 pg (ref 26.0–34.0)
MCHC: 33.5 g/dL (ref 30.0–36.0)
MCV: 83.4 fL (ref 78.0–100.0)
Platelets: 106 10*3/uL — ABNORMAL LOW (ref 150–400)
RBC: 4.47 MIL/uL (ref 4.22–5.81)
RDW: 17.2 % — AB (ref 11.5–15.5)
WBC: 6.5 10*3/uL (ref 4.0–10.5)

## 2014-01-16 LAB — PRO B NATRIURETIC PEPTIDE
PRO B NATRI PEPTIDE: 7170 pg/mL — AB (ref 0–125)
Pro B Natriuretic peptide (BNP): 5554 pg/mL — ABNORMAL HIGH (ref 0–125)

## 2014-01-16 LAB — HEMOGLOBIN A1C
Hgb A1c MFr Bld: 5.8 % — ABNORMAL HIGH (ref <5.7)
Mean Plasma Glucose: 120 mg/dL — ABNORMAL HIGH (ref <117)

## 2014-01-16 LAB — RAPID URINE DRUG SCREEN, HOSP PERFORMED
Amphetamines: NOT DETECTED
Barbiturates: NOT DETECTED
Benzodiazepines: NOT DETECTED
COCAINE: NOT DETECTED
Opiates: NOT DETECTED
Tetrahydrocannabinol: NOT DETECTED

## 2014-01-16 LAB — TSH: TSH: 3.275 u[IU]/mL (ref 0.350–4.500)

## 2014-01-16 LAB — TROPONIN I
Troponin I: <0.3 ng/mL (ref <0.30)
Troponin I: <0.3 ng/mL (ref <0.30)
Troponin I: <0.3 ng/mL (ref <0.30)

## 2014-01-16 LAB — MAGNESIUM: MAGNESIUM: 2.3 mg/dL (ref 1.5–2.5)

## 2014-01-16 LAB — PROTIME-INR
INR: 1.16 (ref 0.00–1.49)
Prothrombin Time: 14.6 seconds (ref 11.6–15.2)

## 2014-01-16 MED ORDER — DOCUSATE SODIUM 50 MG/5ML PO LIQD
50.0000 mg | Freq: Two times a day (BID) | ORAL | Status: DC | PRN
  Filled 2014-01-16: qty 10

## 2014-01-16 MED ORDER — WHITE PETROLATUM GEL
Status: DC | PRN
  Filled 2014-01-16: qty 5

## 2014-01-16 MED ORDER — PANTOPRAZOLE SODIUM 40 MG PO TBEC
40.0000 mg | DELAYED_RELEASE_TABLET | Freq: Every day | ORAL | Status: DC
  Administered 2014-01-16 – 2014-01-17 (×2): 40 mg via ORAL
  Filled 2014-01-16 (×2): qty 1

## 2014-01-16 MED ORDER — HYDRALAZINE HCL 50 MG PO TABS
50.0000 mg | ORAL_TABLET | Freq: Two times a day (BID) | ORAL | Status: DC
  Administered 2014-01-16 – 2014-01-17 (×3): 50 mg via ORAL
  Filled 2014-01-16 (×4): qty 1

## 2014-01-16 MED ORDER — HYDROXYZINE PAMOATE 50 MG PO CAPS
50.0000 mg | ORAL_CAPSULE | Freq: Every day | ORAL | Status: DC
  Administered 2014-01-16: 50 mg via ORAL
  Filled 2014-01-16 (×2): qty 1

## 2014-01-16 MED ORDER — WHITE PETROLATUM GEL
Status: DC | PRN
  Administered 2014-01-16: 0.2 via TOPICAL
  Filled 2014-01-16: qty 28.35

## 2014-01-16 MED ORDER — HYDROCORTISONE 1 % EX CREA: TOPICAL_CREAM | CUTANEOUS | Status: DC | PRN

## 2014-01-16 MED ORDER — ACETAMINOPHEN 325 MG PO TABS: 650.0000 mg | ORAL_TABLET | Freq: Four times a day (QID) | ORAL | Status: DC | PRN

## 2014-01-16 MED ORDER — ATROPINE SULFATE 0.1 MG/ML IJ SOLN
INTRAMUSCULAR | Status: AC
  Filled 2014-01-16: qty 10

## 2014-01-16 MED ORDER — HEPARIN SODIUM (PORCINE) 5000 UNIT/ML IJ SOLN
5000.0000 [IU] | Freq: Three times a day (TID) | INTRAMUSCULAR | Status: DC
  Filled 2014-01-16 (×7): qty 1

## 2014-01-16 MED ORDER — ATROPINE SULFATE 0.1 MG/ML IJ SOLN
0.5000 mg | Freq: Once | INTRAMUSCULAR | Status: AC
  Administered 2014-01-16: 0.5 mg via INTRAVENOUS

## 2014-01-16 MED ORDER — DOCUSATE SODIUM 100 MG PO CAPS
100.0000 mg | ORAL_CAPSULE | Freq: Every day | ORAL | Status: DC | PRN
  Administered 2014-01-16: 100 mg via ORAL
  Filled 2014-01-16 (×3): qty 1

## 2014-01-16 MED ORDER — ARIPIPRAZOLE 5 MG PO TABS
5.0000 mg | ORAL_TABLET | Freq: Every day | ORAL | Status: DC
  Administered 2014-01-16: 5 mg via ORAL
  Filled 2014-01-16 (×2): qty 1

## 2014-01-16 MED ORDER — ACTIVE PARTNERSHIP FOR HEALTH OF YOUR HEART BOOK
Freq: Once | Status: AC
  Administered 2014-01-16: 18:00:00
  Filled 2014-01-16: qty 1

## 2014-01-16 MED ORDER — FUROSEMIDE 40 MG PO TABS
40.0000 mg | ORAL_TABLET | Freq: Two times a day (BID) | ORAL | Status: DC
  Administered 2014-01-16 – 2014-01-17 (×3): 40 mg via ORAL
  Filled 2014-01-16 (×5): qty 1

## 2014-01-16 MED ORDER — ARIPIPRAZOLE 5 MG PO TABS
5.0000 mg | ORAL_TABLET | Freq: Every day | ORAL | Status: DC
  Filled 2014-01-16: qty 1

## 2014-01-16 MED ORDER — METOCLOPRAMIDE HCL 10 MG PO TABS
10.0000 mg | ORAL_TABLET | Freq: Four times a day (QID) | ORAL | Status: DC
  Administered 2014-01-16 – 2014-01-17 (×6): 10 mg via ORAL
  Filled 2014-01-16 (×8): qty 1

## 2014-01-16 MED ORDER — ASPIRIN EC 81 MG PO TBEC
81.0000 mg | DELAYED_RELEASE_TABLET | Freq: Every day | ORAL | Status: DC
  Administered 2014-01-17: 81 mg via ORAL
  Filled 2014-01-16: qty 1

## 2014-01-16 MED ORDER — ASPIRIN EC 81 MG PO TBEC: 81.0000 mg | DELAYED_RELEASE_TABLET | Freq: Every day | ORAL | Status: DC

## 2014-01-16 MED ORDER — ALBUTEROL SULFATE (2.5 MG/3ML) 0.083% IN NEBU: 2.5000 mg | INHALATION_SOLUTION | Freq: Four times a day (QID) | RESPIRATORY_TRACT | Status: DC | PRN

## 2014-01-16 MED ORDER — NITROGLYCERIN 0.4 MG SL SUBL: 0.4000 mg | SUBLINGUAL_TABLET | SUBLINGUAL | Status: DC | PRN

## 2014-01-16 NOTE — Progress Notes (Signed)
Primary cardiologist: Dr. Nicholes Mangoan Bensimhon  Subjective:    Patient denies any chest pain this morning, no dizziness. States that the discomfort that brought him in may have been "gas." He did drink some water with improvement.  Objective:   Temp:  [97.4 F (36.3 C)-97.8 F (36.6 C)] 97.4 F (36.3 C) (02/28 0700) Pulse Rate:  [35-48] 44 (02/28 0700) Resp:  [14-16] 16 (02/28 0700) BP: (83-106)/(58-70) 104/70 mmHg (02/28 1022) SpO2:  [96 %-100 %] 100 % (02/28 0700) Weight:  [161 lb 6.4 oz (73.211 kg)] 161 lb 6.4 oz (73.211 kg) (02/28 0900) Last BM Date: 01/15/14  Filed Weights   01/16/14 0900  Weight: 161 lb 6.4 oz (73.211 kg)   No intake or output data in the 24 hours ending 01/16/14 1048  Telemetry: Probable atrial flutter with slow ventricular response, heart rate in the 40s now.  Exam:  General: No distress.  Lungs: Clear, nonlabored.  Cardiac: Slow regular rhythm, soft apical systolic murmur.  Extremities: No pitting edema.   Lab Results:  Basic Metabolic Panel:  Recent Labs Lab 01/16/14 0447 01/16/14 0845  NA 140 137  K 4.9 4.6  CL 104 104  CO2 24 20  GLUCOSE 125* 144*  BUN 36* 37*  CREATININE 3.24* 3.24*  CALCIUM 9.1 8.7  MG  --  2.3    Liver Function Tests:  Recent Labs Lab 01/16/14 0845  AST 17  ALT 13  ALKPHOS 149*  BILITOT 0.6  PROT 6.6  ALBUMIN 2.9*    CBC:  Recent Labs Lab 01/16/14 0447 01/16/14 0845  WBC 7.0 6.5  HGB 13.9 12.5*  HCT 41.7 37.3*  MCV 83.7 83.4  PLT 115* 106*    Cardiac Enzymes:  Recent Labs Lab 01/16/14 0441 01/16/14 0845  TROPONINI <0.30 <0.30    BNP:  Recent Labs  12/08/13 1338 01/16/14 0441 01/16/14 0845  PROBNP 4938.0* 7170.0* 5554.0*    ECG: Most recent tracing reviewed, actually shows a probable atypical atrial flutter with slow ventricular response at 37, cannot rule out associated heart block.   PORTABLE CHEST - 1 VIEW  COMPARISON: Chest radiograph performed 12/08/2013    FINDINGS:  The lungs are well-aerated. Minimal right basilar opacity likely  reflects atelectasis. There is no evidence of pleural effusion or  pneumothorax.  The cardiomediastinal silhouette is mildly enlarged. The patient is  status post median sternotomy. Two valve replacements are seen. No  acute osseous abnormalities are seen. External pacing pads are  noted.  IMPRESSION:  Minimal right basilar airspace opacity likely reflects atelectasis.  Mild cardiomegaly noted.    Medications:   Scheduled Medications: . ARIPiprazole  5 mg Oral Daily  . [START ON 01/17/2014] aspirin EC  81 mg Oral Daily  . furosemide  40 mg Oral BID  . heparin  5,000 Units Subcutaneous 3 times per day  . hydrALAZINE  50 mg Oral BID  . hydrOXYzine  50 mg Oral QHS  . metoCLOPramide  10 mg Oral QID  . pantoprazole  40 mg Oral Daily      PRN Medications:  acetaminophen, albuterol, hydrocortisone cream, nitroGLYCERIN   Assessment:   1. Presentation with chest pain and bradycardia. Troponin I levels negative for ACS so far. Recent outpatient medications included Coreg 25 mg twice daily - currently held.  2. Chronic atrial flutter, has had issues with both rapid ventricular response and also intermittent bradycardia based on record review - likely conduction system disease. Has not been anticoagulated due to noncompliance, no recent  plans for cardioversion or ablation. Was on amiodarone and cardioverted back in January 2014.  3.  Valvular heart disease in including mitral valve repair in 2011, followed by bioprosthetic MVR with tricuspid valve repair in 2013.  4. Schizophrenia.  5. 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 Creatinine 3.2.  6. History of noncompliance.  7. History of cardiomyopathy with improvement in LVEF the range of 50-55% as of May 2014.   Plan/Discussion:    Patient admitted earlier this morning by the cardiology fellow. Chest pain has resolved and at this point there is no  clear evidence of ACS. Question remaining is how to manage his atrial flutter. For the time being he is off Coreg, he tells that he was not taking Cardizem CD at home. I have recommended that we follow telemetry throughout the day, repeat ECG in the morning. This may be a situation where we can add back lower dose Coreg and have him go home for outpatient followup and perhaps additional cardiac monitoring.   Jonelle Sidle, M.D., F.A.C.C.

## 2014-01-16 NOTE — H&P (Addendum)
Ryan Winters is an 53 y.o. male.    Chief Complaint: chest pain and found to have bradycardia Primary Cardiologist: Bensimhon HPI: Ryan Winters is a 53 yo man with multiple medical problems, prior MV repair x2, schizophrenia, chronic systolic with recovery and diastolic HF, last known EF 25-30% recovered to 5/14 EF 50-55%, chronic CKD with creatinine 3s, cocaine abuse recently hospitalized 1/20-1/24 for atrial flutter/tachycardia with RVR ultimately left AMA while being treated with diltiazem and coreg off anticoagulation 2/2 poor candidacy and inconsistent medication compliance. He woke up with chest pain at 1 am this AM and came to the ER and found to have HR in the 30s and the ER asked cardiology to observe overnight for junctional bradycardia. CP described as pressure sensation, lasting a few minutes, resolved gradually. ? Associated SOB. He has some SOB at home with activity, probably stable. He tells me he doesn't think he is swollen or has gained any weight. He tells me he is compliant with him medications and appears to know some doses but not in detail.    Past Medical History  Diagnosis Date  . Hypertension   . Bipolar disorder   . Schizophrenia     h/o hospitalized inpt therapy @ the [facility]  . Valvular heart disease     a. 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 b. 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  Marland Kitchen Hyperlipidemia   . H/O: suicide attempt 1998    was hospitalized at [facility]  . Anemia     Hx of iron infusions  . Gastroparesis   . Arthritis   . Cocaine abuse     per pt none x 3 yrs however continues to "handle" it and deal it with + UDS.  Marland Kitchen Anxiety   . CHF (congestive heart failure)     a. EF 25-30% by echo 12/02/12, but 55% by TEE 12/04/12.  Marland Kitchen GERD (gastroesophageal reflux disease)   . Depression   . Vertigo   . Itchy skin   . Peripheral neuropathy     BLE  . Bilateral lower abdominal cramping   . Shortness of breath    "periodically; happens at any time" (03/30/13)  . Generalized headaches     "monthly" (03/30/2013)  . Chronic kidney disease     has fistula, not on HD; "go to [facility] q 90 days to have kidneys checked out" (03/30/2013)  . Atrial tachycardia     a. dx 11/2012: tx with amiodarone, cardioversion (discharge rhythm: likely junctional).    Past Surgical History  Procedure Laterality Date  . Mitral valve repair  05/2010  . Av fistula placement, radiocephalic Left 05/24/2011    arm; "aien't never had to use it" (03/30/2013)  . Laceration repair Right ~ 1979  . Umbilical hernia repair  04/01/2012    Procedure: HERNIA REPAIR UMBILICAL ADULT;  Surgeon: Ardeth Sportsman, MD;  Location: [facility] OR;  Service: General;  Laterality: N/A;  supra umbilical  . Tee without cardioversion  08/25/2012    Procedure: TRANSESOPHAGEAL ECHOCARDIOGRAM (TEE);  Surgeon: Wendall Stade, MD;  Location: [facility] ENDOSCOPY;  Service: Cardiovascular;  Laterality: N/A;  . Mitral valve replacement  09/23/2012    Procedure: REDO MITRAL VALVE REPLACEMENT (MVR);  Surgeon: [name], MD;  Location[facility] OR;  Service: Open Heart Surgery;  Laterality: N/A;  Nitric Oxide  . Tricuspid valve replacement  09/23/2012    Procedure: TRICUSPID VALVE REPAIR;  Surgeon: [name], MD;  Location:  [facility] OR;  Service: Open Heart Surgery;  Laterality: N/A;  . Tee without cardioversion  12/04/2012    Procedure: TRANSESOPHAGEAL ECHOCARDIOGRAM (TEE);  Surgeon: Laurey Moralealton S McLean, MD;  [facility];  Service: Cardiovascular;  Laterality: N/A;  CV 1015/vicki, dl  . Cardioversion  1/61/09601/16/2014    Procedure: CARDIOVERSION;  Surgeon: Laurey Moralealton S McLean, MD;  [facility];  Service: Cardiovascular;  Laterality: N/A;  . Cardiac valve replacement      mitral  . Cardiac catheterization  05/26/2010    Hattie Perch/notes 03/30/2013    Family History  Problem Relation Age of Onset  . Hypertension Mother   . Heart disease Mother   . Heart disease Brother     Social History:  reports that he quit smoking about 2 years ago. His smoking use included Cigarettes. He has a 45 pack-year smoking history. He has quit using smokeless tobacco. He reports that he uses illicit drugs (Cocaine and Marijuana). He reports that he does not drink alcohol.  Allergies:  Allergies  Allergen Reactions  . Cashew Nut Oil Anaphylaxis  . Food Anaphylaxis    Strawberries, Cashews, causes throat to swell - affects breathing     (Not in a hospital admission)  Results for orders placed during the hospital encounter of 01/16/14 (from the past 48 hour(s))  CBC WITH DIFFERENTIAL     Status: Abnormal   Collection Time    01/16/14  4:47 AM      Result Value Ref Range   WBC 7.0  4.0 - 10.5 K/uL   RBC 4.98  4.22 - 5.81 MIL/uL   Hemoglobin 13.9  13.0 - 17.0 g/dL   HCT 45.441.7  09.839.0 - 11.952.0 %   MCV 83.7  78.0 - 100.0 fL   MCH 27.9  26.0 - 34.0 pg   MCHC 33.3  30.0 - 36.0 g/dL   RDW 14.717.2 (*) 82.911.5 - 56.215.5 %   Platelets 115 (*) 150 - 400 K/uL   Comment: CONSISTENT WITH PREVIOUS RESULT   Neutrophils Relative % 70  43 - 77 %   Neutro Abs 4.8  1.7 - 7.7 K/uL   Lymphocytes Relative 17  12 - 46 %   Lymphs Abs 1.2  0.7 - 4.0 K/uL   Monocytes Relative 9  3 - 12 %   Monocytes Absolute 0.6  0.1 - 1.0 K/uL   Eosinophils Relative 5  0 - 5 %   Eosinophils Absolute 0.3  0.0 - 0.7 K/uL   Basophils Relative 0  0 - 1 %   Basophils Absolute 0.0  0.0 - 0.1 K/uL   Dg Chest Port 1 View  01/16/2014   CLINICAL DATA:  Shortness of breath.  EXAM: PORTABLE CHEST - 1 VIEW  COMPARISON:  Chest radiograph performed 12/08/2013  FINDINGS: The lungs are well-aerated. Minimal right basilar opacity likely reflects atelectasis. There is no evidence of pleural effusion or pneumothorax.  The cardiomediastinal silhouette is mildly enlarged. The patient is status post median sternotomy. Two valve replacements are seen. No acute osseous abnormalities are seen. External pacing pads are noted.  IMPRESSION: Minimal  right basilar airspace opacity likely reflects atelectasis. Mild cardiomegaly noted.   Electronically Signed   By: Roanna RaiderJeffery  Chang M.D.   On: 01/16/2014 05:02    Review of Systems  Constitutional: Negative for fever, chills and weight loss.  HENT: Negative for ear pain.   Eyes: Negative for blurred vision and pain.  Respiratory: Positive for shortness of breath. Negative for cough and hemoptysis.  Cardiovascular: Positive for chest pain. Negative for palpitations and orthopnea.  Gastrointestinal: Negative for nausea, vomiting and abdominal pain.  Genitourinary: Negative for dysuria and hematuria.  Musculoskeletal: Negative for myalgias and neck pain.  Skin: Negative for rash.  Neurological: Negative for dizziness, tingling and headaches.  Endo/Heme/Allergies: Negative for environmental allergies. Does not bruise/bleed easily.  Psychiatric/Behavioral: Positive for substance abuse. Negative for depression and suicidal ideas.    Blood pressure 86/62, pulse 45, temperature 97.8 F (36.6 C), temperature source Oral, resp. rate 14, SpO2 99.00%. Physical Exam  Nursing note and vitals reviewed. Constitutional: He is oriented to person, place, and time. He appears well-developed and well-nourished. No distress.  HENT:  Head: Normocephalic and atraumatic.  Nose: Nose normal.  Mouth/Throat: Oropharynx is clear and moist. No oropharyngeal exudate.  Eyes: Conjunctivae and EOM are normal. Pupils are equal, round, and reactive to light. No scleral icterus.  Neck: Normal range of motion. Neck supple. JVD present. No tracheal deviation present.  Cardiovascular: Regular rhythm, normal heart sounds and intact distal pulses.  Exam reveals no gallop.   No murmur heard. bradycardia  Respiratory: Effort normal and breath sounds normal. No respiratory distress. He has no wheezes.  GI: Soft. Bowel sounds are normal. He exhibits no distension. There is no tenderness. There is no rebound.  Musculoskeletal:  Normal range of motion. He exhibits no edema and no tenderness.  Neurological: He is alert and oriented to person, place, and time. No cranial nerve deficit. Coordination normal.  Skin: Skin is warm and dry. No rash noted. He is not diaphoretic. No erythema.  Psychiatric: He has a normal mood and affect. His behavior is normal. Thought content normal.    Labs reviewed as above; wbc 7 Chest x-ray: no acute process  Problem List Bradycardia - junctional Acute on chronic systolic heart failure (recovered EF) and diastolic HF Chronic CKD stage IV Schizophrenia MV repair x 2 Cocaine abuse  Assessment/Plan 53 yo man with multiple medical problems including chronic CKD, systolic and diastolic HF, schizophrenia, MV repair, cocaine abuse and atrial flutter now here with bradycardia and chest pain. Differential for bradycardia is almost certainly medications given concomitant treatment with CCB and BB and patient with known noncompliance. In setting of chest pain, will trend cardiac markers overnight, watch on telemetry, hold AV nodal blockers for now but he will need BB or CCB for rate control. He's off anticoagulation given very poor noncompliance.  - trend cardiac biomarkers, telemetry observation - holding diltiazem and metoprolol given bradycardia but will likely restart later today as HR recovers with lower dose and likely just one agent; home dose of coreg 25 mg bid and diltiazem 240 mg bid - continue hydralazine 50 mg bid (home is 100 mg but BP on arrival 90s/50s) - continue home lasix 40 mg bid but may need IV - continue other home medications - encourage cocaine abstinence  Ryan Winters 01/16/2014, 5:19 AM

## 2014-01-16 NOTE — ED Provider Notes (Signed)
CSN: [id number]     Arrival date & time 01/16/14  0434 History   First MD Initiated Contact with Patient 01/16/14 [phone]     Chief Complaint  Patient presents with  . Chest Pain  . Bradycardia     (Consider location/radiation/quality/duration/timing/severity/associated sxs/prior Treatment) HPI 53 year old male presents to the emergency apartment from home via EMS with complaint of chest pain.  EMS reports upon their arrival, he was noted to be bradycardic in the 30s.  Patient with onset of chest pain this morning around 1 AM.  The second episode just prior to arrival.  Patient complains of mild shortness of breath.  No prior history of bradycardia.  Patient received aspirin via EMS.  The treatment other than that in route.  Patient is awake and alert, is not having any ill effects from the bradycardia.  Patient had has history of mitral valve repair x2, cocaine abuse, congestive heart failure with an EF of 25-30%, hypertension, and schizophrenia.  Patient was admitted to the hospital at the end of January with CHF exacerbation, and left AMA at that time.  Patient denies taking extra doses of his Coreg, but thinks he may have taken it late this evening.  He denies any polysubstance abuse. Past Medical History  Diagnosis Date  . Hypertension   . Bipolar disorder   . Schizophrenia     h/o hospitalized inpt therapy @ the [facility]  . Valvular heart disease     a. 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 b. 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  Marland Kitchen Hyperlipidemia   . H/O: suicide attempt 1998    was hospitalized at [facility]  . Anemia     Hx of iron infusions  . Gastroparesis   . Arthritis   . Cocaine abuse     per pt none x 3 yrs however continues to "handle" it and deal it with + UDS.  Marland Kitchen Anxiety   . CHF (congestive heart failure)     a. EF 25-30% by echo 12/02/12, but 55% by TEE 12/04/12.  Marland Kitchen GERD (gastroesophageal reflux disease)   . Depression   .  Vertigo   . Itchy skin   . Peripheral neuropathy     BLE  . Bilateral lower abdominal cramping   . Shortness of breath     "periodically; happens at any time" (03/30/13)  . Generalized headaches     "monthly" (03/30/2013)  . Chronic kidney disease     has fistula, not on HD; "go to [facility] q 90 days to have kidneys checked out" (03/30/2013)  . Atrial tachycardia     a. dx 11/2012: tx with amiodarone, cardioversion (discharge rhythm: likely junctional).   Past Surgical History  Procedure Laterality Date  . Mitral valve repair  05/2010  . Av fistula placement, radiocephalic Left 05/24/2011    arm; "aien't never had to use it" (03/30/2013)  . Laceration repair Right ~ 1979  . Umbilical hernia repair  04/01/2012    Procedure: HERNIA REPAIR UMBILICAL ADULT;  Surgeon: Ardeth Sportsman, MD;  Location: [facility] OR;  Service: General;  Laterality: N/A;  supra umbilical  . Tee without cardioversion  08/25/2012    Procedure: TRANSESOPHAGEAL ECHOCARDIOGRAM (TEE);  Surgeon: Wendall Stade, MD;  Location: [facility] ENDOSCOPY;  Service: Cardiovascular;  Laterality: N/A;  . Mitral valve replacement  09/23/2012    Procedure: REDO MITRAL VALVE REPLACEMENT (MVR);  Surgeon: [name], MD;  Location: [facility] OR;  Service: Open  Heart Surgery;  Laterality: N/A;  Nitric Oxide  . Tricuspid valve replacement  09/23/2012    Procedure: TRICUSPID VALVE REPAIR;  Surgeon: [name], MD;  Location: [facility] OR;  Service: Open Heart Surgery;  Laterality: N/A;  . Tee without cardioversion  12/04/2012    Procedure: TRANSESOPHAGEAL ECHOCARDIOGRAM (TEE);  Surgeon: Laurey Morale, MD;  Location: [facility] ENDOSCOPY;  Service: Cardiovascular;  Laterality: N/A;  CV 1015/vicki, dl  . Cardioversion  1/61/0960    Procedure: CARDIOVERSION;  Surgeon: Laurey Morale, MD;  Location: [facility] ENDOSCOPY;  Service: Cardiovascular;  Laterality: N/A;  . Cardiac valve replacement      mitral  . Cardiac catheterization  05/26/2010    Hattie Perch 03/30/2013   Family  History  Problem Relation Age of Onset  . Hypertension Mother   . Heart disease Mother   . Heart disease Brother    History  Substance Use Topics  . Smoking status: Former Smoker -- 1.50 packs/day for 30 years    Types: Cigarettes    Quit date: 03/21/2011  . Smokeless tobacco: Former Neurosurgeon  . Alcohol Use: No     Comment: Quit alcohol in July of 2011    Review of Systems   See History of Present Illness; otherwise all other systems are reviewed and negative  Allergies  Cashew nut oil and Food  Home Medications  No current outpatient prescriptions on file. BP 106/58  Pulse 44  Temp(Src) 97.4 F (36.3 C) (Oral)  Resp 16  SpO2 100% Physical Exam  Nursing note and vitals reviewed. Constitutional: He is oriented to person, place, and time. He appears well-developed and well-nourished.  Patient with some signs of tardive dyskinesia with teeth grinding, and grunting  HENT:  Head: Normocephalic and atraumatic.  Nose: Nose normal.  Mouth/Throat: Oropharynx is clear and moist.  Eyes: Conjunctivae and EOM are normal. Pupils are equal, round, and reactive to light.  Neck: Normal range of motion. Neck supple. No JVD present. No tracheal deviation present. No thyromegaly present.  Cardiovascular: Normal rate, regular rhythm and intact distal pulses.  Exam reveals no gallop and no friction rub.   Murmur heard. Bradycardia noted  Pulmonary/Chest: Effort normal and breath sounds normal. No stridor. No respiratory distress. He has no wheezes. He has no rales. He exhibits no tenderness.  Abdominal: Soft. Bowel sounds are normal. He exhibits no distension and no mass. There is no tenderness. There is no rebound and no guarding.  Musculoskeletal: Normal range of motion. He exhibits no edema and no tenderness.  Lymphadenopathy:    He has no cervical adenopathy.  Neurological: He is alert and oriented to person, place, and time. He exhibits normal muscle tone. Coordination normal.  Skin:  Skin is warm and dry. No rash noted. No erythema. No pallor.  Psychiatric: His behavior is normal.  Flat affect, poor insight and judgment    ED Course  Procedures (including critical care time)  CRITICAL CARE Performed by: Olivia Mackie Total critical care time: 90 min Critical care time was exclusive of separately billable procedures and treating other patients. Critical care was necessary to treat or prevent imminent or life-threatening deterioration. Critical care was time spent personally by me on the following activities: development of treatment plan with patient and/or surrogate as well as nursing, discussions with consultants, evaluation of patient's response to treatment, examination of patient, obtaining history from patient or surrogate, ordering and performing treatments and interventions, ordering and review of laboratory studies, ordering and review of radiographic studies, pulse  oximetry and re-evaluation of patient's condition.  Labs Review Labs Reviewed  CBC WITH DIFFERENTIAL - Abnormal; Notable for the following:    RDW 17.2 (*)    Platelets 115 (*)    All other components within normal limits  BASIC METABOLIC PANEL - Abnormal; Notable for the following:    Glucose, Bld 125 (*)    BUN 36 (*)    Creatinine, Ser 3.24 (*)    GFR calc non Af Amer 20 (*)    GFR calc Af Amer 24 (*)    All other components within normal limits  PRO B NATRIURETIC PEPTIDE - Abnormal; Notable for the following:    Pro B Natriuretic peptide (BNP) 7170.0 (*)    All other components within normal limits  TROPONIN I  URINE RAPID DRUG SCREEN (HOSP PERFORMED)  TROPONIN I  TROPONIN I  TROPONIN I  PROTIME-INR  APTT  TSH  COMPREHENSIVE METABOLIC PANEL  MAGNESIUM  HEMOGLOBIN A1C  PRO B NATRIURETIC PEPTIDE  CBC   Imaging Review Dg Chest Port 1 View  01/16/2014   CLINICAL DATA:  Shortness of breath.  EXAM: PORTABLE CHEST - 1 VIEW  COMPARISON:  Chest radiograph performed 12/08/2013   FINDINGS: The lungs are well-aerated. Minimal right basilar opacity likely reflects atelectasis. There is no evidence of pleural effusion or pneumothorax.  The cardiomediastinal silhouette is mildly enlarged. The patient is status post median sternotomy. Two valve replacements are seen. No acute osseous abnormalities are seen. External pacing pads are noted.  IMPRESSION: Minimal right basilar airspace opacity likely reflects atelectasis. Mild cardiomegaly noted.   Electronically Signed   By: Roanna RaiderJeffery  Chang M.D.   On: 01/16/2014 05:02    EKG Interpretation  Date/Time:  Saturday January 16 2014 04:53:17 EST Ventricular Rate:  37 PR Interval:  193 QRS Duration: 94 QT Interval:  589 QTC Calculation: 462 R Axis:   75 Text Interpretation:   ectopic atrial bradycardia RSR' in V1 or V2, probably normal variant Left ventricular hypertrophy bradycardia new from prior Confirmed by Mikka Kissner  MD, Braydee Shimkus ([id number]) on 01/16/2014 8:35:58 AM     MDM   Final diagnoses:  Bradycardia  Diastolic CHF, acute on chronic  Chest pain    53 year old male with significant bradycardia.  He is not symptomatic with this however, his blood pressure is slightly low, but has a normal mentation.  After 0.5 mg of atropine, his heart rate, increased into the low 50s.  Patient was discussed with cardiology.  Given his history of schizophrenia, and polysubstance abuse, and noncompliance.  It is possible that he inadvertently took extra Coreg causing his current symptoms.  Patient to be admitted to cardiology service.    Olivia Mackielga M Jade Burright, MD 01/16/14 [phone]

## 2014-01-16 NOTE — ED Notes (Addendum)
Pt. presents with EMS from home reports mid chest pain onset 1 am this morning , slight SOB , bradycardia = 30's . Alert and oriented at arrival , denies chest pain , slight SOB . Pt. received ASA 324 mg po by EMS .

## 2014-01-16 NOTE — Progress Notes (Signed)
Utilization Review Completed.   Amiir Heckard, RN, BSN Nurse Case Manager  

## 2014-01-17 LAB — BASIC METABOLIC PANEL
BUN: 46 mg/dL — ABNORMAL HIGH (ref 6–23)
CALCIUM: 9.2 mg/dL (ref 8.4–10.5)
CO2: 21 mEq/L (ref 19–32)
Chloride: 105 mEq/L (ref 96–112)
Creatinine, Ser: 3.27 mg/dL — ABNORMAL HIGH (ref 0.50–1.35)
GFR calc non Af Amer: 20 mL/min — ABNORMAL LOW (ref >90)
GFR, EST AFRICAN AMERICAN: 23 mL/min — AB (ref >90)
GLUCOSE: 91 mg/dL (ref 70–99)
POTASSIUM: 4.1 meq/L (ref 3.7–5.3)
Sodium: 141 mEq/L (ref 137–147)

## 2014-01-17 LAB — CBC
HCT: 34.8 % — ABNORMAL LOW (ref 39.0–52.0)
HEMOGLOBIN: 11.6 g/dL — AB (ref 13.0–17.0)
MCH: 27.4 pg (ref 26.0–34.0)
MCHC: 33.3 g/dL (ref 30.0–36.0)
MCV: 82.3 fL (ref 78.0–100.0)
PLATELETS: 111 10*3/uL — AB (ref 150–400)
RBC: 4.23 MIL/uL (ref 4.22–5.81)
RDW: 17 % — ABNORMAL HIGH (ref 11.5–15.5)
WBC: 6.5 10*3/uL (ref 4.0–10.5)

## 2014-01-17 MED ORDER — CARVEDILOL 3.125 MG PO TABS
3.1250 mg | ORAL_TABLET | Freq: Two times a day (BID) | ORAL | Status: DC
  Administered 2014-01-17: 3.125 mg via ORAL
  Filled 2014-01-17 (×3): qty 1

## 2014-01-17 MED ORDER — CARVEDILOL 12.5 MG PO TABS
12.5000 mg | ORAL_TABLET | Freq: Two times a day (BID) | ORAL | Status: DC
Start: 2014-01-17 — End: 2014-01-17
  Filled 2014-01-17 (×2): qty 1

## 2014-01-17 MED ORDER — CARVEDILOL 25 MG PO TABS: 12.5000 mg | ORAL_TABLET | Freq: Two times a day (BID) | ORAL | Status: DC

## 2014-01-17 MED ORDER — HYDRALAZINE HCL 100 MG PO TABS: 50.0000 mg | ORAL_TABLET | Freq: Two times a day (BID) | ORAL | Status: AC

## 2014-01-17 NOTE — Progress Notes (Signed)
Cardiology On-Call Note  I was contacted by the patient's RN regarding tachycardia and elevated blood pressure.  The patient was noted to have a HR of 110-120 bpm without any symptoms.  His EKG shows atypical flutter with 2:1 conduction.  Of note, he was admitted with bradycardia yesterday, likely due to atrial flutter with slow conduction in the setting of high-dose carvedilol use.  Will restart carvedilol 3.125 mg BID and continue tele monitoring.

## 2014-01-17 NOTE — Progress Notes (Signed)
Cardiologist restarted on coreg 3.125 mg BID with meals. Sanda LingerMilam, Kaulana Brindle R, RN

## 2014-01-17 NOTE — Plan of Care (Signed)
Problem: Phase II Progression Outcomes Goal: Anginal pain relieved Pt cp free so far

## 2014-01-17 NOTE — Progress Notes (Signed)
Primary cardiologist: Dr. Nicholes Mangoan Bensimhon  Subjective:    States he feels good this morning. No chest pain or palpitations, no shortness of breath.  Objective:   Temp:  [97.5 F (36.4 C)-98.6 F (37 C)] 98.4 F (36.9 C) (03/01 0559) Pulse Rate:  [56-116] 116 (03/01 0559) Resp:  [16-20] 20 (03/01 0559) BP: (104-151)/(70-109) 151/109 mmHg (03/01 0559) SpO2:  [98 %-100 %] 99 % (03/01 0559) Weight:  [164 lb 6.2 oz (74.566 kg)] 164 lb 6.2 oz (74.566 kg) (03/01 0559) Last BM Date: 01/16/14  Filed Weights   01/16/14 0900 01/17/14 0559  Weight: 161 lb 6.4 oz (73.211 kg) 164 lb 6.2 oz (74.566 kg)    Intake/Output Summary (Last 24 hours) at 01/17/14 [id number] Last data filed at 01/17/14 0605  Gross per 24 hour  Intake    480 ml  Output    625 ml  Net   -145 ml    Telemetry: Atypical atrial flutter, heart rate 115.  Exam:  General: No distress.  Lungs: Clear, nonlabored.  Cardiac: Slow regular rhythm, soft apical systolic murmur.  Extremities: No pitting edema.   Lab Results:  Basic Metabolic Panel:  Recent Labs Lab 01/16/14 0447 01/16/14 0845 01/17/14 0435  NA 140 137 141  K 4.9 4.6 4.1  CL 104 104 105  CO2 24 20 21   GLUCOSE 125* 144* 91  BUN 36* 37* 46*  CREATININE 3.24* 3.24* 3.27*  CALCIUM 9.1 8.7 9.2  MG  --  2.3  --     Liver Function Tests:  Recent Labs Lab 01/16/14 0845  AST 17  ALT 13  ALKPHOS 149*  BILITOT 0.6  PROT 6.6  ALBUMIN 2.9*    CBC:  Recent Labs Lab 01/16/14 0447 01/16/14 0845 01/17/14 0435  WBC 7.0 6.5 6.5  HGB 13.9 12.5* 11.6*  HCT 41.7 37.3* 34.8*  MCV 83.7 83.4 82.3  PLT 115* 106* 111*    Cardiac Enzymes:  Recent Labs Lab 01/16/14 0845 01/16/14 1408 01/16/14 1815  TROPONINI <0.30 <0.30 <0.30    BNP:  Recent Labs  12/08/13 1338 01/16/14 0441 01/16/14 0845  PROBNP 4938.0* 7170.0* 5554.0*    ECG: Atypical atrial flutter with 2:1 block, heart rate 115.   PORTABLE CHEST - 1 VIEW  COMPARISON:  Chest radiograph performed 12/08/2013  FINDINGS:  The lungs are well-aerated. Minimal right basilar opacity likely  reflects atelectasis. There is no evidence of pleural effusion or  pneumothorax.  The cardiomediastinal silhouette is mildly enlarged. The patient is  status post median sternotomy. Two valve replacements are seen. No  acute osseous abnormalities are seen. External pacing pads are  noted.  IMPRESSION:  Minimal right basilar airspace opacity likely reflects atelectasis.  Mild cardiomegaly noted.    Medications:   Scheduled Medications: . ARIPiprazole  5 mg Oral QHS  . aspirin EC  81 mg Oral Daily  . carvedilol  3.125 mg Oral BID WC  . furosemide  40 mg Oral BID  . heparin  5,000 Units Subcutaneous 3 times per day  . hydrALAZINE  50 mg Oral BID  . hydrOXYzine  50 mg Oral QHS  . metoCLOPramide  10 mg Oral QID  . pantoprazole  40 mg Oral Daily     PRN Medications: acetaminophen, albuterol, docusate sodium, hydrocortisone cream, nitroGLYCERIN, white petrolatum   Assessment:   1. Presentation with chest pain and bradycardia. Troponin I levels negative for ACS so far. Recent outpatient medications included Coreg 25 mg twice daily - held visually  and then resumed at lower dose following return to RVR.  2. Chronic atrial flutter, has had issues with both rapid ventricular response and also intermittent bradycardia based on record review - likely conduction system disease. Has not been anticoagulated due to noncompliance, no recent plans for cardioversion or ablation. Was on amiodarone and cardioverted back in January 2014.  3.  Valvular heart disease in including mitral valve repair in 2011, followed by bioprosthetic MVR with tricuspid valve repair in 2013.  4. Schizophrenia.  5. 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 Creatinine 3.2.  6. History of noncompliance.  7. History of cardiomyopathy with improvement in LVEF the range of 50-55% as of May  2014.   Plan/Discussion:    Patient insists on going home today, states he feels good. Somewhat difficult to know how to manage his heart rhythm control, have elected to try Coreg 12.5 mg twice daily. He states that he was not taking any diltiazem CD at home. He will need to have a followup within the next week in the cardiology clinic, not entirely clear that it needs to be Dr. Gala Romney however - does not look like he has been following him in clinic. He is to be established with a general cardiologist in our [place] office. With evidence of tachycardia-bradycardia syndrome, device could always be considered, but I would have concerns about this with his history of noncompliance.  Jonelle Sidle, M.D., F.A.C.C.

## 2014-01-17 NOTE — Discharge Summary (Signed)
CARDIOLOGY DISCHARGE SUMMARY   Patient ID: Ryan Winters,  MRN: [id number], DOB/AGE: 53-27-1962 53 y.o.  Admit date: 01/16/2014 Discharge date: 01/17/2014  Primary Care Physician: Pleas Koch, MD Primary Cardiologist: Tonny Bollman, MD   Primary Discharge Diagnosis:  1. Chest pain. Resolved. Troponin I levels negative.  2. Chronic atrial flutter. Has had issues with both rapid ventricular response and also intermittent bradycardia based on record review - likely conduction system disease. Tachy-brady. Has not been anticoagulated due to noncompliance. No recent plans for cardioversion or ablation. Was on amiodarone and cardioverted back in January 2014.   Secondary Discharge Diagnoses:  1. Valvular heart disease s/p bioprosthetic MVR with tricuspid valve repair in 2013. 2. Schizophrenia.  3. ESRD/CKD, stage 5. Not on HD yet. 4. History of noncompliance.  5. History of cardiomyopathy with improvement in LVEF the range of 50-55% as of May 2014. 6. Polysubstance abuse.  Procedures This Admission:  None   History and Hospital Course:  Ryan Winters is a 53 yo man with multiple medical problems including chronic CKD, systolic and diastolic HF, schizophrenia, valvular heart disease s/p MVR, cocaine abuse, atrial flutter and medical noncompliance who presented with bradycardia and chest pain. He was recently hospitalized 1/20-1/24 for atrial flutter/tachycardia with RVR and ultimately left AMA while being treated with diltiazem and coreg off anticoagulation due to medication noncompliance. Shortly after admission his chest pain resolve. Serial troponin negative. He remained on telemetry with persistent atrial flutter with SVR, V rate in 53s. His BB was held. Overnight he developed RVR and his BB was resumed at a lower dose. Ryan Winters was 53 asymptomatic. He remains hemodynamically stable. This AM he has been seen, examined and deemed stable for discharge home today by Dr. Nona Dell.  He will continue coreg 12.5 mg twice daily. No Cardizem. He will follow-up with Norma Fredrickson, NP (has seen before in clinic) or Dr. Excell Seltzer in 1-2 weeks.   Discharge Vitals: Blood pressure 148/104, pulse 116, temperature 98.4 F (36.9 C), temperature source Oral, resp. rate 20, height 5\' 6"  (1.676 m), weight 164 lb 6.2 oz (74.566 kg), SpO2 99.00%.   Labs: Lab Results  Component Value Date   WBC 6.5 01/17/2014   HGB 11.6* 01/17/2014   HCT 34.8* 01/17/2014   MCV 82.3 01/17/2014   PLT 111* 01/17/2014     Recent Labs Lab 01/16/14 0845 01/17/14 0435  NA 137 141  K 4.6 4.1  CL 104 105  CO2 20 21  BUN 37* 46*  CREATININE 3.24* 3.27*  CALCIUM 8.7 9.2  PROT 6.6  --   BILITOT 0.6  --   ALKPHOS 149*  --   ALT 13  --   AST 17  --   GLUCOSE 144* 91   Lab Results  Component Value Date   CKTOTAL 104 05/10/2012   CKMB 2.9 05/10/2012   TROPONINI <0.30 01/16/2014    Recent Labs  01/16/14 0845  INR 1.16    Disposition:  The patient is being discharged in stable condition.  Follow-up:     Follow-up Information   Follow up with Tonny Bollman, MD On 01/26/2014. (At 9:00 AM for hospital follow-up and to re-establish cardiology care)    Specialty:  Cardiology   Contact information:   [address] [place] [address] [phone]      Discharge Medications:    Medication List    STOP taking these medications       diltiazem 120 MG 12 hr capsule  Commonly  known as:  CARDIZEM SR      TAKE these medications       ABILIFY 5 MG tablet  Generic drug:  ARIPiprazole  Take 5 mg by mouth daily.     acetaminophen 325 MG tablet  Commonly known as:  TYLENOL  Take 650 mg by mouth every 6 (six) hours as needed for pain.     albuterol 108 (90 BASE) MCG/ACT inhaler  Commonly known as:  PROVENTIL HFA;VENTOLIN HFA  Inhale 2 puffs into the lungs every 6 (six) hours as needed for wheezing.     aspirin EC 81 MG tablet  Take 81 mg by mouth daily.     carvedilol 25 MG  tablet  Commonly known as:  COREG  Take 0.5 tablets (12.5 mg total) by mouth 2 (two) times daily with a meal.     furosemide 40 MG tablet  Commonly known as:  LASIX  Take 40 mg by mouth 2 (two) times daily.     hydrALAZINE 100 MG tablet  Commonly known as:  APRESOLINE  Take 0.5 tablets (50 mg total) by mouth 2 (two) times daily.     hydrocortisone cream 1 %  Apply topically as needed for itching.     hydrOXYzine 50 MG capsule  Commonly known as:  VISTARIL  Take 50 mg by mouth at bedtime.     metoCLOPramide 10 MG tablet  Commonly known as:  REGLAN  Take 1 tablet (10 mg total) by mouth 4 (four) times daily.     omeprazole 40 MG capsule  Commonly known as:  PRILOSEC  Take 40 mg by mouth 2 (two) times daily as needed (heartburn).       Duration of Discharge Encounter: Greater than 30 minutes including physician time.  Signed, Rick DuffDMISTEN, Elsie Baynes, PA-C 01/17/2014, 12:23 PM

## 2014-01-17 NOTE — Progress Notes (Signed)
Cardiologist on call notified of pt having 10 beats of wide qrs complexes. Pt asymptomatic. Pt's bp also elevated at 151/109, hr 116. Will continue to monitor the pt. Sanda LingerMilam, Amarise Lillo R, RN

## 2014-01-17 NOTE — Discharge Summary (Signed)
See my rounding note.

## 2014-01-26 ENCOUNTER — Ambulatory Visit (INDEPENDENT_AMBULATORY_CARE_PROVIDER_SITE_OTHER): Payer: Medicaid Other | Admitting: Cardiovascular Disease

## 2014-01-26 ENCOUNTER — Encounter: Payer: Self-pay | Admitting: Cardiovascular Disease

## 2014-01-26 VITALS — BP 129/81 | HR 88 | Ht 66.0 in | Wt 168.1 lb

## 2014-01-26 DIAGNOSIS — I4892 Unspecified atrial flutter: Secondary | ICD-10-CM

## 2014-01-26 DIAGNOSIS — I509 Heart failure, unspecified: Secondary | ICD-10-CM

## 2014-01-26 DIAGNOSIS — I5043 Acute on chronic combined systolic (congestive) and diastolic (congestive) heart failure: Secondary | ICD-10-CM

## 2014-01-26 DIAGNOSIS — I5042 Chronic combined systolic (congestive) and diastolic (congestive) heart failure: Secondary | ICD-10-CM

## 2014-01-26 NOTE — Progress Notes (Signed)
HPI:  53 year old gentleman presenting for cardiac followup evaluation. The patient is followed for valvular heart disease and congestive heart failure. He has had 2 cardiac surgeries. Initially he underwent mitral valve repair and then developed severe recurrent mitral regurgitation. He underwent bioprosthetic mitral valve replacement and tricuspid annuloplasty in 2013. Other significant problems include malignant hypertension, stage IV chronic kidney disease, and ongoing issues with polysubstance abuse.  The patient was last hospitalized in January. He tells me he was "handling cocaine" at that time. He does not use other drugs. He has quit smoking. He has not used cocaine since January. His shortness of breath is stable. He is markedly limited with walking up hills or climbing stairs. Otherwise he denies symptoms with walking on level ground. He has chronic orthopnea without PND. He denies abdominal or leg swelling. At the time of his hospitalization, he had lightheadedness and weakness and was noted to have marked bradycardia. His medications were adjusted and he has had no further symptoms since then.  Outpatient Encounter Prescriptions as of 01/26/2014  Medication Sig  . acetaminophen (TYLENOL) 325 MG tablet Take 650 mg by mouth every 6 (six) hours as needed for pain.  Marland Kitchen. albuterol (PROVENTIL HFA;VENTOLIN HFA) 108 (90 BASE) MCG/ACT inhaler Inhale 2 puffs into the lungs every 6 (six) hours as needed for wheezing.  . ARIPiprazole (ABILIFY) 5 MG tablet Take 5 mg by mouth daily.   Marland Kitchen. aspirin EC 81 MG tablet Take 81 mg by mouth daily.  . carvedilol (COREG) 25 MG tablet Take 0.5 tablets (12.5 mg total) by mouth 2 (two) times daily with a meal.  . furosemide (LASIX) 40 MG tablet Take 40 mg by mouth 2 (two) times daily.  . hydrALAZINE (APRESOLINE) 100 MG tablet Take 0.5 tablets (50 mg total) by mouth 2 (two) times daily.  . hydrocortisone cream 1 % Apply topically as needed for itching.  . hydrOXYzine  (VISTARIL) 50 MG capsule Take 50 mg by mouth at bedtime.  . metoCLOPramide (REGLAN) 10 MG tablet Take 1 tablet (10 mg total) by mouth 4 (four) times daily.  Marland Kitchen. omeprazole (PRILOSEC) 40 MG capsule Take 40 mg by mouth 2 (two) times daily as needed (heartburn).    Allergies  Allergen Reactions  . Cashew Nut Oil Anaphylaxis  . Food Anaphylaxis    Strawberries, Cashews, causes throat to swell - affects breathing  . Strawberry Anaphylaxis    Past Medical History  Diagnosis Date  . Hypertension   . Bipolar disorder   . Schizophrenia     [facility]  . Valvular heart disease     a. 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 b. 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  Marland Kitchen. Hyperlipidemia   . H/O: suicide attempt 1998    [facility]  . Anemia     Hx of iron infusions  . Gastroparesis   . Arthritis   . Cocaine abuse     per pt none x 3 yrs however continues to "handle" it and deal it with + UDS.  Marland Kitchen. Anxiety   . CHF (congestive heart failure)     a. EF 25-30% by echo 12/02/12, but 55% by TEE 12/04/12.  Marland Kitchen. GERD (gastroesophageal reflux disease)   . Depression   . Vertigo   . Itchy skin   . Peripheral neuropathy     BLE  . Bilateral lower abdominal cramping   . Shortness of breath     "  periodically; happens at any time" (03/30/13)  . Generalized headaches     "monthly" (03/30/2013)  . Chronic kidney disease     has fistula, not on HD; "go to [facility] q 90 days to have kidneys checked out" (03/30/2013)  . Atrial tachycardia     a. dx 11/2012: tx with amiodarone, cardioversion (discharge rhythm: likely junctional).    ROS: Negative except as per HPI  BP 129/81  Pulse 88  Ht 5\' 6"  (1.676 m)  Wt 168 lb 1.9 oz (76.259 kg)  BMI 27.15 kg/m2  PHYSICAL EXAM: Pt is alert and oriented, NAD HEENT: normal Neck: JVP - normal, carotids 2+= without bruits Lungs: CTA bilaterally CV: RRR without murmur or  gallop Abd: soft, NT, Positive BS, no hepatomegaly Ext: no C/C/E, left forearm AV fistula noted Skin: warm/dry no rash  EKG:  Atrial flutter with variable AV block, heart rate 88 beats per minute, nonspecific T wave abnormality.  2D Echo 03/31/2013: Study Conclusions  - Left ventricle: The cavity size was normal. Wall thickness was increased in a pattern of moderate LVH. Systolic function was normal. The estimated ejection fraction was in the range of 50% to 55%. Wall motion was normal; there were no regional wall motion abnormalities. - Mitral valve: A bioprosthesis was present and functioning normally. Mild regurgitation. - Left atrium: The atrium was mildly to moderately dilated. - Right ventricle: The cavity size was mildly dilated. - Right atrium: The atrium was mildly dilated. - Tricuspid valve: Prior procedures included surgical repair. - Pulmonary arteries: PA peak pressure: 46mm Hg (S).  ASSESSMENT AND PLAN: 1. Chronic mixed systolic and diastolic heart failure. Patient is status post redo cardiac surgery as detailed above. He continues to have New York Heart Association class 2-3 symptoms. This is likely multifactorial (malignant hypertension, medical noncompliance with cocaine use, valvular heart disease, chronic kidney disease). I think his medical program as appropriate. I would like to repeat an echo before his followup visit in 3 months. I had a lengthy discussion with him about the need to abstain from cocaine.  2. Malignant hypertension. Blood pressure is controlled on his current medical program. He is having some elevated readings at home and will bring in his home cuff when he returns so we can calibrate with a manual blood pressure.  3. Valvular heart disease (tricuspid and mitral). Repeat echo before next office visit.  4. Chronic kidney disease. Recent labs reviewed and his creatinine is stable. He follows with Dr. Arrie Aran.   5. Atrial flutter. Heart rate  control is reasonable on carvedilol. Would continue the same. I do not think he is a candidate for anticoagulation because of very poor history of medical noncompliance.  For follow-up I will see him back in 3 months.  Tonny Bollman 01/26/2014 9:58 AM

## 2014-01-26 NOTE — Patient Instructions (Signed)
Your physician has requested that you have an echocardiogram IN 3 MONTHS . Echocardiography is a painless test that uses sound waves to create images of your heart. It provides your doctor with information about the size and shape of your heart and how well your heart's chambers and valves are working. This procedure takes approximately one hour. There are no restrictions for this procedure.  Your physician recommends that you schedule a follow-up appointment in: IN 3 MONTHS//A FEW DAYS AFTER ECHO PLEASE  Your physician recommends that you continue on your current medications as directed. Please refer to the Current Medication list given to you today.

## 2014-02-04 IMAGING — CR DG CHEST 2V
2 series · 2 of 2 positions shown · non-contrast
Comparison: 08/02/2012

CLINICAL DATA: Preop for mitral valve repair.

CHEST - 2 VIEW

[view not recorded (1 of 2)]
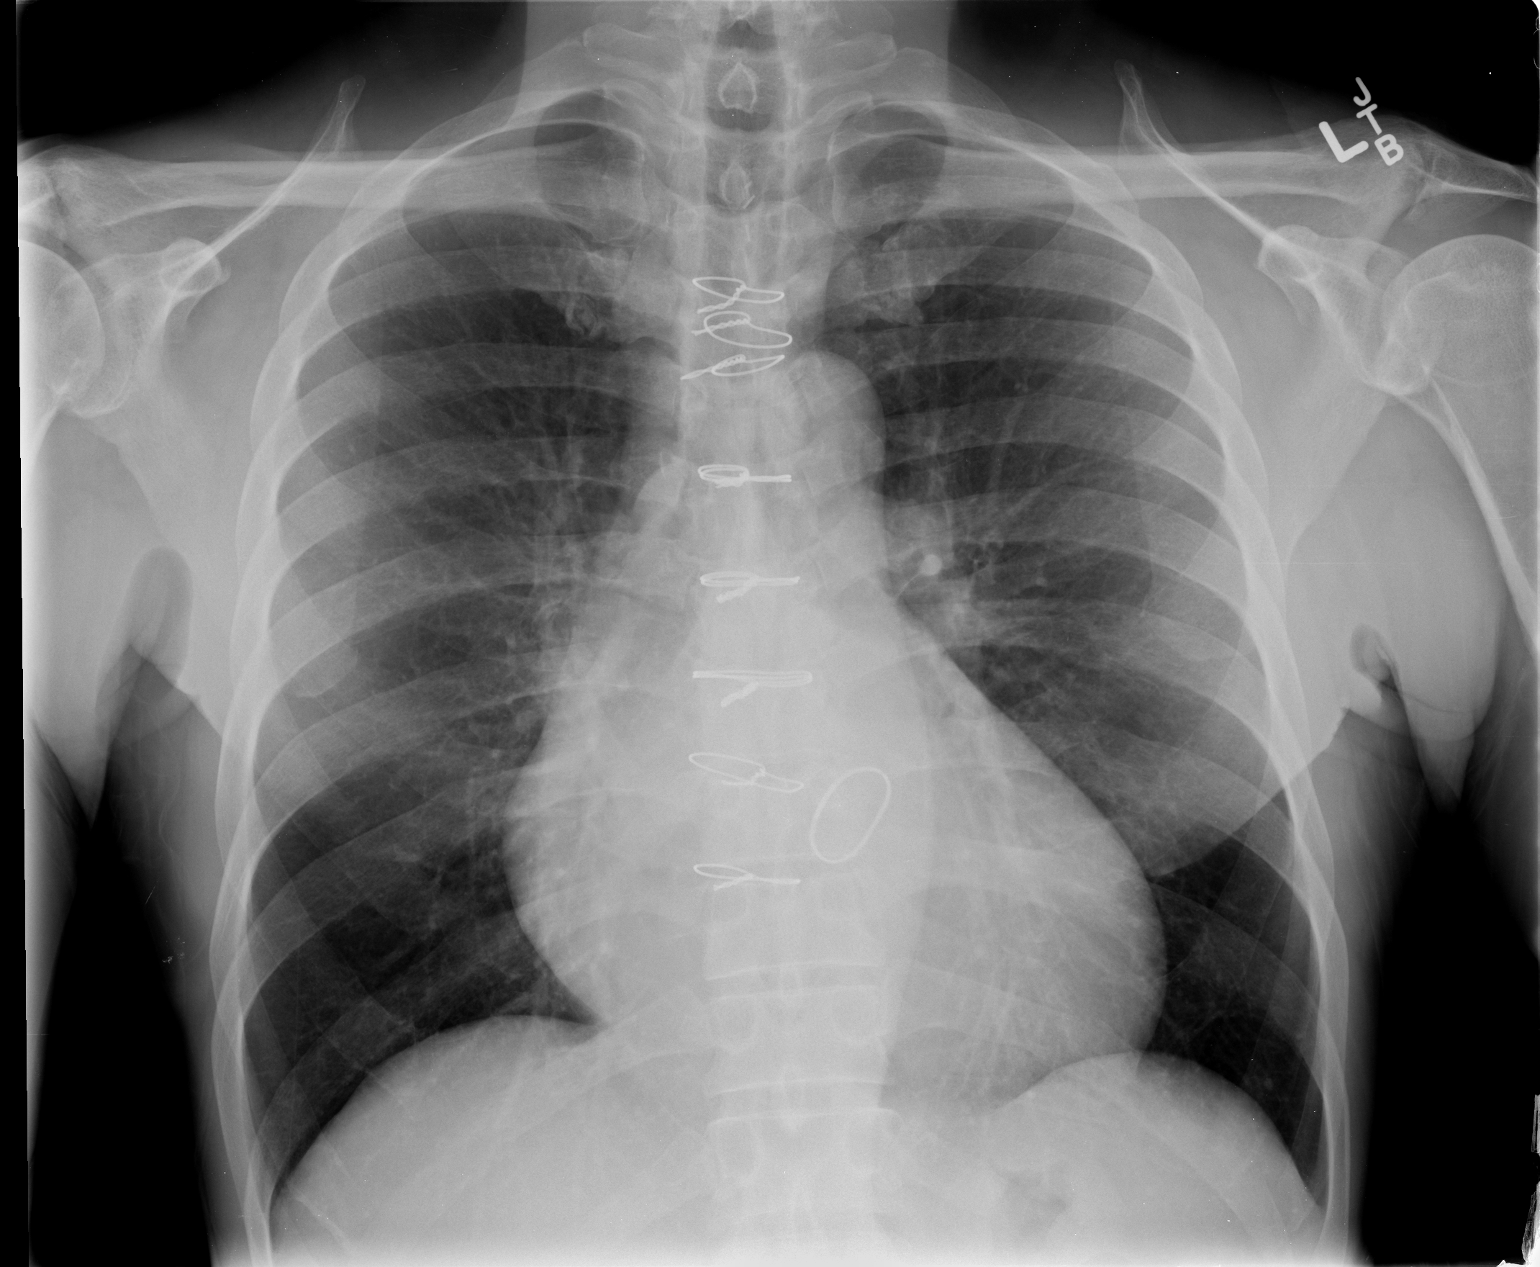

[view not recorded (2 of 2)]
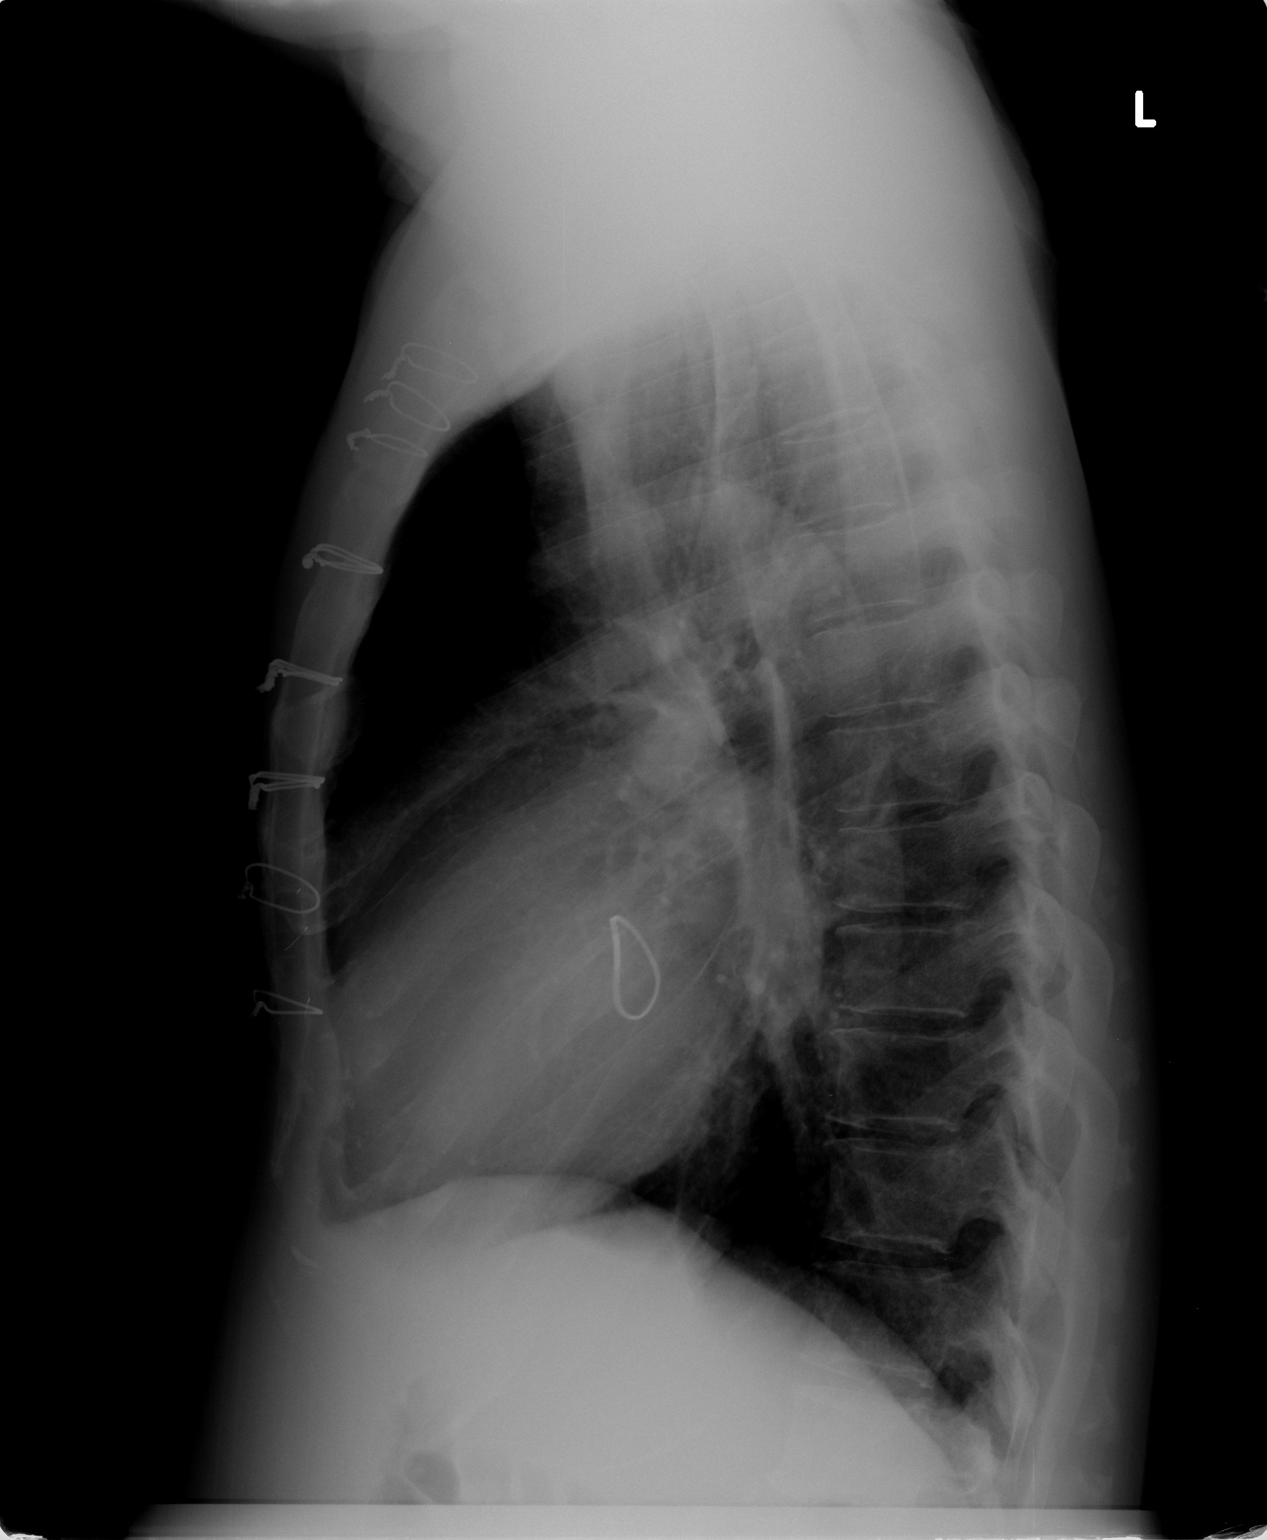

[2 of 2 positions shown; findings below may reference images not displayed]

FINDINGS: Two views of the chest demonstrate mild enlargement of
the heart which is unchanged.  Previous median sternotomy and
evidence of a mitral annuloplasty.  The lungs are clear without
edema or airspace disease.  Trachea is midline.
IMPRESSION: Stable chest radiograph findings.  No acute cardiopulmonary
disease.

## 2014-02-08 ENCOUNTER — Emergency Department (HOSPITAL_COMMUNITY): Payer: Medicaid Other

## 2014-02-08 ENCOUNTER — Inpatient Hospital Stay (HOSPITAL_COMMUNITY)
Admission: EM | Admit: 2014-02-08 | Discharge: 2014-02-17 | DRG: 208 | Disposition: E | Payer: Medicaid Other | Attending: Pulmonary Disease | Admitting: Pulmonary Disease

## 2014-02-08 DIAGNOSIS — R7309 Other abnormal glucose: Secondary | ICD-10-CM | POA: Diagnosis present

## 2014-02-08 DIAGNOSIS — R570 Cardiogenic shock: Secondary | ICD-10-CM | POA: Diagnosis present

## 2014-02-08 DIAGNOSIS — I471 Supraventricular tachycardia: Secondary | ICD-10-CM

## 2014-02-08 DIAGNOSIS — Z91018 Allergy to other foods: Secondary | ICD-10-CM

## 2014-02-08 DIAGNOSIS — Z8249 Family history of ischemic heart disease and other diseases of the circulatory system: Secondary | ICD-10-CM

## 2014-02-08 DIAGNOSIS — I129 Hypertensive chronic kidney disease with stage 1 through stage 4 chronic kidney disease, or unspecified chronic kidney disease: Secondary | ICD-10-CM | POA: Diagnosis present

## 2014-02-08 DIAGNOSIS — F209 Schizophrenia, unspecified: Secondary | ICD-10-CM | POA: Diagnosis present

## 2014-02-08 DIAGNOSIS — D72829 Elevated white blood cell count, unspecified: Secondary | ICD-10-CM | POA: Diagnosis not present

## 2014-02-08 DIAGNOSIS — I469 Cardiac arrest, cause unspecified: Secondary | ICD-10-CM | POA: Diagnosis present

## 2014-02-08 DIAGNOSIS — I4892 Unspecified atrial flutter: Secondary | ICD-10-CM | POA: Diagnosis present

## 2014-02-08 DIAGNOSIS — I5043 Acute on chronic combined systolic (congestive) and diastolic (congestive) heart failure: Secondary | ICD-10-CM | POA: Diagnosis present

## 2014-02-08 DIAGNOSIS — D631 Anemia in chronic kidney disease: Secondary | ICD-10-CM

## 2014-02-08 DIAGNOSIS — R001 Bradycardia, unspecified: Secondary | ICD-10-CM

## 2014-02-08 DIAGNOSIS — N184 Chronic kidney disease, stage 4 (severe): Secondary | ICD-10-CM | POA: Diagnosis present

## 2014-02-08 DIAGNOSIS — N039 Chronic nephritic syndrome with unspecified morphologic changes: Secondary | ICD-10-CM

## 2014-02-08 DIAGNOSIS — K219 Gastro-esophageal reflux disease without esophagitis: Secondary | ICD-10-CM | POA: Diagnosis present

## 2014-02-08 DIAGNOSIS — I509 Heart failure, unspecified: Secondary | ICD-10-CM

## 2014-02-08 DIAGNOSIS — I498 Other specified cardiac arrhythmias: Secondary | ICD-10-CM

## 2014-02-08 DIAGNOSIS — E785 Hyperlipidemia, unspecified: Secondary | ICD-10-CM | POA: Diagnosis present

## 2014-02-08 DIAGNOSIS — Z515 Encounter for palliative care: Secondary | ICD-10-CM

## 2014-02-08 DIAGNOSIS — D696 Thrombocytopenia, unspecified: Secondary | ICD-10-CM | POA: Diagnosis present

## 2014-02-08 DIAGNOSIS — E875 Hyperkalemia: Secondary | ICD-10-CM | POA: Diagnosis present

## 2014-02-08 DIAGNOSIS — J9601 Acute respiratory failure with hypoxia: Secondary | ICD-10-CM

## 2014-02-08 DIAGNOSIS — J9600 Acute respiratory failure, unspecified whether with hypoxia or hypercapnia: Principal | ICD-10-CM

## 2014-02-08 DIAGNOSIS — F319 Bipolar disorder, unspecified: Secondary | ICD-10-CM | POA: Diagnosis present

## 2014-02-08 DIAGNOSIS — Z954 Presence of other heart-valve replacement: Secondary | ICD-10-CM

## 2014-02-08 DIAGNOSIS — F1410 Cocaine abuse, uncomplicated: Secondary | ICD-10-CM | POA: Diagnosis present

## 2014-02-08 DIAGNOSIS — E872 Acidosis, unspecified: Secondary | ICD-10-CM | POA: Diagnosis present

## 2014-02-08 DIAGNOSIS — R4020 Unspecified coma: Secondary | ICD-10-CM | POA: Diagnosis present

## 2014-02-08 DIAGNOSIS — Z66 Do not resuscitate: Secondary | ICD-10-CM

## 2014-02-08 DIAGNOSIS — G931 Anoxic brain damage, not elsewhere classified: Secondary | ICD-10-CM | POA: Diagnosis present

## 2014-02-08 DIAGNOSIS — K7200 Acute and subacute hepatic failure without coma: Secondary | ICD-10-CM | POA: Diagnosis not present

## 2014-02-08 DIAGNOSIS — G40401 Other generalized epilepsy and epileptic syndromes, not intractable, with status epilepticus: Secondary | ICD-10-CM | POA: Diagnosis not present

## 2014-02-08 DIAGNOSIS — F411 Generalized anxiety disorder: Secondary | ICD-10-CM | POA: Diagnosis present

## 2014-02-08 DIAGNOSIS — Z87891 Personal history of nicotine dependence: Secondary | ICD-10-CM

## 2014-02-08 DIAGNOSIS — Z7982 Long term (current) use of aspirin: Secondary | ICD-10-CM

## 2014-02-08 DIAGNOSIS — G609 Hereditary and idiopathic neuropathy, unspecified: Secondary | ICD-10-CM | POA: Diagnosis present

## 2014-02-08 LAB — URINE MICROSCOPIC-ADD ON

## 2014-02-08 LAB — URINALYSIS, ROUTINE W REFLEX MICROSCOPIC
BILIRUBIN URINE: NEGATIVE
GLUCOSE, UA: NEGATIVE mg/dL
Hgb urine dipstick: NEGATIVE
KETONES UR: NEGATIVE mg/dL
Leukocytes, UA: NEGATIVE
Nitrite: NEGATIVE
PH: 6 (ref 5.0–8.0)
Protein, ur: >300 mg/dL — AB
SPECIFIC GRAVITY, URINE: 1.026 (ref 1.005–1.030)
Urobilinogen, UA: 1 mg/dL (ref 0.0–1.0)

## 2014-02-08 LAB — COMPREHENSIVE METABOLIC PANEL
ALK PHOS: 159 U/L — AB (ref 39–117)
ALT: 20 U/L (ref 0–53)
AST: 39 U/L — AB (ref 0–37)
Albumin: 3.1 g/dL — ABNORMAL LOW (ref 3.5–5.2)
BUN: 36 mg/dL — ABNORMAL HIGH (ref 6–23)
CHLORIDE: 103 meq/L (ref 96–112)
CO2: 15 meq/L — AB (ref 19–32)
CREATININE: 3.12 mg/dL — AB (ref 0.50–1.35)
Calcium: 11 mg/dL — ABNORMAL HIGH (ref 8.4–10.5)
GFR calc Af Amer: 25 mL/min — ABNORMAL LOW (ref >90)
GFR, EST NON AFRICAN AMERICAN: 21 mL/min — AB (ref >90)
Glucose, Bld: 172 mg/dL — ABNORMAL HIGH (ref 70–99)
POTASSIUM: 5 meq/L (ref 3.7–5.3)
SODIUM: 141 meq/L (ref 137–147)
Total Bilirubin: 0.7 mg/dL (ref 0.3–1.2)
Total Protein: 6.6 g/dL (ref 6.0–8.3)

## 2014-02-08 LAB — CBC WITH DIFFERENTIAL/PLATELET
Basophils Absolute: 0.1 10*3/uL (ref 0.0–0.1)
Basophils Relative: 1 % (ref 0–1)
EOS PCT: 3 % (ref 0–5)
Eosinophils Absolute: 0.4 10*3/uL (ref 0.0–0.7)
HCT: 44.4 % (ref 39.0–52.0)
Hemoglobin: 14.1 g/dL (ref 13.0–17.0)
Lymphocytes Relative: 53 % — ABNORMAL HIGH (ref 12–46)
Lymphs Abs: 8.3 10*3/uL — ABNORMAL HIGH (ref 0.7–4.0)
MCH: 28 pg (ref 26.0–34.0)
MCHC: 31.8 g/dL (ref 30.0–36.0)
MCV: 88.3 fL (ref 78.0–100.0)
MONOS PCT: 5 % (ref 3–12)
Monocytes Absolute: 0.8 10*3/uL (ref 0.1–1.0)
Neutro Abs: 6 10*3/uL (ref 1.7–7.7)
Neutrophils Relative %: 38 % — ABNORMAL LOW (ref 43–77)
Platelets: 118 10*3/uL — ABNORMAL LOW (ref 150–400)
RBC: 5.03 MIL/uL (ref 4.22–5.81)
RDW: 17.7 % — ABNORMAL HIGH (ref 11.5–15.5)
WBC: 14.7 10*3/uL — AB (ref 4.0–10.5)

## 2014-02-08 LAB — RAPID URINE DRUG SCREEN, HOSP PERFORMED
AMPHETAMINES: NOT DETECTED
Barbiturates: NOT DETECTED
Benzodiazepines: NOT DETECTED
COCAINE: POSITIVE — AB
Opiates: NOT DETECTED
Tetrahydrocannabinol: NOT DETECTED

## 2014-02-08 LAB — I-STAT CHEM 8, ED
BUN: 53 mg/dL — AB (ref 6–23)
CREATININE: 3.2 mg/dL — AB (ref 0.50–1.35)
Calcium, Ion: 1.51 mmol/L — ABNORMAL HIGH (ref 1.12–1.23)
Chloride: 110 mEq/L (ref 96–112)
GLUCOSE: 164 mg/dL — AB (ref 70–99)
HCT: 46 % (ref 39.0–52.0)
Hemoglobin: 15.6 g/dL (ref 13.0–17.0)
Potassium: 4.7 mEq/L (ref 3.7–5.3)
SODIUM: 139 meq/L (ref 137–147)
TCO2: 18 mmol/L (ref 0–100)

## 2014-02-08 LAB — PROTIME-INR
INR: 1.49 (ref 0.00–1.49)
PROTHROMBIN TIME: 17.6 s — AB (ref 11.6–15.2)

## 2014-02-08 LAB — I-STAT TROPONIN, ED: Troponin i, poc: 0.07 ng/mL (ref 0.00–0.08)

## 2014-02-08 LAB — POCT I-STAT, CHEM 8
BUN: 45 mg/dL — AB (ref 6–23)
Calcium, Ion: 1.34 mmol/L — ABNORMAL HIGH (ref 1.12–1.23)
Chloride: 107 mEq/L (ref 96–112)
Creatinine, Ser: 3.6 mg/dL — ABNORMAL HIGH (ref 0.50–1.35)
Glucose, Bld: 141 mg/dL — ABNORMAL HIGH (ref 70–99)
HCT: 48 % (ref 39.0–52.0)
Hemoglobin: 16.3 g/dL (ref 13.0–17.0)
POTASSIUM: 6.2 meq/L — AB (ref 3.7–5.3)
SODIUM: 141 meq/L (ref 137–147)
TCO2: 23 mmol/L (ref 0–100)

## 2014-02-08 LAB — I-STAT ARTERIAL BLOOD GAS, ED
Acid-base deficit: 12 mmol/L — ABNORMAL HIGH (ref 0.0–2.0)
Bicarbonate: 19.1 mEq/L — ABNORMAL LOW (ref 20.0–24.0)
O2 SAT: 98 %
PH ART: 7.115 — AB (ref 7.350–7.450)
PO2 ART: 142 mmHg — AB (ref 80.0–100.0)
Patient temperature: 33.9
TCO2: 21 mmol/L (ref 0–100)
pCO2 arterial: 56.7 mmHg — ABNORMAL HIGH (ref 35.0–45.0)

## 2014-02-08 LAB — APTT: APTT: 85 s — AB (ref 24–37)

## 2014-02-08 LAB — GLUCOSE, CAPILLARY: GLUCOSE-CAPILLARY: 155 mg/dL — AB (ref 70–99)

## 2014-02-08 LAB — I-STAT CG4 LACTIC ACID, ED: Lactic Acid, Venous: 11.07 mmol/L — ABNORMAL HIGH (ref 0.5–2.2)

## 2014-02-08 MED ORDER — SODIUM CHLORIDE 0.9 % IV SOLN: INTRAVENOUS | Status: DC

## 2014-02-08 MED ORDER — SODIUM CHLORIDE 0.9 % IV SOLN
1.0000 ug/kg/min | INTRAVENOUS | Status: DC
  Filled 2014-02-08: qty 20

## 2014-02-08 MED ORDER — SODIUM BICARBONATE 8.4 % IV SOLN
INTRAVENOUS | Status: DC | PRN
  Administered 2014-02-08: 100 meq via INTRAVENOUS

## 2014-02-08 MED ORDER — FENTANYL CITRATE 0.05 MG/ML IJ SOLN: 100.0000 ug | Freq: Once | INTRAMUSCULAR | Status: AC | PRN

## 2014-02-08 MED ORDER — INSULIN ASPART 100 UNIT/ML ~~LOC~~ SOLN
10.0000 [IU] | Freq: Once | SUBCUTANEOUS | Status: AC
  Administered 2014-02-09: 10 [IU] via SUBCUTANEOUS

## 2014-02-08 MED ORDER — NOREPINEPHRINE BITARTRATE 1 MG/ML IJ SOLN
2.0000 ug/min | INTRAVENOUS | Status: DC
  Filled 2014-02-08: qty 4

## 2014-02-08 MED ORDER — CISATRACURIUM BOLUS VIA INFUSION
0.0500 mg/kg | INTRAVENOUS | Status: AC | PRN
  Administered 2014-02-10: 3.8 mg via INTRAVENOUS
  Filled 2014-02-08: qty 4

## 2014-02-08 MED ORDER — INSULIN ASPART 100 UNIT/ML ~~LOC~~ SOLN
0.0000 [IU] | SUBCUTANEOUS | Status: DC
  Administered 2014-02-08 – 2014-02-09 (×2): 2 [IU] via SUBCUTANEOUS

## 2014-02-08 MED ORDER — EPINEPHRINE HCL 0.1 MG/ML IJ SOSY
PREFILLED_SYRINGE | INTRAMUSCULAR | Status: AC | PRN
  Administered 2014-02-08 (×5): 1 mg via INTRAVENOUS

## 2014-02-08 MED ORDER — ROCURONIUM BROMIDE 50 MG/5ML IV SOLN
INTRAVENOUS | Status: AC | PRN
  Administered 2014-02-08: 70 mg via INTRAVENOUS

## 2014-02-08 MED ORDER — SODIUM BICARBONATE 8.4 % IV SOLN
INTRAVENOUS | Status: AC | PRN
  Administered 2014-02-08 (×2): 50 meq via INTRAVENOUS

## 2014-02-08 MED ORDER — AMIODARONE HCL IN DEXTROSE 360-4.14 MG/200ML-% IV SOLN: 30.0000 mg/h | INTRAVENOUS | Status: DC

## 2014-02-08 MED ORDER — DEXTROSE 50 % IV SOLN
50.0000 mL | Freq: Once | INTRAVENOUS | Status: AC
  Administered 2014-02-09: 50 mL via INTRAVENOUS
  Filled 2014-02-08: qty 50

## 2014-02-08 MED ORDER — SODIUM CHLORIDE 0.9 % IV SOLN: INTRAVENOUS | Status: DC | PRN

## 2014-02-08 MED ORDER — MORPHINE SULFATE 4 MG/ML IJ SOLN: 4.0000 mg | Freq: Once | INTRAMUSCULAR | Status: DC

## 2014-02-08 MED ORDER — ETOMIDATE 2 MG/ML IV SOLN
INTRAVENOUS | Status: AC | PRN
  Administered 2014-02-08: 15 mg via INTRAVENOUS

## 2014-02-08 MED ORDER — SODIUM CHLORIDE 0.9 % IV SOLN
25.0000 ug/h | INTRAVENOUS | Status: DC
  Administered 2014-02-09: 150 ug/h via INTRAVENOUS
  Administered 2014-02-10: 250 ug/h via INTRAVENOUS
  Filled 2014-02-08 (×3): qty 50

## 2014-02-08 MED ORDER — PANTOPRAZOLE SODIUM 40 MG IV SOLR
40.0000 mg | Freq: Every day | INTRAVENOUS | Status: DC
  Administered 2014-02-08 – 2014-02-10 (×3): 40 mg via INTRAVENOUS
  Filled 2014-02-08 (×6): qty 40

## 2014-02-08 MED ORDER — NOREPINEPHRINE BITARTRATE 1 MG/ML IJ SOLN
INTRAMUSCULAR | Status: AC | PRN
  Administered 2014-02-08: 5 ug/kg/min via INTRAVENOUS

## 2014-02-08 MED ORDER — DOPAMINE-DEXTROSE 3.2-5 MG/ML-% IV SOLN
INTRAVENOUS | Status: AC | PRN
  Administered 2014-02-08: 10 ug/kg/min via INTRAVENOUS

## 2014-02-08 MED ORDER — CALCIUM CHLORIDE 10 % IV SOLN
INTRAVENOUS | Status: AC | PRN
  Administered 2014-02-08 (×2): 1 g via INTRAVENOUS

## 2014-02-08 MED ORDER — AMIODARONE HCL IN DEXTROSE 360-4.14 MG/200ML-% IV SOLN
60.0000 mg/h | INTRAVENOUS | Status: DC
  Filled 2014-02-08: qty 200

## 2014-02-08 MED ORDER — HEPARIN BOLUS VIA INFUSION
2000.0000 [IU] | Freq: Once | INTRAVENOUS | Status: AC
  Administered 2014-02-08: 2000 [IU] via INTRAVENOUS
  Filled 2014-02-08: qty 2000

## 2014-02-08 MED ORDER — MIDAZOLAM HCL 5 MG/ML IJ SOLN: 2.0000 mg | Freq: Once | INTRAMUSCULAR | Status: DC | PRN

## 2014-02-08 MED ORDER — ASPIRIN 300 MG RE SUPP
300.0000 mg | Freq: Once | RECTAL | Status: AC
  Administered 2014-02-08: 300 mg via RECTAL
  Filled 2014-02-08: qty 1

## 2014-02-08 MED ORDER — SODIUM CHLORIDE 0.9 % IV SOLN
250.0000 mL | INTRAVENOUS | Status: DC | PRN
  Administered 2014-02-09: 250 mL via INTRAVENOUS
  Administered 2014-02-10: via INTRAVENOUS

## 2014-02-08 MED ORDER — DOPAMINE-DEXTROSE 3.2-5 MG/ML-% IV SOLN: 2.0000 ug/kg/min | INTRAVENOUS | Status: DC

## 2014-02-08 MED ORDER — SODIUM CHLORIDE 0.9 % IV SOLN
10.0000 ug/h | INTRAVENOUS | Status: DC
  Administered 2014-02-08: 10 ug/h via INTRAVENOUS
  Filled 2014-02-08: qty 50

## 2014-02-08 MED ORDER — FENTANYL CITRATE 0.05 MG/ML IJ SOLN: 100.0000 ug | Freq: Once | INTRAMUSCULAR | Status: DC

## 2014-02-08 MED ORDER — MIDAZOLAM HCL 5 MG/ML IJ SOLN: 2.0000 mg | Freq: Once | INTRAMUSCULAR | Status: DC

## 2014-02-08 MED ORDER — HEPARIN (PORCINE) IN NACL 100-0.45 UNIT/ML-% IJ SOLN
750.0000 [IU]/h | INTRAMUSCULAR | Status: DC
  Filled 2014-02-08 (×2): qty 250

## 2014-02-08 MED ORDER — SODIUM CHLORIDE 0.9 % IV SOLN
0.5000 ug/kg/min | INTRAVENOUS | Status: DC
  Administered 2014-02-08: 0.5 ug/kg/min via INTRAVENOUS
  Filled 2014-02-08: qty 20

## 2014-02-08 MED ORDER — FENTANYL BOLUS VIA INFUSION
50.0000 ug | INTRAVENOUS | Status: DC | PRN
  Administered 2014-02-09 – 2014-02-10 (×3): 50 ug via INTRAVENOUS
  Filled 2014-02-08: qty 50

## 2014-02-08 MED ORDER — SODIUM CHLORIDE 0.9 % IV SOLN
1.0000 g | Freq: Once | INTRAVENOUS | Status: AC
  Administered 2014-02-09: 1 g via INTRAVENOUS
  Filled 2014-02-08: qty 10

## 2014-02-08 MED ORDER — CISATRACURIUM BOLUS VIA INFUSION
0.1000 mg/kg | Freq: Once | INTRAVENOUS | Status: DC
  Filled 2014-02-08: qty 8

## 2014-02-08 MED ORDER — ARTIFICIAL TEARS OP OINT
1.0000 "application " | TOPICAL_OINTMENT | Freq: Three times a day (TID) | OPHTHALMIC | Status: DC
  Administered 2014-02-08 – 2014-02-11 (×9): 1 via OPHTHALMIC
  Filled 2014-02-08 (×2): qty 3.5

## 2014-02-08 MED ORDER — LORAZEPAM 2 MG/ML IJ SOLN: 4.0000 mg | Freq: Once | INTRAMUSCULAR | Status: DC

## 2014-02-08 MED ORDER — ASPIRIN 81 MG PO CHEW: 324.0000 mg | CHEWABLE_TABLET | ORAL | Status: DC

## 2014-02-08 MED ORDER — SODIUM CHLORIDE 0.9 % IV SOLN
1.0000 mg/h | INTRAVENOUS | Status: DC
  Administered 2014-02-08: 1 mg/h via INTRAVENOUS
  Filled 2014-02-08: qty 10

## 2014-02-08 MED ORDER — ASPIRIN 300 MG RE SUPP: 300.0000 mg | RECTAL | Status: DC

## 2014-02-08 MED ORDER — ATROPINE SULFATE 1 MG/ML IJ SOLN
INTRAMUSCULAR | Status: AC | PRN
  Administered 2014-02-08: 1 mg via INTRAVENOUS

## 2014-02-08 MED ORDER — SODIUM CHLORIDE 0.9 % IV SOLN
1.0000 mg/h | INTRAVENOUS | Status: DC
  Administered 2014-02-09 – 2014-02-10 (×2): 2 mg/h via INTRAVENOUS
  Administered 2014-02-10: 8 mg/h via INTRAVENOUS
  Administered 2014-02-10 (×2): 5 mg/h via INTRAVENOUS
  Filled 2014-02-08 (×3): qty 10

## 2014-02-08 MED ORDER — SODIUM BICARBONATE 8.4 % IV SOLN
INTRAVENOUS | Status: AC
  Filled 2014-02-08: qty 50

## 2014-02-08 MED ORDER — MIDAZOLAM BOLUS VIA INFUSION
2.0000 mg | INTRAVENOUS | Status: DC | PRN
  Administered 2014-02-09 – 2014-02-10 (×2): 2 mg via INTRAVENOUS
  Administered 2014-02-10: 1 mg via INTRAVENOUS
  Administered 2014-02-10: 2 mg via INTRAVENOUS
  Filled 2014-02-08: qty 2

## 2014-02-08 NOTE — Code Documentation (Signed)
Levophed turned off per MD

## 2014-02-08 NOTE — Progress Notes (Signed)
ANTICOAGULATION CONSULT NOTE - Initial Consult  Pharmacy Consult for Heparin  Indication: ACS on Arctic Sun Protocol   Allergies  Allergen Reactions  . Cashew Nut Oil Anaphylaxis  . Food Anaphylaxis    Strawberries, Cashews, causes throat to swell - affects breathing  . Strawberry Anaphylaxis    Patient Measurements: Weight: 168 lb (76.204 kg) Heparin Dosing Weight: n/a  Vital Signs: BP: 178/95 mmHg (03/23 2037) Pulse Rate: 67 (03/23 2037)  Labs:  Recent Labs  Nov 28, 2013 1848 Nov 28, 2013 1906  HGB 14.1 15.6  HCT 44.4 46.0  PLT 118*  --   CREATININE 3.12* 3.20*    The CrCl is unknown because both a height and weight (above a minimum accepted value) are required for this calculation.   Medical History: Past Medical History  Diagnosis Date  . Hypertension   . Bipolar disorder   . Schizophrenia     [facility]  . Valvular heart disease     a. 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 b. 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  Marland Kitchen. Hyperlipidemia   . H/O: suicide attempt 1998    [facility]  . Anemia     Hx of iron infusions  . Gastroparesis   . Arthritis   . Cocaine abuse     per pt none x 3 yrs however continues to "handle" it and deal it with + UDS.  Marland Kitchen. Anxiety   . CHF (congestive heart failure)     a. EF 25-30% by echo 12/02/12, but 55% by TEE 12/04/12.  Marland Kitchen. GERD (gastroesophageal reflux disease)   . Depression   . Vertigo   . Itchy skin   . Peripheral neuropathy     BLE  . Bilateral lower abdominal cramping   . Shortness of breath     "periodically; happens at any time" (03/30/13)  . Generalized headaches     "monthly" (03/30/2013)  . Chronic kidney disease     has fistula, not on HD; "go to [facility] days to have kidneys checked out" (03/30/2013)  . Atrial tachycardia     a. dx 11/2012: tx with amiodarone, cardioversion (discharge rhythm: likely junctional).     Medications:   (Not in a hospital admission)  Assessment: [age] YOM who presents to the ED after a witnessed cardiac arrest started on Artctic sun protocol. Pharmacy to start heparin therapy for ACS. Will start with low bolus and drip rate. Hgb wnl; Plt 118.   Goal of Therapy:  0.5-0.7 Monitor platelets by anticoagulation protocol: Yes   Plan:  Give 2000 units bolus x 1 Start heparin infusion at 600 units/hr Check anti-Xa level in 6 hours and daily while on heparin Continue to monitor H&H and platelets Be prepared for large rate increases when patient is rewarmed   Vinnie LevelBenjamin Lova Urbieta, PharmD.  Clinical Pharmacist Pager [phone]

## 2014-02-08 NOTE — Code Documentation (Signed)
Set up for central line 

## 2014-02-08 NOTE — Progress Notes (Signed)
Pt arrived intubated with king airway and reintubated by resident with 8.0 ETT using glidescope. Pt placed on vent for less than 5 minutes and CPR restarted, RT continued to bag patient. Left radial Aline was placed by Genia DelGena Turner, RRT with one attempt and good waveform.

## 2014-02-08 NOTE — Procedures (Signed)
Arterial Catheter Insertion Procedure Note Ryan Winters Person [id number] 25-Aug-1961  Procedure: Insertion of Arterial Catheter  Indications: Blood pressure monitoring  Procedure Details Consent: Unable to obtain consent because of emergent medical necessity. Time Out: Verified patient identification, verified procedure, site/side was marked, verified correct patient position, special equipment/implants available, medications/allergies/relevent history reviewed, required imaging and test results available.  Performed  Maximum sterile technique was used including Skin prep: Chlorhexidine; local anesthetic administered 20 gauge catheter was inserted into right radial artery using the Seldinger technique.  Evaluation Blood flow good; BP tracing good. Complications: No apparent complications.   Genia Delurner, Marsean Elkhatib Apple 03/14/14

## 2014-02-08 NOTE — Consult Note (Addendum)
CARDIOLOGY CONSULT NOTE  Patient ID: Ryan Winters MRN: [id number] DOB/AGE: Nov 22, 1960 53 y.o.  Admit date: 01/27/2014 Primary Cardiologist: Dr. Excell Seltzer Reason for Consultation: Cardiac arrest  HPI: 53 yo with history of bioprosthetic MV replacement/TV repair, chronic diastolic CHF, malignant HTN, CKD IV, and cocaine abuse had cardiac arrest this evening. Patient was seen in the office by Dr Excell Seltzer on 3/10 and was stable.  Today, he collapsed at home (witnessed) and had > 20 minutes bystander CPR.  EMS responded and he was cardioverted for ventricular fibrillation.  In the ER, he had 20 more minutes of CPR for slow PEA.  He was externally paced for a period of time.  He was placed on dopamine and norepinephrine, now with HR in 60s (?junctional rhythm versus ectopic atrial rhythm) and SBP in the 170s.  He has been intubated and cooled.  He had no gag reflex or pupillary response.  Intially he was evaluated by Dr Myrtis Ser who handed his care over to me.  He did not have evidence for STEMI on ECG.  Dr. Myrtis Ser and Dr. Allyson Sabal decided not to take him to the cath lab emergently.  Urine was positive for cocaine. Limited beside echo done earlier tonight per report showed paradoxical septal motion.   ROS: Unable to obtain  Past Medical History: 1. Malignant HTN 2. CKD Stage IV: Baseline creatinine around 3.2 3. Valvular heart disease: MV repair in 2011.  Developed recurrent severe MR so had bioprosthetic MV replacement with TV repair in 11/13.  Last echo in 5/14 with EF 50-55%, moderate LVH, bioprosthetic MV with mild MR, s/p TV repair, PA systolic pressure 46 mmHg.  4. Chronic diastolic CHF.  5. Cocaine abuse 6. Schizophrenia 7. Bipolar disorder 8. Anemia 9. GERD 10. Atrial flutter: Noted at last office visit.  He is not on anticoagulation due to poor compliance.  11. LHC (11/13) with no significant coronary disease.   Current Facility-Administered Medications  Medication Dose Route Frequency  Provider Last Rate Last Dose  . 0.9 %  sodium chloride infusion  250 mL Intravenous PRN Jeanella Craze, NP      . 0.9 %  sodium chloride infusion   Intravenous Continuous Jeanella Craze, NP      . artificial tears (LACRILUBE) ophthalmic ointment 1 application  1 application Both Eyes 3 times per day Jeanella Craze, NP      . atropine injection   Intravenous PRN Derwood Kaplan, MD   1 mg at 02/15/2014 1837  . calcium chloride injection   Intravenous PRN Derwood Kaplan, MD   1 g at 02/02/2014 1840  . cisatracurium (NIMBEX) 200 mg in sodium chloride 0.9 % 200 mL infusion  0.5-10 mcg/kg/min Intravenous Titrated Ankit Nanavati, MD 2.3 mL/hr at 01/21/2014 2040 0.5 mcg/kg/min at 02/07/2014 2040  . cisatracurium (NIMBEX) bolus via infusion 7.6 mg  0.1 mg/kg Intravenous Once Jeanella Craze, NP       And  . cisatracurium (NIMBEX) 200 mg in sodium chloride 0.9 % 200 mL infusion  1-1.5 mcg/kg/min Intravenous Continuous Jeanella Craze, NP       And  . cisatracurium (NIMBEX) bolus via infusion 3.8 mg  0.05 mg/kg Intravenous PRN Jeanella Craze, NP      . DOPamine (INTROPIN) 800 mg in dextrose 5 % 250 mL infusion   Intravenous Continuous PRN Luis Abed, MD 17.1 mL/hr at 01/30/2014 2037 12 mcg/kg/min at 01/18/2014 2037  . EPINEPHrine (ADRENALIN) 0.1 MG/ML injection   Intravenous PRN  Derwood Kaplan, MD   1 mg at 01/23/2014 1930  . etomidate (AMIDATE) injection   Intravenous PRN Derwood Kaplan, MD   15 mg at 02/15/2014 1850  . fentaNYL (SUBLIMAZE) 10 mcg/mL in sodium chloride 0.9 % 250 mL infusion  10 mcg/hr Intravenous Continuous Derwood Kaplan, MD 1 mL/hr at 02/10/2014 2040 10 mcg/hr at 02/11/2014 2040  . fentaNYL (SUBLIMAZE) 10 mcg/mL in sodium chloride 0.9 % 250 mL infusion  25-300 mcg/hr Intravenous Continuous Jeanella Craze, NP      . fentaNYL (SUBLIMAZE) bolus via infusion 50 mcg  50 mcg Intravenous Q15 min PRN Jeanella Craze, NP      . fentaNYL (SUBLIMAZE) injection 100 mcg  100 mcg Intravenous Once Jeanella Craze, NP        . fentaNYL (SUBLIMAZE) injection 100 mcg  100 mcg Intravenous Once PRN Jeanella Craze, NP      . heparin ADULT infusion 100 units/mL (25000 units/250 mL)  600 Units/hr Intravenous Continuous Ankit Nanavati, MD      . LORazepam (ATIVAN) injection 4 mg  4 mg Intravenous Once Derwood Kaplan, MD      . midazolam (VERSED) 1 mg/mL in sodium chloride 0.9 % 50 mL infusion  1 mg/hr Intravenous Continuous Ankit Nanavati, MD 1 mL/hr at 01/26/2014 2039 1 mg/hr at 01/18/2014 2039  . midazolam (VERSED) 1 mg/mL in sodium chloride 0.9 % 50 mL infusion  1-10 mg/hr Intravenous Continuous Jeanella Craze, NP      . midazolam (VERSED) bolus via infusion 2 mg  2 mg Intravenous Q30 min PRN Jeanella Craze, NP      . midazolam (VERSED) injection 2 mg  2 mg Intravenous Once Jeanella Craze, NP      . midazolam (VERSED) injection 2 mg  2 mg Intravenous Once PRN Jeanella Craze, NP      . morphine 4 MG/ML injection 4 mg  4 mg Intravenous Once Ankit Nanavati, MD      . norepinephrine (LEVOPHED) 4 mg in dextrose 5 % 250 mL infusion  2-50 mcg/min Intravenous Titrated Derwood Kaplan, MD 75 mL/hr at 02/15/2014 1946 20 mcg/min at 01/20/2014 1946  . norepinephrine (LEVOPHED) 4 mg in dextrose 5 % 250 mL infusion  0.5-10 mcg/min Intravenous Titrated Jeanella Craze, NP      . norepinephrine (LEVOPHED) injection   Intravenous Continuous PRN Ankit Nanavati, MD 22.9 mL/hr at 01/26/2014 1851 5 mcg/kg/min at 01/29/2014 1851  . pantoprazole (PROTONIX) injection 40 mg  40 mg Intravenous QHS Jeanella Craze, NP      . rocuronium (ZEMURON) injection   Intravenous PRN Derwood Kaplan, MD   70 mg at 01/22/2014 1850  . sodium bicarbonate 1 mEq/mL injection           . sodium bicarbonate injection   Intravenous PRN Derwood Kaplan, MD   50 mEq at 01/24/2014 1858   Current Outpatient Prescriptions  Medication Sig Dispense Refill  . acetaminophen (TYLENOL) 325 MG tablet Take 650 mg by mouth every 6 (six) hours as needed for pain.      Marland Kitchen albuterol (PROVENTIL  HFA;VENTOLIN HFA) 108 (90 BASE) MCG/ACT inhaler Inhale 2 puffs into the lungs every 6 (six) hours as needed for wheezing.      . ARIPiprazole (ABILIFY) 5 MG tablet Take 5 mg by mouth daily.       Marland Kitchen aspirin EC 81 MG tablet Take 81 mg by mouth daily.      . carvedilol (COREG) 25 MG tablet  Take 0.5 tablets (12.5 mg total) by mouth 2 (two) times daily with a meal.  30 tablet  4  . furosemide (LASIX) 40 MG tablet Take 40 mg by mouth 2 (two) times daily.      . hydrALAZINE (APRESOLINE) 100 MG tablet Take 0.5 tablets (50 mg total) by mouth 2 (two) times daily.  60 tablet  4  . hydrocortisone cream 1 % Apply topically as needed for itching.  30 g  0  . hydrOXYzine (VISTARIL) 50 MG capsule Take 50 mg by mouth at bedtime.      . metoCLOPramide (REGLAN) 10 MG tablet Take 1 tablet (10 mg total) by mouth 4 (four) times daily.  30 tablet  3  . omeprazole (PRILOSEC) 40 MG capsule Take 40 mg by mouth 2 (two) times daily as needed (heartburn).         Family History  Problem Relation Age of Onset  . Hypertension Mother   . Heart disease Mother   . Heart disease Brother     History   Social History  . Marital Status: Married    Spouse Name: N/A    Number of Children: N/A  . Years of Education: N/A   Occupational History  . disabled    Social History Main Topics  . Smoking status: Former Smoker -- 1.50 packs/day for 30 years    Types: Cigarettes    Quit date: 03/21/2011  . Smokeless tobacco: Former NeurosurgeonUser  . Alcohol Use: No     Comment: Quit alcohol in July of 2011  . Drug Use: Yes    Special: Cocaine, Marijuana     Comment: None since November "?2012" when had rectal bleeding and went to hospital  . Sexual Activity: Not Currently   Other Topics Concern  . Not on file   Social History Narrative   09/09/2012   He lives with mother and is separated from wife. Daughter in [place] with 2 grandchildren. He denies any cigs, drugs, alcohol at present.       (Not in a hospital  admission)  Physical exam Blood pressure 178/95, pulse 67, resp. rate 18, weight 168 lb (76.204 kg), SpO2 99.00%. General: intubated/sedated Neck: No JVD, no thyromegaly or thyroid nodule.  Lungs: Dependent crackles. CV: Nondisplaced PMI.  Heart regular S1/S2, no S3/S4, no murmur.  No peripheral edema.  No carotid bruit.   Abdomen: Soft, no hepatosplenomegaly, no distention.  Skin: Intact without lesions or rashes.  Neurologic: Does not respond to painful stimuli, no gag, pupils fixed Psych: Normal affect. Extremities: No clubbing or cyanosis.  HEENT: Normal.   Labs:   Lab Results  Component Value Date   WBC 14.7* 01/23/2014   HGB 15.6 01/30/2014   HCT 46.0 01/28/2014   MCV 88.3 02/14/2014   PLT 118* 01/17/2014    Recent Labs Lab 02/14/2014 1848 02/04/2014 1906  NA 141 139  K 5.0 4.7  CL 103 110  CO2 15*  --   BUN 36* 53*  CREATININE 3.12* 3.20*  CALCIUM 11.0*  --   PROT 6.6  --   BILITOT 0.7  --   ALKPHOS 159*  --   ALT 20  --   AST 39*  --   GLUCOSE 172* 164*  Lactate 11 +cocaine on UDS    Radiology: - CXR: Interstitial edema  EKG: Suspect ectopic atrial rhythm with LVH/repolarization abnormality  ASSESSMENT AND PLAN:  53 yo with history of bioprosthetic MV replacement/TV repair, chronic diastolic CHF, malignant HTN, CKD IV,  and cocaine abuse had cardiac arrest this evening.  He was noted initially to have ventricular fibrillation and was shocked, later to have slow PEA and was externally paced.  1. Cardiac arrest: Initially ventricular fibrillation, later slow PEA requiring external pacing.  ? Initial cause: could be cocaine-related (urine cocaine+).  He did not have significant CAD on 11/13 cath.  He is now on dopamine with reasonable heart rate.  He has been intubated and cooled.  No evidence for STEMI.  I think it is reasonable not to take him directly to the cath lab: prolonged CPR (total probably around 40 minutes), lactate 11.  I am not optimistic for neurological  recovery due to lack of withdrawal to pain, lack of gag, lack of pupillary response.  - Echocardiogram - Hypothermia protocol and supportive care. - Wean pressors as able, would start with norepinephrine given bradycardia.  - LHC if he has neurologic recovery.  2. Valvular heart disease: Will get echo as above.  3. H/o atrial flutter: Current rhythm appears to be an ectopic atrial rhythm.  Will continue to follow.  4. CKD: Stage IV.  Currently, creatinine is at his baseline.   Marca Ancona 02/09/2014

## 2014-02-08 NOTE — Code Documentation (Signed)
Artic Sun cooling started

## 2014-02-08 NOTE — Code Documentation (Signed)
Cooling pads applied 

## 2014-02-08 NOTE — Procedures (Signed)
Central Venous Catheter Insertion Procedure Note Lowell Boutonntonio Cowdrey [id number] [birth year]  Procedure: Insertion of Central Venous Catheter Indications: Assessment of intravascular volume, Drug and/or fluid administration and Frequent blood sampling  Procedure Details Consent: Unable to obtain consent because of emergent medical necessity. Time Out: Verified patient identification, verified procedure, site/side was marked, verified correct patient position, special equipment/implants available, medications/allergies/relevent history reviewed, required imaging and test results available.  Performed  Maximum sterile technique was used including antiseptics, cap, gloves, gown, hand hygiene, mask and sheet. Skin prep: Chlorhexidine; local anesthetic administered A antimicrobial bonded/coated triple lumen catheter was placed in the left internal jugular vein to 18 cm using the Seldinger technique.  Evaluation Blood flow good Complications: No apparent complications Patient did tolerate procedure well. Chest X-ray ordered to verify placement.  CXR: pending.  Procedure performed under direct supervision of Dr. Kendrick FriesMcQuaid and with ultrasound guidance for real time vessel cannulation.     Canary BrimBrandi Ollis, NP-C [facility] Pgr: [phone] or [phone]    02/05/2014, 8:32 PM  Attending:  Max FickleMCQUAID, Tala Eber [place] [facility] Pager: [phone] Cell: [phone] If no response, call [phone]

## 2014-02-08 NOTE — Code Documentation (Signed)
Brandi NP with critical care at bedside

## 2014-02-08 NOTE — Code Documentation (Addendum)
Levophed restarted at 255mcg/min.

## 2014-02-08 NOTE — ED Provider Notes (Signed)
CSN: [id number]     Arrival date & time 01/22/2014  1828 History   First MD Initiated Contact with Patient 02/11/2014 1846     Patient presented in cardiac arrest  HPI Esco Joslyn is a 53 y.o. male with history of CKD, CHF, valvular heart disease with history of tricuspid and mitral valve replacements, history of cocaine abuse, who presented after cardiac arrest.  This was witnessed by his brother about 30 minutes prior to arrival.  When EMS arrived, patient was pulseless, in asystole.  They performed CPR for a total of about 20 minutes prior to arrival.  They obtained ROSC X 2.  They gave epi.  At one point, patient went into a shockable rhythm and they defibrillated him.  He regained pulses minutes prior to arrival at the ED, and had a bradycardic rhythm in the 30s-40s, and they initiated cardiac pacing.  All history obtained from EMS.  Upon initial pulse check when patient was bedded in ED, patient pulseless, in PEA rhythm.    Past Medical History  Diagnosis Date  . Hypertension   . Bipolar disorder   . Schizophrenia     h/o hospitalized inpt therapy @ the [facility]  . Valvular heart disease     a. 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 b. 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  Marland Kitchen Hyperlipidemia   . H/O: suicide attempt 1998    was hospitalized at [facility]  . Anemia     Hx of iron infusions  . Gastroparesis   . Arthritis   . Cocaine abuse     per pt none x 3 yrs however continues to "handle" it and deal it with + UDS.  Marland Kitchen Anxiety   . CHF (congestive heart failure)     a. EF 25-30% by echo 12/02/12, but 55% by TEE 12/04/12.  Marland Kitchen GERD (gastroesophageal reflux disease)   . Depression   . Vertigo   . Itchy skin   . Peripheral neuropathy     BLE  . Bilateral lower abdominal cramping   . Shortness of breath     "periodically; happens at any time" (03/30/13)  . Generalized headaches     "monthly" (03/30/2013)  . Chronic kidney disease     has  fistula, not on HD; "go to [facility] q 90 days to have kidneys checked out" (03/30/2013)  . Atrial tachycardia     a. dx 11/2012: tx with amiodarone, cardioversion (discharge rhythm: likely junctional).   Past Surgical History  Procedure Laterality Date  . Mitral valve repair  05/2010  . Av fistula placement, radiocephalic Left 05/24/2011    arm; "aien't never had to use it" (03/30/2013)  . Laceration repair Right ~ 1979  . Umbilical hernia repair  04/01/2012    Procedure: HERNIA REPAIR UMBILICAL ADULT;  Surgeon: Ardeth Sportsman, MD;  Location: [facility] OR;  Service: General;  Laterality: N/A;  supra umbilical  . Tee without cardioversion  08/25/2012    Procedure: TRANSESOPHAGEAL ECHOCARDIOGRAM (TEE);  Surgeon: Wendall Stade, MD;  Location: [facility] ENDOSCOPY;  Service: Cardiovascular;  Laterality: N/A;  . Mitral valve replacement  09/23/2012    Procedure: REDO MITRAL VALVE REPLACEMENT (MVR);  Surgeon: [name], MD;  Location: [facility] OR;  Service: Open Heart Surgery;  Laterality: N/A;  Nitric Oxide  . Tricuspid valve replacement  09/23/2012    Procedure: TRICUSPID VALVE REPAIR;  Surgeon: [name], MD;  Location: [facility] OR;  Service: Open Heart Surgery;  Laterality: N/A;  . Tee without cardioversion  12/04/2012    Procedure: TRANSESOPHAGEAL ECHOCARDIOGRAM (TEE);  Surgeon: Laurey Moralealton S McLean, MD;  [facility];  Service: Cardiovascular;  Laterality: N/A;  CV 1015/vicki, dl  . Cardioversion  6/21/30861/16/2014    Procedure: CARDIOVERSION;  Surgeon: Laurey Moralealton S McLean, MD;  [facility];  Service: Cardiovascular;  Laterality: N/A;  . Cardiac valve replacement      mitral  . Cardiac catheterization  05/26/2010    Hattie Perch/notes 03/30/2013   Family History  Problem Relation Age of Onset  . Hypertension Mother   . Heart disease Mother   . Heart disease Brother    History  Substance Use Topics  . Smoking status: Former Smoker -- 1.50 packs/day for 30 years    Types: Cigarettes    Quit date: 03/21/2011   . Smokeless tobacco: Former NeurosurgeonUser  . Alcohol Use: No     Comment: Quit alcohol in July of 2011    Review of Systems  Unable to perform ROS: Patient unresponsive      Allergies  Cashew nut oil; Food; and Strawberry  Home Medications   No current outpatient prescriptions on file. BP 171/115  Pulse 37  Temp(Src) 90.7 F (32.6 C) (Core (Comment))  Resp 0  Ht 5\' 6"  (1.676 m)  Wt 160 lb 12.5 oz (72.93 kg)  BMI 25.96 kg/m2  SpO2 100% Physical Exam  Nursing note and vitals reviewed. Constitutional: He appears well-developed and well-nourished.  Pulseless, unresponsive, being bagged by EMS, CPR re-initiated  HENT:  Head: Normocephalic and atraumatic.  Mouth/Throat: Oropharynx is clear and moist.  Eyes:  Pupils fixed and dilated.  No corneal reflex.  Neck: Normal range of motion. JVD present.  Cardiovascular:  Pulsless; PEA bradycardia on monitor  Pulmonary/Chest:  Breath sounds only with bagging; equal bilaterally  Abdominal: Soft. He exhibits no distension.  Musculoskeletal: He exhibits no edema.  No apparent trauma  Neurological:  Pupils fixed and dilated; unresponsive; exhibits occasional clonic activity of bilateral upper and lower extremities.  No purposeful movement.  No corneal reflex.  (Had a gag minutes ago per EMS when they attempted intubation.)  Skin: Skin is warm and dry.  Possible dialysis fistula to left arm    ED Course  INTUBATION Performed by: Toney SangPIPPIN, Laquinda Moller Authorized by: Toney SangPIPPIN, Naseem Varden Consent: The procedure was performed in an emergent situation. Patient identity confirmed: arm band and hospital-assigned identification number Indications: respiratory failure Intubation method: video-assisted Patient status: paralyzed (RSI) Sedatives: etomidate Paralytic: rocuronium Laryngoscope size: Mac 4 Tube size: 7.5 mm Tube type: cuffed Number of attempts: 1 Cords visualized: yes Post-procedure assessment: chest rise and CO2 detector Breath sounds:  equal and absent over the epigastrium Cuff inflated: yes Tube secured with: ETT holder Chest x-ray interpreted by me. Chest x-ray findings: endotracheal tube in appropriate position Patient tolerance: Patient tolerated the procedure well with no immediate complications.  External pacer Date/Time: 02/09/2014 4:09 PM Performed by: Toney SangPIPPIN, Aurelie Dicenzo Authorized by: Toney SangPIPPIN, Gennifer Potenza Consent: The procedure was performed in an emergent situation. Patient identity confirmed: arm band and hospital-assigned identification number Local anesthesia used: no Patient sedated: no   (including critical care time) Labs Review Labs Reviewed  CBC WITH DIFFERENTIAL - Abnormal; Notable for the following:    WBC 14.7 (*)    RDW 17.7 (*)    Platelets 118 (*)    Neutrophils Relative % 38 (*)    Lymphocytes Relative 53 (*)    Lymphs Abs 8.3 (*)    All other components  within normal limits  COMPREHENSIVE METABOLIC PANEL - Abnormal; Notable for the following:    CO2 15 (*)    Glucose, Bld 172 (*)    BUN 36 (*)    Creatinine, Ser 3.12 (*)    Calcium 11.0 (*)    Albumin 3.1 (*)    AST 39 (*)    Alkaline Phosphatase 159 (*)    GFR calc non Af Amer 21 (*)    GFR calc Af Amer 25 (*)    All other components within normal limits  URINALYSIS, ROUTINE W REFLEX MICROSCOPIC - Abnormal; Notable for the following:    APPearance CLOUDY (*)    Protein, ur >300 (*)    All other components within normal limits  URINE RAPID DRUG SCREEN (HOSP PERFORMED) - Abnormal; Notable for the following:    Cocaine POSITIVE (*)    All other components within normal limits  URINE MICROSCOPIC-ADD ON - Abnormal; Notable for the following:    Bacteria, UA FEW (*)    All other components within normal limits  GLUCOSE, CAPILLARY - Abnormal; Notable for the following:    Glucose-Capillary 155 (*)    All other components within normal limits  PROTIME-INR - Abnormal; Notable for the following:    Prothrombin Time 17.6 (*)    All other  components within normal limits  APTT - Abnormal; Notable for the following:    aPTT 85 (*)    All other components within normal limits  I-STAT CG4 LACTIC ACID, ED - Abnormal; Notable for the following:    Lactic Acid, Venous 11.07 (*)    All other components within normal limits  I-STAT CHEM 8, ED - Abnormal; Notable for the following:    BUN 53 (*)    Creatinine, Ser 3.20 (*)    Glucose, Bld 164 (*)    Calcium, Ion 1.51 (*)    All other components within normal limits  I-STAT ARTERIAL BLOOD GAS, ED - Abnormal; Notable for the following:    pH, Arterial 7.115 (*)    pCO2 arterial 56.7 (*)    pO2, Arterial 142.0 (*)    Bicarbonate 19.1 (*)    Acid-base deficit 12.0 (*)    All other components within normal limits  I-STAT TROPOININ, ED   Imaging Review Dg Chest Port 1 View  01/20/2014   CLINICAL DATA:  Post left central line  EXAM: PORTABLE CHEST - 1 VIEW  COMPARISON:  Prior film same day  FINDINGS: Cardiomediastinal silhouette is stable. Status post median sternotomy. Stable NG tube and endotracheal tube position. There is left IJ central line with tip in SVC. No diagnostic pneumothorax. Hazy interstitial prominence and airspace disease bilaterally suspicious for interstitial edema or bilateral pneumonia.  IMPRESSION: Status post median sternotomy. Stable NG tube and endotracheal tube position. There is left IJ central line with tip in SVC. No diagnostic pneumothorax. Hazy interstitial prominence and airspace disease bilaterally suspicious for interstitial edema or bilateral pneumonia.   Electronically Signed   By: Natasha Mead M.D.   On: 01/18/2014 20:54   Dg Chest Port 1 View  01/17/2014   CLINICAL DATA:  Endotracheal tube placement  EXAM: PORTABLE CHEST - 1 VIEW  COMPARISON:  01/16/2014  FINDINGS: Cardiomegaly again noted. Status post median sternotomy and cardiac valve replacement. NG tube in place. Endotracheal tube in place with tip 3 cm above the carina. No diagnostic pneumothorax.  There is hazy airspace is an interstitial prominence bilaterally highly suspicious for bilateral pulmonary edema rather than bilateral pneumonia.  IMPRESSION: Endotracheal tube in place. No diagnostic pneumothorax. NG tube in place. Hazy airspace disease and interstitial prominence bilaterally suspicious for bilateral pulmonary edema rather than pneumonia. Clinical correlation is necessary.   Electronically Signed   By: Natasha Mead M.D.   On: 01/25/2014 20:50     ST elevations in aVR, V1, V2; diffuse ST depressions  MDM   Cardiac arrest, witnessed by brother.  No further history available from patient or family. Asystole initially.  EMS performed ACLS.  Intermittently in PEA/shockable rhythm. They gave epi.  They defibrillated once. They obtained ROSC X 2.  They performe 20 min CPR pre-hospital.  Just prior to arrival, bradycardia, pacing externaly.  Brief review of records: CHF, CKD, cocaine abuse, mitral and tricuspid valve replacement  EKG shows STE in aVR, V1, V2.  Diffuse depressions.  Does not meet STEMI criteria, but given ischemic changes in setting of arrest, cardiology consulted.  The cardiologist determined not to take patient to cath lab given poor neurologic prognosis and he does not feel this is likely to be a purely ischemic cardiac arrest.  Prolonged resuscitation in the ED.  We performed CPR with ROSC several times.  Gave 5 doses of epi.  Started levophed gtt.  When hypotension became refractory to high rate of levophed, initiated dopamine as well.  Gave bolus amiodarone.  Held amio infusion at cardiology request.  Intermittently paced externally for bradycardia, however, poor capture at times; cardiology elected against transvenous pacing, and patient eventually stabilized, became normotensive (on both pressors) with HR in 70s, intrinsic.  He was given calcium X 2 empirically at initiation of ED resuscitation, however, istat chemistry shows normal potassium.  He was given bicarb for  acidosis.  I intubated the patient with video laryngoscope; he maintained sats in the upper 70s to low 80s.  Optimized vent settings.  Eventually sats improved to 90s.  Cooling was initiated in the ED.  Critical care was consulted and admitted the patient for further management.  Labs and imaging reviewed as detailed above.   Final diagnoses:  Cardiac arrest  Acute on chronic combined systolic and diastolic congestive heart failure, NYHA class 3  Acute respiratory failure with hypoxia  Atrial flutter  Cocaine abuse  Bradycardia  Junctional bradycardia  CKD (chronic kidney disease), stage IV  Metabolic acidosis Likely anoxic brain injury      Toney Sang, MD 02/09/14 1620

## 2014-02-08 NOTE — H&P (Signed)
PULMONARY / CRITICAL CARE MEDICINE   Name: Ryan Winters MRN: [id number] DOB: 11/12/1961    ADMISSION DATE:  01/21/2014  REFERRING MD :  Dr. Rhunette [name]  PRIMARY SERVICE: PCCM  CHIEF COMPLAINT:  Cardiac Arrest   BRIEF PATIENT DESCRIPTION: 53 y/o M admitted on 3/23 after witnessed cardiac arrest at home.  Greater than 20 min CPR at home & approx 20 min in ER (arrest after arrival).    SIGNIFICANT EVENTS / STUDIES:  3/23 - admit with witnessed cardiac arrest.  Initial rhythm per EMS was asystole, had return of shockable rhythm, epi x 2 - total of ~ 20 min.  Tx to ER, upon arrival to ER was profoundly bradhycardic with 2nd arrest.  Bicarb x2, epi x3 with approx 20 min of CPR.  ER eval noted UDS + for cocaine.   3/23 - ECHO>>>  LINES / TUBES: OETT 3/23>>> L Rad Aline 3/23>>> L IJ TLC 3/23>>>  CULTURES:  ANTIBIOTICS:   HISTORY OF PRESENT ILLNESS:  53 y/o M with PMH of bipolar / schizophrenia, CHF, HLD, Valvular heart disease with MVR / TVR (tissue by echo), GERD, CKD and cocaine abuse who presented to [facility] ER via EMS on 3/23 after witnessed cardiac arrest at home.  Initial rhythm per EMS was asystole, CPR initiated with return of shockable rhythm, epi x 2 - total of ~ 20 min.  Tx to ER, upon arrival to ER was profoundly bradycardic with 2nd arrest.  Attempts at external pacing made without success. Bicarb x2, epi x3 with approx 20 min of CPR.  ER eval noted UDS + for cocaine.  Pt evaluated by Cardiology in ER and felt not be an NSTEMI. Hypothermia protocol initiated in ER.  PCCM called for ICU admit.   No family available at time of admission.  Information obtained from previous medical providers.    PAST MEDICAL HISTORY :  Past Medical History  Diagnosis Date  . Hypertension   . Bipolar disorder   . Schizophrenia     h/o hospitalized inpt therapy @ the [facility]  . Valvular heart disease     a. 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 b. Had bioprosthetic MV  replacement with TV repair 09/2012.  Marland Kitchen Hyperlipidemia   . H/O: suicide attempt 1998    was hospitalized at [facility]  . Anemia     Hx of iron infusions  . Gastroparesis   . Arthritis   . Cocaine abuse     per pt none x 3 yrs however continues to "handle" it and deal it with + UDS.  Marland Kitchen Anxiety   . CHF (congestive heart failure)     a. EF 25-30% by echo 12/02/12, but 55% by TEE 12/04/12.  Marland Kitchen GERD (gastroesophageal reflux disease)   . Depression   . Vertigo   . Itchy skin   . Peripheral neuropathy     BLE  . Bilateral lower abdominal cramping   . Shortness of breath     "periodically; happens at any time" (03/30/13)  . Generalized headaches     "monthly" (03/30/2013)  . Chronic kidney disease     has fistula, not on HD; "go to [facility] q 90 days to have kidneys checked out" (03/30/2013)  . Atrial tachycardia     a. dx 11/2012: tx with amiodarone, cardioversion (discharge rhythm: likely junctional).   Past Surgical History  Procedure Laterality Date  . Mitral valve repair  05/2010  . Av fistula placement, radiocephalic Left 05/24/2011  arm; "aien't never had to use it" (03/30/2013)  . Laceration repair Right ~ 1979  . Umbilical hernia repair  04/01/2012    Procedure: HERNIA REPAIR UMBILICAL ADULT;  Surgeon: Ardeth Sportsman, MD;  Location: [facility] OR;  Service: General;  Laterality: N/A;  supra umbilical  . Tee without cardioversion  08/25/2012    Procedure: TRANSESOPHAGEAL ECHOCARDIOGRAM (TEE);  Surgeon: Wendall Stade, MD;  Location: [facility] ENDOSCOPY;  Service: Cardiovascular;  Laterality: N/A;  . Mitral valve replacement  09/23/2012    Procedure: REDO MITRAL VALVE REPLACEMENT (MVR);  Surgeon: [name], MD;  Location: [facility] OR;  Service: Open Heart Surgery;  Laterality: N/A;  Nitric Oxide  . Tricuspid valve replacement  09/23/2012    Procedure: TRICUSPID VALVE REPAIR;  Surgeon: [name], MD;  Location: [facility] OR;  Service: Open Heart Surgery;  Laterality: N/A;  . Tee without  cardioversion  12/04/2012    Procedure: TRANSESOPHAGEAL ECHOCARDIOGRAM (TEE);  Surgeon: Laurey Morale, MD;  Location: [facility] ENDOSCOPY;  Service: Cardiovascular;  Laterality: N/A;  CV 1015/vicki, dl  . Cardioversion  1/61/0960    Procedure: CARDIOVERSION;  Surgeon: Laurey Morale, MD;  Location: [facility] ENDOSCOPY;  Service: Cardiovascular;  Laterality: N/A;  . Cardiac valve replacement      mitral  . Cardiac catheterization  05/26/2010    Hattie Perch 03/30/2013   Prior to Admission medications   Medication Sig Start Date End Date Taking? Authorizing Provider  acetaminophen (TYLENOL) 325 MG tablet Take 650 mg by mouth every 6 (six) hours as needed for pain.    Historical Provider, MD  albuterol (PROVENTIL HFA;VENTOLIN HFA) 108 (90 BASE) MCG/ACT inhaler Inhale 2 puffs into the lungs every 6 (six) hours as needed for wheezing.    Historical Provider, MD  ARIPiprazole (ABILIFY) 5 MG tablet Take 5 mg by mouth daily.     Historical Provider, MD  aspirin EC 81 MG tablet Take 81 mg by mouth daily.    Historical Provider, MD  carvedilol (COREG) 25 MG tablet Take 0.5 tablets (12.5 mg total) by mouth 2 (two) times daily with a meal. 01/17/14   Brooke O Edmisten, PA-C  furosemide (LASIX) 40 MG tablet Take 40 mg by mouth 2 (two) times daily.    Historical Provider, MD  hydrALAZINE (APRESOLINE) 100 MG tablet Take 0.5 tablets (50 mg total) by mouth 2 (two) times daily. 01/17/14   Brooke O Edmisten, PA-C  hydrocortisone cream 1 % Apply topically as needed for itching. 11/22/13   Darden Palmer, MD  hydrOXYzine (VISTARIL) 50 MG capsule Take 50 mg by mouth at bedtime.    Historical Provider, MD  metoCLOPramide (REGLAN) 10 MG tablet Take 1 tablet (10 mg total) by mouth 4 (four) times daily. 09/14/13   Boykin Peek, MD  omeprazole (PRILOSEC) 40 MG capsule Take 40 mg by mouth 2 (two) times daily as needed (heartburn).    Historical Provider, MD   Allergies  Allergen Reactions  . Cashew Nut Oil Anaphylaxis  . Food Anaphylaxis     Strawberries, Cashews, causes throat to swell - affects breathing  . Strawberry Anaphylaxis    FAMILY HISTORY:  Family History  Problem Relation Age of Onset  . Hypertension Mother   . Heart disease Mother   . Heart disease Brother    SOCIAL HISTORY:  reports that he quit smoking about 2 years ago. His smoking use included Cigarettes. He has a 45 pack-year smoking history. He has quit using smokeless tobacco. He reports that he uses illicit  drugs (Cocaine and Marijuana). He reports that he does not drink alcohol.  REVIEW OF SYSTEMS:  Unable to complete as pt is altered on vent, no family available.   SUBJECTIVE:   VITAL SIGNS: Pulse Rate:  [63-77] 77 (03/23 2021) Resp:  [18] 18 (03/23 2021) BP: (64-160)/(46-97) 144/80 mmHg (03/23 2021) SpO2:  [96 %-98 %] 98 % (03/23 2021) FiO2 (%):  [100 %] 100 % (03/23 2021) Weight:  [168 lb (76.204 kg)] 168 lb (76.204 kg) (03/23 1700)  HEMODYNAMICS:    VENTILATOR SETTINGS: Vent Mode:  [-] PRVC FiO2 (%):  [100 %] 100 % Set Rate:  [18 bmp] 18 bmp PEEP:  [10 cmH20] 10 cmH20  INTAKE / OUTPUT: Intake/Output     03/23 0701 - 03/24 0700   I.V. (mL/kg) 2000 (26.2)   Total Intake(mL/kg) 2000 (26.2)   Net +2000         PHYSICAL EXAMINATION: General:  wdwn adult male on vent Neuro:  Pupils 4mm NR, no gag or cough, no spontaneous movement, no response to painful stimuli HEENT:  Mm pink/moist, OETT, jvd noted Cardiovascular:  s1s2 rrr, brady Lungs:  resp's even/non-labored, lungs bilaterally clear Abdomen:  Flat, soft, bsx4 active Musculoskeletal:  No acute deformities Skin:  Warm/dry, no edema, ? LUE fistula  LABS:  CBC  Recent Labs Lab Feb 13, 2014 1848 01/19/2014 1906  WBC 14.7*  --   HGB 14.1 15.6  HCT 44.4 46.0  PLT 118*  --    Coag's No results found for this basename: APTT, INR,  in the last 168 hours BMET  Recent Labs Lab 02/06/2014 1848 13-Feb-2014 1906  NA 141 139  K 5.0 4.7  CL 103 110  CO2 15*  --   BUN 36* 53*   CREATININE 3.12* 3.20*  GLUCOSE 172* 164*   Electrolytes  Recent Labs Lab 13-Feb-2014 1848  CALCIUM 11.0*   Sepsis Markers  Recent Labs Lab 02/16/2014 1857  LATICACIDVEN 11.07*   ABG No results found for this basename: PHART, PCO2ART, PO2ART,  in the last 168 hours Liver Enzymes  Recent Labs Lab 02/06/2014 1848  AST 39*  ALT 20  ALKPHOS 159*  BILITOT 0.7  ALBUMIN 3.1*   Cardiac Enzymes No results found for this basename: TROPONINI, PROBNP,  in the last 168 hours Glucose No results found for this basename: GLUCAP,  in the last 168 hours  Imaging No results found.  ASSESSMENT / PLAN:  PULMONARY A: Acute Respiratory Failure - in setting of cardiac arrest Respiratory Acidosis P:   -full vent support, adjust vent settings 550 / 22 / 8 / 100% -wean PEEP / FiO2 for sats > 93% -f/u CXR, ABG -PRN albuterol  -sedation per hypothermia protocol  -place central line, CVP assessment   CARDIOVASCULAR A:  Cardiac Arrest - initial rhythm asystole, approx 40 min total CPR, Cocaine POSITIVE on admit.  ECHO 03/2013 with EF 50-55%, nml wall motion, PAS 46 Bradycardia  Shock - post arrest CHF Hx MVR / TVR - ? Tissue valves Cocaine Abuse Hx Malignant HTN Hx Atrial Flutter P:  -hypothermia protocol -avoid beta blocker with acute cocaine ingestion  -levophed / dopamine for MAP > 85, HR >55 -may have to d/c hypothermia protocol pending HR response -appreciate Cardiology evaluation   RENAL A:   Lactic acidosis CKD - baseline cr ~ 3.1, followed by Dr. Arrie Aran Hyperkalemia in setting of acidosis P:   -Q2 BMP per protocol -post bicarb x3 in ER -replace lytes as indicated -treat hyperkalemia > Calcium, D50,  Insulin  GASTROINTESTINAL A:   NPO P:   -protonix for SUP -consider TF in am 3/24  HEMATOLOGIC A:   Thrombocytopenia  P:  -monitor platelets, bleeding -heprain gtt per Cardiology   INFECTIOUS A:   No evidence of acute infectious process P:    -monitor fever curve / leukocytosis  ENDOCRINE A:   Hyperglycemia   P:   -SSI  NEUROLOGIC A:   Bipolar Disorder Schizophrenia Peripheral Neuropathy Induced Hypothermia  P:   -sedation / paralytics per hypothermia protocol  Canary BrimBrandi Ollis, NP-C [facility] Pgr: [phone] or [phone]  Attending:  I have seen and examined the patient with nurse practitioner/resident and agree with the note above.   Presumably cardiac arrest from CHF exacerbation due to cocaine abuse.  Very high possibility of brain injury given the duration of CPR.  Will continue Arctic sun given his relatively young age, but his current exam is poor.    I explained to his wife via phone that his risk of anoxic brain injury is high.  She currently doesn't live with him, but they remain married.  I addressed the question of further CPR, but she wanted to sleep on this overnight.   I have personally obtained a history, examined the patient, evaluated laboratory and imaging results, formulated the assessment and plan and placed orders.  CRITICAL CARE: The patient is critically ill with multiple organ systems failure and requires high complexity decision making for assessment and support, frequent evaluation and titration of therapies, application of advanced monitoring technologies and extensive interpretation of multiple databases. Critical Care Time devoted to patient care services described in this note is 90 minutes.   Ryan KidaMCQUAID, DOUGLAS [place] PCCM Pager: [phone] Cell: [phone] If no response, call [phone]   02/15/2014, 8:34 PM

## 2014-02-09 ENCOUNTER — Encounter (HOSPITAL_COMMUNITY): Payer: Self-pay | Admitting: Neurology

## 2014-02-09 ENCOUNTER — Inpatient Hospital Stay (HOSPITAL_COMMUNITY): Payer: Medicaid Other

## 2014-02-09 DIAGNOSIS — N184 Chronic kidney disease, stage 4 (severe): Secondary | ICD-10-CM

## 2014-02-09 DIAGNOSIS — I379 Nonrheumatic pulmonary valve disorder, unspecified: Secondary | ICD-10-CM

## 2014-02-09 LAB — TROPONIN I
Troponin I: 1.58 ng/mL (ref <0.30)
Troponin I: 2.06 ng/mL (ref <0.30)
Troponin I: 1.16 ng/mL (ref <0.30)

## 2014-02-09 LAB — POCT I-STAT, CHEM 8
BUN: 42 mg/dL — ABNORMAL HIGH (ref 6–23)
BUN: 43 mg/dL — AB (ref 6–23)
BUN: 43 mg/dL — ABNORMAL HIGH (ref 6–23)
BUN: 44 mg/dL — AB (ref 6–23)
BUN: 45 mg/dL — ABNORMAL HIGH (ref 6–23)
CALCIUM ION: 1.32 mmol/L — AB (ref 1.12–1.23)
CHLORIDE: 108 meq/L (ref 96–112)
CREATININE: 3.6 mg/dL — AB (ref 0.50–1.35)
Calcium, Ion: 1.41 mmol/L — ABNORMAL HIGH (ref 1.12–1.23)
Calcium, Ion: 1.31 mmol/L — ABNORMAL HIGH (ref 1.12–1.23)
Calcium, Ion: 1.37 mmol/L — ABNORMAL HIGH (ref 1.12–1.23)
Calcium, Ion: 1.15 mmol/L (ref 1.12–1.23)
Chloride: 105 mEq/L (ref 96–112)
Chloride: 110 mEq/L (ref 96–112)
Chloride: 114 mEq/L — ABNORMAL HIGH (ref 96–112)
Chloride: 123 mEq/L — ABNORMAL HIGH (ref 96–112)
Creatinine, Ser: 3.8 mg/dL — ABNORMAL HIGH (ref 0.50–1.35)
Creatinine, Ser: 4 mg/dL — ABNORMAL HIGH (ref 0.50–1.35)
Creatinine, Ser: 4.2 mg/dL — ABNORMAL HIGH (ref 0.50–1.35)
Creatinine, Ser: 4.1 mg/dL — ABNORMAL HIGH (ref 0.50–1.35)
GLUCOSE: 122 mg/dL — AB (ref 70–99)
Glucose, Bld: 119 mg/dL — ABNORMAL HIGH (ref 70–99)
Glucose, Bld: 166 mg/dL — ABNORMAL HIGH (ref 70–99)
Glucose, Bld: 272 mg/dL — ABNORMAL HIGH (ref 70–99)
Glucose, Bld: 81 mg/dL (ref 70–99)
HCT: 44 % (ref 39.0–52.0)
HEMATOCRIT: 44 % (ref 39.0–52.0)
HEMATOCRIT: 42 % (ref 39.0–52.0)
HEMATOCRIT: 43 % (ref 39.0–52.0)
HEMATOCRIT: 40 % (ref 39.0–52.0)
HEMOGLOBIN: 13.6 g/dL (ref 13.0–17.0)
HEMOGLOBIN: 14.3 g/dL (ref 13.0–17.0)
HEMOGLOBIN: 14.6 g/dL (ref 13.0–17.0)
HEMOGLOBIN: 15 g/dL (ref 13.0–17.0)
HEMOGLOBIN: 15 g/dL (ref 13.0–17.0)
POTASSIUM: 3.1 meq/L — AB (ref 3.7–5.3)
POTASSIUM: 3.2 meq/L — AB (ref 3.7–5.3)
POTASSIUM: 4.2 meq/L (ref 3.7–5.3)
Potassium: 3.9 mEq/L (ref 3.7–5.3)
Potassium: 4.3 mEq/L (ref 3.7–5.3)
SODIUM: 145 meq/L (ref 137–147)
SODIUM: 143 meq/L (ref 137–147)
SODIUM: 144 meq/L (ref 137–147)
SODIUM: 136 meq/L — AB (ref 137–147)
Sodium: 142 mEq/L (ref 137–147)
TCO2: 19 mmol/L (ref 0–100)
TCO2: 21 mmol/L (ref 0–100)
TCO2: 22 mmol/L (ref 0–100)
TCO2: 22 mmol/L (ref 0–100)
TCO2: 23 mmol/L (ref 0–100)

## 2014-02-09 LAB — GLUCOSE, CAPILLARY
GLUCOSE-CAPILLARY: 155 mg/dL — AB (ref 70–99)
GLUCOSE-CAPILLARY: 66 mg/dL — AB (ref 70–99)
GLUCOSE-CAPILLARY: 69 mg/dL — AB (ref 70–99)
GLUCOSE-CAPILLARY: 79 mg/dL (ref 70–99)
GLUCOSE-CAPILLARY: 82 mg/dL (ref 70–99)
GLUCOSE-CAPILLARY: 82 mg/dL (ref 70–99)
GLUCOSE-CAPILLARY: 82 mg/dL (ref 70–99)
Glucose-Capillary: 184 mg/dL — ABNORMAL HIGH (ref 70–99)
Glucose-Capillary: 211 mg/dL — ABNORMAL HIGH (ref 70–99)
Glucose-Capillary: 134 mg/dL — ABNORMAL HIGH (ref 70–99)
Glucose-Capillary: 76 mg/dL (ref 70–99)
Glucose-Capillary: 62 mg/dL — ABNORMAL LOW (ref 70–99)
Glucose-Capillary: 87 mg/dL (ref 70–99)
Glucose-Capillary: 138 mg/dL — ABNORMAL HIGH (ref 70–99)
Glucose-Capillary: 115 mg/dL — ABNORMAL HIGH (ref 70–99)

## 2014-02-09 LAB — MRSA PCR SCREENING: MRSA by PCR: NEGATIVE

## 2014-02-09 LAB — HEPATIC FUNCTION PANEL
ALBUMIN: 2.9 g/dL — AB (ref 3.5–5.2)
ALT: 52 U/L (ref 0–53)
AST: 110 U/L — ABNORMAL HIGH (ref 0–37)
Alkaline Phosphatase: 149 U/L — ABNORMAL HIGH (ref 39–117)
BILIRUBIN TOTAL: 2.1 mg/dL — AB (ref 0.3–1.2)
Bilirubin, Direct: 1 mg/dL — ABNORMAL HIGH (ref 0.0–0.3)
Indirect Bilirubin: 1.1 mg/dL — ABNORMAL HIGH (ref 0.3–0.9)
Total Protein: 6.4 g/dL (ref 6.0–8.3)

## 2014-02-09 LAB — BASIC METABOLIC PANEL
BUN: 46 mg/dL — AB (ref 6–23)
BUN: 58 mg/dL — AB (ref 6–23)
BUN: 60 mg/dL — ABNORMAL HIGH (ref 6–23)
CALCIUM: 9 mg/dL (ref 8.4–10.5)
CO2: 19 mEq/L (ref 19–32)
CO2: 17 mEq/L — ABNORMAL LOW (ref 19–32)
CO2: 18 mEq/L — ABNORMAL LOW (ref 19–32)
CREATININE: 4.05 mg/dL — AB (ref 0.50–1.35)
Calcium: 9.7 mg/dL (ref 8.4–10.5)
Calcium: 8.9 mg/dL (ref 8.4–10.5)
Chloride: 108 mEq/L (ref 96–112)
Chloride: 106 mEq/L (ref 96–112)
Chloride: 106 mEq/L (ref 96–112)
Creatinine, Ser: 3.6 mg/dL — ABNORMAL HIGH (ref 0.50–1.35)
Creatinine, Ser: 4.23 mg/dL — ABNORMAL HIGH (ref 0.50–1.35)
GFR calc Af Amer: 21 mL/min — ABNORMAL LOW (ref >90)
GFR calc non Af Amer: 15 mL/min — ABNORMAL LOW (ref >90)
GFR, EST AFRICAN AMERICAN: 18 mL/min — AB (ref >90)
GFR, EST AFRICAN AMERICAN: 17 mL/min — AB (ref >90)
GFR, EST NON AFRICAN AMERICAN: 18 mL/min — AB (ref >90)
GFR, EST NON AFRICAN AMERICAN: 16 mL/min — AB (ref >90)
GLUCOSE: 77 mg/dL (ref 70–99)
Glucose, Bld: 92 mg/dL (ref 70–99)
Glucose, Bld: 132 mg/dL — ABNORMAL HIGH (ref 70–99)
Potassium: 3.4 mEq/L — ABNORMAL LOW (ref 3.7–5.3)
Potassium: 6.2 mEq/L — ABNORMAL HIGH (ref 3.7–5.3)
Potassium: 6 mEq/L — ABNORMAL HIGH (ref 3.7–5.3)
Sodium: 145 mEq/L (ref 137–147)
Sodium: 139 mEq/L (ref 137–147)
Sodium: 140 mEq/L (ref 137–147)

## 2014-02-09 LAB — CBC
HCT: 39.2 % (ref 39.0–52.0)
Hemoglobin: 13.5 g/dL (ref 13.0–17.0)
MCH: 28.4 pg (ref 26.0–34.0)
MCHC: 34.4 g/dL (ref 30.0–36.0)
MCV: 82.4 fL (ref 78.0–100.0)
PLATELETS: 113 10*3/uL — AB (ref 150–400)
RBC: 4.76 MIL/uL (ref 4.22–5.81)
RDW: 17.6 % — AB (ref 11.5–15.5)
WBC: 17.7 10*3/uL — AB (ref 4.0–10.5)

## 2014-02-09 LAB — POCT I-STAT 3, ART BLOOD GAS (G3+)
Acid-base deficit: 3 mmol/L — ABNORMAL HIGH (ref 0.0–2.0)
Bicarbonate: 22 mEq/L (ref 20.0–24.0)
O2 Saturation: 99 %
PCO2 ART: 32.4 mmHg — AB (ref 35.0–45.0)
TCO2: 23 mmol/L (ref 0–100)
pH, Arterial: 7.423 (ref 7.350–7.450)
pO2, Arterial: 138 mmHg — ABNORMAL HIGH (ref 80.0–100.0)

## 2014-02-09 LAB — PROTIME-INR
INR: 1.58 — ABNORMAL HIGH (ref 0.00–1.49)
Prothrombin Time: 18.4 seconds — ABNORMAL HIGH (ref 11.6–15.2)

## 2014-02-09 LAB — HEPARIN LEVEL (UNFRACTIONATED)
HEPARIN UNFRACTIONATED: 0.19 [IU]/mL — AB (ref 0.30–0.70)
HEPARIN UNFRACTIONATED: 0.33 [IU]/mL (ref 0.30–0.70)

## 2014-02-09 LAB — APTT: APTT: 83 s — AB (ref 24–37)

## 2014-02-09 LAB — PATHOLOGIST SMEAR REVIEW: Path Review: REACTIVE

## 2014-02-09 MED ORDER — DEXTROSE 50 % IV SOLN
INTRAVENOUS | Status: AC
  Administered 2014-02-09: 25 mL
  Filled 2014-02-09: qty 50

## 2014-02-09 MED ORDER — HEPARIN (PORCINE) IN NACL 100-0.45 UNIT/ML-% IJ SOLN
800.0000 [IU]/h | INTRAMUSCULAR | Status: DC
  Administered 2014-02-10: 800 [IU]/h via INTRAVENOUS
  Filled 2014-02-09 (×2): qty 250

## 2014-02-09 MED ORDER — INFLUENZA VAC SPLIT QUAD 0.5 ML IM SUSP
0.5000 mL | INTRAMUSCULAR | Status: DC
  Filled 2014-02-09: qty 0.5

## 2014-02-09 MED ORDER — HEPARIN BOLUS VIA INFUSION
2000.0000 [IU] | Freq: Once | INTRAVENOUS | Status: DC
  Filled 2014-02-09: qty 2000

## 2014-02-09 MED ORDER — DEXTROSE 50 % IV SOLN
INTRAVENOUS | Status: AC
  Filled 2014-02-09: qty 50

## 2014-02-09 MED ORDER — POTASSIUM CHLORIDE 10 MEQ/50ML IV SOLN
10.0000 meq | INTRAVENOUS | Status: AC
  Administered 2014-02-09 (×3): 10 meq via INTRAVENOUS
  Filled 2014-02-09 (×3): qty 50

## 2014-02-09 MED ORDER — BIOTENE DRY MOUTH MT LIQD
15.0000 mL | Freq: Four times a day (QID) | OROMUCOSAL | Status: DC
  Administered 2014-02-09 – 2014-02-11 (×11): 15 mL via OROMUCOSAL

## 2014-02-09 MED ORDER — DEXTROSE-NACL 5-0.9 % IV SOLN
INTRAVENOUS | Status: DC
Start: 2014-02-09 — End: 2014-02-12
  Administered 2014-02-09: 50 mL/h via INTRAVENOUS
  Administered 2014-02-10: 15:00:00 via INTRAVENOUS

## 2014-02-09 MED ORDER — DEXTROSE 50 % IV SOLN
1.0000 | Freq: Once | INTRAVENOUS | Status: AC
  Administered 2014-02-09: 50 mL via INTRAVENOUS

## 2014-02-09 MED ORDER — HEPARIN BOLUS VIA INFUSION
1500.0000 [IU] | Freq: Once | INTRAVENOUS | Status: AC
  Administered 2014-02-09: 1500 [IU] via INTRAVENOUS
  Filled 2014-02-09: qty 1500

## 2014-02-09 MED ORDER — SODIUM CHLORIDE 0.9 % IV SOLN
1.0000 g | Freq: Once | INTRAVENOUS | Status: AC
  Administered 2014-02-09: 1 g via INTRAVENOUS
  Filled 2014-02-09: qty 10

## 2014-02-09 MED ORDER — SODIUM BICARBONATE 8.4 % IV SOLN
50.0000 meq | Freq: Once | INTRAVENOUS | Status: AC
  Administered 2014-02-09: 50 meq via INTRAVENOUS
  Filled 2014-02-09: qty 50

## 2014-02-09 MED ORDER — CHLORHEXIDINE GLUCONATE 0.12 % MT SOLN
15.0000 mL | Freq: Two times a day (BID) | OROMUCOSAL | Status: DC
  Administered 2014-02-09 – 2014-02-11 (×5): 15 mL via OROMUCOSAL
  Filled 2014-02-09 (×5): qty 15

## 2014-02-09 MED FILL — Norepinephrine Bitartrate IV Soln 1 MG/ML (Base Equivalent): INTRAMUSCULAR | Qty: 4 | Status: AC

## 2014-02-09 MED FILL — Medication: Qty: 1 | Status: AC

## 2014-02-09 NOTE — Progress Notes (Signed)
eLink Physician-Brief Progress Note Patient Name: Ryan Boutonntonio Haddox DOB: 1961/02/11 MRN: [id number]  Date of Service  02/09/2014   HPI/Events of Note   Hypokalemia  eICU Interventions  Potassium replaced   Intervention Category Intermediate Interventions: Electrolyte abnormality - evaluation and management  DETERDING,ELIZABETH 02/09/2014, 5:38 AM

## 2014-02-09 NOTE — Progress Notes (Signed)
Hypoglycemic Event  CBG: 69  Treatment: 1 AMP d50  Symptoms: n/a  Follow-up CBG: Time:13:04 CBG Result: 166  Possible Reasons for Event: NPO  Comments/MD notified:No    Axel FillerLeary, Raffaele Derise Beavan  Remember to initiate Hypoglycemia Order Set & complete

## 2014-02-09 NOTE — Progress Notes (Signed)
Hypoglycemic Event  CBG:66  Treatment: Dextrose 1 amp  Symptoms: n/a  Follow-up CBG: by night RN Luther Parodyaitlin Time: 1900 CBG Result: --  Possible Reasons for Event: NPO  Comments/MD notified:Dr Sood called; states he will start on Dextrose gtt.    Ryan FillerLeary, Ryan Winters  Remember to initiate Hypoglycemia Order Set & complete

## 2014-02-09 NOTE — ED Provider Notes (Signed)
I performed a history and physical examination of  Ryan Winters and discussed his management with the Resident Physician. I agree with the history, physical, assessment, and plan of care, with the following exceptions: None I was present for the following procedures: INTUBATION, external pacing.  Time Spent in Critical Care of the patient: 60 minutes  Time spent in discussions with the patient and family: 30 minutes  Brianda Beitler  CRITICAL CARE Performed by: Derwood KaplanNanavati, Cambrie Sonnenfeld   Total critical care time: 60 minutes  Critical care time was exclusive of separately billable procedures and treating other patients.  Critical care was necessary to treat or prevent imminent or life-threatening deterioration.  Critical care was time spent personally by me on the following activities: development of treatment plan with patient and/or surrogate as well as nursing, discussions with consultants, evaluation of patient's response to treatment, examination of patient, obtaining history from patient or surrogate, ordering and performing treatments and interventions, ordering and review of laboratory studies, ordering and review of radiographic studies, pulse oximetry and re-evaluation of patient's condition.   Cardiopulmonary Resuscitation (CPR) Procedure Note Directed/Performed by: Derwood KaplanNanavati, Brantlee Hinde I personally directed ancillary staff and/or performed CPR in an effort to regain return of spontaneous circulation and to maintain cardiac, neuro and systemic perfusion.    Pt comes in with cc of cardiac arrest. Witnessed arrest. Came in with bradycarida - and lost pulse x 2 times in the ED. ACLS protocol initiated. Multiple round of epi and chest compression required. Eventually, patient had a palpable pulse, but was bradycardic and hypotensive. Cards consulted immediately- given known CAD, cocaine use and witnessed arrest and suttle EKG changes with no evidence of STEMI. Dr. Myrtis SerKatz evaluated the  patient from Cards, and decided against emergent Cath.  After that, we managed the patient in the ED, whilst awaiting Critical care to come and see the patient. Patient was started on dopamine and norepinephrine and also externally paced for a brief period of time. Labs showed severe lactic acidosis. CCM did take over the care after they arrived to the ER.    Derwood KaplanAnkit Merin Borjon, MD 02/09/14 [phone]

## 2014-02-09 NOTE — Progress Notes (Signed)
K 6.2 called to Dr. Craige CottaSood; MD to place orders.

## 2014-02-09 NOTE — Progress Notes (Signed)
Patient Name: Ryan Winters Date of Encounter: 02/09/2014  Active Problems:   SCHIZOPHRENIA   CKD (chronic kidney disease), stage IV   Cocaine abuse   Cardiac arrest   Acute respiratory failure with hypoxia   Length of Stay: 1  SUBJECTIVE  Intubated, sedated, paralyzed.  CURRENT MEDS . antiseptic oral rinse  15 mL Mouth Rinse QID  . artificial tears  1 application Both Eyes 3 times per day  . chlorhexidine  15 mL Mouth Rinse BID  . cisatracurium  0.1 mg/kg Intravenous Once  . fentaNYL  100 mcg Intravenous Once  . insulin aspart  0-9 Units Subcutaneous 6 times per day  . pantoprazole (PROTONIX) IV  40 mg Intravenous QHS    OBJECTIVE   Intake/Output Summary (Last 24 hours) at 02/09/14 1006 Last data filed at 02/09/14 0900  Gross per 24 hour  Intake 3372.31 ml  Output    330 ml  Net 3042.31 ml   Filed Weights   02/04/2014 1700 02/10/2014 2200 02/09/14 0500  Weight: 76.204 kg (168 lb) 72.93 kg (160 lb 12.5 oz) 72.93 kg (160 lb 12.5 oz)    PHYSICAL EXAM Filed Vitals:   02/09/14 0730 02/09/14 0800 02/09/14 0900 02/09/14 1000  BP: 142/95 151/99 148/96 152/117  Pulse: 48 49 54 53  Temp:  91.4 F (33 C) 90.3 F (32.4 C)   TempSrc:      Resp: 22 22 22 22   Height:      Weight:      SpO2: 100% 100% 100% 100%   General: unresponsive Head: no evidence of trauma, no myxedema, no xanthelasma; normal ears, nose and intubated oropharynx Neck: normal jugular venous pulsations and no hepatojugular reflux; brisk carotid pulses without delay and no carotid bruits Chest: clear to auscultation, no signs of consolidation by percussion or palpation, normal fremitus, symmetrical and full respiratory excursions Cardiovascular: normal position and quality of the apical impulse, regular rhythm, normal first and second heart sounds, no rubs or gallops, no murmur Abdomen: no tenderness or distention, no masses by palpation, no abnormal pulsatility or arterial bruits, normal bowel sounds,  no hepatosplenomegaly Extremities: no clubbing, cyanosis or edema; 2+ radial, ulnar and brachial pulses bilaterally; 2+ right femoral, posterior tibial and dorsalis pedis pulses; 2+ left femoral, posterior tibial and dorsalis pedis pulses; no subclavian or femoral bruits Neurological: grossly nonfocal  LABS  CBC  Recent Labs  01/29/2014 1848  02/09/14 0530 02/09/14 0859  WBC 14.7*  --  17.7*  --   NEUTROABS 6.0  --   --   --   HGB 14.1  < > 13.5 14.6  HCT 44.4  < > 39.2 43.0  MCV 88.3  --  82.4  --   PLT 118*  --  113*  --   < > = values in this interval not displayed. Basic Metabolic Panel  Recent Labs  01/22/2014 1848  02/09/14 0400 02/09/14 0451 02/09/14 0859  NA 141  < > 145 143 144  K 5.0  < > 3.4* 3.2* 4.2  CL 103  < > 108 114* 110  CO2 15*  --  19  --   --   GLUCOSE 172*  < > 77 81 119*  BUN 36*  < > 46* 42* 45*  CREATININE 3.12*  < > 3.60* 4.00* 4.20*  CALCIUM 11.0*  --  9.7  --   --   < > = values in this interval not displayed. Liver Function Tests  Recent Labs  01/18/2014 1848  AST 39*  ALT 20  ALKPHOS 159*  BILITOT 0.7  PROT 6.6  ALBUMIN 3.1*   No results found for this basename: LIPASE, AMYLASE,  in the last 72 hours Cardiac Enzymes  Recent Labs  01/20/2014 2156 02/09/14 0400  TROPONINI 1.58* 2.06*   Radiology Studies Imaging results have been reviewed and Dg Chest Port 1 View  02/09/2014   CLINICAL DATA:  Cardiac arrest.  EXAM: PORTABLE CHEST - 1 VIEW  COMPARISON:  02/09/2014  FINDINGS: Endotracheal tube, NG tube, and central venous catheter appear in good position. Pulmonary edema on the left has almost resolved. Pulmonary edema on the right has improved slightly.  Persistent cardiomegaly.  IMPRESSION: Improving pulmonary edema.   Electronically Signed   By: Geanie CooleyJim  Maxwell M.D.   On: 02/09/2014 07:08   Dg Chest Port 1 View  02/07/2014   CLINICAL DATA:  Post left central line  EXAM: PORTABLE CHEST - 1 VIEW  COMPARISON:  Prior film same day  FINDINGS:  Cardiomediastinal silhouette is stable. Status post median sternotomy. Stable NG tube and endotracheal tube position. There is left IJ central line with tip in SVC. No diagnostic pneumothorax. Hazy interstitial prominence and airspace disease bilaterally suspicious for interstitial edema or bilateral pneumonia.  IMPRESSION: Status post median sternotomy. Stable NG tube and endotracheal tube position. There is left IJ central line with tip in SVC. No diagnostic pneumothorax. Hazy interstitial prominence and airspace disease bilaterally suspicious for interstitial edema or bilateral pneumonia.   Electronically Signed   By: Natasha MeadLiviu  Pop M.D.   On: 01/27/2014 20:54   Dg Chest Port 1 View  02/10/2014   CLINICAL DATA:  Endotracheal tube placement  EXAM: PORTABLE CHEST - 1 VIEW  COMPARISON:  01/16/2014  FINDINGS: Cardiomegaly again noted. Status post median sternotomy and cardiac valve replacement. NG tube in place. Endotracheal tube in place with tip 3 cm above the carina. No diagnostic pneumothorax. There is hazy airspace is an interstitial prominence bilaterally highly suspicious for bilateral pulmonary edema rather than bilateral pneumonia.  IMPRESSION: Endotracheal tube in place. No diagnostic pneumothorax. NG tube in place. Hazy airspace disease and interstitial prominence bilaterally suspicious for bilateral pulmonary edema rather than pneumonia. Clinical correlation is necessary.   Electronically Signed   By: Natasha MeadLiviu  Pop M.D.   On: 01/28/2014 20:50    TELE No major arrhythmic events - junctional rhythm with occasional PVCs  ECG NSR, LVH  ASSESSMENT AND PLAN Neurological prognosis is poor due to prolonged resuscitation, delayed CPR, asystole as initial rhythm. Continue dopamine for bradycardia, which is likely hypothermia related. Echo today. Low suspicion for coronary event with normal cors by relatively recent cath. Minimal cTropI elevation readily explained by arrest. ECG with profound repol changes  that are due to LVH. May anticipate problems with ATN and acute on chronic renal failure or shock liver.   Thurmon FairMihai Baylen Buckner, MD, [facility] Center[facility]ACC [facility] [phone] office [phone] pager 02/09/2014 10:06 AM

## 2014-02-09 NOTE — Progress Notes (Signed)
ANTICOAGULATION CONSULT NOTE Pharmacy Consult for Heparin  Indication: ACS on [place] Protocol   Allergies  Allergen Reactions  . Cashew Nut Oil Anaphylaxis  . Food Anaphylaxis    Strawberries, Cashews, causes throat to swell - affects breathing  . Strawberry Anaphylaxis    Patient Measurements: Height: 5\' 6"  (167.6 cm) Weight: 160 lb 12.5 oz (72.93 kg) IBW/kg (Calculated) : 63.8  Vital Signs: Temp: 90.7 F (32.6 C) (03/24 1500) Temp src: Core (Comment) (03/24 1545) BP: 180/105 mmHg (03/24 1600) Pulse Rate: 42 (03/24 1600)  Labs:  Recent Labs  02/16/2014 1848  01/30/2014 2156  01/22/2014 2254  02/09/14 0400 02/09/14 0451 02/09/14 0500 02/09/14 0530 02/09/14 0859 02/09/14 0940 02/09/14 1304 02/09/14 1400  HGB 14.1  < >  --   < >  --   < >  --  14.3  --  13.5 14.6  --  13.6  --   HCT 44.4  < >  --   < >  --   < >  --  42.0  --  39.2 43.0  --  40.0  --   PLT 118*  --   --   --   --   --   --   --   --  113*  --   --   --   --   APTT  --   --   --   --  85*  --   --   --  83*  --   --   --   --   --   LABPROT  --   --   --   --  17.6*  --   --   --  18.4*  --   --   --   --   --   INR  --   --   --   --  1.49  --   --   --  1.58*  --   --   --   --   --   HEPARINUNFRC  --   --   --   --   --   --  0.19*  --   --   --   --   --   --  0.33  CREATININE 3.12*  < >  --   < >  --   < > 3.60* 4.00*  --   --  4.20*  --  4.10*  --   TROPONINI  --   --  1.58*  --   --   --  2.06*  --   --   --   --  1.16*  --   --   < > = values in this interval not displayed.  Estimated Creatinine Clearance: 19 ml/min (by C-G formula based on Cr of 4.1).   Assessment: 53 yo male with ACS s/p cardiac arrest, [place] protocol, for heparin.  Current level therapeutic on 600 units/hr.  Had order to increase to 750 units/hr this AM, but it was not done.  Repeat level at goal on 600 units/hr.  Goal of Therapy:  Heparin level 0.3-0.7 Monitor platelets by anticoagulation protocol: Yes    Plan:  Continue IV heparin at current rate. Recheck heparin level ~ 1 AM tomorrow (4 hrs after rewarming begins).  Ryan MooreJessica Valynn Winters, Pharm D, BCPS  Clinical Pharmacist Pager [phone]  02/09/2014 5:30 PM

## 2014-02-09 NOTE — Progress Notes (Signed)
ANTICOAGULATION CONSULT NOTE Pharmacy Consult for Heparin  Indication: ACS on [place] Protocol   Allergies  Allergen Reactions  . Cashew Nut Oil Anaphylaxis  . Food Anaphylaxis    Strawberries, Cashews, causes throat to swell - affects breathing  . Strawberry Anaphylaxis    Patient Measurements: Height: 5\' 6"  (167.6 cm) Weight: 165 lb 5.5 oz (75 kg) IBW/kg (Calculated) : 63.8  Vital Signs: Temp: 91.6 F (33.1 C) (03/24 0500) Temp src: Core (Comment) (03/24 0400) BP: 144/89 mmHg (03/24 0500) Pulse Rate: 49 (03/24 0500)  Labs:  Recent Labs  02/04/2014 1848  01/18/2014 2156  01/30/2014 2254 02/09/14 0046 02/09/14 0246 02/09/14 0400 02/09/14 0451 02/09/14 0500  HGB 14.1  < >  --   < >  --  15.0 15.0  --  14.3  --   HCT 44.4  < >  --   < >  --  44.0 44.0  --  42.0  --   PLT 118*  --   --   --   --   --   --   --   --   --   APTT  --   --   --   --  85*  --   --   --   --  83*  LABPROT  --   --   --   --  17.6*  --   --   --   --  18.4*  INR  --   --   --   --  1.49  --   --   --   --  1.58*  HEPARINUNFRC  --   --   --   --   --   --   --  0.19*  --   --   CREATININE 3.12*  < >  --   < >  --  3.60* 3.80*  --  4.00*  --   TROPONINI  --   --  1.58*  --   --   --   --  2.06*  --   --   < > = values in this interval not displayed.  Estimated Creatinine Clearance: 19.5 ml/min (by C-G formula based on Cr of 4).   Assessment: 53 yo male with ACS s/p cardiac arrest, on [place] protocol, for heparin  Goal of Therapy:  Heparin level 0.3-0.7 Monitor platelets by anticoagulation protocol: Yes   Plan:  Heparin 1500 units IV bolus, then increase heparin 750 units/hr Check heparin level in 8 hours.  Geannie RisenGreg Tanav Orsak, PharmD, BCPS

## 2014-02-09 NOTE — Progress Notes (Signed)
Hypoglycemic Event  CBG:62  Treatment: 1/2 Amp Dextrose  Symptoms: N/A  Follow-up CBG: ZOXW:[id number] CBG Result:119  Possible Reasons for Event: NPO  Comments/MD notified:Dr. Molli KnockYacoub aware    Ryan Winters, Ryan Winters  Remember to initiate Hypoglycemia Order Set & complete

## 2014-02-09 NOTE — Progress Notes (Signed)
PULMONARY / CRITICAL CARE MEDICINE   Name: Ryan Winters MRN: [id number] DOB: [birth year]    ADMISSION DATE:  02/06/2014  REFERRING MD :  Dr. Rhunette CroftNanavati  PRIMARY SERVICE: PCCM  CHIEF COMPLAINT:  Cardiac Arrest   BRIEF PATIENT DESCRIPTION: 53 y/o M admitted on 3/23 after witnessed cardiac arrest at home.  Greater than 20 min CPR at home & approx 20 min in ER (arrest after arrival).    SIGNIFICANT EVENTS / STUDIES:  3/23 - admit with witnessed cardiac arrest.  Initial rhythm per EMS was asystole, had return of shockable rhythm, epi x 2 - total of ~ 20 min.  Tx to ER, upon arrival to ER was profoundly bradhycardic with 2nd arrest.  Bicarb x2, epi x3 with approx 20 min of CPR.  ER eval noted UDS + for cocaine.   3/23 - ECHO>>>  LINES / TUBES: OETT 3/23>>> L Rad Aline 3/23>>> L IJ TLC 3/23>>>  CULTURES: None  ANTIBIOTICS: None  SUBJECTIVE: No events since last arrest  VITAL SIGNS: Temp:  [90.3 F (32.4 C)-91.8 F (33.2 C)] 91.4 F (33 C) (03/24 1000) Pulse Rate:  [48-77] 67 (03/24 1147) Resp:  [18-29] 22 (03/24 1147) BP: (64-211)/(46-117) 152/92 mmHg (03/24 1147) SpO2:  [92 %-100 %] 99 % (03/24 1147) Arterial Line BP: (145-206)/(80-103) 157/92 mmHg (03/24 1000) FiO2 (%):  [40 %-100 %] 40 % (03/24 0730) Weight:  [160 lb 12.5 oz (72.93 kg)-168 lb (76.204 kg)] 160 lb 12.5 oz (72.93 kg) (03/24 0500)  HEMODYNAMICS: CVP:  [15 mmHg-27 mmHg] 27 mmHg  VENTILATOR SETTINGS: Vent Mode:  [-] PRVC FiO2 (%):  [40 %-100 %] 40 % Set Rate:  [18 bmp-22 bmp] 22 bmp Vt Set:  [550 mL] 550 mL PEEP:  [8 cmH20-10 cmH20] 8 cmH20 Plateau Pressure:  [25 cmH20-26 cmH20] 26 cmH20  INTAKE / OUTPUT: Intake/Output     03/23 0701 - 03/24 0700 03/24 0701 - 03/25 0700   I.V. (mL/kg) 2943.1 (40.4) 139.2 (1.9)   NG/GT 30    IV Piggyback 160 100   Total Intake(mL/kg) 3133.1 (43) 239.2 (3.3)   Urine (mL/kg/hr) 300 30 (0.1)   Total Output 300 30   Net +2833.1 +209.2          PHYSICAL  EXAMINATION: General:  wdwn adult male on vent Neuro:  Pupils 4mm NR, no gag or cough, no spontaneous movement, no response to painful stimuli HEENT:  Mm pink/moist, OETT, jvd noted Cardiovascular:  s1s2 rrr, brady Lungs:  resp's even/non-labored, lungs bilaterally clear Abdomen:  Flat, soft, bsx4 active Musculoskeletal:  No acute deformities Skin:  Warm/dry, no edema, ? LUE fistula  LABS:  CBC  Recent Labs Lab 02/03/2014 1848  02/09/14 0451 02/09/14 0530 02/09/14 0859  WBC 14.7*  --   --  17.7*  --   HGB 14.1  < > 14.3 13.5 14.6  HCT 44.4  < > 42.0 39.2 43.0  PLT 118*  --   --  113*  --   < > = values in this interval not displayed. Coag's  Recent Labs Lab 01/20/2014 2254 02/09/14 0500  APTT 85* 83*  INR 1.49 1.58*   BMET  Recent Labs Lab 01/25/2014 1848  02/09/14 0400 02/09/14 0451 02/09/14 0859  NA 141  < > 145 143 144  K 5.0  < > 3.4* 3.2* 4.2  CL 103  < > 108 114* 110  CO2 15*  --  19  --   --   BUN 36*  < > 46*  42* 45*  CREATININE 3.12*  < > 3.60* 4.00* 4.20*  GLUCOSE 172*  < > 77 81 119*  < > = values in this interval not displayed. Electrolytes  Recent Labs Lab 02/11/2014 1848 02/09/14 0400  CALCIUM 11.0* 9.7   Sepsis Markers  Recent Labs Lab 02/03/2014 1857  LATICACIDVEN 11.07*   ABG  Recent Labs Lab 02/02/2014 2053 02/09/14 0446  PHART 7.115* 7.423  PCO2ART 56.7* 32.4*  PO2ART 142.0* 138.0*   Liver Enzymes  Recent Labs Lab 02/01/2014 1848  AST 39*  ALT 20  ALKPHOS 159*  BILITOT 0.7  ALBUMIN 3.1*   Cardiac Enzymes  Recent Labs Lab 01/23/2014 2156 02/09/14 0400 02/09/14 0940  TROPONINI 1.58* 2.06* 1.16*   Glucose  Recent Labs Lab 02/09/14 0137 02/09/14 0237 02/09/14 0346 02/09/14 0445 02/09/14 0537 02/09/14 0716  GLUCAP 184* 134* 76 82 79 62*    Imaging Dg Chest Port 1 View  02/09/2014   CLINICAL DATA:  Cardiac arrest.  EXAM: PORTABLE CHEST - 1 VIEW  COMPARISON:  01/22/2014  FINDINGS: Endotracheal tube, NG tube, and  central venous catheter appear in good position. Pulmonary edema on the left has almost resolved. Pulmonary edema on the right has improved slightly.  Persistent cardiomegaly.  IMPRESSION: Improving pulmonary edema.   Electronically Signed   By: Geanie Cooley M.D.   On: 02/09/2014 07:08   Dg Chest Port 1 View  02/15/2014   CLINICAL DATA:  Post left central line  EXAM: PORTABLE CHEST - 1 VIEW  COMPARISON:  Prior film same day  FINDINGS: Cardiomediastinal silhouette is stable. Status post median sternotomy. Stable NG tube and endotracheal tube position. There is left IJ central line with tip in SVC. No diagnostic pneumothorax. Hazy interstitial prominence and airspace disease bilaterally suspicious for interstitial edema or bilateral pneumonia.  IMPRESSION: Status post median sternotomy. Stable NG tube and endotracheal tube position. There is left IJ central line with tip in SVC. No diagnostic pneumothorax. Hazy interstitial prominence and airspace disease bilaterally suspicious for interstitial edema or bilateral pneumonia.   Electronically Signed   By: Natasha Mead M.D.   On: 01/29/2014 20:54   Dg Chest Port 1 View  01/23/2014   CLINICAL DATA:  Endotracheal tube placement  EXAM: PORTABLE CHEST - 1 VIEW  COMPARISON:  01/16/2014  FINDINGS: Cardiomegaly again noted. Status post median sternotomy and cardiac valve replacement. NG tube in place. Endotracheal tube in place with tip 3 cm above the carina. No diagnostic pneumothorax. There is hazy airspace is an interstitial prominence bilaterally highly suspicious for bilateral pulmonary edema rather than bilateral pneumonia.  IMPRESSION: Endotracheal tube in place. No diagnostic pneumothorax. NG tube in place. Hazy airspace disease and interstitial prominence bilaterally suspicious for bilateral pulmonary edema rather than pneumonia. Clinical correlation is necessary.   Electronically Signed   By: Natasha Mead M.D.   On: 01/22/2014 20:50    ASSESSMENT /  PLAN:  PULMONARY A: Acute Respiratory Failure - in setting of cardiac arrest Respiratory Acidosis P:   - Full vent support, adjust vent settings 550 / 22 / 8 / 100% - Wean PEEP / FiO2 for sats > 93% - F/u CXR, ABG in AM. - PRN albuterol  - Sedation per hypothermia protocol   CARDIOVASCULAR A:  Cardiac Arrest - initial rhythm asystole, approx 40 min total CPR, Cocaine POSITIVE on admit.  ECHO 03/2013 with EF 50-55%, nml wall motion, PAS 46 Bradycardia  Shock - post arrest CHF Hx MVR / TVR - ? Tissue valves  Cocaine Abuse Hx Malignant HTN Hx Atrial Flutter P:  - Hypothermia protocol, begin rewarming tonight. - Avoid beta blocker with acute cocaine ingestion  - Levophed / dopamine for MAP > 85, HR >55 - Appreciate Cardiology evaluation   RENAL A:   Lactic acidosis CKD - baseline cr ~ 3.1, followed by Dr. Arrie Aran Hyperkalemia in setting of acidosis P:   - Q2 BMP per protocol - Replace lytes as indicated - No further K replacement  GASTROINTESTINAL A:   NPO P:   - Protonix for SUP - Consider TF in am after paralytics are off pending neuro evaluation.  HEMATOLOGIC A:   Thrombocytopenia  P:  - Monitor platelets, bleeding - Heprain gtt per Cardiology   INFECTIOUS A:   No evidence of acute infectious process P:   - Monitor fever curve / leukocytosis  ENDOCRINE A:   Hyperglycemia   P:   - SSI  NEUROLOGIC A:   Bipolar Disorder Schizophrenia Peripheral Neuropathy Induced Hypothermia  P:   - Sedation / paralytics per hypothermia protocol. - Head CT and EEG in AM after sedation/paralytics are off.  Spoke with wife and two nieces bedside, explained at length details of case and concern for anoxic injury given last night's events.  After discussion, decision was made to make patient LCB with no CPR/cardioversion.  If neuro status is poor will likely proceed with comfort but they would like a little more time to contemplate the issue.  I have personally  obtained a history, examined the patient, evaluated laboratory and imaging results, formulated the assessment and plan and placed orders.  CRITICAL CARE: The patient is critically ill with multiple organ systems failure and requires high complexity decision making for assessment and support, frequent evaluation and titration of therapies, application of advanced monitoring technologies and extensive interpretation of multiple databases. Critical Care Time devoted to patient care services described in this note is 35 minutes.   Alyson Reedy, M.D. [facility] Pulmonary/Critical Care Medicine. Pager: [phone]. After hours pager: [phone].

## 2014-02-09 NOTE — Progress Notes (Signed)
eLink Physician-Brief Progress Note Patient Name: Ryan Winters DOB: 1961-04-10 MRN: [id number]  Date of Service  02/09/2014   HPI/Events of Note   BMET    Component Value Date/Time   NA 139 02/09/2014 1600   K 6.2* 02/09/2014 1600   CL 106 02/09/2014 1600   CO2 17* 02/09/2014 1600   GLUCOSE 92 02/09/2014 1600   BUN 58* 02/09/2014 1600   CREATININE 4.05* 02/09/2014 1600   CALCIUM 8.9 02/09/2014 1600   GFRNONAA 16* 02/09/2014 1600   GFRAA 18* 02/09/2014 1600    CBG (last 3)   Recent Labs  02/09/14 1523 02/09/14 1604 02/09/14 1820  GLUCAP 82 87 66*      eICU Interventions   Will give IV Ca gluconate, bicarbonate.  Will add dextrose to IV fluid.    Intervention Category Major Interventions: Electrolyte abnormality - evaluation and management Minor Interventions: Other:  [name] 02/09/2014, 6:38 PM

## 2014-02-09 NOTE — Progress Notes (Signed)
INITIAL NUTRITION ASSESSMENT  DOCUMENTATION CODES Per approved criteria  -Not Applicable   INTERVENTION:  If TF started, recommend initiate Vital 1.5 formula at 20 ml/hr and increase by 10 ml every 4 hours to goal rate of 50 ml/hr with Prostat liquid protein 30 ml BID via tube  Total TF regimen to provide 2000 kcals, 111 gm protein, 920 ml of free water RD to follow for nutrition care plan  NUTRITION DIAGNOSIS: Inadequate oral intake related to inability to eat as evidenced by NPO status   Goal: Pt to meet >/= 90% of their estimated nutrition needs   Monitor:  TF initiation, respiratory status, weight, labs, I/O's  Reason for Assessment: VDRF, Low Braden  53 y.o. male  Admitting Dx: cardiac arrest  ASSESSMENT: 53 y/o M admitted after witnessed cardiac arrest at home. Greater than 20 min CPR at home & approx 20 min in ER (arrest after arrival). ER eval noted UDS + for cocaine  Patient is currently intubated on ventilator support -- OGT in place MV: 11.6 L/min Temp (24hrs), Avg:91.2 F (32.9 C), Min:90.3 F (32.4 C), Max:91.8 F (33.2 C)   Low braden score places patient at risk for skin breakdown.  Patient is currently on Hypothermia Protocol.  + Nimbex.   If prolonged intubation expected, recommend nutrition support initiation within next 24-48 hours.  Height: Ht Readings from Last 1 Encounters:  02/03/2014 5\' 6"  (1.676 m)    Weight: Wt Readings from Last 1 Encounters:  02/09/14 160 lb 12.5 oz (72.93 kg)    Ideal Body Weight: 142 lb  % Ideal Body Weight: 112%  Wt Readings from Last 10 Encounters:  02/09/14 160 lb 12.5 oz (72.93 kg)  01/26/14 168 lb 1.9 oz (76.259 kg)  01/17/14 164 lb 6.2 oz (74.566 kg)  12/12/13 156 lb 8.4 oz (71 kg)  11/27/13 161 lb (73.029 kg)  11/21/13 162 lb 0.6 oz (73.5 kg)  10/14/13 156 lb (70.761 kg)  09/17/13 164 lb 4.8 oz (74.526 kg)  09/14/13 158 lb 11.7 oz (72 kg)  09/07/13 164 lb (74.39 kg)    Usual Body Weight: 168  lb  % Usual Body Weight: 95%  BMI:  Body mass index is 25.96 kg/(m^2).  Estimated Nutritional Needs: Kcal: 2000-2200 Protein: 110-120 gm Fluid: per MD  Skin: Intact  Diet Order: NPO  EDUCATION NEEDS: -No education needs identified at this time   Intake/Output Summary (Last 24 hours) at 02/09/14 1112 Last data filed at 02/09/14 0900  Gross per 24 hour  Intake 3372.31 ml  Output    330 ml  Net 3042.31 ml    Labs:   Recent Labs Lab 01/25/2014 1848  02/09/14 0400 02/09/14 0451 02/09/14 0859  NA 141  < > 145 143 144  K 5.0  < > 3.4* 3.2* 4.2  CL 103  < > 108 114* 110  CO2 15*  --  19  --   --   BUN 36*  < > 46* 42* 45*  CREATININE 3.12*  < > 3.60* 4.00* 4.20*  CALCIUM 11.0*  --  9.7  --   --   GLUCOSE 172*  < > 77 81 119*  < > = values in this interval not displayed.  CBG (last 3)   Recent Labs  02/09/14 0445 02/09/14 0537 02/09/14 0716  GLUCAP 82 79 62*    Scheduled Meds: . antiseptic oral rinse  15 mL Mouth Rinse QID  . artificial tears  1 application Both Eyes 3 times per  day  . chlorhexidine  15 mL Mouth Rinse BID  . cisatracurium  0.1 mg/kg Intravenous Once  . fentaNYL  100 mcg Intravenous Once  . [START ON 02/10/2014] influenza vac split quadrivalent PF  0.5 mL Intramuscular Tomorrow-1000  . insulin aspart  0-9 Units Subcutaneous 6 times per day  . pantoprazole (PROTONIX) IV  40 mg Intravenous QHS    Continuous Infusions: . sodium chloride 40 mL/hr at 02/09/14 0300  . cisatracurium (NIMBEX) infusion 1 mcg/kg/min (02/10/2014 2200)  . DOPamine 5 mcg/kg/min (02/09/14 0100)  . fentaNYL infusion INTRAVENOUS 100 mcg/hr (02/09/14 0100)  . heparin 600 Units/hr (01/18/2014 2300)  . midazolam (VERSED) infusion 2 mg/hr (02/06/2014 2220)  . norepinephrine (LEVOPHED) Adult infusion Stopped (02/16/2014 2210)    Past Medical History  Diagnosis Date  . Hypertension   . Bipolar disorder   . Schizophrenia     h/o hospitalized inpt therapy @ the [facility]  . Valvular heart disease     a. 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 b. 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  Marland Kitchen Hyperlipidemia   . H/O: suicide attempt 1998    was hospitalized at [facility]  . Anemia     Hx of iron infusions  . Gastroparesis   . Arthritis   . Cocaine abuse     per pt none x 3 yrs however continues to "handle" it and deal it with + UDS.  Marland Kitchen Anxiety   . CHF (congestive heart failure)     a. EF 25-30% by echo 12/02/12, but 55% by TEE 12/04/12.  Marland Kitchen GERD (gastroesophageal reflux disease)   . Depression   . Vertigo   . Itchy skin   . Peripheral neuropathy     BLE  . Bilateral lower abdominal cramping   . Shortness of breath     "periodically; happens at any time" (03/30/13)  . Generalized headaches     "monthly" (03/30/2013)  . Chronic kidney disease     has fistula, not on HD; "go to [facility] q 90 days to have kidneys checked out" (03/30/2013)  . Atrial tachycardia     a. dx 11/2012: tx with amiodarone, cardioversion (discharge rhythm: likely junctional).    Past Surgical History  Procedure Laterality Date  . Mitral valve repair  05/2010  . Av fistula placement, radiocephalic Left 05/24/2011    arm; "aien't never had to use it" (03/30/2013)  . Laceration repair Right ~ 1979  . Umbilical hernia repair  04/01/2012    Procedure: HERNIA REPAIR UMBILICAL ADULT;  Surgeon: Ardeth Sportsman, MD;  Location: [facility] OR;  Service: General;  Laterality: N/A;  supra umbilical  . Tee without cardioversion  08/25/2012    Procedure: TRANSESOPHAGEAL ECHOCARDIOGRAM (TEE);  Surgeon: Wendall Stade, MD;  Location: [facility] ENDOSCOPY;  Service: Cardiovascular;  Laterality: N/A;  . Mitral valve replacement  09/23/2012    Procedure: REDO MITRAL VALVE REPLACEMENT (MVR);  Surgeon: [name], MD;  Location: [facility] OR;  Service: Open Heart Surgery;  Laterality: N/A;  Nitric Oxide  . Tricuspid valve replacement  09/23/2012    Procedure: TRICUSPID VALVE REPAIR;  Surgeon:  [name], MD;  Location: [facility] OR;  Service: Open Heart Surgery;  Laterality: N/A;  . Tee without cardioversion  12/04/2012    Procedure: TRANSESOPHAGEAL ECHOCARDIOGRAM (TEE);  Surgeon: Laurey Morale, MD;  Location: [facility] ENDOSCOPY;  Service: Cardiovascular;  Laterality: N/A;  CV 1015/vicki, dl  . Cardioversion  1/61/0960  Procedure: CARDIOVERSION;  Surgeon: Laurey Morale, MD;  Location: [facility] ENDOSCOPY;  Service: Cardiovascular;  Laterality: N/A;  . Cardiac valve replacement      mitral  . Cardiac catheterization  05/26/2010    Hattie Perch 03/30/2013    Maureen Chatters, RD, LDN Pager #: [phone] After-Hours Pager #: [phone]

## 2014-02-09 NOTE — Progress Notes (Signed)
CRITICAL VALUE ALERT  Critical value received:  Troponin 1.58  Date of notification:  02/09/2014  Time of notification:  0150  Critical value read back:yes  Nurse who received alert:  Gregor HamsBINGHAM, Garyn Waguespack  MD notified (1st page):  CCM  Time of first page:  0157  MD notified (2nd page):  Time of second page:  Responding MD:  Dr. Darrick Pennaeterding  Time MD responded:  2:11 AM

## 2014-02-09 NOTE — Progress Notes (Signed)
Echocardiogram 2D Echocardiogram has been performed.  Ryan Winters 02/09/2014, 10:43 AM

## 2014-02-09 NOTE — Progress Notes (Signed)
eLink Physician-Brief Progress Note Patient Name: Lowell Boutonntonio Platts DOB: [birth year] MRN: [id number]  Date of Service  02/09/2014   HPI/Events of Note   BMET    Component Value Date/Time   NA 140 02/09/2014 2000   K 6.0* 02/09/2014 2000   CL 106 02/09/2014 2000   CO2 18* 02/09/2014 2000   GLUCOSE 132* 02/09/2014 2000   BUN 60* 02/09/2014 2000   CREATININE 4.23* 02/09/2014 2000   CALCIUM 9.0 02/09/2014 2000   GFRNONAA 15* 02/09/2014 2000   GFRAA 17* 02/09/2014 2000      eICU Interventions   Will give additional HCO3 and calcium gluconate.  Pt is about to start rewarming, and likely will have decrease in K from this >> will f/u BMET, but defer further interventions for now.    Intervention Category Major Interventions: Electrolyte abnormality - evaluation and management  Diangelo Radel 02/09/2014, 10:00 PM

## 2014-02-10 ENCOUNTER — Inpatient Hospital Stay (HOSPITAL_COMMUNITY): Payer: Medicaid Other

## 2014-02-10 DIAGNOSIS — R569 Unspecified convulsions: Secondary | ICD-10-CM

## 2014-02-10 LAB — BASIC METABOLIC PANEL
BUN: 62 mg/dL — ABNORMAL HIGH (ref 6–23)
BUN: 64 mg/dL — ABNORMAL HIGH (ref 6–23)
BUN: 68 mg/dL — ABNORMAL HIGH (ref 6–23)
BUN: 71 mg/dL — AB (ref 6–23)
CALCIUM: 8.9 mg/dL (ref 8.4–10.5)
CO2: 19 mEq/L (ref 19–32)
CO2: 19 mEq/L (ref 19–32)
CO2: 19 mEq/L (ref 19–32)
CO2: 19 meq/L (ref 19–32)
CREATININE: 4.77 mg/dL — AB (ref 0.50–1.35)
Calcium: 9.1 mg/dL (ref 8.4–10.5)
Calcium: 8.6 mg/dL (ref 8.4–10.5)
Calcium: 8.5 mg/dL (ref 8.4–10.5)
Chloride: 106 mEq/L (ref 96–112)
Chloride: 105 mEq/L (ref 96–112)
Chloride: 106 mEq/L (ref 96–112)
Chloride: 105 mEq/L (ref 96–112)
Creatinine, Ser: 4.36 mg/dL — ABNORMAL HIGH (ref 0.50–1.35)
Creatinine, Ser: 4.55 mg/dL — ABNORMAL HIGH (ref 0.50–1.35)
Creatinine, Ser: 5.23 mg/dL — ABNORMAL HIGH (ref 0.50–1.35)
GFR calc Af Amer: 15 mL/min — ABNORMAL LOW (ref >90)
GFR calc non Af Amer: 14 mL/min — ABNORMAL LOW (ref >90)
GFR calc non Af Amer: 13 mL/min — ABNORMAL LOW (ref >90)
GFR, EST AFRICAN AMERICAN: 13 mL/min — AB (ref >90)
GFR, EST AFRICAN AMERICAN: 16 mL/min — AB (ref >90)
GFR, EST AFRICAN AMERICAN: 17 mL/min — AB (ref >90)
GFR, EST NON AFRICAN AMERICAN: 14 mL/min — AB (ref >90)
GFR, EST NON AFRICAN AMERICAN: 11 mL/min — AB (ref >90)
GLUCOSE: 103 mg/dL — AB (ref 70–99)
Glucose, Bld: 93 mg/dL (ref 70–99)
Glucose, Bld: 91 mg/dL (ref 70–99)
Glucose, Bld: 106 mg/dL — ABNORMAL HIGH (ref 70–99)
POTASSIUM: 5.1 meq/L (ref 3.7–5.3)
Potassium: 4.9 mEq/L (ref 3.7–5.3)
Potassium: 5 mEq/L (ref 3.7–5.3)
Potassium: 5.4 mEq/L — ABNORMAL HIGH (ref 3.7–5.3)
SODIUM: 143 meq/L (ref 137–147)
Sodium: 144 mEq/L (ref 137–147)
Sodium: 144 mEq/L (ref 137–147)
Sodium: 143 mEq/L (ref 137–147)

## 2014-02-10 LAB — MAGNESIUM: MAGNESIUM: 2.1 mg/dL (ref 1.5–2.5)

## 2014-02-10 LAB — BLOOD GAS, ARTERIAL
Acid-base deficit: 4.8 mmol/L — ABNORMAL HIGH (ref 0.0–2.0)
Bicarbonate: 19.7 meq/L — ABNORMAL LOW (ref 20.0–24.0)
FIO2: 0.4 %
MECHVT: 550 mL
O2 Saturation: 99.3 %
PEEP: 8 cmH2O
Patient temperature: 94.6
RATE: 22 {breaths}/min
TCO2: 20.8 mmol/L (ref 0–100)
pCO2 arterial: 31.9 mmHg — ABNORMAL LOW (ref 35.0–45.0)
pH, Arterial: 7.395 (ref 7.350–7.450)
pO2, Arterial: 143 mmHg — ABNORMAL HIGH (ref 80.0–100.0)

## 2014-02-10 LAB — CBC
HCT: 33.9 % — ABNORMAL LOW (ref 39.0–52.0)
Hemoglobin: 11.7 g/dL — ABNORMAL LOW (ref 13.0–17.0)
MCH: 28 pg (ref 26.0–34.0)
MCHC: 34.5 g/dL (ref 30.0–36.0)
MCV: 81.1 fL (ref 78.0–100.0)
Platelets: 85 K/uL — ABNORMAL LOW (ref 150–400)
RBC: 4.18 MIL/uL — ABNORMAL LOW (ref 4.22–5.81)
RDW: 17.9 % — ABNORMAL HIGH (ref 11.5–15.5)
WBC: 14.9 K/uL — ABNORMAL HIGH (ref 4.0–10.5)

## 2014-02-10 LAB — GLUCOSE, CAPILLARY
GLUCOSE-CAPILLARY: 102 mg/dL — AB (ref 70–99)
GLUCOSE-CAPILLARY: 128 mg/dL — AB (ref 70–99)
Glucose-Capillary: 102 mg/dL — ABNORMAL HIGH (ref 70–99)
Glucose-Capillary: 117 mg/dL — ABNORMAL HIGH (ref 70–99)
Glucose-Capillary: 79 mg/dL (ref 70–99)
Glucose-Capillary: 81 mg/dL (ref 70–99)
Glucose-Capillary: 87 mg/dL (ref 70–99)

## 2014-02-10 LAB — HEPARIN LEVEL (UNFRACTIONATED): Heparin Unfractionated: 0.26 IU/mL — ABNORMAL LOW (ref 0.30–0.70)

## 2014-02-10 LAB — PHOSPHORUS: Phosphorus: 5.8 mg/dL — ABNORMAL HIGH (ref 2.3–4.6)

## 2014-02-10 MED ORDER — SODIUM CHLORIDE 0.9 % IV SOLN
500.0000 mg | Freq: Two times a day (BID) | INTRAVENOUS | Status: DC
  Administered 2014-02-10 – 2014-02-11 (×3): 500 mg via INTRAVENOUS
  Filled 2014-02-10 (×5): qty 5

## 2014-02-10 MED ORDER — VALPROATE SODIUM 500 MG/5ML IV SOLN
1500.0000 mg | Freq: Once | INTRAVENOUS | Status: AC
  Administered 2014-02-10: 1500 mg via INTRAVENOUS
  Filled 2014-02-10: qty 15

## 2014-02-10 MED ORDER — NOREPINEPHRINE BITARTRATE 1 MG/ML IJ SOLN
30.0000 ug/min | INTRAVENOUS | Status: DC
  Administered 2014-02-11: 18 ug/min via INTRAVENOUS
  Filled 2014-02-10 (×2): qty 16

## 2014-02-10 MED ORDER — ROCURONIUM BROMIDE 50 MG/5ML IV SOLN
INTRAVENOUS | Status: AC
  Filled 2014-02-10: qty 2

## 2014-02-10 MED ORDER — MIDAZOLAM HCL 2 MG/2ML IJ SOLN
5.0000 mg | Freq: Once | INTRAMUSCULAR | Status: AC
  Administered 2014-02-10: 5 mg via INTRAVENOUS

## 2014-02-10 MED ORDER — PROPOFOL 10 MG/ML IV EMUL
INTRAVENOUS | Status: AC
  Filled 2014-02-10: qty 100

## 2014-02-10 MED ORDER — SODIUM CHLORIDE 0.9 % IV SOLN
0.0300 [IU]/min | INTRAVENOUS | Status: DC
  Administered 2014-02-10 – 2014-02-11 (×2): 0.03 [IU]/min via INTRAVENOUS
  Filled 2014-02-10 (×2): qty 2.5

## 2014-02-10 MED ORDER — ETOMIDATE 2 MG/ML IV SOLN
INTRAVENOUS | Status: AC
  Filled 2014-02-10: qty 20

## 2014-02-10 MED ORDER — SODIUM CHLORIDE 0.9 % IV SOLN
20.0000 mg/h | INTRAVENOUS | Status: DC
  Filled 2014-02-10 (×2): qty 10

## 2014-02-10 MED ORDER — PROPOFOL 10 MG/ML IV EMUL
120.0000 ug/kg/min | INTRAVENOUS | Status: DC
  Administered 2014-02-10 – 2014-02-11 (×16): 120 ug/kg/min via INTRAVENOUS
  Filled 2014-02-10 (×11): qty 100
  Filled 2014-02-10: qty 200
  Filled 2014-02-10 (×2): qty 100
  Filled 2014-02-10: qty 200
  Filled 2014-02-10 (×5): qty 100
  Filled 2014-02-10: qty 200
  Filled 2014-02-10 (×4): qty 100

## 2014-02-10 MED ORDER — PROPOFOL 10 MG/ML IV BOLUS
2.2500 mg/kg | Freq: Once | INTRAVENOUS | Status: AC
  Administered 2014-02-10: 167.2 mg via INTRAVENOUS
  Filled 2014-02-10: qty 20

## 2014-02-10 MED ORDER — PROPOFOL BOLUS VIA INFUSION
0.5000 mg/kg | Freq: Once | INTRAVENOUS | Status: AC
  Administered 2014-02-10: 37.15 mg via INTRAVENOUS
  Filled 2014-02-10: qty 38

## 2014-02-10 MED ORDER — VITAL HIGH PROTEIN PO LIQD
1000.0000 mL | ORAL | Status: DC
  Filled 2014-02-10 (×4): qty 1000

## 2014-02-10 MED ORDER — SUCCINYLCHOLINE CHLORIDE 20 MG/ML IJ SOLN
INTRAMUSCULAR | Status: AC
  Filled 2014-02-10: qty 1

## 2014-02-10 MED ORDER — SODIUM POLYSTYRENE SULFONATE 15 GM/60ML PO SUSP
15.0000 g | Freq: Once | ORAL | Status: AC
  Administered 2014-02-10: 15 g via ORAL
  Filled 2014-02-10: qty 60

## 2014-02-10 MED ORDER — ROCURONIUM BROMIDE 50 MG/5ML IV SOLN
1.0000 mg/kg | Freq: Once | INTRAVENOUS | Status: AC
  Administered 2014-02-10: 30 mg via INTRAVENOUS

## 2014-02-10 MED ORDER — MIDAZOLAM HCL 5 MG/ML IJ SOLN: 5.0000 mg | Freq: Once | INTRAMUSCULAR | Status: DC

## 2014-02-10 MED ORDER — LIDOCAINE HCL (CARDIAC) 20 MG/ML IV SOLN
INTRAVENOUS | Status: AC
  Filled 2014-02-10: qty 5

## 2014-02-10 MED ORDER — SODIUM CHLORIDE 0.9 % IV SOLN
20.0000 mg/h | INTRAVENOUS | Status: DC
  Filled 2014-02-10: qty 20

## 2014-02-10 MED ORDER — NOREPINEPHRINE BITARTRATE 1 MG/ML IJ SOLN
30.0000 ug/min | INTRAVENOUS | Status: DC
  Filled 2014-02-10: qty 4

## 2014-02-10 MED ORDER — PROPOFOL BOLUS VIA INFUSION
36.0000 mg/kg | Freq: Once | INTRAVENOUS | Status: DC
  Filled 2014-02-10: qty 2675

## 2014-02-10 NOTE — Consult Note (Signed)
Neurology Consultation Reason for Consult: Seizures Referring Physician: Koren BoundWesam Winters  CC: Seizures  History is obtained from: Chart review  HPI: Ryan Winters is a 53 y.o. male with a history of heart disease, suicide attempt, drug abuse who presents with cardiac arrest who underwent cooling. He had approximately 20 minutes of CPR at home and 20 minutes of CPR in the ER before return of spontaneous circulation.   On leading of his paralytic, it was seen that he was having jerking movements and therefore neurology was consulted.  ROS: Unable to obtain due to coma  Past Medical History  Diagnosis Date  . Hypertension   . Bipolar disorder   . Schizophrenia     [facility]  . Valvular heart disease     a. 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 b. 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  Marland Kitchen. Hyperlipidemia   . H/O: suicide attempt 1998    [facility]  . Anemia     Hx of iron infusions  . Gastroparesis   . Arthritis   . Cocaine abuse     per pt none x 3 yrs however continues to "handle" it and deal it with + UDS.  Marland Kitchen. Anxiety   . CHF (congestive heart failure)     a. EF 25-30% by echo 12/02/12, but 55% by TEE 12/04/12.  Marland Kitchen. GERD (gastroesophageal reflux disease)   . Depression   . Vertigo   . Itchy skin   . Peripheral neuropathy     BLE  . Bilateral lower abdominal cramping   . Shortness of breath     "periodically; happens at any time" (03/30/13)  . Generalized headaches     "monthly" (03/30/2013)  . Chronic Winters disease     has fistula, not on HD; "go to [facility] q 90 days to have kidneys checked out" (03/30/2013)  . Atrial tachycardia     a. dx 11/2012: tx with amiodarone, cardioversion (discharge rhythm: likely junctional).    Family History: Unable to obtain due to coma  Social History: Tob: Unable to obtain due to coma  Exam: Current vital signs: BP 81/44  Pulse 66   Temp(Src) 98.4 F (36.9 C) (Core (Comment))  Resp 18  Ht 5\' 6"  (1.676 m)  Wt 74.3 kg (163 lb 12.8 oz)  BMI 26.45 kg/m2  SpO2 100% Vital signs in last 24 hours: Temp:  [89.8 F (32.1 C)-99.7 F (37.6 C)] 98.4 F (36.9 C) (03/25 1200) Pulse Rate:  [37-66] 66 (03/25 1140) Resp:  [0-27] 18 (03/25 1140) BP: (81-224)/(41-130) 81/44 mmHg (03/25 1140) SpO2:  [99 %-100 %] 100 % (03/25 1140) Arterial Line BP: (70-201)/(37-106) 70/37 mmHg (03/25 1000) FiO2 (%):  [40 %] 40 % (03/25 1140) Weight:  [74.3 kg (163 lb 12.8 oz)] 74.3 kg (163 lb 12.8 oz) (03/25 0500)  General: In bed intubated CV: Regular rate and rhythm Initial exam is limited by paralytic Mental Status: Patient does not respond to verbal stimuli.  Does not respond to deep sternal rub.  Does not follow commands.  No verbalizations are noted.   Cranial Nerves: II: patient does not respond confrontation bilaterally, pupils right 3 mm, left 3 mm,and reactive bilaterally III,IV,VI: Cold caloric responses absent bilaterally. V,VII: Difficult to assess due to frequent myoclonus VIII: patient does not respond to verbal stimuli IX,X: gag reflex not performed,  XI: unable to test bilaterally due to coma XII: unable to test due  to coma  Motor: Extremities flaccid throughout.  No spontaneous movement noted.  No purposeful movements noted.  Sensory: Does not respond to noxious stimuli in any extremity.  Deep Tendon Reflexes:  Absent throughout.  Plantars: equivocal bilaterally  Cerebellar: Unable to perform due to coma  Gait: Unable to perform due to coma   On subsequent exam, he was seen to have generalized myoclonic jerks associated with generalized periodic discharges on the EEG  I have reviewed labs in epic and the results pertinent to this consultation are: Elevated creatinine   Impression: 53 year old male with a history of recent cardiac arrest in myoclonic status epilepticus. This in association with a  generalized periodic discharges seen on his EEG are suggestive of a poor prognosis. At this time, after discussion with family we will try and control the discharges, but if we see them return after light sedation the suspect his outcome will be dismal.  Recommendations: 1) propofol for burst suppression for status epilepticus 2) continue Depakote and Keppra 3) plan to light and propofol to her morning if seizures resume, do not think further therapy would be likely to change his outcome.   This patient is critically ill and at significant risk of neurological worsening, death and care requires constant monitoring of vital signs, hemodynamics,respiratory and cardiac monitoring, neurological assessment, discussion with family, other specialists and medical decision making of high complexity. I spent 60 minutes of neurocritical care time  in the care of  this patient.  Ritta Slot, MD [facility] [phone]  If 7pm- 7am, please page neurology on call as listed in AMION. 02/10/2014  3:21 PM

## 2014-02-10 NOTE — Progress Notes (Signed)
Patient Name: Ryan Winters Date of Encounter: 02/10/2014  Active Problems:   SCHIZOPHRENIA   CKD (chronic kidney disease), stage IV   Cocaine abuse   Cardiac arrest   Acute respiratory failure with hypoxia   Length of Stay: 2  SUBJECTIVE  Almost completed rewarming. Off paralytics and he is having constant myoclonus or possibly seizures. Unresponsive Almost anuric.  CURRENT MEDS . antiseptic oral rinse  15 mL Mouth Rinse QID  . artificial tears  1 application Both Eyes 3 times per day  . chlorhexidine  15 mL Mouth Rinse BID  . cisatracurium  0.1 mg/kg Intravenous Once  . fentaNYL  100 mcg Intravenous Once  . influenza vac split quadrivalent PF  0.5 mL Intramuscular Tomorrow-1000  . insulin aspart  0-9 Units Subcutaneous 6 times per day  . pantoprazole (PROTONIX) IV  40 mg Intravenous QHS  . sodium polystyrene  15 g Oral Once    OBJECTIVE   Intake/Output Summary (Last 24 hours) at 02/10/14 0919 Last data filed at 02/10/14 0900  Gross per 24 hour  Intake 2266.09 ml  Output    330 ml  Net 1936.09 ml   Filed Weights   01/29/2014 2200 02/09/14 0500 02/10/14 0500  Weight: 72.93 kg (160 lb 12.5 oz) 72.93 kg (160 lb 12.5 oz) 74.3 kg (163 lb 12.8 oz)    PHYSICAL EXAM Filed Vitals:   02/10/14 0600 02/10/14 0700 02/10/14 0749 02/10/14 0800  BP: 136/82 104/55 104/55 105/53  Pulse: 55 57 58 57  Temp: 95.4 F (35.2 C) 95.9 F (35.5 C)  96.4 F (35.8 C)  TempSrc:  Core (Comment)  Core (Comment)  Resp: 22 25 22 23   Height:      Weight:      SpO2: 100% 100% 100% 100%   General: unresponsive, myoclonus/seizures Head: no evidence of trauma, intubated Neck: normal jugular venous pulsations and no hepatojugular reflux; brisk carotid pulses without delay and no carotid bruits Chest: clear to auscultation, no signs of consolidation by percussion or palpation, normal fremitus, symmetrical and full respiratory excursions Cardiovascular: normal position and quality of the  apical impulse, regular rhythm, normal first and second heart sounds, no rubs or gallops, no murmur Abdomen: no tenderness or distention, no masses by palpation, no abnormal pulsatility or arterial bruits, normal bowel sounds, no hepatosplenomegaly Extremities: no clubbing, cyanosis or edema; 2+ radial, ulnar and brachial pulses bilaterally; 2+ right femoral, posterior tibial and dorsalis pedis pulses; 2+ left femoral, posterior tibial and dorsalis pedis pulses; no subclavian or femoral bruits Neurological: grossly nonfocal  LABS  CBC  Recent Labs  02/09/2014 1848  02/09/14 0530  02/09/14 1304 02/10/14 0420  WBC 14.7*  --  17.7*  --   --  14.9*  NEUTROABS 6.0  --   --   --   --   --   HGB 14.1  < > 13.5  < > 13.6 11.7*  HCT 44.4  < > 39.2  < > 40.0 33.9*  MCV 88.3  --  82.4  --   --  81.1  PLT 118*  --  113*  --   --  85*  < > = values in this interval not displayed. Basic Metabolic Panel  Recent Labs  02/10/14 0420 02/10/14 0800  NA 144 144  K 4.9 5.0  CL 105 106  CO2 19 19  GLUCOSE 93 91  BUN 64* 68*  CREATININE 4.55* 4.77*  CALCIUM 8.9 8.6  MG 2.1  --   PHOS 5.8*  --  Liver Function Tests  Recent Labs  02/11/2014 1848 02/09/14 1400  AST 39* 110*  ALT 20 52  ALKPHOS 159* 149*  BILITOT 0.7 2.1*  PROT 6.6 6.4  ALBUMIN 3.1* 2.9*   No results found for this basename: LIPASE, AMYLASE,  in the last 72 hours Cardiac Enzymes  Recent Labs  02/07/2014 2156 02/09/14 0400 02/09/14 0940  TROPONINI 1.58* 2.06* 1.16*    Radiology Studies Imaging results have been reviewed and Dg Chest Port 1 View  02/10/2014   CLINICAL DATA:  Endotracheal tube.  Shortness of breath.  EXAM: PORTABLE CHEST - 1 VIEW  COMPARISON:  DG CHEST 1V PORT dated 02/09/2014  FINDINGS: Endotracheal tube terminates 4.1 cm above carina. Nasogastric tube extends beyond the inferior aspect of the film. Left internal jugular line terminates at mid SVC.  Midline trachea. Mild cardiomegaly. Probable layering  small left pleural effusion. Prior median sternotomy. No pneumothorax. Minimal improvement in mild interstitial edema. Patchy left infrahilar airspace disease.  IMPRESSION: Further improvement in mild interstitial edema.  Suspect developing left infrahilar atelectasis.   Electronically Signed   By: Jeronimo Greaves M.D.   On: 02/10/2014 08:43   Dg Chest Port 1 View  02/09/2014   CLINICAL DATA:  Cardiac arrest.  EXAM: PORTABLE CHEST - 1 VIEW  COMPARISON:  01/20/2014  FINDINGS: Endotracheal tube, NG tube, and central venous catheter appear in good position. Pulmonary edema on the left has almost resolved. Pulmonary edema on the right has improved slightly.  Persistent cardiomegaly.  IMPRESSION: Improving pulmonary edema.   Electronically Signed   By: Geanie Cooley M.D.   On: 02/09/2014 07:08   Dg Chest Port 1 View  02/07/2014   CLINICAL DATA:  Post left central line  EXAM: PORTABLE CHEST - 1 VIEW  COMPARISON:  Prior film same day  FINDINGS: Cardiomediastinal silhouette is stable. Status post median sternotomy. Stable NG tube and endotracheal tube position. There is left IJ central line with tip in SVC. No diagnostic pneumothorax. Hazy interstitial prominence and airspace disease bilaterally suspicious for interstitial edema or bilateral pneumonia.  IMPRESSION: Status post median sternotomy. Stable NG tube and endotracheal tube position. There is left IJ central line with tip in SVC. No diagnostic pneumothorax. Hazy interstitial prominence and airspace disease bilaterally suspicious for interstitial edema or bilateral pneumonia.   Electronically Signed   By: Natasha Mead M.D.   On: 01/27/2014 20:54   Dg Chest Port 1 View  02/07/2014   CLINICAL DATA:  Endotracheal tube placement  EXAM: PORTABLE CHEST - 1 VIEW  COMPARISON:  01/16/2014  FINDINGS: Cardiomegaly again noted. Status post median sternotomy and cardiac valve replacement. NG tube in place. Endotracheal tube in place with tip 3 cm above the carina. No  diagnostic pneumothorax. There is hazy airspace is an interstitial prominence bilaterally highly suspicious for bilateral pulmonary edema rather than bilateral pneumonia.  IMPRESSION: Endotracheal tube in place. No diagnostic pneumothorax. NG tube in place. Hazy airspace disease and interstitial prominence bilaterally suspicious for bilateral pulmonary edema rather than pneumonia. Clinical correlation is necessary.   Electronically Signed   By: Natasha Mead M.D.   On: 01/21/2014 20:50    TELE NSR   ASSESSMENT AND PLAN  Neurological prognosis appears grim. Consider keppra or similar anticonvulsant. Hemodynamically stable. Mild increase in cardiac enzymes with near normal LVEF and absence of regional wall motion abnormalities suggests the mild cTrop I increase is due to cocaine and/or arrest. Normal coronaries in not too remote past. No plans for additional coronary  workup. Will sign off but we are available for additional questions as needed.   Thurmon FairMihai Alicha Raspberry, MD, [facility] [phone] office [phone] pager 02/10/2014 9:19 AM

## 2014-02-10 NOTE — Progress Notes (Addendum)
PULMONARY / CRITICAL CARE MEDICINE   Name: Ryan Winters MRN: [id number] DOB: [birth year]    ADMISSION DATE:  01/20/2014  REFERRING MD :  Dr. Rhunette [name]  PRIMARY SERVICE: PCCM  CHIEF COMPLAINT:  Cardiac Arrest   BRIEF PATIENT DESCRIPTION: 53 y/o M admitted on 3/23 after witnessed cardiac arrest at home.  Greater than 20 min CPR at home & approx 20 min in ER (arrest after arrival).    SIGNIFICANT EVENTS / STUDIES:  3/23 - admit with witnessed cardiac arrest.  Initial rhythm per EMS was asystole, had return of shockable rhythm, epi x 2 - total of ~ 20 min.  Tx to ER, upon arrival to ER was profoundly bradhycardic with 2nd arrest.  Bicarb x2, epi x3 with approx 20 min of CPR.  ER eval noted UDS + for cocaine.   3/23 - ECHO>>>  LINES / TUBES: OETT 3/23>>> L Rad Aline 3/23>>> L IJ TLC 3/23>>>  CULTURES: None  ANTIBIOTICS: None  SUBJECTIVE: No events overnight  VITAL SIGNS: Temp:  [89.8 F (32.1 C)-96.4 F (35.8 C)] 96.4 F (35.8 C) (03/25 0800) Pulse Rate:  [37-67] 57 (03/25 0800) Resp:  [0-25] 23 (03/25 0800) BP: (92-224)/(46-130) 105/53 mmHg (03/25 0800) SpO2:  [99 %-100 %] 100 % (03/25 0800) Arterial Line BP: (104-201)/(50-106) 109/50 mmHg (03/25 0800) FiO2 (%):  [40 %] 40 % (03/25 0749) Weight:  [163 lb 12.8 oz (74.3 kg)] 163 lb 12.8 oz (74.3 kg) (03/25 0500)  HEMODYNAMICS: CVP:  [15 mmHg-20 mmHg] 15 mmHg  VENTILATOR SETTINGS: Vent Mode:  [-] PRVC FiO2 (%):  [40 %] 40 % Set Rate:  [22 bmp] 22 bmp Vt Set:  [550 mL] 550 mL PEEP:  [8 cmH20] 8 cmH20 Plateau Pressure:  [22 cmH20-28 cmH20] 22 cmH20  INTAKE / OUTPUT: Intake/Output     03/24 0701 - 03/25 0700 03/25 0701 - 03/26 0700   I.V. (mL/kg) 2000.4 (26.9) 94.9 (1.3)   NG/GT 30    IV Piggyback 320    Total Intake(mL/kg) 2350.4 (31.6) 94.9 (1.3)   Urine (mL/kg/hr) 160 (0.1)    Emesis/NG output 200 (0.1)    Total Output 360 0   Net +1990.4 +94.9         PHYSICAL EXAMINATION: General:  wdwn adult male on  vent Neuro:  Pupils 4mm NR, no gag or cough, no spontaneous movement, no response to painful stimuli HEENT:  Mm pink/moist, OETT, jvd noted Cardiovascular:  s1s2 rrr, brady Lungs:  resp's even/non-labored, lungs bilaterally clear Abdomen:  Flat, soft, bsx4 active Musculoskeletal:  No acute deformities Skin:  Warm/dry, no edema, ? LUE fistula  LABS:  CBC  Recent Labs Lab 02/09/2014 1848  02/09/14 0530 02/09/14 0859 02/09/14 1304 02/10/14 0420  WBC 14.7*  --  17.7*  --   --  14.9*  HGB 14.1  < > 13.5 14.6 13.6 11.7*  HCT 44.4  < > 39.2 43.0 40.0 33.9*  PLT 118*  --  113*  --   --  85*  < > = values in this interval not displayed. Coag's  Recent Labs Lab 02/02/2014 2254 02/09/14 0500  APTT 85* 83*  INR 1.49 1.58*   BMET  Recent Labs Lab 02/10/14 02/10/14 0420 02/10/14 0800  NA 143 144 144  K 5.1 4.9 5.0  CL 106 105 106  CO2 19 19 19   BUN 62* 64* 68*  CREATININE 4.36* 4.55* 4.77*  GLUCOSE 103* 93 91   Electrolytes  Recent Labs Lab 02/10/14 02/10/14 0420 02/10/14 0800  CALCIUM 9.1 8.9 8.6  MG  --  2.1  --   PHOS  --  5.8*  --    Sepsis Markers  Recent Labs Lab 01/19/2014 1857  LATICACIDVEN 11.07*   ABG  Recent Labs Lab 02/02/2014 2053 02/09/14 0446 02/10/14 0432  PHART 7.115* 7.423 7.395  PCO2ART 56.7* 32.4* 31.9*  PO2ART 142.0* 138.0* 143.0*   Liver Enzymes  Recent Labs Lab 02/07/2014 1848 02/09/14 1400  AST 39* 110*  ALT 20 52  ALKPHOS 159* 149*  BILITOT 0.7 2.1*  ALBUMIN 3.1* 2.9*   Cardiac Enzymes  Recent Labs Lab 01/19/2014 2156 02/09/14 0400 02/09/14 0940  TROPONINI 1.58* 2.06* 1.16*   Glucose  Recent Labs Lab 02/09/14 1820 02/09/14 1952 02/09/14 2218 02/10/14 0028 02/10/14 0415 02/10/14 0723  GLUCAP 66* 138* 115* 79 87 81    Imaging Dg Chest Port 1 View  02/10/2014   CLINICAL DATA:  Endotracheal tube.  Shortness of breath.  EXAM: PORTABLE CHEST - 1 VIEW  COMPARISON:  DG CHEST 1V PORT dated 02/09/2014  FINDINGS:  Endotracheal tube terminates 4.1 cm above carina. Nasogastric tube extends beyond the inferior aspect of the film. Left internal jugular line terminates at mid SVC.  Midline trachea. Mild cardiomegaly. Probable layering small left pleural effusion. Prior median sternotomy. No pneumothorax. Minimal improvement in mild interstitial edema. Patchy left infrahilar airspace disease.  IMPRESSION: Further improvement in mild interstitial edema.  Suspect developing left infrahilar atelectasis.   Electronically Signed   By: Jeronimo GreavesKyle  Talbot M.D.   On: 02/10/2014 08:43   Dg Chest Port 1 View  02/09/2014   CLINICAL DATA:  Cardiac arrest.  EXAM: PORTABLE CHEST - 1 VIEW  COMPARISON:  01/31/2014  FINDINGS: Endotracheal tube, NG tube, and central venous catheter appear in good position. Pulmonary edema on the left has almost resolved. Pulmonary edema on the right has improved slightly.  Persistent cardiomegaly.  IMPRESSION: Improving pulmonary edema.   Electronically Signed   By: Geanie CooleyJim  Maxwell M.D.   On: 02/09/2014 07:08   Dg Chest Port 1 View  01/25/2014   CLINICAL DATA:  Post left central line  EXAM: PORTABLE CHEST - 1 VIEW  COMPARISON:  Prior film same day  FINDINGS: Cardiomediastinal silhouette is stable. Status post median sternotomy. Stable NG tube and endotracheal tube position. There is left IJ central line with tip in SVC. No diagnostic pneumothorax. Hazy interstitial prominence and airspace disease bilaterally suspicious for interstitial edema or bilateral pneumonia.  IMPRESSION: Status post median sternotomy. Stable NG tube and endotracheal tube position. There is left IJ central line with tip in SVC. No diagnostic pneumothorax. Hazy interstitial prominence and airspace disease bilaterally suspicious for interstitial edema or bilateral pneumonia.   Electronically Signed   By: Natasha MeadLiviu  Pop M.D.   On: 02/04/2014 20:54   Dg Chest Port 1 View  02/01/2014   CLINICAL DATA:  Endotracheal tube placement  EXAM: PORTABLE CHEST -  1 VIEW  COMPARISON:  01/16/2014  FINDINGS: Cardiomegaly again noted. Status post median sternotomy and cardiac valve replacement. NG tube in place. Endotracheal tube in place with tip 3 cm above the carina. No diagnostic pneumothorax. There is hazy airspace is an interstitial prominence bilaterally highly suspicious for bilateral pulmonary edema rather than bilateral pneumonia.  IMPRESSION: Endotracheal tube in place. No diagnostic pneumothorax. NG tube in place. Hazy airspace disease and interstitial prominence bilaterally suspicious for bilateral pulmonary edema rather than pneumonia. Clinical correlation is necessary.   Electronically Signed   By: Lanette HampshireLiviu  Pop M.D.  On: 02/11/2014 20:50   ASSESSMENT / PLAN:  PULMONARY A: Acute Respiratory Failure - in setting of cardiac arrest Respiratory Acidosis P:   - Full vent support, adjust vent settings 550 / 22 / 8 / 100% - Wean PEEP / FiO2 for sats > 93% - F/u CXR, ABG in AM. - PRN albuterol  - Sedation per hypothermia protocol   CARDIOVASCULAR A:  Cardiac Arrest - initial rhythm asystole, approx 40 min total CPR, Cocaine POSITIVE on admit.  ECHO 03/2013 with EF 50-55%, nml wall motion, PAS 46 Bradycardia  Shock - post arrest CHF Hx MVR / TVR - ? Tissue valves Cocaine Abuse Hx Malignant HTN Hx Atrial Flutter P:  - Hypothermia protocol, begin rewarming tonight. - Avoid beta blocker with acute cocaine ingestion  - Levophed / dopamine for MAP > 85, HR >55 until warmed then change to 65 mmHg. - Appreciate Cardiology evaluation   RENAL A:   Lactic acidosis CKD - baseline cr ~ 3.1, followed by Dr. Arrie Aran Hyperkalemia in setting of acidosis, virtually aneuric at this point, K down to 5.0 Essentially aneuric at this point. P:   - Q2 BMP per protocol - Replace lytes as indicated - Kayexalate x1 as K will increase with warming. - Will not involve renal until neuro status is evaluated post warming.  GASTROINTESTINAL A:   NPO P:   -  Protonix for SUP - Start TF.  HEMATOLOGIC A:   Thrombocytopenia  P:  - Monitor platelets, bleeding - Heprain gtt per Cardiology   INFECTIOUS A:   No evidence of acute infectious process P:   - Monitor fever curve / leukocytosis  ENDOCRINE A:   Hyperglycemia   P:   - SSI  NEUROLOGIC A:   Bipolar Disorder Schizophrenia Peripheral Neuropathy Induced Hypothermia  P:   - Sedation / paralytics per hypothermia protocol. - Head CT and EEG in AM after sedation/paralytics are off.  Currently LCB, no family bedside, after paralytics and cooling are complete will need head CT and EEG for evaluation of neuro function.  I have personally obtained a history, examined the patient, evaluated laboratory and imaging results, formulated the assessment and plan and placed orders.  CRITICAL CARE: The patient is critically ill with multiple organ systems failure and requires high complexity decision making for assessment and support, frequent evaluation and titration of therapies, application of advanced monitoring technologies and extensive interpretation of multiple databases. Critical Care Time devoted to patient care services described in this note is 35 minutes.   Alyson Reedy, M.D. [facility] Pulmonary/Critical Care Medicine. Pager: [phone]. After hours pager: [phone].

## 2014-02-10 NOTE — Progress Notes (Signed)
Called by bedside RN for seizure activity, continue versed, load with keppra, EEG ordered and neurology called.

## 2014-02-10 NOTE — Progress Notes (Signed)
NUTRITION FOLLOW UP  Intervention:   1.  Enteral nutrition; initiate Vital HP @ 20 mL/hr continuous.  Advance by 10 mL q 4 hrs to 65 mL/hr goal to provide 1560 kcal, 136g protein, 1294 mL free water.  Nutrition Dx:   Inadequate oral intake, ongoing  Monitor:   1.  Enteral nutrition; initiation with tolerance.  Pt to meet >/=90% estimated needs with nutrition support.  2.  Wt/wt change; monitor trends  Assessment:   53 y/o M admitted after witnessed cardiac arrest at home. Greater than 20 min CPR at home & approx 20 min in ER (arrest after arrival). ER eval noted UDS + for cocaine  Patient is currently intubated on ventilator support.  MV: 12.3 L/min Temp (24hrs), Avg:92.4 F (33.6 C), Min:89.8 F (32.1 C), Max:96.4 F (35.8 C)  Propofol: none Nimbex:  none  Pt is being rewarmed. RD has been consulted for TF initiation and management.   Pt with increased seizure activity.    Height: Ht Readings from Last 1 Encounters:  02/07/2014 5\' 6"  (1.676 m)    Weight Status:   Wt Readings from Last 1 Encounters:  02/10/14 163 lb 12.8 oz (74.3 kg)    Re-estimated needs:  Kcal: 1674 Protein: 10-115 g  Fluid: >2.0 L/day  Skin: Generalized edema  Diet Order: NPO   Intake/Output Summary (Last 24 hours) at 02/10/14 0914 Last data filed at 02/10/14 0900  Gross per 24 hour  Intake 2266.09 ml  Output    330 ml  Net 1936.09 ml    Last BM: PTA  Labs:   Recent Labs Lab 02/10/14 02/10/14 0420 02/10/14 0800  NA 143 144 144  K 5.1 4.9 5.0  CL 106 105 106  CO2 19 19 19   BUN 62* 64* 68*  CREATININE 4.36* 4.55* 4.77*  CALCIUM 9.1 8.9 8.6  MG  --  2.1  --   PHOS  --  5.8*  --   GLUCOSE 103* 93 91    CBG (last 3)   Recent Labs  02/10/14 0028 02/10/14 0415 02/10/14 0723  GLUCAP 79 87 81    Scheduled Meds: . antiseptic oral rinse  15 mL Mouth Rinse QID  . artificial tears  1 application Both Eyes 3 times per day  . chlorhexidine  15 mL Mouth Rinse BID  .  cisatracurium  0.1 mg/kg Intravenous Once  . fentaNYL  100 mcg Intravenous Once  . influenza vac split quadrivalent PF  0.5 mL Intramuscular Tomorrow-1000  . insulin aspart  0-9 Units Subcutaneous 6 times per day  . pantoprazole (PROTONIX) IV  40 mg Intravenous QHS  . sodium polystyrene  15 g Oral Once    Continuous Infusions: . cisatracurium (NIMBEX) infusion Stopped (02/10/14 0850)  . dextrose 5 % and 0.9% NaCl 50 mL/hr at 02/09/14 1900  . DOPamine Stopped (02/09/14 1100)  . fentaNYL infusion INTRAVENOUS 200 mcg/hr (02/10/14 0900)  . heparin 800 Units/hr (02/10/14 0800)  . midazolam (VERSED) infusion 1 mg/hr (02/10/14 0800)  . norepinephrine (LEVOPHED) Adult infusion Stopped (02/02/2014 2210)    Loyce DysKacie Chibuike Fleek, MS RD LDN Clinical Inpatient Dietitian Pager: [phone] Weekend/After hours pager: [phone]

## 2014-02-10 NOTE — Progress Notes (Signed)
Routine EEG started; changed to LTM

## 2014-02-10 NOTE — Progress Notes (Signed)
ANTICOAGULATION CONSULT NOTE Pharmacy Consult for Heparin  Indication: ACS on [place] Sun Protocol   Allergies  Allergen Reactions  . Cashew Nut Oil Anaphylaxis  . Food Anaphylaxis    Strawberries, Cashews, causes throat to swell - affects breathing  . Strawberry Anaphylaxis    Patient Measurements: Height: 5\' 6"  (167.6 cm) Weight: 160 lb 12.5 oz (72.93 kg) IBW/kg (Calculated) : 63.8  Vital Signs: Temp: 92.1 F (33.4 C) (03/25 0000) Temp src: Core (Comment) (03/25 0000) BP: 206/100 mmHg (03/25 0000) Pulse Rate: 48 (03/25 0100)  Labs:  Recent Labs  02/02/2014 1848  02/03/2014 2156  01/31/2014 2254  02/09/14 0400  02/09/14 0500 02/09/14 0530 02/09/14 0859 02/09/14 0940 02/09/14 1304 02/09/14 1400 02/09/14 1600 02/09/14 2000 02/10/14 02/10/14 0052  HGB 14.1  < >  --   < >  --   < >  --   < >  --  13.5 14.6  --  13.6  --   --   --   --   --   HCT 44.4  < >  --   < >  --   < >  --   < >  --  39.2 43.0  --  40.0  --   --   --   --   --   PLT 118*  --   --   --   --   --   --   --   --  113*  --   --   --   --   --   --   --   --   APTT  --   --   --   --  85*  --   --   --  83*  --   --   --   --   --   --   --   --   --   LABPROT  --   --   --   --  17.6*  --   --   --  18.4*  --   --   --   --   --   --   --   --   --   INR  --   --   --   --  1.49  --   --   --  1.58*  --   --   --   --   --   --   --   --   --   HEPARINUNFRC  --   --   --   --   --   --  0.19*  --   --   --   --   --   --  0.33  --   --   --  0.26*  CREATININE 3.12*  < >  --   < >  --   < > 3.60*  < >  --   --  4.20*  --  4.10*  --  4.05* 4.23* 4.36*  --   TROPONINI  --   --  1.58*  --   --   --  2.06*  --   --   --   --  1.16*  --   --   --   --   --   --   < > = values in this interval not displayed.  Estimated Creatinine Clearance: 17.9 ml/min (by C-G formula based on Cr of 4.36).  Assessment: 53 yo male with ACS s/p cardiac arrest, on [place] protocol (rewarmign started), for heparin.    Goal  of Therapy:  Heparin level 0.3-0.7 Monitor platelets by anticoagulation protocol: Yes   Plan:  Increase Heparin 800 units/hr Check heparin level in 8 hours.   Geannie RisenGreg Tamarah Bhullar, PharmD, BCPS   02/10/2014 1:42 AM

## 2014-02-10 NOTE — Progress Notes (Signed)
Had a discussion with the wife and family members. 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 They indicated that they did not want him to continue to suffer. After discussion, decided to pursue burst suppression overnight, if no improvement by tomorrow morning, consider withdrawal of care.  Ryan SlotMcNeill Dadrian Ballantine, MD [facility] [phone]  If 7pm- 7am, please page neurology on call as listed in AMION.

## 2014-02-11 ENCOUNTER — Encounter (HOSPITAL_COMMUNITY): Payer: Self-pay | Admitting: Anesthesiology

## 2014-02-11 ENCOUNTER — Encounter (HOSPITAL_COMMUNITY): Admission: EM | Disposition: E | Payer: Self-pay | Source: Home / Self Care | Attending: Pulmonary Disease

## 2014-02-11 ENCOUNTER — Inpatient Hospital Stay (HOSPITAL_COMMUNITY): Payer: Medicaid Other

## 2014-02-11 DIAGNOSIS — Z515 Encounter for palliative care: Secondary | ICD-10-CM

## 2014-02-11 DIAGNOSIS — Z66 Do not resuscitate: Secondary | ICD-10-CM

## 2014-02-11 HISTORY — PX: ORGAN PROCUREMENT: SHX5270

## 2014-02-11 LAB — COMPREHENSIVE METABOLIC PANEL
ALT: 48 U/L (ref 0–53)
AST: 67 U/L — ABNORMAL HIGH (ref 0–37)
Albumin: 2.4 g/dL — ABNORMAL LOW (ref 3.5–5.2)
Alkaline Phosphatase: 107 U/L (ref 39–117)
BUN: 84 mg/dL — AB (ref 6–23)
CALCIUM: 7.6 mg/dL — AB (ref 8.4–10.5)
CO2: 18 meq/L — AB (ref 19–32)
CREATININE: 7.02 mg/dL — AB (ref 0.50–1.35)
Chloride: 105 mEq/L (ref 96–112)
GFR calc Af Amer: 9 mL/min — ABNORMAL LOW (ref >90)
GFR calc non Af Amer: 8 mL/min — ABNORMAL LOW (ref >90)
GLUCOSE: 120 mg/dL — AB (ref 70–99)
Potassium: 5.1 mEq/L (ref 3.7–5.3)
Sodium: 142 mEq/L (ref 137–147)
Total Bilirubin: 1.3 mg/dL — ABNORMAL HIGH (ref 0.3–1.2)
Total Protein: 5.6 g/dL — ABNORMAL LOW (ref 6.0–8.3)

## 2014-02-11 LAB — PROTIME-INR
INR: 1.24 (ref 0.00–1.49)
INR: 1.17 (ref 0.00–1.49)
PROTHROMBIN TIME: 14.7 s (ref 11.6–15.2)
Prothrombin Time: 15.3 seconds — ABNORMAL HIGH (ref 11.6–15.2)

## 2014-02-11 LAB — HEPATIC FUNCTION PANEL
ALT: 51 U/L (ref 0–53)
AST: 70 U/L — AB (ref 0–37)
Albumin: 2.4 g/dL — ABNORMAL LOW (ref 3.5–5.2)
Alkaline Phosphatase: 109 U/L (ref 39–117)
BILIRUBIN DIRECT: 0.7 mg/dL — AB (ref 0.0–0.3)
Indirect Bilirubin: 0.5 mg/dL (ref 0.3–0.9)
TOTAL PROTEIN: 5.5 g/dL — AB (ref 6.0–8.3)
Total Bilirubin: 1.2 mg/dL (ref 0.3–1.2)

## 2014-02-11 LAB — BLOOD GAS, ARTERIAL
Acid-base deficit: 5.8 mmol/L — ABNORMAL HIGH (ref 0.0–2.0)
Bicarbonate: 19.4 mEq/L — ABNORMAL LOW (ref 20.0–24.0)
FIO2: 0.4 %
LHR: 22 {breaths}/min
MECHVT: 550 mL
O2 Saturation: 99.1 %
PATIENT TEMPERATURE: 98.6
PEEP/CPAP: 8 cmH2O
PO2 ART: 142 mmHg — AB (ref 80.0–100.0)
TCO2: 20.6 mmol/L (ref 0–100)
pCO2 arterial: 40.1 mmHg (ref 35.0–45.0)
pH, Arterial: 7.306 — ABNORMAL LOW (ref 7.350–7.450)

## 2014-02-11 LAB — CBC
HCT: 29.5 % — ABNORMAL LOW (ref 39.0–52.0)
Hemoglobin: 10.2 g/dL — ABNORMAL LOW (ref 13.0–17.0)
MCH: 28.4 pg (ref 26.0–34.0)
MCHC: 34.6 g/dL (ref 30.0–36.0)
MCV: 82.2 fL (ref 78.0–100.0)
PLATELETS: 113 10*3/uL — AB (ref 150–400)
RBC: 3.59 MIL/uL — ABNORMAL LOW (ref 4.22–5.81)
RDW: 18.5 % — ABNORMAL HIGH (ref 11.5–15.5)
WBC: 16.8 10*3/uL — ABNORMAL HIGH (ref 4.0–10.5)

## 2014-02-11 LAB — BASIC METABOLIC PANEL
BUN: 80 mg/dL — AB (ref 6–23)
CALCIUM: 7.8 mg/dL — AB (ref 8.4–10.5)
CO2: 18 meq/L — AB (ref 19–32)
CREATININE: 6.36 mg/dL — AB (ref 0.50–1.35)
Chloride: 104 mEq/L (ref 96–112)
GFR calc Af Amer: 10 mL/min — ABNORMAL LOW (ref >90)
GFR calc non Af Amer: 9 mL/min — ABNORMAL LOW (ref >90)
GLUCOSE: 123 mg/dL — AB (ref 70–99)
Potassium: 5.5 mEq/L — ABNORMAL HIGH (ref 3.7–5.3)
Sodium: 141 mEq/L (ref 137–147)

## 2014-02-11 LAB — URINE MICROSCOPIC-ADD ON

## 2014-02-11 LAB — POCT I-STAT 3, ART BLOOD GAS (G3+)
Acid-base deficit: 7 mmol/L — ABNORMAL HIGH (ref 0.0–2.0)
BICARBONATE: 18.8 meq/L — AB (ref 20.0–24.0)
O2 Saturation: 98 %
PCO2 ART: 35.2 mmHg (ref 35.0–45.0)
TCO2: 20 mmol/L (ref 0–100)
pH, Arterial: 7.331 — ABNORMAL LOW (ref 7.350–7.450)
pO2, Arterial: 111 mmHg — ABNORMAL HIGH (ref 80.0–100.0)

## 2014-02-11 LAB — TYPE AND SCREEN
ABO/RH(D): A POS
ABO/RH(D): A POS
Antibody Screen: NEGATIVE
Antibody Screen: NEGATIVE
PT AG Type: POSITIVE
PT AG Type: POSITIVE

## 2014-02-11 LAB — URINALYSIS, ROUTINE W REFLEX MICROSCOPIC
Glucose, UA: 100 mg/dL — AB
Ketones, ur: NEGATIVE mg/dL
NITRITE: POSITIVE — AB
Protein, ur: >300 mg/dL — AB
SPECIFIC GRAVITY, URINE: 1.022 (ref 1.005–1.030)
UROBILINOGEN UA: 0.2 mg/dL (ref 0.0–1.0)
pH: 5 (ref 5.0–8.0)

## 2014-02-11 LAB — GLUCOSE, CAPILLARY
GLUCOSE-CAPILLARY: 123 mg/dL — AB (ref 70–99)
GLUCOSE-CAPILLARY: 116 mg/dL — AB (ref 70–99)
GLUCOSE-CAPILLARY: 113 mg/dL — AB (ref 70–99)

## 2014-02-11 LAB — PHOSPHORUS: Phosphorus: 6.2 mg/dL — ABNORMAL HIGH (ref 2.3–4.6)

## 2014-02-11 LAB — MAGNESIUM: MAGNESIUM: 2 mg/dL (ref 1.5–2.5)

## 2014-02-11 SURGERY — SURGICAL PROCUREMENT, ORGAN
Anesthesia: General | Site: Abdomen

## 2014-02-11 MED ORDER — MORPHINE SULFATE 4 MG/ML IJ SOLN
4.0000 mg | INTRAMUSCULAR | Status: DC | PRN
  Administered 2014-02-12: 4 mg via INTRAVENOUS
  Filled 2014-02-11: qty 1

## 2014-02-11 MED ORDER — ALTEPLASE 100 MG IV SOLR
2.0000 mg | Freq: Once | INTRAVENOUS | Status: DC
  Filled 2014-02-11: qty 2

## 2014-02-11 MED ORDER — HEPARIN SODIUM (PORCINE) 1000 UNIT/ML IJ SOLN
22200.0000 [IU] | Freq: Once | INTRAMUSCULAR | Status: AC
  Administered 2014-02-12: 22200 [IU] via INTRAVENOUS
  Filled 2014-02-11: qty 22.2

## 2014-02-11 SURGICAL SUPPLY — 68 items
BLADE STERNUM SYSTEM 6 (BLADE) ×2 IMPLANT
BLADE SURG 10 STRL SS (BLADE) ×2 IMPLANT
BLADE SURG ROTATE 9660 (MISCELLANEOUS) ×2 IMPLANT
CANNULA VESSEL W/WING WO/VALVE (CANNULA) IMPLANT
CLIP TI MEDIUM 24 (CLIP) ×1 IMPLANT
CLIP TI WIDE RED SMALL 24 (CLIP) ×1 IMPLANT
CONT SPEC 4OZ CLIKSEAL STRL BL (MISCELLANEOUS) ×4 IMPLANT
CONT SPEC STER OR (MISCELLANEOUS) ×12 IMPLANT
COVER MAYO STAND STRL (DRAPES) ×3 IMPLANT
COVER SURGICAL LIGHT HANDLE (MISCELLANEOUS) ×3 IMPLANT
COVER TABLE BACK 60X90 (DRAPES) IMPLANT
DRAPE PROXIMA HALF (DRAPES) IMPLANT
DRAPE SLUSH MACHINE 52X66 (DRAPES) ×3 IMPLANT
DRESSING TELFA 8X3 (GAUZE/BANDAGES/DRESSINGS) ×3 IMPLANT
DRSG COVADERM 4X10 (GAUZE/BANDAGES/DRESSINGS) ×6 IMPLANT
DRSG COVADERM 4X14 (GAUZE/BANDAGES/DRESSINGS) ×4 IMPLANT
DURAPREP 26ML APPLICATOR (WOUND CARE) ×4 IMPLANT
ELECT BLADE 6.5 EXT (BLADE) IMPLANT
ELECT REM PT RETURN 9FT ADLT (ELECTROSURGICAL) ×3
ELECTRODE REM PT RTRN 9FT ADLT (ELECTROSURGICAL) ×2 IMPLANT
GAUZE SPONGE 4X4 16PLY XRAY LF (GAUZE/BANDAGES/DRESSINGS) IMPLANT
GLOVE BIO SURGEON STRL SZ7.5 (GLOVE) ×2 IMPLANT
GLOVE BIO SURGEON STRL SZ8.5 (GLOVE) ×6 IMPLANT
GLOVE BIOGEL PI IND STRL 6.5 (GLOVE) IMPLANT
GLOVE BIOGEL PI INDICATOR 6.5 (GLOVE) ×4
GLOVE SURG SS PI 6.0 STRL IVOR (GLOVE) ×4 IMPLANT
GOWN STRL REUS W/ TWL LRG LVL3 (GOWN DISPOSABLE) ×4 IMPLANT
GOWN STRL REUS W/TWL LRG LVL3 (GOWN DISPOSABLE) ×12
KIT POST MORTEM ADULT 36X90 (BAG) ×3 IMPLANT
KIT ROOM TURNOVER OR (KITS) ×3 IMPLANT
LOOP VESSEL MAXI BLUE (MISCELLANEOUS) ×1 IMPLANT
LOOP VESSEL MINI RED (MISCELLANEOUS) ×1 IMPLANT
MANIFOLD NEPTUNE II (INSTRUMENTS) ×4 IMPLANT
NDL BIOPSY 14X6 SOFT TISS (NEEDLE) ×1 IMPLANT
NEEDLE BIOPSY 14X6 SOFT TISS (NEEDLE) ×3 IMPLANT
NS IRRIG 1000ML POUR BTL (IV SOLUTION) ×8 IMPLANT
PACK AORTA (CUSTOM PROCEDURE TRAY) ×3 IMPLANT
PAD ARMBOARD 7.5X6 YLW CONV (MISCELLANEOUS) ×6 IMPLANT
PENCIL BUTTON HOLSTER BLD 10FT (ELECTRODE) ×1 IMPLANT
SOLUTION BETADINE 4OZ (MISCELLANEOUS) ×1 IMPLANT
SPONGE INTESTINAL PEANUT (DISPOSABLE) ×2 IMPLANT
SPONGE LAP 18X18 X RAY DECT (DISPOSABLE) IMPLANT
STAPLER VISISTAT 35W (STAPLE) ×3 IMPLANT
SUCTION POOLE TIP (SUCTIONS) ×6 IMPLANT
SUT BONE WAX W31G (SUTURE) ×1 IMPLANT
SUT ETHIBOND 5 LR DA (SUTURE) ×8 IMPLANT
SUT ETHILON 1 LR 30 (SUTURE) ×3 IMPLANT
SUT ETHILON 2 LR (SUTURE) IMPLANT
SUT PROLENE 6 0 BV (SUTURE) IMPLANT
SUT SILK 0 TIES 10X30 (SUTURE) ×1 IMPLANT
SUT SILK 1 SH (SUTURE) IMPLANT
SUT SILK 1 TIES 10X30 (SUTURE) IMPLANT
SUT SILK 2 0 (SUTURE)
SUT SILK 2 0 SH (SUTURE) IMPLANT
SUT SILK 2 0 SH CR/8 (SUTURE) ×2 IMPLANT
SUT SILK 2 0 TIES 10X30 (SUTURE) ×3 IMPLANT
SUT SILK 2-0 18XBRD TIE 12 (SUTURE) IMPLANT
SUT SILK 3 0 TIES 10X30 (SUTURE) ×2 IMPLANT
SWAB COLLECTION DEVICE MRSA (MISCELLANEOUS) IMPLANT
SYRINGE TOOMEY DISP (SYRINGE) ×1 IMPLANT
TAPE UMBILICAL 1/8 X36 TWILL (MISCELLANEOUS) ×2 IMPLANT
TOWEL OR 17X24 6PK STRL BLUE (TOWEL DISPOSABLE) ×3 IMPLANT
TOWEL OR 17X26 10 PK STRL BLUE (TOWEL DISPOSABLE) ×6 IMPLANT
TUBE ANAEROBIC SPECIMEN COL (MISCELLANEOUS) IMPLANT
TUBE CONNECTING 12'X1/4 (SUCTIONS) ×1
TUBE CONNECTING 12X1/4 (SUCTIONS) ×2 IMPLANT
WATER STERILE IRR 1000ML POUR (IV SOLUTION) IMPLANT
YANKAUER SUCT BULB TIP NO VENT (SUCTIONS) ×3 IMPLANT

## 2014-02-11 NOTE — Progress Notes (Signed)
NEURO HOSPITALIST PROGRESS NOTE   SUBJECTIVE:                                                                                                                        Chart reviewed and events of the 24 hours noted. Family has decided to withdraw care and allow organ donation. No further seizures noted.  On high dose propofol and C-EEG showing marked burst suppression.    OBJECTIVE:                                                                                                                           Vital signs in last 24 hours: Temp:  [97.1 F (36.2 C)-99.7 F (37.6 C)] 97.5 F (36.4 C) (03/26 0600) Pulse Rate:  [50-96] 52 (03/26 0853) Resp:  [18-27] 22 (03/26 0853) BP: (81-183)/(41-93) 158/72 mmHg (03/26 0853) SpO2:  [94 %-100 %] 98 % (03/26 0853) Arterial Line BP: (41-240)/(21-92) 168/76 mmHg (03/26 0600) FiO2 (%):  [30 %-40 %] 30 % (03/26 0854)  Intake/Output from previous day: 03/25 0701 - 03/26 0700 In: 3403.5 [I.V.:3098.5; NG/GT:30; IV Piggyback:275] Out: 275 [Urine:25; Emesis/NG output:250] Intake/Output this shift:   Nutritional status: NPO  Past Medical History  Diagnosis Date  . Hypertension   . Bipolar disorder   . Schizophrenia     [facility]  . Valvular heart disease     a. 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 b. 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  Marland Kitchen. Hyperlipidemia   . H/O: suicide attempt 1998    [facility]  . Anemia     Hx of iron infusions  . Gastroparesis   . Arthritis   . Cocaine abuse     per pt none x 3 yrs however continues to "handle" it and deal it with + UDS.  Marland Kitchen. Anxiety   . CHF (congestive heart failure)     a. EF 25-30% by echo 12/02/12, but 55% by TEE 12/04/12.  Marland Kitchen. GERD (gastroesophageal reflux disease)   . Depression   . Vertigo   . Itchy skin   . Peripheral neuropathy     BLE  . Bilateral lower  abdominal cramping   .  Shortness of breath     "periodically; happens at any time" (03/30/13)  . Generalized headaches     "monthly" (03/30/2013)  . Chronic kidney disease     has fistula, not on HD; "go to [facility] q 90 days to have kidneys checked out" (03/30/2013)  . Atrial tachycardia     a. dx 11/2012: tx with amiodarone, cardioversion (discharge rhythm: likely junctional).    Neurologic Exam:  Patient on high dose propofol, will defer neuro-exam.  Lab Results: Lab Results  Component Value Date/Time   CHOL 152 06/10/2013  4:45 PM   Lipid Panel No results found for this basename: CHOL, TRIG, HDL, CHOLHDL, VLDL, LDLCALC,  in the last 72 hours  Studies/Results: Dg Chest Port 1 View  02/15/2014   CLINICAL DATA:  Check endotracheal tube  EXAM: PORTABLE CHEST - 1 VIEW  COMPARISON:  02/10/2014  FINDINGS: The endotracheal tube is again well visualized measuring 5.3 cm above the carina. A left-sided central venous line 80 and nasogastric catheter are seen. Postsurgical changes are again identified. Mild interstitial edema is seen increased from the prior exam. No focal confluent infiltrate is noted.  IMPRESSION: Tubes and lines as described.  Slight increase in the degree of interstitial edema bilaterally.   Electronically Signed   By: Alcide Clever M.D.   On: 02/10/2014 07:53   Dg Chest Port 1 View  02/10/2014   CLINICAL DATA:  Endotracheal tube.  Shortness of breath.  EXAM: PORTABLE CHEST - 1 VIEW  COMPARISON:  DG CHEST 1V PORT dated 02/09/2014  FINDINGS: Endotracheal tube terminates 4.1 cm above carina. Nasogastric tube extends beyond the inferior aspect of the film. Left internal jugular line terminates at mid SVC.  Midline trachea. Mild cardiomegaly. Probable layering small left pleural effusion. Prior median sternotomy. No pneumothorax. Minimal improvement in mild interstitial edema. Patchy left infrahilar airspace disease.  IMPRESSION: Further improvement in mild interstitial edema.   Suspect developing left infrahilar atelectasis.   Electronically Signed   By: Jeronimo Greaves M.D.   On: 02/10/2014 08:43    MEDICATIONS                                                                                                                       I have reviewed the patient's current medications.  ASSESSMENT/PLAN:                                                                                                           53 y.o. male with a history of heart disease, suicide attempt, drug  abuse who presents with cardiac arrest who underwent cooling. He had approximately 20 minutes of CPR at home and 20 minutes of CPR in the ER before return of spontaneous circulation.  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 Very poor prognosis and family has decided to withdraw care and allow organ donation. Will D/C continuous EEG monitoring. Please, call neurology with any questions or concerns.      Ryan Portela, MD [facility] [phone]  01/17/2014, 9:17 AM

## 2014-02-11 NOTE — Progress Notes (Signed)
LTM EEG D/C'd per Dr Camilo 

## 2014-02-11 NOTE — Progress Notes (Signed)
PULMONARY / CRITICAL CARE MEDICINE   Name: Ryan Winters MRN: [id number] DOB: Apr 18, 1961    ADMISSION DATE:  02/11/2014  REFERRING MD :  Dr. Rhunette [name]  PRIMARY SERVICE: PCCM  CHIEF COMPLAINT:  Cardiac Arrest   BRIEF PATIENT DESCRIPTION: 53 y/o M admitted on 3/23 after witnessed cardiac arrest at home.  Greater than 20 min CPR at home & approx 20 min in ER (arrest after arrival).    SIGNIFICANT EVENTS / STUDIES:  3/23 - admit with witnessed cardiac arrest.  Initial rhythm per EMS was asystole, had return of shockable rhythm, epi x 2 - total of ~ 20 min.  Tx to ER, upon arrival to ER was profoundly bradhycardic with 2nd arrest.  Bicarb x2, epi x3 with approx 20 min of CPR.  ER eval noted UDS + for cocaine.   3/23 - ECHO>>>  LINES / TUBES: OETT 3/23>>> L Rad Aline 3/23>>> L IJ TLC 3/23>>>  CULTURES: None  ANTIBIOTICS: None  SUBJECTIVE: No events overnight  VITAL SIGNS: Temp:  [97.1 F (36.2 C)-98.6 F (37 C)] 97.3 F (36.3 C) (03/26 1000) Pulse Rate:  [50-96] 52 (03/26 1000) Resp:  [18-27] 22 (03/26 1000) BP: (81-183)/(44-93) 158/72 mmHg (03/26 0853) SpO2:  [97 %-100 %] 98 % (03/26 1000) Arterial Line BP: (41-240)/(21-92) 168/78 mmHg (03/26 1000) FiO2 (%):  [30 %-40 %] 30 % (03/26 0854)  HEMODYNAMICS: CVP:  [11 mmHg-20 mmHg] 20 mmHg  VENTILATOR SETTINGS: Vent Mode:  [-] PRVC FiO2 (%):  [30 %-40 %] 30 % Set Rate:  [22 bmp] 22 bmp Vt Set:  [550 mL] 550 mL PEEP:  [8 cmH20] 8 cmH20 Plateau Pressure:  [20 cmH20-25 cmH20] 23 cmH20  INTAKE / OUTPUT: Intake/Output     03/25 0701 - 03/26 0700 03/26 0701 - 03/27 0700   I.V. (mL/kg) 3098.5 (41.7) 217.7 (2.9)   NG/GT 30    IV Piggyback 275 105   Total Intake(mL/kg) 3403.5 (45.8) 322.7 (4.3)   Urine (mL/kg/hr) 25 (0)    Emesis/NG output 250 (0.1)    Total Output 275 0   Net +3128.5 +322.7        Stool Occurrence 1 x     PHYSICAL EXAMINATION: General:  wdwn adult male on vent Neuro:  Pupils 4mm NR, no gag or  cough, no spontaneous movement, no response to painful stimuli HEENT:  Mm pink/moist, OETT, jvd noted Cardiovascular:  s1s2 rrr, brady Lungs:  resp's even/non-labored, lungs bilaterally clear Abdomen:  Flat, soft, bsx4 active Musculoskeletal:  No acute deformities Skin:  Warm/dry, no edema, ? LUE fistula  LABS:  CBC  Recent Labs Lab 02/09/14 0530  02/09/14 1304 02/10/14 0420 01/26/2014 0230  WBC 17.7*  --   --  14.9* 16.8*  HGB 13.5  < > 13.6 11.7* 10.2*  HCT 39.2  < > 40.0 33.9* 29.5*  PLT 113*  --   --  85* 113*  < > = values in this interval not displayed. Coag's  Recent Labs Lab 01/30/2014 2254 02/09/14 0500 02/05/2014 0230  APTT 85* 83*  --   INR 1.49 1.58* 1.24   BMET  Recent Labs Lab 02/10/14 0800 02/10/14 1154 01/23/2014 0230  NA 144 143 141  K 5.0 5.4* 5.5*  CL 106 105 104  CO2 19 19 18*  BUN 68* 71* 80*  CREATININE 4.77* 5.23* 6.36*  GLUCOSE 91 106* 123*   Electrolytes  Recent Labs Lab 02/10/14 0420 02/10/14 0800 02/10/14 1154 02/16/2014 0230  CALCIUM 8.9 8.6 8.5 7.8*  MG  2.1  --   --  2.0  PHOS 5.8*  --   --  6.2*   Sepsis Markers  Recent Labs Lab 01/20/2014 1857  LATICACIDVEN 11.07*   ABG  Recent Labs Lab 02/09/14 0446 02/10/14 0432 August 29, 2014 0237  PHART 7.423 7.395 7.306*  PCO2ART 32.4* 31.9* 40.1  PO2ART 138.0* 143.0* 142.0*   Liver Enzymes  Recent Labs Lab 01/29/2014 1848 02/09/14 1400 August 29, 2014 0230  AST 39* 110* 70*  ALT 20 52 51  ALKPHOS 159* 149* 109  BILITOT 0.7 2.1* 1.2  ALBUMIN 3.1* 2.9* 2.4*   Cardiac Enzymes  Recent Labs Lab 02/01/2014 2156 02/09/14 0400 02/09/14 0940  TROPONINI 1.58* 2.06* 1.16*   Glucose  Recent Labs Lab 02/10/14 0723 02/10/14 1154 02/10/14 1642 02/10/14 2029 02/10/14 2329 August 29, 2014 0414  GLUCAP 81 102* 102* 117* 128* 123*    Imaging Dg Chest Port 1 View  05/28/2014   CLINICAL DATA:  Check endotracheal tube  EXAM: PORTABLE CHEST - 1 VIEW  COMPARISON:  02/10/2014  FINDINGS: The  endotracheal tube is again well visualized measuring 5.3 cm above the carina. A left-sided central venous line 80 and nasogastric catheter are seen. Postsurgical changes are again identified. Mild interstitial edema is seen increased from the prior exam. No focal confluent infiltrate is noted.  IMPRESSION: Tubes and lines as described.  Slight increase in the degree of interstitial edema bilaterally.   Electronically Signed   By: Alcide CleverMark  Lukens M.D.   On: 007/08/2014 07:53   Dg Chest Port 1 View  02/10/2014   CLINICAL DATA:  Endotracheal tube.  Shortness of breath.  EXAM: PORTABLE CHEST - 1 VIEW  COMPARISON:  DG CHEST 1V PORT dated 02/09/2014  FINDINGS: Endotracheal tube terminates 4.1 cm above carina. Nasogastric tube extends beyond the inferior aspect of the film. Left internal jugular line terminates at mid SVC.  Midline trachea. Mild cardiomegaly. Probable layering small left pleural effusion. Prior median sternotomy. No pneumothorax. Minimal improvement in mild interstitial edema. Patchy left infrahilar airspace disease.  IMPRESSION: Further improvement in mild interstitial edema.  Suspect developing left infrahilar atelectasis.   Electronically Signed   By: Jeronimo GreavesKyle  Talbot M.D.   On: 02/10/2014 08:43   ASSESSMENT / PLAN:  PULMONARY A: Acute Respiratory Failure - in setting of cardiac arrest Respiratory Acidosis P:   - Full vent support until donation   CARDIOVASCULAR A:  Cardiac Arrest - initial rhythm asystole, approx 40 min total CPR, Cocaine POSITIVE on admit.  ECHO 03/2013 with EF 50-55%, nml wall motion, PAS 46 Bradycardia  Shock - post arrest CHF Hx MVR / TVR - ? Tissue valves Cocaine Abuse Hx Malignant HTN Hx Atrial Flutter P:  - Pressor support until donation  RENAL A:   Lactic acidosis CKD - baseline cr ~ 3.1, followed by Dr. Arrie Aranoladonato Hyperkalemia in setting of acidosis, virtually aneuric at this point, K down to 5.0 Essentially aneuric at this point. P:   - D/C further  blood draws.  GASTROINTESTINAL A:   NPO P:   - D/C TF.  HEMATOLOGIC A:   Thrombocytopenia  P:  - D/C further blood draws.  INFECTIOUS A:   No evidence of acute infectious process P:   - No change  ENDOCRINE A:   Hyperglycemia   P:   - SSI  NEUROLOGIC A:   Bipolar Disorder Schizophrenia Peripheral Neuropathy Induced Hypothermia  P:   - Severe anoxia, family wishes him to be a donor and to make sure is comfortable.  Spoke with  wife, aware of poor prognosis, wishes patient to be a donor, spoke with [place] donor, likely donation at 7 PM tonight, will support til then.  I have personally obtained a history, examined the patient, evaluated laboratory and imaging results, formulated the assessment and plan and placed orders.  CRITICAL CARE: The patient is critically ill with multiple organ systems failure and requires high complexity decision making for assessment and support, frequent evaluation and titration of therapies, application of advanced monitoring technologies and extensive interpretation of multiple databases. Critical Care Time devoted to patient care services described in this note is 35 minutes.   Alyson Reedy, M.D. [facility] Pulmonary/Critical Care Medicine. Pager: [phone]. After hours pager: [phone].

## 2014-02-11 NOTE — Progress Notes (Signed)
Fentanyl waste 15cc and versed waste 15 cc.

## 2014-02-11 NOTE — Procedures (Signed)
Long Term 20 hour video EEG Monitoring  02/10/2014 to Dec 07, 2013  HPI: This is a 53 year old male who was admitted with a witnessed cardiac arrest with CPR for greater than 20 minutes. Patient required therapeutic hypothermia with persistent obtundation.  Medications: Unknown  Technique: This was a 17 channel 20 hour continuous video  EEG monitoring with 16 channels of EEG and 1 channel of EKG which was performed during a state of coma. Neither photic stimulation nor hyperventilation were performed as activating procedures.  Description: As the tracing opens there was noted to be a complete absence of normal background. The background was characterized by generalized 1 to 2 Hertz sharp waves which were continuous at the beginning of the recording.  Around 1 pm on 02/10/2014, the background was replaced by periods of background suppression lasting up to 17 seconds in duration  with periodic bursts of generalized sharp waves lasting 1 to 5 seconds in duration. There was no normal sleep architecture noted. Neither photic stimulation nor hyperventilation were performed as activating procedures.  Impression: This was a severely abnormal 20 hour video EEG monitoring as demonstrated by the presence of some generalized  1 to 2 Hertz rhythmic sharp waves replaced by bursts of periodic sharp waves followed by periods of suppression lasting up to 17 seconds in duration.  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 Tracing improved as the recording continued but was still very abnormal. Dr. Cyril Mourningamillo was notified of the findings at the time of the reading.   Di Kindlehristopher S. Glori Luisonnelly M.D.

## 2014-02-11 NOTE — Progress Notes (Signed)
eLink Physician-Brief Progress Note Patient Name: Ryan Winters DOB: Jun 15, 1961 MRN: [id number]  Date of Service  01/19/2014   HPI/Events of Note   Notified by [facility] that family has decided to withdraw care and to allow organ donation. Plan will be for appropriate screening eval and then probably to OR for extubation 3/26.   eICU Interventions  - order cx's - order Type and screen with subtyping in duplicate      [name] S. 01/31/2014, 1:57 AM

## 2014-02-11 NOTE — Progress Notes (Signed)
Nutrition Brief Note  Chart reviewed. Pt now transitioning to comfort care. Family planning to allow for organ donation.  No further nutrition interventions warranted at this time.  Please re-consult as needed.   Loyce DysKacie Venesha Petraitis, MS RD LDN Clinical Inpatient Dietitian Pager: [phone] Weekend/After hours pager: [phone]

## 2014-02-12 ENCOUNTER — Encounter (HOSPITAL_COMMUNITY): Payer: Self-pay

## 2014-02-12 MED ORDER — 0.9 % SODIUM CHLORIDE (POUR BTL) OPTIME
TOPICAL | Status: DC | PRN
  Administered 2014-02-12: 4000 mL

## 2014-02-13 LAB — URINE CULTURE: Colony Count: >=100000

## 2014-02-16 ENCOUNTER — Encounter: Payer: Self-pay | Admitting: Internal Medicine

## 2014-02-17 LAB — CULTURE, BLOOD (ROUTINE X 2)
CULTURE: NO GROWTH
Culture: NO GROWTH

## 2014-02-17 NOTE — Discharge Summary (Addendum)
[name]:  Ryan Winters, Ryan Winters             ACCOUNT NO.:  [id number]  MEDICAL RECORD NO.:  [id number]  LOCATION:  [facility]                         FACILITY:  [facility]  PHYSICIAN:  Felipa EvenerWesam Jake Yacoub, MD  DATE OF BIRTH:  1961-09-16  DATE OF ADMISSION:  01/25/2014 DATE OF DISCHARGE:  02/05/2014                              DISCHARGE SUMMARY   DEATH SUMMARY.  PRIMARY DIAGNOSES/CAUSE OF DEATH:  Pulseless electrical activity, cardiac arrest.  SECONDARY DIAGNOSES: 1. Acute respiratory failure. 2. Congestive heart failure. 3. Respiratory acidosis. 4. Bradycardia. 5. Cardiogenic shock. 6. Cocaine abuse. 7. Borderline hypertension. 8. Atrial flutter. 9. Lactic acidosis. 10.Thrombocytopenia. 11.Shocked liver.  The patient was a 53 year old male with a past medical significant for cocaine abuse who was witnessed arrest at home.  EMS was called.  The patient underwent CPR for 20 minutes at home and another 20 minutes in the emergency department and returned to spontaneous circulation.  The patient was brought to the emergency department and transferred to the Coronary Care Unit per hypothermia protocol.  After undergoing hypothermia protocol, paralytics was started, so the patient stopped rather and sedation was stopped.  The patient's arterial manifestation of severe anoxic injury.  Neurology consultation was called who concurred that the patient has severe anoxic injury.  I had a conversation with family who were made aware of the poor prognosis who informed me that the patient would wish to be a donor, which was deferred to conversation between the family and [facility].  Over at that point, decision was made to make the patient a full no code blue and start morphine for the patient to be extubated and expired shortly thereafter with the family at bedside.     Felipa EvenerWesam Jake Yacoub, MD     WJY/MEDQ  D:  02/16/2014  T:  02/16/2014  Job:  [id number]

## 2014-02-17 NOTE — OR Nursing (Signed)
Time of death is 00:45

## 2014-02-17 NOTE — Progress Notes (Signed)
Pt transferred to OR at 2315. Pt extubated at 0020 by RT. Pt passed at 0040 two RNs, Rivka SpringKaylee Swanson and [employer] Tomlison, auscultated  for 1 minute. No heart or lung sounds noted. Pt's family was notified and at bedside.

## 2014-02-17 NOTE — Procedures (Signed)
Extubation Procedure Note  Patient Details:   Name: Ryan Winters DOB: 1961/01/05 MRN: [id number]   Airway Documentation:  Airway 8 mm (Active)  Secured at (cm) 24 cm 01/24/2014  7:38 PM  Measured From Lips 02/10/2014  7:38 PM  Secured Location Right 02/05/2014  7:38 PM  Secured By [employer] Tube Holder 02/07/2014  7:38 PM  Tube Holder Repositioned Yes 02/05/2014  7:38 PM  Cuff Pressure (cm H2O) 24 cm H2O 02/09/2014 11:38 AM  Site Condition Dry 01/28/2014  7:38 PM    Evaluation  O2 sats: stable throughout Complications: No apparent complications Patient did tolerate procedure well. Bilateral Breath Sounds: Diminished Suctioning: Airway No  Pt. Was terminally extubated in the OR.   Nakari Bracknell L 2014-04-11, 2:10 AM

## 2014-02-17 DEATH — deceased

## 2014-02-23 ENCOUNTER — Encounter: Payer: Self-pay | Admitting: Internal Medicine

## 2014-04-28 ENCOUNTER — Other Ambulatory Visit (HOSPITAL_COMMUNITY): Payer: Self-pay

## 2014-05-05 ENCOUNTER — Ambulatory Visit: Payer: Self-pay | Admitting: Cardiovascular Disease

## 2014-10-28 ENCOUNTER — Encounter (HOSPITAL_COMMUNITY): Payer: Self-pay | Admitting: Cardiovascular Disease
